# Patient Record
Sex: Male | Born: 1935
Health system: Southern US, Community
[De-identification: ages and names within clinical notes are randomized; demographics above are authoritative.]

## PROBLEM LIST (undated history)

## (undated) DIAGNOSIS — Z87898 Personal history of other specified conditions: Secondary | ICD-10-CM

## (undated) DIAGNOSIS — Z8781 Personal history of (healed) traumatic fracture: Secondary | ICD-10-CM

## (undated) DIAGNOSIS — I872 Venous insufficiency (chronic) (peripheral): Secondary | ICD-10-CM

## (undated) DIAGNOSIS — E538 Deficiency of other specified B group vitamins: Secondary | ICD-10-CM

## (undated) DIAGNOSIS — M1990 Unspecified osteoarthritis, unspecified site: Secondary | ICD-10-CM

## (undated) DIAGNOSIS — C61 Malignant neoplasm of prostate: Secondary | ICD-10-CM

## (undated) DIAGNOSIS — C4491 Basal cell carcinoma of skin, unspecified: Secondary | ICD-10-CM

## (undated) DIAGNOSIS — I700 Atherosclerosis of aorta: Secondary | ICD-10-CM

## (undated) DIAGNOSIS — M40203 Unspecified kyphosis, cervicothoracic region: Secondary | ICD-10-CM

## (undated) DIAGNOSIS — K635 Polyp of colon: Secondary | ICD-10-CM

## (undated) DIAGNOSIS — K5909 Other constipation: Secondary | ICD-10-CM

## (undated) HISTORY — DX: Polyp of colon: K63.5

## (undated) HISTORY — PX: FRACTURE SURGERY: SHX138

## (undated) HISTORY — DX: Malignant neoplasm of prostate: C61

## (undated) HISTORY — PX: CATARACT EXTRACTION: SUR2

## (undated) HISTORY — DX: Unspecified osteoarthritis, unspecified site: M19.90

## (undated) HISTORY — PX: COLONOSCOPY: SHX174

## (undated) HISTORY — DX: Atherosclerosis of aorta: I70.0

## (undated) HISTORY — DX: Venous insufficiency (chronic) (peripheral): I87.2

## (undated) HISTORY — DX: Personal history of other specified conditions: Z87.898

## (undated) HISTORY — PX: OTHER SURGICAL HISTORY: SHX169

## (undated) HISTORY — DX: Personal history of (healed) traumatic fracture: Z87.81

## (undated) HISTORY — DX: Deficiency of other specified B group vitamins: E53.8

## (undated) HISTORY — DX: Other constipation: K59.09

---

## 1994-02-24 DIAGNOSIS — C61 Malignant neoplasm of prostate: Secondary | ICD-10-CM

## 1994-02-24 HISTORY — DX: Malignant neoplasm of prostate: C61

## 1994-02-24 HISTORY — PX: PROSTATE SURGERY: SHX751

## 1997-02-24 HISTORY — PX: TIBIA FRACTURE SURGERY: SHX806

## 2002-02-24 DIAGNOSIS — C4491 Basal cell carcinoma of skin, unspecified: Secondary | ICD-10-CM

## 2002-02-24 HISTORY — DX: Basal cell carcinoma of skin, unspecified: C44.91

## 2002-02-24 HISTORY — PX: BASAL CELL CARCINOMA EXCISION: SHX1214

## 2003-02-25 HISTORY — PX: JOINT REPLACEMENT: SHX530

## 2008-01-04 ENCOUNTER — Encounter: Payer: Self-pay | Admitting: Internal Medicine

## 2008-08-24 ENCOUNTER — Encounter: Payer: Self-pay | Admitting: Internal Medicine

## 2008-08-24 ENCOUNTER — Ambulatory Visit: Payer: Self-pay | Admitting: Internal Medicine

## 2008-09-05 DIAGNOSIS — Z860100 Personal history of colon polyps, unspecified: Secondary | ICD-10-CM | POA: Insufficient documentation

## 2008-09-05 DIAGNOSIS — M810 Age-related osteoporosis without current pathological fracture: Secondary | ICD-10-CM

## 2008-09-05 DIAGNOSIS — M159 Polyosteoarthritis, unspecified: Secondary | ICD-10-CM | POA: Insufficient documentation

## 2008-09-07 ENCOUNTER — Encounter: Payer: Self-pay | Admitting: Internal Medicine

## 2008-09-07 ENCOUNTER — Ambulatory Visit: Payer: Self-pay | Admitting: Internal Medicine

## 2008-09-07 DIAGNOSIS — K5909 Other constipation: Secondary | ICD-10-CM

## 2008-09-07 DIAGNOSIS — I872 Venous insufficiency (chronic) (peripheral): Secondary | ICD-10-CM

## 2008-09-12 ENCOUNTER — Encounter: Payer: Self-pay | Admitting: Internal Medicine

## 2008-09-12 ENCOUNTER — Ambulatory Visit: Payer: Self-pay | Admitting: Internal Medicine

## 2008-09-24 ENCOUNTER — Ambulatory Visit: Payer: Self-pay | Admitting: Internal Medicine

## 2008-11-30 ENCOUNTER — Ambulatory Visit: Payer: Self-pay | Admitting: Internal Medicine

## 2008-11-30 ENCOUNTER — Encounter: Payer: Self-pay | Admitting: Internal Medicine

## 2008-12-11 ENCOUNTER — Ambulatory Visit: Payer: Self-pay | Admitting: Internal Medicine

## 2008-12-13 ENCOUNTER — Encounter: Payer: Self-pay | Admitting: Internal Medicine

## 2008-12-13 ENCOUNTER — Ambulatory Visit: Payer: Self-pay | Admitting: Internal Medicine

## 2008-12-13 DIAGNOSIS — M1710 Unilateral primary osteoarthritis, unspecified knee: Secondary | ICD-10-CM

## 2008-12-25 ENCOUNTER — Ambulatory Visit: Payer: Self-pay | Admitting: Internal Medicine

## 2009-01-24 HISTORY — PX: VOLVULUS REDUCTION: SHX425

## 2009-02-04 ENCOUNTER — Inpatient Hospital Stay: Payer: Self-pay | Admitting: General Surgery

## 2009-02-04 ENCOUNTER — Ambulatory Visit: Payer: Self-pay | Admitting: Internal Medicine

## 2009-02-05 ENCOUNTER — Encounter: Payer: Self-pay | Admitting: Internal Medicine

## 2009-02-08 ENCOUNTER — Telehealth: Payer: Self-pay | Admitting: Internal Medicine

## 2009-02-15 ENCOUNTER — Ambulatory Visit: Payer: Self-pay | Admitting: Internal Medicine

## 2009-02-19 ENCOUNTER — Ambulatory Visit: Payer: Self-pay | Admitting: Internal Medicine

## 2009-02-24 ENCOUNTER — Ambulatory Visit: Payer: Self-pay | Admitting: Internal Medicine

## 2009-02-26 ENCOUNTER — Encounter: Payer: Self-pay | Admitting: Internal Medicine

## 2009-03-13 ENCOUNTER — Ambulatory Visit: Payer: Self-pay | Admitting: Internal Medicine

## 2009-03-21 ENCOUNTER — Telehealth: Payer: Self-pay | Admitting: Internal Medicine

## 2009-03-22 ENCOUNTER — Encounter: Payer: Self-pay | Admitting: Internal Medicine

## 2009-03-22 ENCOUNTER — Ambulatory Visit: Payer: Self-pay | Admitting: Internal Medicine

## 2009-03-22 DIAGNOSIS — C61 Malignant neoplasm of prostate: Secondary | ICD-10-CM

## 2009-03-27 ENCOUNTER — Ambulatory Visit: Payer: Self-pay | Admitting: Internal Medicine

## 2009-03-29 ENCOUNTER — Encounter: Payer: Self-pay | Admitting: Internal Medicine

## 2009-03-29 ENCOUNTER — Ambulatory Visit: Payer: Self-pay | Admitting: Internal Medicine

## 2009-03-30 ENCOUNTER — Ambulatory Visit: Payer: Self-pay | Admitting: Internal Medicine

## 2009-04-05 ENCOUNTER — Encounter: Payer: Self-pay | Admitting: Internal Medicine

## 2009-06-25 ENCOUNTER — Telehealth: Payer: Self-pay | Admitting: Internal Medicine

## 2009-07-12 ENCOUNTER — Ambulatory Visit: Payer: Self-pay | Admitting: Internal Medicine

## 2009-07-12 ENCOUNTER — Encounter: Payer: Self-pay | Admitting: Internal Medicine

## 2009-08-24 ENCOUNTER — Ambulatory Visit: Payer: Self-pay | Admitting: Internal Medicine

## 2009-09-10 ENCOUNTER — Ambulatory Visit: Payer: Self-pay | Admitting: Internal Medicine

## 2009-09-10 ENCOUNTER — Encounter: Payer: Self-pay | Admitting: Internal Medicine

## 2009-09-11 LAB — PSA

## 2009-09-24 ENCOUNTER — Ambulatory Visit: Payer: Self-pay | Admitting: Internal Medicine

## 2009-10-11 ENCOUNTER — Ambulatory Visit: Payer: Self-pay | Admitting: Internal Medicine

## 2009-10-11 ENCOUNTER — Encounter: Payer: Self-pay | Admitting: Internal Medicine

## 2009-12-10 ENCOUNTER — Encounter: Payer: Self-pay | Admitting: Internal Medicine

## 2009-12-11 ENCOUNTER — Ambulatory Visit: Payer: Self-pay | Admitting: Internal Medicine

## 2009-12-11 ENCOUNTER — Telehealth: Payer: Self-pay | Admitting: Internal Medicine

## 2009-12-12 LAB — PSA

## 2009-12-13 ENCOUNTER — Ambulatory Visit: Payer: Self-pay | Admitting: Internal Medicine

## 2009-12-13 ENCOUNTER — Encounter: Payer: Self-pay | Admitting: Internal Medicine

## 2009-12-13 DIAGNOSIS — E538 Deficiency of other specified B group vitamins: Secondary | ICD-10-CM

## 2009-12-25 ENCOUNTER — Ambulatory Visit: Payer: Self-pay | Admitting: Internal Medicine

## 2010-02-11 ENCOUNTER — Encounter: Payer: Self-pay | Admitting: Internal Medicine

## 2010-02-14 ENCOUNTER — Encounter: Payer: Self-pay | Admitting: Internal Medicine

## 2010-02-14 DIAGNOSIS — M549 Dorsalgia, unspecified: Secondary | ICD-10-CM | POA: Insufficient documentation

## 2010-02-20 ENCOUNTER — Encounter: Payer: Self-pay | Admitting: Family Medicine

## 2010-02-22 ENCOUNTER — Telehealth: Payer: Self-pay | Admitting: Family Medicine

## 2010-02-23 ENCOUNTER — Ambulatory Visit: Payer: Self-pay | Admitting: Family Medicine

## 2010-02-23 ENCOUNTER — Encounter: Payer: Self-pay | Admitting: Family Medicine

## 2010-02-23 ENCOUNTER — Telehealth: Payer: Self-pay | Admitting: Family Medicine

## 2010-03-05 ENCOUNTER — Encounter: Payer: Self-pay | Admitting: Family Medicine

## 2010-03-05 ENCOUNTER — Ambulatory Visit: Payer: Self-pay | Admitting: Family Medicine

## 2010-03-07 ENCOUNTER — Encounter: Payer: Self-pay | Admitting: Internal Medicine

## 2010-03-07 DIAGNOSIS — I8310 Varicose veins of unspecified lower extremity with inflammation: Secondary | ICD-10-CM | POA: Insufficient documentation

## 2010-03-13 ENCOUNTER — Ambulatory Visit: Payer: Self-pay | Admitting: Internal Medicine

## 2010-03-14 LAB — PSA

## 2010-03-26 NOTE — Assessment & Plan Note (Signed)
Summary: [facility]   Vital Signs:  Patient profile:   77 year old male Weight:      159 pounds Temp:     98.1 degrees F Pulse rate:   72 / minute Resp:     18 per minute BP sitting:   118 / 60 CC: Assisted living follow up   History of Present Illness: Had volvulus repair last month Several weeks in rehab and now back in AL apt No abdominal pain Appetite is good 1-2 stools per day No blood in stool No nausea or vomiting Not needing the percocet  Occ arthritis pain in hands and right knee ?steroid injection helpful at all He can feel things hitting in there  Still gets lupron for prostate cancer Treated by Dr Sherrlyn Hock on osteoporosis regimen as well  Allergies: No Known Drug Allergies  Past History:  Past medical, surgical, family and social histories (including risk factors) reviewed, and no changes noted (except as noted below).  Past Medical History: Reviewed history from 09/07/2008 and no changes required. Osteoporosis Prostate cancer, hx of-------------surgery then lupron shots Colonic polyps, hx of-----------------adenomatous Osteoarthritis Constipation--chronic Chronic venous insufficiency  Past Surgical History: Cataract surgery, both eyes- 2000 & 2003 Left knee replacement-2005 Colonoscopy-2003 & 2006 Carcinoma removed from left side of face- (BCC)--2004 Surgery on left leg, broken-1999 Prostate cancer surgery--1996 Volvulus repair--12/10  Family History: Reviewed history from 09/07/2008 and no changes required. Dad died of MI in 52's Mom died of leukemia  ~78 3 sisters---2 were half sisters No CAD, HTN DM strong in family (including son) No colon or prostate cancer  Social History: Reviewed history from 09/07/2008 and no changes required. 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 Has DNR and wishes to continue. Would  accept hospitalization.  Would not want tube feeding (09/07/2008)  Review of Systems  The patient denies chest pain, syncope, dyspnea on exertion, and abdominal pain.         sleeps okay--occ up helping wife  weight down from last AL visit but stable since transfer back here  Physical Exam  General:  alert and normal appearance.   Neck:  supple, no masses, no thyromegaly, no carotid bruits, and no cervical lymphadenopathy.   Lungs:  normal respiratory effort and normal breath sounds.   Heart:  normal rate, regular rhythm, no murmur, and no gallop.   Abdomen:  soft, non-tender, and normal bowel sounds.   Well healed midline lower abd incision Msk:  no joint tenderness and no joint swelling.   Extremities:  no edema Psych:  normally interactive, good eye contact, not anxious appearing, and not depressed appearing.     Impression & Recommendations:  Problem # 1:  INTESTINAL VOLVULUS (ICD-560.2) Assessment Improved has recovered well from the surgery eating fine--will watch weight needs to stay on miralax and probably metamucil also to decrease strain on bowels  Problem # 2:  OSTEOARTHRITIS (ICD-715.90) Assessment: Unchanged mild symptoms no meds in general   Problem # 3:  ADENOCARCINOMA, PROSTATE (ICD-185) Assessment: Comment Only still gets lupron shots as needed PSA staying down so hasn't needed lately  Problem # 4:  OSTEOPOROSIS (ICD-733.00) Assessment: Unchanged on appropriate regimen with high risk due to leuprolide Rx due for DEXA  His updated medication list for this problem includes:    Actonel 35 Mg Tabs (Risedronate sodium) .Marland Kitchen... Take 1 tablet by mouth each week    Calcium 600-d 600-400 Mg-unit Tabs (  Calcium carbonate-vitamin d) .Marland Kitchen... Take 2 by mouth once daily    Vitamin D 1000 Unit Tabs (Cholecalciferol) .Marland Kitchen... Take 1 by mouth once daily  Complete Medication List: 1)  Metamucil 30.9 % Powd (Psyllium) .... Take with full glass of water two times a day 2)   Miralax Powd (Polyethylene glycol 3350) .... Take with full glass of water two times a day 3)  Glucosamine-chondroitin 1500-1200 Mg/76ml Liqd (Glucosamine-chondroitin) .... Take 1 by mouth once daily 4)  Actonel 35 Mg Tabs (Risedronate sodium) .... Take 1 tablet by mouth each week 5)  Calcium 600-d 600-400 Mg-unit Tabs (Calcium carbonate-vitamin d) .... Take 2 by mouth once daily 6)  Vitamin D 1000 Unit Tabs (Cholecalciferol) .... Take 1 by mouth once daily 7)  Ciclopirox Olamine 0.77 % Crea (Ciclopirox olamine) .... Use 1 application daily between toes 8)  Hydrocortisone 2.5 % Oint (Hydrocortisone) .... Use 1 application daily on left ankle 9)  Px Enteric Aspirin 81 Mg Tbec (Aspirin) .... Take 1 by mouth once daily 10)  Eligard 22.5 Mg Kit (Leuprolide acetate (3 month)) .Marland Kitchen.. 1 injection every 6 months as needed  Patient Instructions: 1)  Will plan follow up here in 2-3 months Prescriptions: ACTONEL 35 MG TABS (RISEDRONATE SODIUM) take 1 tablet by mouth each week  #12 x 3   Entered and Authorized by:   Cindee Salt MD   Signed by:   Cindee Salt MD on 03/22/2009   Method used:   Handwritten   RxID:   [id number]

## 2010-03-26 NOTE — Assessment & Plan Note (Signed)
Summary: [facility]   Vital Signs:  Patient profile:   77 year old male Height:      73 inches Weight:      168 pounds BMI:     22.25 Temp:     97.9 degrees F Pulse rate:   68 / minute Resp:     18 per minute BP sitting:   98 / 58  History of Present Illness: Reviewed status with Carla Generally doing okay Busy with responsiblity of caring for ailing wife---she needs his help to stay here at assisted living BP running on low side He reports no dizziness or syncope  Had recent oncology eval no lupron this time No trouble voiding ----remains slow Nocturia x 2 in general  Some pain in fingers Limited grip -- esp on right Wants to try the piano again some--helps his dexterity RIght knee pain--not enough to need another cortisone injection Generally doesn't need meds  remains on the actonel and vitamin D Oncologist considering zometa     Allergies: No Known Drug Allergies  Past History:  Past medical, surgical, family and social histories (including risk factors) reviewed for relevance to current acute and chronic problems.  Past Medical History: Reviewed history from 09/07/2008 and no changes required. Osteoporosis Prostate cancer, hx of-------------surgery then lupron shots Colonic polyps, hx of-----------------adenomatous Osteoarthritis Constipation--chronic Chronic venous insufficiency  Past Surgical History: Reviewed history from 03/22/2009 and no changes required. Cataract surgery, both eyes- 2000 & 2003 Left knee replacement-2005 Colonoscopy-2003 & 2006 Carcinoma removed from left side of face- (BCC)--2004 Surgery on left leg, broken-1999 Prostate cancer surgery--1996 Volvulus repair--12/10  Family History: Reviewed history from 09/07/2008 and no changes required. Dad died of MI in 62's Mom died of leukemia  ~78 3 sisters---2 were half sisters No CAD, HTN DM strong in family (including son) No colon or prostate cancer  Social  History: Reviewed history from 09/07/2008 and no changes required. 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 Has DNR and wishes to continue. Would accept hospitalization.  Would not want tube feeding (09/07/2008)  Review of Systems       Appetite is fine weight is stable sleeps okay---feels he is sleeping lighter so he can get up to help wife as needed  If he has night problems, a little snack sometimes helps  Physical Exam  General:  alert and normal appearance.   Neck:  supple, no masses, no thyromegaly, no carotid bruits, and no cervical lymphadenopathy.   Lungs:  normal respiratory effort, no intercostal retractions, no accessory muscle use, and normal breath sounds.   Heart:  normal rate, regular rhythm, no murmur, and no gallop.   Abdomen:  soft and non-tender.   Msk:  no joint tenderness.  Moderate thckening of hand PIPs but not actively inflamedno edema Extremities:  No edema Psych:  normally interactive, good eye contact, not anxious appearing, and not depressed appearing.     Impression & Recommendations:  Problem # 1:  DEGENERATIVE JOINT DISEASE, BOTH KNEES, SEVERE (ICD-715.96) Assessment Improved helped by last cortisone shot hands are okay also  His updated medication list for this problem includes:    Px Enteric Aspirin 81 Mg Tbec (Aspirin) .Marland Kitchen... Take 1 by mouth once daily    Cyclobenzaprine Hcl 5 Mg Tabs (Cyclobenzaprine hcl) .Marland Kitchen... 1 tab at bedtime as needed for pain or spasm  Problem # 2:  ADENOCARCINOMA, PROSTATE (ICD-185) Assessment: Unchanged Dr Sherrlyn Hock managing no  recent lupron voids okay  Problem # 3:  OSTEOPOROSIS (ICD-733.00) Assessment: Unchanged stable on Rx  His updated medication list for this problem includes:    Actonel 35 Mg Tabs (Risedronate sodium) .Marland Kitchen... Take 1 tablet by mouth each week    Calcium 600-d 600-400 Mg-unit Tabs (Calcium  carbonate-vitamin d) .Marland Kitchen... Take 2 by mouth once daily    Vitamin D 1000 Unit Tabs (Cholecalciferol) .Marland Kitchen... Take 1 by mouth once daily  Problem # 4:  CONSTIPATION, CHRONIC (ICD-564.09) Assessment: Unchanged satisfied with regimen No changes needed  His updated medication list for this problem includes:    Metamucil 30.9 % Powd (Psyllium) .Marland Kitchen... Take with full glass of water two times a day    Miralax Powd (Polyethylene glycol 3350) .Marland Kitchen... Take with full glass of water two times a day  Complete Medication List: 1)  Metamucil 30.9 % Powd (Psyllium) .... Take with full glass of water two times a day 2)  Miralax Powd (Polyethylene glycol 3350) .... Take with full glass of water two times a day 3)  Glucosamine-chondroitin 1500-1200 Mg/20ml Liqd (Glucosamine-chondroitin) .... Take 1 by mouth once daily 4)  Actonel 35 Mg Tabs (Risedronate sodium) .... Take 1 tablet by mouth each week 5)  Calcium 600-d 600-400 Mg-unit Tabs (Calcium carbonate-vitamin d) .... Take 2 by mouth once daily 6)  Vitamin D 1000 Unit Tabs (Cholecalciferol) .... Take 1 by mouth once daily 7)  Ciclopirox Olamine 0.77 % Crea (Ciclopirox olamine) .... Use 1 application daily between toes 8)  Hydrocortisone 2.5 % Oint (Hydrocortisone) .... Use 1 application daily on left ankle 9)  Px Enteric Aspirin 81 Mg Tbec (Aspirin) .... Take 1 by mouth once daily 10)  Eligard 22.5 Mg Kit (Leuprolide acetate (3 month)) .Marland Kitchen.. 1 injection every 6 months as needed 11)  Cyclobenzaprine Hcl 5 Mg Tabs (Cyclobenzaprine hcl) .Marland Kitchen.. 1 tab at bedtime as needed for pain or spasm  Patient Instructions: 1)  Will plan follow up in 2-3 months

## 2010-03-26 NOTE — Assessment & Plan Note (Signed)
Summary: [facility]   Vital Signs:  Patient profile:   77 year old male Weight:      164 pounds Temp:     97.6 degrees F Pulse rate:   68 / minute Resp:     16 per minute BP sitting:   104 / 60 CC: [facility] follow up   History of Present Illness: Reviewed status with RN and resident assistenat  No new concerns  Having more trouble with right knee did have some relief from cortisone shot and requests another one today No other major worrisome joints Does have stiffness in hands and trouble making a fist--doesn't limit him functionally but only occ drops something. Hopes to get back to working on the piano  wife back in health care She has adjusted but hopes to get back here soon He has done okay with this also He brings her over for lunch and supper and that helps to normalize things for them somewhat  PSA has been staying done hasn't needed lupron in some time  Thinks he has been on actonel well more than 5 years continues on calcium and vitamin D still walks fairly well tries to keep up with exercise as well---stretching, bicycle (though that didn't feel great on knee)  Bowels have been "best they ever had" of late  No problems with edema no calf pain  Allergies: No Known Drug Allergies  Past History:  Past medical, surgical, family and social histories (including risk factors) reviewed for relevance to current acute and chronic problems.  Past Medical History: Reviewed history from 09/07/2008 and no changes required. Osteoporosis Prostate cancer, hx of-------------surgery then lupron shots Colonic polyps, hx of-----------------adenomatous Osteoarthritis Constipation--chronic Chronic venous insufficiency  Past Surgical History: Reviewed history from 03/22/2009 and no changes required. Cataract surgery, both eyes- 2000 & 2003 Left knee replacement-2005 Colonoscopy-2003 & 2006 Carcinoma removed from left side of face-  (BCC)--2004 Surgery on left leg, broken-1999 Prostate cancer surgery--1996 Volvulus repair--12/10  Family History: Reviewed history from 09/07/2008 and no changes required. Dad died of MI in 18's Mom died of leukemia  ~78 3 sisters---2 were half sisters No CAD, HTN DM strong in family (including son) No colon or prostate cancer  Social History: Reviewed history from 09/07/2008 and no changes required. 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 Has DNR and wishes to continue. Would accept hospitalization.  Would not want tube feeding (09/07/2008)  Review of Systems       appetite is "voracious" weight up 5# since last visit sleeps fairly well---occ has hot flashes Given Rx for venlafaxine for night for these but he had "strange night of sleep" so he never continued. They don't seem to be bad enough to continue Rx Increased nocturia lately but able to get back to sleep  Physical Exam  General:  alert and normal appearance.   Neck:  supple, no masses, no thyromegaly, no carotid bruits, and no cervical lymphadenopathy.   Lungs:  normal respiratory effort and normal breath sounds.   Heart:  normal rate, regular rhythm, no murmur, and no gallop.   Abdomen:  soft and non-tender.   Msk:  no joint swelling.   Right knee is heavily thickened  Fair ROM No apparent effusion Pulses:  faint in feet Extremities:  no sig edema Skin:  no rashes and no suspicious lesions.   Psych:  normally interactive, good eye contact, not anxious appearing,  and not depressed appearing.     Impression & Recommendations:  Problem # 1:  ADENOCARCINOMA, PROSTATE (ICD-185) Assessment Unchanged doing well hasn't needed lupron in some time  Problem # 2:  OSTEOPOROSIS (ICD-733.00) Assessment: Unchanged discussed concerns about prolonged bisphosphonate use he believes he has been on it much longer than  5 years will stop the actonel  His updated medication list for this problem includes:    Actonel 35 Mg Tabs (Risedronate sodium) .Marland Kitchen... Take 1 tablet by mouth each week    Calcium 600-d 600-400 Mg-unit Tabs (Calcium carbonate-vitamin d) .Marland Kitchen... Take 2 by mouth once daily    Vitamin D 1000 Unit Tabs (Cholecalciferol) .Marland Kitchen... Take 1 by mouth once daily  Problem # 3:  CONSTIPATION, CHRONIC (ICD-564.09) Assessment: Improved very satisfied with current regimen  His updated medication list for this problem includes:    Metamucil 30.9 % Powd (Psyllium) .Marland Kitchen... Take with full glass of water two times a day    Miralax Powd (Polyethylene glycol 3350) .Marland Kitchen... Take with full glass of water two times a day  Problem # 4:  VENOUS INSUFFICIENCY, CHRONIC (ICD-459.81) Assessment: Improved swelling better of late continues to wear support socks and tries to keep legs elevated when he can  Problem # 5:  DEGENERATIVE JOINT DISEASE, BOTH KNEES, SEVERE (ICD-715.96) Assessment: Comment Only Procedure sterile prep superomedial approach used 2cc 1% plain lido used for local moderate arthritc spurring encountered but not as bad as lateral approach 40mg  kenalog with 5 cc 1% lidocaine injected without difficulty he tolerated this well  Complete Medication List: 1)  Metamucil 30.9 % Powd (Psyllium) .... Take with full glass of water two times a day 2)  Miralax Powd (Polyethylene glycol 3350) .... Take with full glass of water two times a day 3)  Glucosamine-chondroitin 1500-1200 Mg/21ml Liqd (Glucosamine-chondroitin) .... Take 1 by mouth once daily 4)  Actonel 35 Mg Tabs (Risedronate sodium) .... Take 1 tablet by mouth each week 5)  Calcium 600-d 600-400 Mg-unit Tabs (Calcium carbonate-vitamin d) .... Take 2 by mouth once daily 6)  Vitamin D 1000 Unit Tabs (Cholecalciferol) .... Take 1 by mouth once daily 7)  Ciclopirox Olamine 0.77 % Crea (Ciclopirox olamine) .... Use 1 application daily between toes 8)   Hydrocortisone 2.5 % Oint (Hydrocortisone) .... Use 1 application daily on left ankle 9)  Px Enteric Aspirin 81 Mg Tbec (Aspirin) .... Take 1 by mouth once daily 10)  Eligard 22.5 Mg Kit (Leuprolide acetate (3 month)) .Marland Kitchen.. 1 injection every 6 months as needed 11)  Cyclobenzaprine Hcl 5 Mg Tabs (Cyclobenzaprine hcl) .Marland Kitchen.. 1 tab at bedtime as needed for pain or spasm  Patient Instructions: 1)  Will Plan follow up visit in 2-3 months

## 2010-03-26 NOTE — Letter (Signed)
Summary: [facility]  [facility]   Imported By: Lanelle Bal 04/18/2009 13:04:27  _____________________________________________________________________  External Attachment:    Type:   Image     Comment:   External Document  Appended Document: [facility] doing fine 2 months after volvulus repair follow up as needed

## 2010-03-26 NOTE — Letter (Signed)
Summary: [facility]  [facility]   Imported By: Maryln Gottron 10/05/2009 13:32:11  _____________________________________________________________________  External Attachment:    Type:   Image     Comment:   External Document  Appended Document: [facility] will repeat PSA and give more lupron if going up May need zometa if needs more androgen blockade

## 2010-03-26 NOTE — Progress Notes (Signed)
Summary: Bone density and f/u appt  Phone Note From Other Clinic Call back at [phone]   Caller: [facility] Call For: Dr. Alphonsus Sias Summary of Call: Revonda Standard called on behalf of Dr. Sherrlyn Hock.  Dr. Sherrlyn Hock would like Dr. Alphonsus Sias to refer patient to have a bone density to evaluate patient's osteoporosis and schedule a f/u appt with Dr. Alphonsus Sias.  Please advise. Initial call taken by: Linde Gillis CMA [facility]),  March 21, 2009 4:58 PM  Follow-up for Phone Call        I am seeing him tomorrow at [facility]. I will talk to him about the bone density Follow-up by: Cindee Salt MD,  March 21, 2009 7:03 PM  Additional Follow-up for Phone Call Additional follow up Details #1::        left message on voicemail for [facility] with results Additional Follow-up by: Mervin Hack CMA [facility]),  March 22, 2009 8:36 AM

## 2010-03-26 NOTE — Progress Notes (Signed)
Summary: b12 defficiency  Phone Note Other Incoming Call back at [phone]   Caller: Fulton Mole with [phone] Summary of Call: Patient was at the cancer center today and they checked his b12 level and it camed back at 177. They are asking if you would be willing to treat him for his b12 defficiency. Please advise.  Fulton Mole is aware that you are out of office for the afternoon.  Initial call taken by: Melody Comas,  December 11, 2009 3:36 PM  Follow-up for Phone Call        I will add this to his treatment at [facility]  Please call them to let them know I will handle this Follow-up by: Cindee Salt MD,  December 11, 2009 7:44 PM  Additional Follow-up for Phone Call Additional follow up Details #1::        left message on Alice machine that we will treat for the b-12 Additional Follow-up by: DeShannon Katrinka Blazing CMA [facility]),  December 12, 2009 9:36 AM

## 2010-03-26 NOTE — Miscellaneous (Signed)
Summary: Orders Update   Clinical Lists Changes  Orders: Added new Referral order of Radiology Referral (Radiology) - Signed 

## 2010-03-26 NOTE — Assessment & Plan Note (Signed)
Summary: [facility]   Vital Signs:  Patient profile:   77 year old male Weight:      168 pounds Temp:     98.4 degrees F Pulse rate:   84 / minute Resp:     20 per minute BP sitting:   106 / 60  History of Present Illness: Having ongoing knee pain requests cortisone shot again hard to walk due to pain also with considerable caregiving needs for his wife  Phone call from cancer center had low vitamin B12 levels they request we start Rx for this here  parenteral rx is needed given probably achlorhydria  Allergies: No Known Drug Allergies  Past History:  Past medical, surgical, family and social histories (including risk factors) reviewed for relevance to current acute and chronic problems.  Past Medical History: Osteoporosis Prostate cancer, hx of-------------surgery then lupron shots Colonic polyps, hx of-----------------adenomatous Osteoarthritis Constipation--chronic Chronic venous insufficiency Vitamin  B12 deficiency  Past Surgical History: Reviewed history from 03/22/2009 and no changes required. Cataract surgery, both eyes- 2000 & 2003 Left knee replacement-2005 Colonoscopy-2003 & 2006 Carcinoma removed from left side of face- (BCC)--2004 Surgery on left leg, broken-1999 Prostate cancer surgery--1996 Volvulus repair--12/10  Family History: Reviewed history from 09/07/2008 and no changes required. Dad died of MI in 73's Mom died of leukemia  ~78 3 sisters---2 were half sisters No CAD, HTN DM strong in family (including son) No colon or prostate cancer  Social History: Reviewed history from 09/07/2008 and no changes required. 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 Has DNR and wishes to continue. Would accept hospitalization.  Would not want tube feeding (09/07/2008)  Review of Systems       appetite  okay weight  stable  Physical Exam  General:  alert and normal appearance.   Msk:  sig hypertrophic changes of both knees No sig effusions   Impression & Recommendations:  Problem # 1:  VITAMIN B12 DEFICIENCY (ICD-266.2) Assessment New will start B12 shots per recommendation of hematologist  Problem # 2:  DEGENERATIVE JOINT DISEASE, BOTH KNEES, SEVERE (ICD-715.96) Assessment: Comment Only Procedure  sterile prep to superomedial aspect of right knee 2cc 2% plain lido local 7cc 2% lido and 40mg  kenalog injected without incident marked hypertrophic spurring limited access again but did seem to get into the joint space  His updated medication list for this problem includes:    Px Enteric Aspirin 81 Mg Tbec (Aspirin) .Marland Kitchen... Take 1 by mouth once daily    Cyclobenzaprine Hcl 5 Mg Tabs (Cyclobenzaprine hcl) .Marland Kitchen... 1 tab at bedtime as needed for pain or spasm  Complete Medication List: 1)  Metamucil 30.9 % Powd (Psyllium) .... Take with full glass of water two times a day 2)  Miralax Powd (Polyethylene glycol 3350) .... Take with full glass of water two times a day 3)  Glucosamine-chondroitin 1500-1200 Mg/41ml Liqd (Glucosamine-chondroitin) .... Take 1 by mouth once daily 4)  Actonel 35 Mg Tabs (Risedronate sodium) .... Take 1 tablet by mouth each week 5)  Calcium 600-d 600-400 Mg-unit Tabs (Calcium carbonate-vitamin d) .... Take 2 by mouth once daily 6)  Vitamin D 1000 Unit Tabs (Cholecalciferol) .... Take 1 by mouth once daily 7)  Ciclopirox Olamine 0.77 % Crea (Ciclopirox olamine) .... Use 1 application daily between toes 8)  Hydrocortisone 2.5 % Oint (Hydrocortisone) .... Use 1 application daily on left ankle 9)  Px Enteric Aspirin 81 Mg Tbec (  Aspirin) .... Take 1 by mouth once daily 10)  Eligard 22.5 Mg Kit (Leuprolide acetate (3 month)) .Marland Kitchen.. 1 injection every 6 months as needed 11)  Cyclobenzaprine Hcl 5 Mg Tabs (Cyclobenzaprine hcl) .Marland Kitchen.. 1 tab at bedtime as needed for pain or spasm  Patient  Instructions: 1)  will keep regular follow up

## 2010-03-26 NOTE — Letter (Signed)
Summary: [facility]  [facility]   Imported By: Lanelle Bal 03/02/2009 12:25:47  _____________________________________________________________________  External Attachment:    Type:   Image     Comment:   External Document  Appended Document: [facility] doing well post op volvulus repair

## 2010-03-26 NOTE — Progress Notes (Signed)
  Phone Note Other Incoming   Caller: Teacher, English as a foreign language of Call: having some sleep problems with pain cyclobenzaprine really helped in past gave order to use 5mg  at bedtime as needed and monitor for any side effects Initial call taken by: Cindee Salt MD,  Jun 25, 2009 1:28 PM    New/Updated Medications: CYCLOBENZAPRINE HCL 5 MG TABS (CYCLOBENZAPRINE HCL) 1 tab at bedtime as needed for pain or spasm Prescriptions: CYCLOBENZAPRINE HCL 5 MG TABS (CYCLOBENZAPRINE HCL) 1 tab at bedtime as needed for pain or spasm  #30 x 1   Entered and Authorized by:   Cindee Salt MD   Signed by:   Cindee Salt MD on 06/25/2009   Method used:   Faxed to ...       [facility] [employer] (mail-order)       [address]       [place], [place]  [address]       Ph: [phone]       Fax: [phone]   RxID:   [id number]

## 2010-03-27 ENCOUNTER — Ambulatory Visit: Payer: Self-pay | Admitting: Internal Medicine

## 2010-03-28 ENCOUNTER — Encounter: Payer: Self-pay | Admitting: Internal Medicine

## 2010-03-28 NOTE — Assessment & Plan Note (Signed)
Summary: [facility] visit   Vital Signs:  Patient profile:   77 year old male Weight:      168 pounds Pulse rate:   63 / minute Resp:     18 per minute BP sitting:   112 / 58  History of Present Illness: reviewed status with Chiropractor and pt's daughter. has continued with wife's care still hasn't hired anyone to help-though staff help her with a shower twice a week  Examined by Dr. Alphonsus Sias last week forincreased back pain for about 2 weeks.   No known injury--but may have twisted it helping wife with transferring  No fall Has tried heating pad which has helped Using tylenol and tramadol does have some worsening leg weakness. Notes more pain when voiding and feels he cannot have a BM withot an enema.    Prostate cancer hasn't needed rx of late still gets hot flashes Last PSA 0.7   Current Medications (verified): 1)  Metamucil 30.9 % Powd (Psyllium) .... Take With Full Glass of Water Two Times A Day 2)  Miralax  Powd (Polyethylene Glycol 3350) .... Take With Full Glass of Water Two Times A Day 3)  Glucosamine-Chondroitin 1500-1200 Mg/30ml Liqd (Glucosamine-Chondroitin) .... Take 1 By Mouth Once Daily 4)  Actonel 35 Mg Tabs (Risedronate Sodium) .... Take 1 Tablet By Mouth Each Week 5)  Calcium 600-D 600-400 Mg-Unit Tabs (Calcium Carbonate-Vitamin D) .... Take 2 By Mouth Once Daily 6)  Vitamin D 1000 Unit Tabs (Cholecalciferol) .... Take 1 By Mouth Once Daily 7)  Px Enteric Aspirin 81 Mg Tbec (Aspirin) .... Take 1 By Mouth Once Daily 8)  Eligard 22.5 Mg Kit (Leuprolide Acetate (3 Month)) .Marland Kitchen.. 1 Injection Every 6 Months As Needed 9)  Cyclobenzaprine Hcl 5 Mg Tabs (Cyclobenzaprine Hcl) .Marland Kitchen.. 1 Tab At Bedtime As Needed For Pain or Spasm 10)  Tramadol Hcl 50 Mg Tabs (Tramadol Hcl) .... Take 1 By Mouth Three Times A Day As Needed Severe Pain 11)  Cyanocobalamin 1000 Mcg/ml Soln (Cyanocobalamin) .Marland Kitchen.. 1 Ml Intramuscular Monthly  Allergies (verified): No Known Drug  Allergies  Past History:  Past Medical History: Last updated: 12/13/2009 Osteoporosis Prostate cancer, hx of-------------surgery then lupron shots Colonic polyps, hx of-----------------adenomatous Osteoarthritis Constipation--chronic Chronic venous insufficiency Vitamin  B12 deficiency  Past Surgical History: Last updated: 03/22/2009 Cataract surgery, both eyes- 2000 & 2003 Left knee replacement-2005 Colonoscopy-2003 & 2006 Carcinoma removed from left side of face- (BCC)--2004 Surgery on left leg, broken-1999 Prostate cancer surgery--1996 Volvulus repair--12/10  Family History: Last updated: 05-Oct-2008 Dad died of MI in 61's Mom died of leukemia  ~78 3 sisters---2 were half sisters No CAD, HTN DM strong in family (including son) No colon or prostate cancer  Social History: Last updated: 10-05-08 Married--3 children 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 Has DNR and wishes to continue. Would accept hospitalization.  Would not want tube feeding (10/05/2008)  Risk Factors: Smoking Status: never (2008-10-05)  Review of Systems      See HPI General:  Denies fever and loss of appetite. GI:  Denies abdominal pain, bloody stools, dark tarry stools, gas, indigestion, loss of appetite, nausea, and vomiting.  Physical Exam  General:  alert and normal appearance.   Eyes:  pupils equal and pupils round.   Mouth:  no erythema and no lesions.   Abdomen:  soft and non-tender.   normal bowel sounds.   Msk:  no back tenderness or  spine tenderness. Pain is lateral in right lumbar area SLR is negative bilaterally, neg fabers bilaterally Neurologic:  mild general leg weakness Able to ambulate just with cane but uses rollator also Mild decreased strength focally Psych:  normally interactive, good eye contact, not anxious appearing, and not depressed appearing.     Impression &  Recommendations:  Problem # 1:  BACK PAIN (ICD-724.5) Assessment Deteriorated Now with indications of obstibation and urinary retention. Will get MRI of lumbar spine and pelvis- history complicated by prior abdominal colon surgery and prostate CA. His updated medication list for this problem includes:    Px Enteric Aspirin 81 Mg Tbec (Aspirin) .Marland Kitchen... Take 1 by mouth once daily    Cyclobenzaprine Hcl 5 Mg Tabs (Cyclobenzaprine hcl) .Marland Kitchen... 1 tab at bedtime as needed for pain or spasm    Tramadol Hcl 50 Mg Tabs (Tramadol hcl) .Marland Kitchen... Take 1 by mouth three times a day as needed severe pain  Complete Medication List: 1)  Metamucil 30.9 % Powd (Psyllium) .... Take with full glass of water two times a day 2)  Miralax Powd (Polyethylene glycol 3350) .... Take with full glass of water two times a day 3)  Glucosamine-chondroitin 1500-1200 Mg/71ml Liqd (Glucosamine-chondroitin) .... Take 1 by mouth once daily 4)  Actonel 35 Mg Tabs (Risedronate sodium) .... Take 1 tablet by mouth each week 5)  Calcium 600-d 600-400 Mg-unit Tabs (Calcium carbonate-vitamin d) .... Take 2 by mouth once daily 6)  Vitamin D 1000 Unit Tabs (Cholecalciferol) .... Take 1 by mouth once daily 7)  Px Enteric Aspirin 81 Mg Tbec (Aspirin) .... Take 1 by mouth once daily 8)  Eligard 22.5 Mg Kit (Leuprolide acetate (3 month)) .Marland Kitchen.. 1 injection every 6 months as needed 9)  Cyclobenzaprine Hcl 5 Mg Tabs (Cyclobenzaprine hcl) .Marland Kitchen.. 1 tab at bedtime as needed for pain or spasm 10)  Tramadol Hcl 50 Mg Tabs (Tramadol hcl) .... Take 1 by mouth three times a day as needed severe pain 11)  Cyanocobalamin 1000 Mcg/ml Soln (Cyanocobalamin) .Marland Kitchen.. 1 ml intramuscular monthly

## 2010-03-28 NOTE — Progress Notes (Signed)
Summary: order for mri   Phone Note Other Incoming Call back at [phone]   Caller: Terri w/ [facility]  Summary of Call: Patient is going tmorrow for MRI. They need an order faxed to [phone].  Initial call taken by: Melody Comas,  February 22, 2010 11:12 AM  Follow-up for Phone Call        we faxed this yesterday but we can refax again. Ruthe Mannan MD  February 22, 2010 11:31 AM  Order faxed to Terri at [phone].  Follow-up by: Linde Gillis CMA Duncan Dull),  February 22, 2010 11:44 AM

## 2010-03-28 NOTE — Letter (Signed)
Summary: Fax  Regarding MRI/[facility]  Fax  Regarding MRI/[facility]   Imported By: Lanelle Bal 03/06/2010 12:27:38  _____________________________________________________________________  External Attachment:    Type:   Image     Comment:   External Document

## 2010-03-28 NOTE — Miscellaneous (Signed)
Summary: TRAMADOL   Clinical Lists Changes  Medications: Added new medication of TRAMADOL HCL 50 MG TABS (TRAMADOL HCL) Take 1 by mouth three times a day as needed severe pain

## 2010-03-28 NOTE — Progress Notes (Signed)
Summary: Care Update/[facility]   Imported By: Lanelle Bal 02/19/2010 15:41:03  _____________________________________________________________________  External Attachment:    Type:   Image     Comment:   External Document

## 2010-03-28 NOTE — Assessment & Plan Note (Signed)
Summary: [facility]   Vital Signs:  Patient profile:   77 year old male Weight:      165 pounds Temp:     97.3 degrees F Pulse rate:   66 / minute Resp:     18 per minute BP sitting:   102 / 58  History of Present Illness: Visit set up for right knee injection  Now he and staff are also concerned about increased redness in lower left calf No skin breaks No fever He is esp concerned about darker area lateral to malleolus He uses aquaphor lotion and occ hydrocortisone cream No calf pain  No CP No SOB  wears support socks daily Not great about keeping his legs up when sitting though  Allergies: No Known Drug Allergies  Past History:  Past medical, surgical, family and social histories (including risk factors) reviewed for relevance to current acute and chronic problems.  Past Medical History: Reviewed history from 12/13/2009 and no changes required. Osteoporosis Prostate cancer, hx of-------------surgery then lupron shots Colonic polyps, hx of-----------------adenomatous Osteoarthritis Constipation--chronic Chronic venous insufficiency Vitamin  B12 deficiency  Past Surgical History: Reviewed history from 03/22/2009 and no changes required. Cataract surgery, both eyes- 2000 & 2003 Left knee replacement-2005 Colonoscopy-2003 & 2006 Carcinoma removed from left side of face- (BCC)--2004 Surgery on left leg, broken-1999 Prostate cancer surgery--1996 Volvulus repair--12/10  Family History: Reviewed history from 09/07/2008 and no changes required. Dad died of MI in 30's Mom died of leukemia  ~78 3 sisters---2 were half sisters No CAD, HTN DM strong in family (including son) No colon or prostate cancer  Social History: Reviewed history from 09/07/2008 and no changes required. 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 Has DNR and wishes to  continue. Would accept hospitalization.  Would not want tube feeding (09/07/2008)  Review of Systems       ongoing right knee pain asks for early injection due to wearing off sooner  Physical Exam  General:  alert and normal appearance.   Msk:  Usuall thickening of right knee No apparent effusion Extremities:  No sig edema No calf tenderness Homan's sign absent Skin:  diffuse but mild redness in left distal calf some more brawny changes along lateral malleolus No skin breaks No inflammation   Impression & Recommendations:  Problem # 1:  VARICOSE VEINS LOWER EXTREMITIES W/INFLAMMATION (ICD-454.1) Assessment Deteriorated mild irritation but no infection advised him to continue aquaphor but cut back on steroid cream (that can cause changes) keep feet up continue support socks  Problem # 2:  DEGENERATIVE JOINT DISEASE, BOTH KNEES, SEVERE (ICD-715.96) Assessment: Deteriorated Procedure Sterile prep medial right knee 2cc 2% plain lido local Easy entry into joint 40mg  kenalog/8cc 2% lidocaine instilled with instant improvement in pain  Discussed ongoing care  Complete Medication List: 1)  Metamucil 30.9 % Powd (Psyllium) .... Take with full glass of water two times a day 2)  Miralax Powd (Polyethylene glycol 3350) .... Take with full glass of water two times a day 3)  Glucosamine-chondroitin 1500-1200 Mg/15ml Liqd (Glucosamine-chondroitin) .... Take 1 by mouth once daily 4)  Actonel 35 Mg Tabs (Risedronate sodium) .... Take 1 tablet by mouth each week 5)  Calcium 600-d 600-400 Mg-unit Tabs (Calcium carbonate-vitamin d) .... Take 2 by mouth once daily 6)  Vitamin D 1000 Unit Tabs (Cholecalciferol) .... Take 1 by mouth once daily 7)  Px Enteric Aspirin 81 Mg Tbec (Aspirin) .Marland KitchenMarland KitchenMarland Kitchen  Take 1 by mouth once daily 8)  Eligard 22.5 Mg Kit (Leuprolide acetate (3 month)) .Marland Kitchen.. 1 injection every 6 months as needed 9)  Cyclobenzaprine Hcl 5 Mg Tabs (Cyclobenzaprine hcl) .Marland Kitchen.. 1 tab at bedtime  as needed for pain or spasm 10)  Tramadol Hcl 50 Mg Tabs (Tramadol hcl) .... Take 1 by mouth three times a day as needed severe pain 11)  Cyanocobalamin 1000 Mcg/ml Soln (Cyanocobalamin) .Marland Kitchen.. 1 ml intramuscular monthly  Patient Instructions: 1)  wil keep regular follow up

## 2010-03-28 NOTE — Assessment & Plan Note (Signed)
Summary: [facility]   Vital Signs:  Patient profile:   77 year old male Weight:      168 pounds Temp:     96.6 degrees F Pulse rate:   58 / minute Resp:     24 per minute BP sitting:   116 / 50  History of Present Illness: reviewed status with Albin Felling RN has continued with wife's care still hasn't hired anyone to help---though staff help her with a shower twice a week  He has been having increased back pain for about 10 days No known injury--but may have twisted it helping wife with transferring  No fall Has tried heating pad which has helped Using tylenol and tramadol some leg wekanesss--nothing focal Notes more pain when voiding or moving bowels some constipation--has increased mirialax  Prostate cancer hasn't needed rx of late still gets hot flashes Last PSA 0.7  Has not been able to exercise with back walks a little--mostly to dining room hard to stand up straight  hasn't been able to put on support socks edema hasn't been a problem though  Allergies: No Known Drug Allergies  Past History:  Past medical, surgical, family and social histories (including risk factors) reviewed for relevance to current acute and chronic problems.  Past Medical History: Reviewed history from 12/13/2009 and no changes required. Osteoporosis Prostate cancer, hx of-------------surgery then lupron shots Colonic polyps, hx of-----------------adenomatous Osteoarthritis Constipation--chronic Chronic venous insufficiency Vitamin  B12 deficiency  Past Surgical History: Reviewed history from 03/22/2009 and no changes required. Cataract surgery, both eyes- 2000 & 2003 Left knee replacement-2005 Colonoscopy-2003 & 2006 Carcinoma removed from left side of face- (BCC)--2004 Surgery on left leg, broken-1999 Prostate cancer surgery--1996 Volvulus repair--12/10  Family History: Reviewed history from 09/07/2008 and no changes required. Dad died of MI in 52's Mom died of  leukemia  ~78 3 sisters---2 were half sisters No CAD, HTN DM strong in family (including son) No colon or prostate cancer  Social History: Reviewed history from 09/07/2008 and no changes required. 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 Has DNR and wishes to continue. Would accept hospitalization.  Would not want tube feeding (09/07/2008)  Review of Systems  The patient denies chest pain, syncope, and dyspnea on exertion.         Appetite is fine weight stable  sleeps okay   Physical Exam  General:  alert and normal appearance.   Neck:  supple, no masses, no thyromegaly, and no cervical lymphadenopathy.   Lungs:  normal respiratory effort, no intercostal retractions, no accessory muscle use, and normal breath sounds.   Heart:  normal rate, regular rhythm, no murmur, and no gallop.   Abdomen:  soft and non-tender.   Msk:  no back tenderness or spine tenderness. Pain is lateral in right lumbar area SLR is negative bilaterally Neurologic:  mild general leg weakness Able to ambulate just with cane but uses rollator also Mild decreased strength focally--dorsiflexion at ankles L>R Psych:  normally interactive, good eye contact, not anxious appearing, and not depressed appearing.     Impression & Recommendations:  Problem # 1:  BACK PAIN (ICD-724.5) Assessment New seems to be mostly muscular will continue meds discussed absolute need for daily aide assist in wife's care PT evaluation  His updated medication list for this problem includes:    Px Enteric Aspirin 81 Mg Tbec (Aspirin) .Marland Kitchen... Take 1 by mouth once daily  Cyclobenzaprine Hcl 5 Mg Tabs (Cyclobenzaprine hcl) .Marland Kitchen... 1 tab at bedtime as needed for pain or spasm    Tramadol Hcl 50 Mg Tabs (Tramadol hcl) .Marland Kitchen... Take 1 by mouth three times a day as needed severe pain  Problem # 2:  ADENOCARCINOMA, PROSTATE  (ICD-185) Assessment: Improved PSA down low no leupron lately  Problem # 3:  DEGENERATIVE JOINT DISEASE, BOTH KNEES, SEVERE (ICD-715.96) Assessment: Unchanged continues on tylenol and tramadol back has limited mobility so PT may help restore this  His updated medication list for this problem includes:    Px Enteric Aspirin 81 Mg Tbec (Aspirin) .Marland Kitchen... Take 1 by mouth once daily    Cyclobenzaprine Hcl 5 Mg Tabs (Cyclobenzaprine hcl) .Marland Kitchen... 1 tab at bedtime as needed for pain or spasm    Tramadol Hcl 50 Mg Tabs (Tramadol hcl) .Marland Kitchen... Take 1 by mouth three times a day as needed severe pain  Problem # 4:  OSTEOPOROSIS (ICD-733.00) Assessment: Unchanged continues on Rx  His updated medication list for this problem includes:    Actonel 35 Mg Tabs (Risedronate sodium) .Marland Kitchen... Take 1 tablet by mouth each week    Calcium 600-d 600-400 Mg-unit Tabs (Calcium carbonate-vitamin d) .Marland Kitchen... Take 2 by mouth once daily    Vitamin D 1000 Unit Tabs (Cholecalciferol) .Marland Kitchen... Take 1 by mouth once daily  Problem # 5:  CONSTIPATION, CHRONIC (ICD-564.09) Assessment: Deteriorated trying a second dose of miralax in evening  His updated medication list for this problem includes:    Metamucil 30.9 % Powd (Psyllium) .Marland Kitchen... Take with full glass of water two times a day    Miralax Powd (Polyethylene glycol 3350) .Marland Kitchen... Take with full glass of water two times a day  Complete Medication List: 1)  Metamucil 30.9 % Powd (Psyllium) .... Take with full glass of water two times a day 2)  Miralax Powd (Polyethylene glycol 3350) .... Take with full glass of water two times a day 3)  Glucosamine-chondroitin 1500-1200 Mg/36ml Liqd (Glucosamine-chondroitin) .... Take 1 by mouth once daily 4)  Actonel 35 Mg Tabs (Risedronate sodium) .... Take 1 tablet by mouth each week 5)  Calcium 600-d 600-400 Mg-unit Tabs (Calcium carbonate-vitamin d) .... Take 2 by mouth once daily 6)  Vitamin D 1000 Unit Tabs (Cholecalciferol) .... Take 1 by  mouth once daily 7)  Px Enteric Aspirin 81 Mg Tbec (Aspirin) .... Take 1 by mouth once daily 8)  Eligard 22.5 Mg Kit (Leuprolide acetate (3 month)) .Marland Kitchen.. 1 injection every 6 months as needed 9)  Cyclobenzaprine Hcl 5 Mg Tabs (Cyclobenzaprine hcl) .Marland Kitchen.. 1 tab at bedtime as needed for pain or spasm 10)  Tramadol Hcl 50 Mg Tabs (Tramadol hcl) .... Take 1 by mouth three times a day as needed severe pain 11)  Cyanocobalamin 1000 Mcg/ml Soln (Cyanocobalamin) .Marland Kitchen.. 1 ml intramuscular monthly  Patient Instructions: 1)  Will plan follow up in 2-3 months

## 2010-03-28 NOTE — Progress Notes (Signed)
  Phone Note From Other Clinic   Caller: radiologist--[facility] Call For: ARon Summary of Call: MRI for adhesions is not the appropriate test--- MRI cancelled--pt will call office Tuesday to have CT scheduled. Radiologist from [facility] called --pt has acute compression fx L3--25% loss height--benign Initial call taken by: Loreen Freud DO,  February 23, 2010 5:20 PM  Follow-up for Phone Call        [facility] order placed for CT.

## 2010-04-03 ENCOUNTER — Encounter: Payer: Self-pay | Admitting: Internal Medicine

## 2010-04-17 NOTE — Medication Information (Signed)
Summary: [employer] [facility]   Imported By: Kassie Mends 04/09/2010 09:24:21  _____________________________________________________________________  External Attachment:    Type:   Image     Comment:   External Document

## 2010-04-23 NOTE — Letter (Signed)
Summary: [facility]  [facility]   Imported By: Maryln Gottron 04/19/2010 15:54:41  _____________________________________________________________________  External Attachment:    Type:   Image     Comment:   External Document  Appended Document: [facility] continued observation for prostate cancer with stable PSA now

## 2010-04-29 ENCOUNTER — Encounter: Payer: Self-pay | Admitting: Internal Medicine

## 2010-06-12 ENCOUNTER — Ambulatory Visit: Payer: Self-pay | Admitting: Internal Medicine

## 2010-06-13 LAB — PSA

## 2010-06-25 ENCOUNTER — Ambulatory Visit: Payer: Self-pay | Admitting: Internal Medicine

## 2010-07-11 ENCOUNTER — Non-Acute Institutional Stay: Payer: Self-pay | Admitting: Internal Medicine

## 2010-07-11 ENCOUNTER — Encounter: Payer: Self-pay | Admitting: Internal Medicine

## 2010-07-11 VITALS — BP 102/60 | HR 55 | Temp 97.6°F | Resp 16 | Wt 161.0 lb

## 2010-07-11 DIAGNOSIS — C61 Malignant neoplasm of prostate: Secondary | ICD-10-CM

## 2010-07-11 DIAGNOSIS — M810 Age-related osteoporosis without current pathological fracture: Secondary | ICD-10-CM

## 2010-07-11 DIAGNOSIS — K5909 Other constipation: Secondary | ICD-10-CM

## 2010-07-11 DIAGNOSIS — I872 Venous insufficiency (chronic) (peripheral): Secondary | ICD-10-CM

## 2010-07-11 DIAGNOSIS — M549 Dorsalgia, unspecified: Secondary | ICD-10-CM

## 2010-07-11 DIAGNOSIS — M1990 Unspecified osteoarthritis, unspecified site: Secondary | ICD-10-CM

## 2010-07-11 NOTE — Patient Instructions (Signed)
Will plan follow up in about 3 months 

## 2010-07-11 NOTE — Progress Notes (Signed)
Subjective:    Patient ID: Benjamin Villarreal, male    DOB: March 20, 1920, 77 y.o.   MRN: [id number]  HPI Moved to new single room Very happy with it---very bright Still over with wife every day----she comes over for lunch daily also  He had fall in [place] face and has bruise around eye No LOC Very little pain. Some soreness in left shoulder that is mostly better  Back pain is doing okay Takes tramadol in the morning and often doesn't need anything else Knee is doing okay currently No major functional problems---able to walk all the way over to health care Still sees Dr Gavin Potters --ortho Offerred supartz but doesn't sound like he needs it  Since fall, has agreed to use rollator Clearly safer with this and helps with occ balance problems Needs light weight rollator to allow transport in car  Wears support socks regularly  This controls edema well  Sees Dr Sherrlyn Hock Hasn't needed lupron lately due to PSA control  Current outpatient prescriptions:aspirin 81 MG EC tablet, Take 81 mg by mouth daily.  , Disp: , Rfl: ;  Calcium-Vitamin D 600-200 MG-UNIT per tablet, Take 2 tablets by mouth daily.  , Disp: , Rfl: ;  cholecalciferol (VITAMIN D) 1000 UNITS tablet, Take 1,000 Units by mouth daily.  , Disp: , Rfl: ;  ciclopirox (LOPROX) 0.77 % cream, Apply topically 2 (two) times daily.  , Disp: , Rfl:  cyanocobalamin 1000 MCG tablet, Inject 1,000 mcg into the muscle every 30 (thirty) days.  , Disp: , Rfl: ;  cyclobenzaprine (FLEXERIL) 5 MG tablet, Take 5 mg by mouth at bedtime as needed.  , Disp: , Rfl: ;  Glucosamine-Chondroit-Vit C-Mn (GLUCOSAMINE CHONDR 1500 COMPLX) CAPS, Take 1 capsule by mouth daily.  , Disp: , Rfl: ;  hydrocortisone 2.5 % cream, Apply topically 2 (two) times daily.  , Disp: , Rfl:  leuprolide (LUPRON) 45 MG injection, Inject 45 mg into the skin every 6 (six) months.  , Disp: , Rfl: ;  polyethylene glycol (MIRALAX / GLYCOLAX) packet, Take 17 g by mouth 2 (two) times daily.  ,  Disp: , Rfl: ;  psyllium (METAMUCIL) 58.6 % powder, Take 1 packet by mouth 2 (two) times daily.  , Disp: , Rfl: ;  traMADol (ULTRAM) 50 MG tablet, Take 50 mg by mouth 3 (three) times daily as needed.  , Disp: , Rfl:  DISCONTD: risedronate (ACTONEL) 35 MG tablet, Take 35 mg by mouth every 7 (seven) days. with water on empty stomach, nothing by mouth or lie down for next 30 minutes. , Disp: , Rfl:   Past Medical History  Diagnosis Date  . Osteoporosis   . Arthritis   . Prostate cancer     surgery then lupron  . Colon polyps     adenomatous  . Other constipation     chronic  . Unspecified venous (peripheral) insufficiency   . Vitamin B12 deficiency     Past Surgical History  Procedure Date  . Cataract extraction 2000, 2003    both eyes  . Joint replacement 2005    Left knee  . Basal cell carcinoma excision 2004    Left side of face  . Tibia fracture surgery 1999  . Prostate surgery 1996    cancer  . Volvulus reduction 12/10    Family History  Problem Relation Age of Onset  . Diabetes Son   . Hypertension Paternal Grandfather     History   Social History  .  Marital Status: Married    Spouse Name: Johnny Bridge    Number of Children: 3  . Years of Education: N/A   Occupational History  . [employer]     retired   Social History Main Topics  . Smoking status: Never Smoker   . Smokeless tobacco: Never Used  . Alcohol Use: 0.0 - 0.5 oz/week    0-1 drink(s) per week     in past  . Drug Use: Not on file  . Sexually Active: Not on file   Other Topics Concern  . Not on file   Social History Narrative   Has living willDaughter Cammy Brochure holds health care POA.Has DNR and we have reviewed this.Would not want tube feedings if cognitively unaware   Review of Systems Ongoing constipation---controlled with miralax and senna S bid. Occ strains Voids fine Sleeping well Appetite is good Weight is stable Some pressure in ear---better if he clears them (hold nose and  pops ears)     Objective:   Physical Exam  Constitutional: He appears well-developed and well-nourished. No distress.  HENT:       Bruising around left eye with no major orbital tenderness EOM full  Neck: Normal range of motion. Neck supple. No thyromegaly present.  Cardiovascular: Normal rate, regular rhythm and normal heart sounds.  Exam reveals no gallop.   No murmur heard. Pulmonary/Chest: Effort normal and breath sounds normal. No respiratory distress. He has no wheezes. He has no rales.  Abdominal: Soft. There is no tenderness.  Musculoskeletal: Normal range of motion. He exhibits no edema and no tenderness.       Back is somewhat stiff  Lymphadenopathy:    He has no cervical adenopathy.  Psychiatric: He has a normal mood and affect. His behavior is normal. Judgment and thought content normal.          Assessment & Plan:

## 2010-07-17 NOTE — Progress Notes (Signed)
  Subjective:    Patient ID: Benjamin Villarreal, male    DOB: [birth year], 78 y.o.   MRN: [id number]  HPI He also needs the rollator walker to maintain balance while in apartment. For prolonged standing--like when shaving, moving about from bathroom to kitchen to living area, etc Since fall, this helps his confidence and allows for continued independence in ADLs   Review of Systems     Objective:   Physical Exam        Assessment & Plan:

## 2010-09-11 ENCOUNTER — Ambulatory Visit: Payer: Self-pay | Admitting: Internal Medicine

## 2010-09-25 ENCOUNTER — Ambulatory Visit: Payer: Self-pay | Admitting: Internal Medicine

## 2010-10-10 ENCOUNTER — Encounter: Payer: Medicare Other | Admitting: Internal Medicine

## 2010-10-17 ENCOUNTER — Ambulatory Visit: Payer: Medicare Other | Admitting: Internal Medicine

## 2010-10-17 ENCOUNTER — Encounter: Payer: Self-pay | Admitting: Internal Medicine

## 2010-10-17 VITALS — BP 102/54 | HR 56 | Temp 97.5°F | Resp 16 | Wt 164.0 lb

## 2010-10-17 DIAGNOSIS — M1710 Unilateral primary osteoarthritis, unspecified knee: Secondary | ICD-10-CM

## 2010-10-17 DIAGNOSIS — L570 Actinic keratosis: Secondary | ICD-10-CM

## 2010-10-17 DIAGNOSIS — M810 Age-related osteoporosis without current pathological fracture: Secondary | ICD-10-CM

## 2010-10-17 DIAGNOSIS — K5909 Other constipation: Secondary | ICD-10-CM

## 2010-10-17 DIAGNOSIS — C61 Malignant neoplasm of prostate: Secondary | ICD-10-CM

## 2010-10-17 NOTE — Assessment & Plan Note (Signed)
Continues on vitamin D and calcium

## 2010-10-17 NOTE — Progress Notes (Signed)
Subjective:    Patient ID: Benjamin Villarreal, male    DOB: [birth year], 77 y.o.   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 No confusion or sedation with this  Hasn't needed a lupron in some time Last PSA 0.7 No trouble voiding--does have to push to empty bladder Nocturia x 1-2--doesn't disturb him  Bowels are okay but still slow Some straining at times Takes metamucil bid, senna, miralax  Current Outpatient Prescriptions on File Prior to Visit  Medication Sig Dispense Refill  . aspirin 81 MG EC tablet Take 81 mg by mouth daily.        . Calcium-Vitamin D 600-200 MG-UNIT per tablet Take 2 tablets by mouth daily.        . cholecalciferol (VITAMIN D) 1000 UNITS tablet Take 1,000 Units by mouth daily.        . ciclopirox (LOPROX) 0.77 % cream Apply topically 2 (two) times daily.        . cyanocobalamin 1000 MCG tablet Inject 1,000 mcg into the muscle every 30 (thirty) days.        . cyclobenzaprine (FLEXERIL) 5 MG tablet Take 5 mg by mouth at bedtime as needed.        . Glucosamine-Chondroit-Vit C-Mn (GLUCOSAMINE CHONDR 1500 COMPLX) CAPS Take 1 capsule by mouth daily.        . hydrocortisone 2.5 % cream Apply topically 2 (two) times daily.        Marland Kitchen leuprolide (LUPRON) 45 MG injection Inject 22.5 mg into the skin every 6 (six) months.       . polyethylene glycol (MIRALAX / GLYCOLAX) packet Take 17 g by mouth 2 (two) times daily.        . psyllium (METAMUCIL) 58.6 % powder Take 1 packet by mouth 2 (two) times daily.        . traMADol (ULTRAM) 50 MG tablet Take 50 mg by mouth 3 (three) times daily as needed.          Not on File  Past Medical History  Diagnosis Date  . Osteoporosis   . Arthritis   . Prostate cancer     surgery then lupron  . Colon polyps     adenomatous    . Other constipation     chronic  . Unspecified venous (peripheral) insufficiency   . Vitamin B12 deficiency     Past Surgical History  Procedure Date  . Cataract extraction 2000, 2003    both eyes  . Joint replacement 2005    Left knee  . Basal cell carcinoma excision 2004    Left side of face  . Tibia fracture surgery 1999  . Prostate surgery 1996    cancer  . Volvulus reduction 12/10    Family History  Problem Relation Age of Onset  . Diabetes Son   . Hypertension Paternal Grandfather     History   Social History  . Marital Status: Married    Spouse Name: Johnny Bridge    Number of Children: 3  . Years of Education: N/A   Occupational History  . [employer]     retired   Social History Main Topics  . Smoking status: Never Smoker   . Smokeless tobacco: Never Used  . Alcohol Use: 0.0 - 0.5 oz/week  0-1 drink(s) per week     in past  . Drug Use: Not on file  . Sexually Active: Not on file   Other Topics Concern  . Not on file   Social History Narrative   Has living willDaughter Cammy Brochure holds health care POA.Has DNR and we have reviewed this.Would not want tube feedings if cognitively unaware   Review of Systems Appetite is good Weight is stable occ exercises in [facility] Sleeps well in general Has small area on scalp he wants checked     Objective:   Physical Exam  Constitutional: He appears well-developed and well-nourished. No distress.  Neck: Normal range of motion. Neck supple.  Cardiovascular: Normal rate, regular rhythm and normal heart sounds.  Exam reveals no gallop.   No murmur heard. Pulmonary/Chest: Effort normal and breath sounds normal. No respiratory distress. He has no wheezes. He has no rales.  Abdominal: Soft. There is no tenderness.  Musculoskeletal: He exhibits no edema.       No effusion in knees  Lymphadenopathy:    He has no cervical adenopathy.  Skin:       Actinic in left parietal scalp  Psychiatric: He  has a normal mood and affect. His behavior is normal. Judgment and thought content normal.          Assessment & Plan:

## 2010-10-17 NOTE — Patient Instructions (Signed)
Will plan follow up in about 3 months 

## 2010-10-17 NOTE — Assessment & Plan Note (Signed)
Doing fairly well Tries to exercise Reasonable relief with tylenol and tramadol

## 2010-10-17 NOTE — Assessment & Plan Note (Signed)
Still fairly quiet Hasn't needed Rx in some time Dr Sherrlyn Hock follows

## 2010-10-17 NOTE — Assessment & Plan Note (Signed)
Does okay with his current regimen

## 2010-10-17 NOTE — Assessment & Plan Note (Signed)
Cryotherapy to scalp lesion 60 seconds x 2 Discussed care---consider office visit for more aggressive Rx if recurs

## 2010-11-18 ENCOUNTER — Other Ambulatory Visit: Payer: Self-pay

## 2010-11-18 MED ORDER — CYCLOBENZAPRINE HCL 5 MG PO TABS: 5.0000 mg | ORAL_TABLET | Freq: Every evening | ORAL | Status: DC | PRN

## 2010-11-18 NOTE — Telephone Encounter (Signed)
Carla from [facility] faxed form for refill of Flexeril 5 mg. Form is on your desk in the in box.

## 2010-11-18 NOTE — Telephone Encounter (Signed)
Completed form faxed to [phone] and Albin Felling at [facility] [phone] as instructed.

## 2010-11-18 NOTE — Telephone Encounter (Signed)
Okay #90 x 3 

## 2010-12-11 ENCOUNTER — Ambulatory Visit: Payer: Self-pay | Admitting: Internal Medicine

## 2010-12-26 ENCOUNTER — Ambulatory Visit: Payer: Self-pay | Admitting: Internal Medicine

## 2011-01-23 ENCOUNTER — Encounter: Payer: Medicare Other | Admitting: Internal Medicine

## 2011-01-27 ENCOUNTER — Other Ambulatory Visit: Payer: Self-pay | Admitting: *Deleted

## 2011-01-27 MED ORDER — CYANOCOBALAMIN 1000 MCG/ML IJ SOLN: 1000.0000 ug | INTRAMUSCULAR | Status: DC

## 2011-01-27 NOTE — Telephone Encounter (Signed)
rx faxed to pharmacy manually  

## 2011-02-20 ENCOUNTER — Non-Acute Institutional Stay: Payer: Medicare Other | Admitting: Internal Medicine

## 2011-02-20 ENCOUNTER — Encounter: Payer: Self-pay | Admitting: Internal Medicine

## 2011-02-20 VITALS — BP 110/60 | HR 60 | Temp 97.9°F | Resp 16 | Wt 166.0 lb

## 2011-02-20 DIAGNOSIS — M1710 Unilateral primary osteoarthritis, unspecified knee: Secondary | ICD-10-CM

## 2011-02-20 DIAGNOSIS — I872 Venous insufficiency (chronic) (peripheral): Secondary | ICD-10-CM

## 2011-02-20 DIAGNOSIS — K5909 Other constipation: Secondary | ICD-10-CM

## 2011-02-20 DIAGNOSIS — C61 Malignant neoplasm of prostate: Secondary | ICD-10-CM

## 2011-02-20 NOTE — Assessment & Plan Note (Signed)
Hasn't been troubled with edema of late Uses support socks No ulcers or sig pain

## 2011-02-20 NOTE — Assessment & Plan Note (Signed)
Doing okay with the pain meds Doesn't think synvisc did much good Will consider another cortisone injection if worsens

## 2011-02-20 NOTE — Progress Notes (Signed)
Subjective:    Patient ID: Benjamin Villarreal, male    DOB: Apr 29, 1920, 77 y.o.   MRN: [id number]  HPI No new concerns per Benjamin Felling RN  Has been having ongoing right knee pain Dr Gavin Potters injected synvisc or similar product He didn't notice as much relief as a cortisone shot Thinks it may have aggravated "circulation problem" in leg He notes "lack of sensitivity" --explained that this is neurologic  Still has regular follow up with Dr Sherrlyn Hock Has not needed lupron for some time Not sure of exact PSA but it has been okay (?0.6 was last he knows of)  Bowels have been okay Using senna daily--- 7 a day Still on miralax but not metamucil  No sig leg swelling No chest pain No SOB  Current Outpatient Prescriptions on File Prior to Visit  Medication Sig Dispense Refill  . aspirin 81 MG EC tablet Take 81 mg by mouth daily.        . Calcium-Vitamin D 600-200 MG-UNIT per tablet Take 2 tablets by mouth daily.        . cholecalciferol (VITAMIN D) 1000 UNITS tablet Take 1,000 Units by mouth daily.        . ciclopirox (LOPROX) 0.77 % cream Apply topically 2 (two) times daily.        . cyanocobalamin (,VITAMIN B-12,) 1000 MCG/ML injection Inject 1 mL (1,000 mcg total) into the muscle every 30 (thirty) days.  1 mL  12  . cyclobenzaprine (FLEXERIL) 5 MG tablet Take 1 tablet (5 mg total) by mouth at bedtime as needed.  90 tablet  3  . Glucosamine-Chondroit-Vit C-Mn (GLUCOSAMINE CHONDR 1500 COMPLX) CAPS Take 1 capsule by mouth daily.        . hydrocortisone 2.5 % cream Apply topically 2 (two) times daily.        Marland Kitchen leuprolide (LUPRON) 45 MG injection Inject 22.5 mg into the skin every 6 (six) months.       . polyethylene glycol (MIRALAX / GLYCOLAX) packet Take 17 g by mouth 2 (two) times daily.        . psyllium (METAMUCIL) 58.6 % powder Take 1 packet by mouth 2 (two) times daily.        . traMADol (ULTRAM) 50 MG tablet Take 50 mg by mouth 3 (three) times daily as needed.          Not on File  Past  Medical History  Diagnosis Date  . Osteoporosis   . Arthritis   . Prostate cancer     surgery then lupron  . Colon polyps     adenomatous  . Other constipation     chronic  . Unspecified venous (peripheral) insufficiency   . Vitamin B12 deficiency     Past Surgical History  Procedure Date  . Cataract extraction 2000, 2003    both eyes  . Joint replacement 2005    Left knee  . Basal cell carcinoma excision 2004    Left side of face  . Tibia fracture surgery 1999  . Prostate surgery 1996    cancer  . Volvulus reduction 12/10    Family History  Problem Relation Age of Onset  . Diabetes Son   . Hypertension Paternal Grandfather     History   Social History  . Marital Status: Married    Spouse Name: Johnny Bridge    Number of Children: 3  . Years of Education: N/A   Occupational History  . [employer]     retired  Social History Main Topics  . Smoking status: Never Smoker   . Smokeless tobacco: Never Used  . Alcohol Use: 0.0 - 0.5 oz/week    0-1 drink(s) per week     in past  . Drug Use: Not on file  . Sexually Active: Not on file   Other Topics Concern  . Not on file   Social History Narrative   Has living willDaughter Cammy Brochure holds health care POA.Has DNR and we have reviewed this.Would not want tube feedings if cognitively unaware   Review of Systems Appetite is fine Weight is stable Sleeps okay occ upper abdominal discomfort when he moves a certain way---never persists Gets around with rollator---no falls Uses cane also---in apt    Objective:   Physical Exam  Constitutional: He appears well-developed and well-nourished. No distress.  Neck: Normal range of motion. Neck supple. No thyromegaly present.  Cardiovascular: Normal rate, regular rhythm, normal heart sounds and intact distal pulses.  Exam reveals no gallop.   No murmur heard. Pulmonary/Chest: Effort normal and breath sounds normal. No respiratory distress. He has no wheezes. He  has no rales.  Abdominal: Soft. There is no tenderness.  Musculoskeletal: He exhibits no edema and no tenderness.  Lymphadenopathy:    He has no cervical adenopathy.  Psychiatric: He has a normal mood and affect. His behavior is normal. Judgment and thought content normal.          Assessment & Plan:

## 2011-02-20 NOTE — Assessment & Plan Note (Signed)
stiil seems to be quiet Gets regular follow up but PSA consistently under 1 apparently

## 2011-02-20 NOTE — Assessment & Plan Note (Signed)
Does okay with his aggressive regimen

## 2011-03-04 DIAGNOSIS — L608 Other nail disorders: Secondary | ICD-10-CM | POA: Diagnosis not present

## 2011-03-04 DIAGNOSIS — D1800 Hemangioma unspecified site: Secondary | ICD-10-CM | POA: Diagnosis not present

## 2011-03-04 DIAGNOSIS — B359 Dermatophytosis, unspecified: Secondary | ICD-10-CM | POA: Diagnosis not present

## 2011-03-04 DIAGNOSIS — L821 Other seborrheic keratosis: Secondary | ICD-10-CM | POA: Diagnosis not present

## 2011-03-12 ENCOUNTER — Ambulatory Visit: Payer: Self-pay | Admitting: Internal Medicine

## 2011-03-12 DIAGNOSIS — R972 Elevated prostate specific antigen [PSA]: Secondary | ICD-10-CM | POA: Diagnosis not present

## 2011-03-12 DIAGNOSIS — D649 Anemia, unspecified: Secondary | ICD-10-CM | POA: Diagnosis not present

## 2011-03-12 DIAGNOSIS — M810 Age-related osteoporosis without current pathological fracture: Secondary | ICD-10-CM | POA: Diagnosis not present

## 2011-03-12 DIAGNOSIS — Z7982 Long term (current) use of aspirin: Secondary | ICD-10-CM | POA: Diagnosis not present

## 2011-03-12 DIAGNOSIS — C61 Malignant neoplasm of prostate: Secondary | ICD-10-CM | POA: Diagnosis not present

## 2011-03-12 DIAGNOSIS — M1990 Unspecified osteoarthritis, unspecified site: Secondary | ICD-10-CM | POA: Diagnosis not present

## 2011-03-12 DIAGNOSIS — D696 Thrombocytopenia, unspecified: Secondary | ICD-10-CM | POA: Diagnosis not present

## 2011-03-12 DIAGNOSIS — Z79899 Other long term (current) drug therapy: Secondary | ICD-10-CM | POA: Diagnosis not present

## 2011-03-12 DIAGNOSIS — K5900 Constipation, unspecified: Secondary | ICD-10-CM | POA: Diagnosis not present

## 2011-03-12 DIAGNOSIS — Z85828 Personal history of other malignant neoplasm of skin: Secondary | ICD-10-CM | POA: Diagnosis not present

## 2011-03-12 LAB — CBC CANCER CENTER
Eosinophil #: 0.3 x10 3/mm (ref 0.0–0.7)
Eosinophil %: 5.3 %
Lymphocyte #: 1.7 x10 3/mm (ref 1.0–3.6)
MCH: 29.5 pg (ref 26.0–34.0)
MCHC: 33.9 g/dL (ref 32.0–36.0)
MCV: 87 fL (ref 80–100)
Monocyte #: 0.5 x10 3/mm (ref 0.0–0.7)
Neutrophil %: 57.9 %
Platelet: 164 x10 3/mm (ref 150–440)
RBC: 4.08 10*6/uL — ABNORMAL LOW (ref 4.40–5.90)
RDW: 15.4 % — ABNORMAL HIGH (ref 11.5–14.5)

## 2011-03-12 LAB — CREATININE, SERUM
Creatinine: 1 mg/dL
EGFR (African American): >60
EGFR (Non-African Amer.): >60

## 2011-03-12 LAB — HEPATIC FUNCTION PANEL A (ARMC)
Albumin: 3.7 g/dL (ref 3.4–5.0)
Bilirubin, Direct: 0.1 mg/dL (ref 0.00–0.20)
SGOT(AST): 19 U/L (ref 15–37)

## 2011-03-13 LAB — PSA: PSA: 1.1 ng/mL (ref 0.0–4.0)

## 2011-03-28 ENCOUNTER — Ambulatory Visit: Payer: Self-pay | Admitting: Internal Medicine

## 2011-04-15 DIAGNOSIS — M1710 Unilateral primary osteoarthritis, unspecified knee: Secondary | ICD-10-CM | POA: Diagnosis not present

## 2011-04-23 DIAGNOSIS — M79609 Pain in unspecified limb: Secondary | ICD-10-CM | POA: Diagnosis not present

## 2011-04-23 DIAGNOSIS — B351 Tinea unguium: Secondary | ICD-10-CM | POA: Diagnosis not present

## 2011-04-23 DIAGNOSIS — M2010 Hallux valgus (acquired), unspecified foot: Secondary | ICD-10-CM | POA: Diagnosis not present

## 2011-05-05 ENCOUNTER — Other Ambulatory Visit: Payer: Self-pay | Admitting: *Deleted

## 2011-05-05 MED ORDER — POLYETHYLENE GLYCOL 3350 17 G PO PACK: 17.0000 g | PACK | Freq: Two times a day (BID) | ORAL | Status: DC

## 2011-05-05 NOTE — Telephone Encounter (Signed)
[facility]ed [facility] from [facility], last filled May 09, 2022.

## 2011-05-07 ENCOUNTER — Telehealth: Payer: Self-pay | Admitting: Family Medicine

## 2011-05-07 MED ORDER — POLYETHYLENE GLYCOL 3350 17 G PO PACK: 17.0000 g | PACK | Freq: Two times a day (BID) | ORAL | Status: DC

## 2011-05-07 NOTE — Telephone Encounter (Signed)
Received email from [name], RN stating that pt needs refill of mirlax sent to [facility].  Please print out and fax rx as entered.

## 2011-05-08 MED ORDER — POLYETHYLENE GLYCOL 3350 17 G PO PACK: 17.0000 g | PACK | Freq: Two times a day (BID) | ORAL | Status: DC

## 2011-05-08 NOTE — Telephone Encounter (Signed)
Addended by: Eliezer Bottom on: 05/08/2011 09:28 AM   Modules accepted: Orders

## 2011-05-08 NOTE — Telephone Encounter (Signed)
Script faxed to [facility]. 

## 2011-05-21 DIAGNOSIS — H40009 Preglaucoma, unspecified, unspecified eye: Secondary | ICD-10-CM | POA: Diagnosis not present

## 2011-05-22 ENCOUNTER — Non-Acute Institutional Stay: Payer: Medicare Other | Admitting: Internal Medicine

## 2011-05-22 ENCOUNTER — Encounter: Payer: Self-pay | Admitting: Internal Medicine

## 2011-05-22 VITALS — BP 100/60 | HR 70 | Temp 98.0°F | Resp 22 | Wt 165.0 lb

## 2011-05-22 DIAGNOSIS — I872 Venous insufficiency (chronic) (peripheral): Secondary | ICD-10-CM

## 2011-05-22 DIAGNOSIS — C61 Malignant neoplasm of prostate: Secondary | ICD-10-CM

## 2011-05-22 DIAGNOSIS — M1710 Unilateral primary osteoarthritis, unspecified knee: Secondary | ICD-10-CM

## 2011-05-22 DIAGNOSIS — K5909 Other constipation: Secondary | ICD-10-CM | POA: Diagnosis not present

## 2011-05-22 NOTE — Assessment & Plan Note (Signed)
Doing okay with current aggressive

## 2011-05-22 NOTE — Assessment & Plan Note (Signed)
Seems to have been quiet Hasn't needed Rx in some time

## 2011-05-22 NOTE — Assessment & Plan Note (Signed)
Doing well on current regimen No sig functional impairment

## 2011-05-22 NOTE — Progress Notes (Signed)
Subjective:    Patient ID: Benjamin Villarreal, male    DOB: [birth year], 78 y.o.   MRN: [id number]  HPI Reviewed care with [facility] RN--doing well  He has had some stress with taxes--having to redeem some investments for the first time  Feels good Right knee still giving him problems Pain in bed and first getting up Able to walk without a problem Notes a lot of noise at times (crepitus) Has never used a brace Uses walker if going sig distance--cane otherwise  Sees Dr Sherrlyn Hock regularly for prostate cancer Still hasn't needed leupron for a while PSA have remained low  Bowels have been okay Has to stay on aggressive regimen Did strain some yesterday---had slight red blood on toilet paper  Current Outpatient Prescriptions on File Prior to Visit  Medication Sig Dispense Refill  . aspirin 81 MG EC tablet Take 81 mg by mouth daily.        . Calcium-Vitamin D 600-200 MG-UNIT per tablet Take 2 tablets by mouth daily.        . cholecalciferol (VITAMIN D) 1000 UNITS tablet Take 1,000 Units by mouth daily.        . ciclopirox (LOPROX) 0.77 % cream Apply topically 2 (two) times daily.        . cyanocobalamin (,VITAMIN B-12,) 1000 MCG/ML injection Inject 1 mL (1,000 mcg total) into the muscle every 30 (thirty) days.  1 mL  12  . cyclobenzaprine (FLEXERIL) 5 MG tablet Take 1 tablet (5 mg total) by mouth at bedtime as needed.  90 tablet  3  . Glucosamine-Chondroit-Vit C-Mn (GLUCOSAMINE CHONDR 1500 COMPLX) CAPS Take 1 capsule by mouth daily.        . hydrocortisone 2.5 % cream Apply topically daily.       Marland Kitchen leuprolide (LUPRON) 45 MG injection Inject 22.5 mg into the skin every 6 (six) months.       . polyethylene glycol (MIRALAX / GLYCOLAX) packet Take 17 g by mouth 2 (two) times daily.  100 each  6  . senna (SENOKOT) 8.6 MG TABS Take 4 tablets by mouth every morning. And 3 in afternoon       . traMADol (ULTRAM) 50 MG tablet Take 50 mg by mouth 3 (three) times daily as needed.          Not on  File  Past Medical History  Diagnosis Date  . Osteoporosis   . Arthritis   . Prostate cancer     surgery then lupron  . Colon polyps     adenomatous  . Other constipation     chronic  . Unspecified venous (peripheral) insufficiency   . Vitamin B12 deficiency     Past Surgical History  Procedure Date  . Cataract extraction 2000, 2003    both eyes  . Joint replacement 2005    Left knee  . Basal cell carcinoma excision 2004    Left side of face  . Tibia fracture surgery 1999  . Prostate surgery 1996    cancer  . Volvulus reduction 12/10    Family History  Problem Relation Age of Onset  . Diabetes Son   . Hypertension Paternal Grandfather     History   Social History  . Marital Status: Married    Spouse Name: Benjamin Villarreal    Number of Children: 3  . Years of Education: N/A   Occupational History  . [employer]     retired   Social History Main Topics  . Smoking status: Never  Smoker   . Smokeless tobacco: Never Used  . Alcohol Use: 0.0 - 0.5 oz/week    0-1 drink(s) per week     in past  . Drug Use: Not on file  . Sexually Active: Not on file   Other Topics Concern  . Not on file   Social History Narrative   Has living willDaughter Benjamin Villarreal holds health care POA.Has DNR and we have reviewed this.Would not want tube feedings if cognitively unaware   Review of Systems Appetite is good Weight is stable Sleeps well     Objective:   Physical Exam  Constitutional: He is oriented to person, place, and time. He appears well-developed and well-nourished. No distress.  Neck: Normal range of motion. Neck supple.  Cardiovascular: Normal rate, regular rhythm, normal heart sounds and intact distal pulses.  Exam reveals no gallop.   No murmur heard. Pulmonary/Chest: Effort normal and breath sounds normal. No respiratory distress. He has no wheezes. He has no rales.  Abdominal: Soft. There is no tenderness.  Musculoskeletal: He exhibits no edema.        Right knee is thickened with crepitus on ROM but without active inflammation  Lymphadenopathy:    He has no cervical adenopathy.  Neurological: He is alert and oriented to person, place, and time.  Psychiatric: He has a normal mood and affect. His behavior is normal. Thought content normal.          Assessment & Plan:

## 2011-05-22 NOTE — Assessment & Plan Note (Signed)
No edema with support socks

## 2011-06-11 ENCOUNTER — Ambulatory Visit: Payer: Self-pay | Admitting: Internal Medicine

## 2011-06-11 DIAGNOSIS — C61 Malignant neoplasm of prostate: Secondary | ICD-10-CM | POA: Diagnosis not present

## 2011-06-11 LAB — CREATININE, SERUM
Creatinine: 0.93 mg/dL (ref 0.60–1.30)
EGFR (African American): >60

## 2011-06-12 LAB — PSA: PSA: 1.2 ng/mL (ref 0.0–4.0)

## 2011-06-25 ENCOUNTER — Ambulatory Visit: Payer: Self-pay | Admitting: Internal Medicine

## 2011-07-03 DIAGNOSIS — Z96659 Presence of unspecified artificial knee joint: Secondary | ICD-10-CM | POA: Diagnosis not present

## 2011-07-03 DIAGNOSIS — M1710 Unilateral primary osteoarthritis, unspecified knee: Secondary | ICD-10-CM | POA: Diagnosis not present

## 2011-07-28 DIAGNOSIS — M79609 Pain in unspecified limb: Secondary | ICD-10-CM | POA: Diagnosis not present

## 2011-07-28 DIAGNOSIS — B351 Tinea unguium: Secondary | ICD-10-CM | POA: Diagnosis not present

## 2011-08-05 ENCOUNTER — Ambulatory Visit (INDEPENDENT_AMBULATORY_CARE_PROVIDER_SITE_OTHER): Payer: Medicare Other | Admitting: Internal Medicine

## 2011-08-05 ENCOUNTER — Encounter: Payer: Self-pay | Admitting: Internal Medicine

## 2011-08-05 VITALS — BP 120/60 | HR 88 | Temp 98.5°F | Wt 167.0 lb

## 2011-08-05 DIAGNOSIS — J019 Acute sinusitis, unspecified: Secondary | ICD-10-CM

## 2011-08-05 NOTE — Assessment & Plan Note (Signed)
May be viral but with the systemic symptoms of fever and sweats---as well as worsening in the past and higher risk---will treat with amoxil 1000mg  bid for 10 days Written for [facility] for facility Supportive care

## 2011-08-05 NOTE — Progress Notes (Signed)
Subjective:    Patient ID: Benjamin Villarreal, male    DOB: [birth year], 78 y.o.   MRN: [id number]  HPI Has had some cough for 2 days or so Got bad yesterday Some fever since 2 nights ago---as high as almost 101 Low grade yesterday  Mild frontal headache--feels sinus Has had problems with this in past  Slept poorly last night Did better sitting up in chair  No SOB No chills but did have sweat last night No sore throat or "just a tinge". Gargled with salt water Does some congestion and rhinorrhea Seems to have some PND---seems to cause the cough No ear pain  Current Outpatient Prescriptions on File Prior to Visit  Medication Sig Dispense Refill  . aspirin 81 MG EC tablet Take 81 mg by mouth daily.        . Calcium-Vitamin D 600-200 MG-UNIT per tablet Take 2 tablets by mouth daily.        . cholecalciferol (VITAMIN D) 1000 UNITS tablet Take 1,000 Units by mouth daily.        . ciclopirox (LOPROX) 0.77 % cream Apply topically 2 (two) times daily.        . cyanocobalamin (,VITAMIN B-12,) 1000 MCG/ML injection Inject 1 mL (1,000 mcg total) into the muscle every 30 (thirty) days.  1 mL  12  . cyclobenzaprine (FLEXERIL) 5 MG tablet Take 1 tablet (5 mg total) by mouth at bedtime as needed.  90 tablet  3  . hydrocortisone 2.5 % cream Apply topically daily.       Marland Kitchen leuprolide (LUPRON) 45 MG injection Inject 22.5 mg into the skin every 6 (six) months.       . polyethylene glycol (MIRALAX / GLYCOLAX) packet Take 17 g by mouth 2 (two) times daily.  100 each  6  . traMADol (ULTRAM) 50 MG tablet Take 50 mg by mouth 3 (three) times daily as needed.          No Known Allergies  Past Medical History  Diagnosis Date  . Osteoporosis   . Arthritis   . Prostate cancer     surgery then lupron  . Colon polyps     adenomatous  . Other constipation     chronic  . Unspecified venous (peripheral) insufficiency   . Vitamin B12 deficiency     Past Surgical History  Procedure Date  . Cataract  extraction 2000, 2003    both eyes  . Joint replacement 2005    Left knee  . Basal cell carcinoma excision 2004    Left side of face  . Tibia fracture surgery 1999  . Prostate surgery 1996    cancer  . Volvulus reduction 12/10    Family History  Problem Relation Age of Onset  . Diabetes Son   . Hypertension Paternal Grandfather     History   Social History  . Marital Status: Married    Spouse Name: Johnny Bridge    Number of Children: 3  . Years of Education: N/A   Occupational History  . [employer]     retired   Social History Main Topics  . Smoking status: Never Smoker   . Smokeless tobacco: Never Used  . Alcohol Use: 0.0 - 0.5 oz/week    0-1 drink(s) per week     in past  . Drug Use: Not on file  . Sexually Active: Not on file   Other Topics Concern  . Not on file   Social History Narrative   Has  living willDaughter Cammy Brochure holds health care POA.Has DNR and we have reviewed this.Would not want tube feedings if cognitively unaware   Review of Systems No vomiting or diarrhea Appetite is okay Chronic constipation but does okay with miralax and senna-s Satisfied with pain regimen for now    Objective:   Physical Exam  Constitutional: He appears well-developed and well-nourished. No distress.  HENT:  Mouth/Throat: Oropharynx is clear and moist. No oropharyngeal exudate.       Mild maxillary tenderness Moderate nasal inflammation    Neck: Normal range of motion. Neck supple. No thyromegaly present.  Pulmonary/Chest: Effort normal and breath sounds normal. No respiratory distress. He has no wheezes. He has no rales.  Lymphadenopathy:    He has no cervical adenopathy.          Assessment & Plan:

## 2011-09-01 DIAGNOSIS — D179 Benign lipomatous neoplasm, unspecified: Secondary | ICD-10-CM | POA: Diagnosis not present

## 2011-09-01 DIAGNOSIS — D1801 Hemangioma of skin and subcutaneous tissue: Secondary | ICD-10-CM | POA: Diagnosis not present

## 2011-09-01 DIAGNOSIS — L909 Atrophic disorder of skin, unspecified: Secondary | ICD-10-CM | POA: Diagnosis not present

## 2011-09-01 DIAGNOSIS — L821 Other seborrheic keratosis: Secondary | ICD-10-CM | POA: Diagnosis not present

## 2011-09-05 DIAGNOSIS — N390 Urinary tract infection, site not specified: Secondary | ICD-10-CM | POA: Diagnosis not present

## 2011-09-08 DIAGNOSIS — R5381 Other malaise: Secondary | ICD-10-CM | POA: Diagnosis not present

## 2011-09-10 ENCOUNTER — Ambulatory Visit: Payer: Self-pay | Admitting: Internal Medicine

## 2011-09-10 DIAGNOSIS — M810 Age-related osteoporosis without current pathological fracture: Secondary | ICD-10-CM | POA: Diagnosis not present

## 2011-09-10 DIAGNOSIS — D649 Anemia, unspecified: Secondary | ICD-10-CM | POA: Diagnosis not present

## 2011-09-10 DIAGNOSIS — S32009A Unspecified fracture of unspecified lumbar vertebra, initial encounter for closed fracture: Secondary | ICD-10-CM | POA: Diagnosis not present

## 2011-09-10 DIAGNOSIS — D696 Thrombocytopenia, unspecified: Secondary | ICD-10-CM | POA: Diagnosis not present

## 2011-09-10 DIAGNOSIS — M1990 Unspecified osteoarthritis, unspecified site: Secondary | ICD-10-CM | POA: Diagnosis not present

## 2011-09-10 DIAGNOSIS — Z85828 Personal history of other malignant neoplasm of skin: Secondary | ICD-10-CM | POA: Diagnosis not present

## 2011-09-10 DIAGNOSIS — C61 Malignant neoplasm of prostate: Secondary | ICD-10-CM | POA: Diagnosis not present

## 2011-09-10 DIAGNOSIS — Z79899 Other long term (current) drug therapy: Secondary | ICD-10-CM | POA: Diagnosis not present

## 2011-09-10 DIAGNOSIS — R5381 Other malaise: Secondary | ICD-10-CM | POA: Diagnosis not present

## 2011-09-10 DIAGNOSIS — Z7982 Long term (current) use of aspirin: Secondary | ICD-10-CM | POA: Diagnosis not present

## 2011-09-10 LAB — HEPATIC FUNCTION PANEL A (ARMC)
Albumin: 3.5 g/dL (ref 3.4–5.0)
Bilirubin, Direct: 0 mg/dL (ref 0.00–0.20)
Bilirubin,Total: 0.2 mg/dL (ref 0.2–1.0)
SGOT(AST): 18 U/L (ref 15–37)
Total Protein: 7 g/dL (ref 6.4–8.2)

## 2011-09-10 LAB — CBC CANCER CENTER
Basophil #: 0 x10 3/mm (ref 0.0–0.1)
Basophil %: 0.3 %
Eosinophil #: 0.5 x10 3/mm (ref 0.0–0.7)
HGB: 11.6 g/dL — ABNORMAL LOW (ref 13.0–18.0)
Lymphocyte #: 1.7 x10 3/mm (ref 1.0–3.6)
Lymphocyte %: 30.9 %
MCH: 27.9 pg (ref 26.0–34.0)
MCHC: 32.5 g/dL (ref 32.0–36.0)
Monocyte %: 9.2 %
Neutrophil #: 2.9 x10 3/mm (ref 1.4–6.5)
Neutrophil %: 51.4 %
Platelet: 143 x10 3/mm — ABNORMAL LOW (ref 150–440)
RDW: 14.4 % (ref 11.5–14.5)

## 2011-09-10 LAB — CREATININE, SERUM
Creatinine: 1.05 mg/dL (ref 0.60–1.30)
EGFR (African American): >60
EGFR (Non-African Amer.): >60

## 2011-09-11 LAB — PSA: PSA: 1.4 ng/mL (ref 0.0–4.0)

## 2011-09-15 DIAGNOSIS — M1710 Unilateral primary osteoarthritis, unspecified knee: Secondary | ICD-10-CM | POA: Diagnosis not present

## 2011-09-23 DIAGNOSIS — M6281 Muscle weakness (generalized): Secondary | ICD-10-CM | POA: Diagnosis not present

## 2011-09-23 DIAGNOSIS — M25569 Pain in unspecified knee: Secondary | ICD-10-CM | POA: Diagnosis not present

## 2011-09-25 ENCOUNTER — Ambulatory Visit: Payer: Self-pay | Admitting: Internal Medicine

## 2011-09-25 DIAGNOSIS — M25569 Pain in unspecified knee: Secondary | ICD-10-CM | POA: Diagnosis not present

## 2011-09-25 DIAGNOSIS — M6281 Muscle weakness (generalized): Secondary | ICD-10-CM | POA: Diagnosis not present

## 2011-09-26 DIAGNOSIS — M25569 Pain in unspecified knee: Secondary | ICD-10-CM | POA: Diagnosis not present

## 2011-09-26 DIAGNOSIS — M6281 Muscle weakness (generalized): Secondary | ICD-10-CM | POA: Diagnosis not present

## 2011-09-29 DIAGNOSIS — M25569 Pain in unspecified knee: Secondary | ICD-10-CM | POA: Diagnosis not present

## 2011-09-29 DIAGNOSIS — M6281 Muscle weakness (generalized): Secondary | ICD-10-CM | POA: Diagnosis not present

## 2011-10-01 DIAGNOSIS — M25569 Pain in unspecified knee: Secondary | ICD-10-CM | POA: Diagnosis not present

## 2011-10-01 DIAGNOSIS — M6281 Muscle weakness (generalized): Secondary | ICD-10-CM | POA: Diagnosis not present

## 2011-10-02 DIAGNOSIS — M6281 Muscle weakness (generalized): Secondary | ICD-10-CM | POA: Diagnosis not present

## 2011-10-02 DIAGNOSIS — M25569 Pain in unspecified knee: Secondary | ICD-10-CM | POA: Diagnosis not present

## 2011-10-06 DIAGNOSIS — M25569 Pain in unspecified knee: Secondary | ICD-10-CM | POA: Diagnosis not present

## 2011-10-06 DIAGNOSIS — M6281 Muscle weakness (generalized): Secondary | ICD-10-CM | POA: Diagnosis not present

## 2011-10-08 DIAGNOSIS — M6281 Muscle weakness (generalized): Secondary | ICD-10-CM | POA: Diagnosis not present

## 2011-10-08 DIAGNOSIS — M25569 Pain in unspecified knee: Secondary | ICD-10-CM | POA: Diagnosis not present

## 2011-10-10 DIAGNOSIS — M6281 Muscle weakness (generalized): Secondary | ICD-10-CM | POA: Diagnosis not present

## 2011-10-10 DIAGNOSIS — M25569 Pain in unspecified knee: Secondary | ICD-10-CM | POA: Diagnosis not present

## 2011-10-13 DIAGNOSIS — M6281 Muscle weakness (generalized): Secondary | ICD-10-CM | POA: Diagnosis not present

## 2011-10-13 DIAGNOSIS — M25569 Pain in unspecified knee: Secondary | ICD-10-CM | POA: Diagnosis not present

## 2011-10-15 DIAGNOSIS — M25569 Pain in unspecified knee: Secondary | ICD-10-CM | POA: Diagnosis not present

## 2011-10-15 DIAGNOSIS — M6281 Muscle weakness (generalized): Secondary | ICD-10-CM | POA: Diagnosis not present

## 2011-10-22 DIAGNOSIS — M1710 Unilateral primary osteoarthritis, unspecified knee: Secondary | ICD-10-CM | POA: Diagnosis not present

## 2011-10-30 ENCOUNTER — Non-Acute Institutional Stay: Payer: Medicare Other | Admitting: Internal Medicine

## 2011-10-30 ENCOUNTER — Encounter: Payer: Self-pay | Admitting: Internal Medicine

## 2011-10-30 VITALS — BP 102/60 | HR 80 | Temp 97.4°F | Resp 24 | Wt 163.0 lb

## 2011-10-30 DIAGNOSIS — B351 Tinea unguium: Secondary | ICD-10-CM | POA: Diagnosis not present

## 2011-10-30 DIAGNOSIS — Z8546 Personal history of malignant neoplasm of prostate: Secondary | ICD-10-CM

## 2011-10-30 DIAGNOSIS — M1710 Unilateral primary osteoarthritis, unspecified knee: Secondary | ICD-10-CM | POA: Diagnosis not present

## 2011-10-30 DIAGNOSIS — M810 Age-related osteoporosis without current pathological fracture: Secondary | ICD-10-CM

## 2011-10-30 DIAGNOSIS — IMO0002 Reserved for concepts with insufficient information to code with codable children: Secondary | ICD-10-CM

## 2011-10-30 DIAGNOSIS — I872 Venous insufficiency (chronic) (peripheral): Secondary | ICD-10-CM

## 2011-10-30 DIAGNOSIS — M79609 Pain in unspecified limb: Secondary | ICD-10-CM | POA: Diagnosis not present

## 2011-10-30 MED ORDER — TRAMADOL HCL 50 MG PO TABS: 50.0000 mg | ORAL_TABLET | Freq: Three times a day (TID) | ORAL | Status: DC | PRN

## 2011-10-30 MED ORDER — HYDROCORTISONE 2.5 % EX CREA: TOPICAL_CREAM | Freq: Every day | CUTANEOUS | Status: DC

## 2011-10-30 NOTE — Progress Notes (Signed)
Subjective:    Patient ID: Benjamin Villarreal, male    DOB: 1920-06-03, 78 y.o.   MRN: [id number]  HPI Reviewed with Albin Felling RN No new concerns about his health   Wife just back from another hospitalization She is fading and we briefly discussed considering a more palliative approach  Now sees Dr Rosita Kea for orthopedist---Dr Gavin Potters has switched to [facility] Concerned about right knee---might need TKR He notes no major pain problems Able to get around with rollator Referred for PT----trying to reduce the patella back to neutral position (it is laterally displaced). This caused more pain Still able to drive, etc Only occasionally uses the tramadol  Does get leg cramps in the morning Trying to eat bananas--hard to tell but may have helped Has OTC potassium supplement to try also Discussed loosening up in evening with exercises or walk before bedtime  Hasn't needed lupron shots lately Follows with Dr Sherrlyn Hock  No sig swelling lately Varicose veins have been quiet Does get sensation of "having socks on when I don't" --no  Pain  Continues on calcium and vitamin D Walks regularly  Current Outpatient Prescriptions on File Prior to Visit  Medication Sig Dispense Refill  . aspirin 81 MG EC tablet Take 81 mg by mouth daily.        . Calcium-Vitamin D 600-200 MG-UNIT per tablet Take 2 tablets by mouth daily.        . cholecalciferol (VITAMIN D) 1000 UNITS tablet Take 1,000 Units by mouth daily.        . ciclopirox (LOPROX) 0.77 % cream Apply topically 2 (two) times daily.        . cyanocobalamin (,VITAMIN B-12,) 1000 MCG/ML injection Inject 1 mL (1,000 mcg total) into the muscle every 30 (thirty) days.  1 mL  12  . cyclobenzaprine (FLEXERIL) 5 MG tablet Take 1 tablet (5 mg total) by mouth at bedtime as needed.  90 tablet  3  . Glucosamine-Chondroit-Vit C-Mn (GLUCOSAMINE CHONDR 1500 COMPLX) CAPS Take 1 capsule by mouth daily.      . hydrocortisone 2.5 % cream Apply topically daily.       Marland Kitchen leuprolide  (LUPRON) 45 MG injection Inject 22.5 mg into the skin every 6 (six) months.       . mineral oil-hydrophilic petrolatum (AQUAPHOR) ointment Apply 1 application topically as needed.      . polyethylene glycol (MIRALAX / GLYCOLAX) packet Take 17 g by mouth 2 (two) times daily.  100 each  6  . traMADol (ULTRAM) 50 MG tablet Take 50 mg by mouth 3 (three) times daily as needed.          No Known Allergies  Past Medical History  Diagnosis Date  . Osteoporosis   . Arthritis   . Prostate cancer     surgery then lupron  . Colon polyps     adenomatous  . Other constipation     chronic  . Unspecified venous (peripheral) insufficiency   . Vitamin B12 deficiency     Past Surgical History  Procedure Date  . Cataract extraction 2000, 2003    both eyes  . Joint replacement 2005    Left knee  . Basal cell carcinoma excision 2004    Left side of face  . Tibia fracture surgery 1999  . Prostate surgery 1996    cancer  . Volvulus reduction 12/10    Family History  Problem Relation Age of Onset  . Diabetes Son   . Hypertension Paternal Grandfather  History   Social History  . Marital Status: Married    Spouse Name: Johnny Bridge    Number of Children: 3  . Years of Education: N/A   Occupational History  . [employer]     retired   Social History Main Topics  . Smoking status: Never Smoker   . Smokeless tobacco: Never Used  . Alcohol Use: 0.0 - 0.5 oz/week    0-1 drink(s) per week     in past  . Drug Use: Not on file  . Sexually Active: Not on file   Other Topics Concern  . Not on file   Social History Narrative   Has living willDaughter Cammy Brochure holds health care POA.Has DNR and we have reviewed this.Would not want tube feedings if cognitively unaware   Review of Systems Appetite is okay Weight is down a few pounds Sleeps well     Objective:   Physical Exam  Constitutional: He appears well-developed and well-nourished. No distress.  Neck: Normal range of  motion. Neck supple. No thyromegaly present.  Cardiovascular: Normal rate, regular rhythm and normal heart sounds.  Exam reveals no gallop.   No murmur heard. Pulmonary/Chest: Effort normal and breath sounds normal. No respiratory distress. He has no wheezes. He has no rales.  Abdominal: Soft. There is no tenderness.  Musculoskeletal: He exhibits no edema and no tenderness.       Fairly good ROM in knees Mild deformity without sig effusion  Lymphadenopathy:    He has no cervical adenopathy.  Psychiatric: He has a normal mood and affect. His behavior is normal. Thought content normal.          Assessment & Plan:

## 2011-10-30 NOTE — Assessment & Plan Note (Signed)
Sees Dr Sherrlyn Hock  No recent Rx needed

## 2011-10-30 NOTE — Assessment & Plan Note (Signed)
Continues on calcium and vitamin D Walks regularly

## 2011-10-30 NOTE — Assessment & Plan Note (Signed)
Doing well recently He uses hydrocortisone for skin changes on legs as needed

## 2011-10-30 NOTE — Assessment & Plan Note (Addendum)
Still doing okay functionally I don't recommended TKR Can give cortisone shots intermittently as needed  Uses the tramadol prn

## 2011-11-19 DIAGNOSIS — H40009 Preglaucoma, unspecified, unspecified eye: Secondary | ICD-10-CM | POA: Diagnosis not present

## 2011-12-10 ENCOUNTER — Ambulatory Visit: Payer: Self-pay | Admitting: Internal Medicine

## 2011-12-10 DIAGNOSIS — D649 Anemia, unspecified: Secondary | ICD-10-CM | POA: Diagnosis not present

## 2011-12-10 DIAGNOSIS — C61 Malignant neoplasm of prostate: Secondary | ICD-10-CM | POA: Diagnosis not present

## 2011-12-10 DIAGNOSIS — D696 Thrombocytopenia, unspecified: Secondary | ICD-10-CM | POA: Diagnosis not present

## 2011-12-10 LAB — CBC CANCER CENTER
Basophil #: 0.2 x10 3/mm — ABNORMAL HIGH (ref 0.0–0.1)
Basophil %: 3.1 %
Eosinophil %: 6.8 %
HCT: 37.9 % — ABNORMAL LOW (ref 40.0–52.0)
HGB: 12.1 g/dL — ABNORMAL LOW (ref 13.0–18.0)
Lymphocyte #: 1.8 x10 3/mm (ref 1.0–3.6)
MCH: 27.8 pg (ref 26.0–34.0)
MCHC: 32 g/dL (ref 32.0–36.0)
MCV: 87 fL (ref 80–100)
Monocyte #: 0.6 x10 3/mm (ref 0.2–1.0)
Neutrophil #: 2.7 x10 3/mm (ref 1.4–6.5)
Platelet: 144 x10 3/mm — ABNORMAL LOW (ref 150–440)
RDW: 14.7 % — ABNORMAL HIGH (ref 11.5–14.5)

## 2011-12-10 LAB — CREATININE, SERUM
Creatinine: 1.02 mg/dL (ref 0.60–1.30)
EGFR (African American): >60

## 2011-12-11 LAB — PSA: PSA: 1.2 ng/mL (ref 0.0–4.0)

## 2011-12-16 DIAGNOSIS — Z23 Encounter for immunization: Secondary | ICD-10-CM | POA: Diagnosis not present

## 2011-12-24 DIAGNOSIS — M1710 Unilateral primary osteoarthritis, unspecified knee: Secondary | ICD-10-CM | POA: Diagnosis not present

## 2011-12-26 ENCOUNTER — Ambulatory Visit: Payer: Self-pay | Admitting: Internal Medicine

## 2012-01-15 ENCOUNTER — Ambulatory Visit: Payer: Self-pay | Admitting: Orthopedic Surgery

## 2012-01-15 DIAGNOSIS — M25569 Pain in unspecified knee: Secondary | ICD-10-CM | POA: Diagnosis not present

## 2012-01-15 DIAGNOSIS — M1710 Unilateral primary osteoarthritis, unspecified knee: Secondary | ICD-10-CM | POA: Diagnosis not present

## 2012-01-15 DIAGNOSIS — IMO0002 Reserved for concepts with insufficient information to code with codable children: Secondary | ICD-10-CM | POA: Diagnosis not present

## 2012-01-20 ENCOUNTER — Other Ambulatory Visit: Payer: Self-pay | Admitting: Family Medicine

## 2012-01-25 HISTORY — PX: JOINT REPLACEMENT: SHX530

## 2012-02-02 ENCOUNTER — Ambulatory Visit: Payer: Self-pay | Admitting: Orthopedic Surgery

## 2012-02-02 DIAGNOSIS — IMO0002 Reserved for concepts with insufficient information to code with codable children: Secondary | ICD-10-CM | POA: Diagnosis not present

## 2012-02-02 DIAGNOSIS — Z01810 Encounter for preprocedural cardiovascular examination: Secondary | ICD-10-CM | POA: Diagnosis not present

## 2012-02-02 DIAGNOSIS — Z01812 Encounter for preprocedural laboratory examination: Secondary | ICD-10-CM | POA: Diagnosis not present

## 2012-02-02 DIAGNOSIS — M79609 Pain in unspecified limb: Secondary | ICD-10-CM | POA: Diagnosis not present

## 2012-02-02 LAB — PROTIME-INR
INR: 0.9
Prothrombin Time: 12.8 secs (ref 11.5–14.7)

## 2012-02-02 LAB — CBC
HCT: 35.9 % — ABNORMAL LOW (ref 40.0–52.0)
HGB: 12.1 g/dL — ABNORMAL LOW (ref 13.0–18.0)
MCHC: 33.6 g/dL (ref 32.0–36.0)
RDW: 14.2 % (ref 11.5–14.5)
WBC: 6 10*3/uL (ref 3.8–10.6)

## 2012-02-02 LAB — BASIC METABOLIC PANEL
Anion Gap: 6 — ABNORMAL LOW (ref 7–16)
BUN: 19 mg/dL — ABNORMAL HIGH (ref 7–18)
Calcium, Total: 8.9 mg/dL (ref 8.5–10.1)
Chloride: 104 mmol/L (ref 98–107)
Co2: 28 mmol/L (ref 21–32)
Creatinine: 0.85 mg/dL (ref 0.60–1.30)
EGFR (African American): >60

## 2012-02-02 LAB — APTT: Activated PTT: 31.4 secs (ref 23.6–35.9)

## 2012-02-03 DIAGNOSIS — B351 Tinea unguium: Secondary | ICD-10-CM | POA: Diagnosis not present

## 2012-02-03 DIAGNOSIS — M79609 Pain in unspecified limb: Secondary | ICD-10-CM | POA: Diagnosis not present

## 2012-02-09 DIAGNOSIS — M1710 Unilateral primary osteoarthritis, unspecified knee: Secondary | ICD-10-CM | POA: Diagnosis not present

## 2012-02-10 ENCOUNTER — Inpatient Hospital Stay: Payer: Self-pay | Admitting: Orthopedic Surgery

## 2012-02-10 DIAGNOSIS — Z9049 Acquired absence of other specified parts of digestive tract: Secondary | ICD-10-CM | POA: Diagnosis not present

## 2012-02-10 DIAGNOSIS — Z8546 Personal history of malignant neoplasm of prostate: Secondary | ICD-10-CM | POA: Diagnosis not present

## 2012-02-10 DIAGNOSIS — M6281 Muscle weakness (generalized): Secondary | ICD-10-CM | POA: Diagnosis not present

## 2012-02-10 DIAGNOSIS — Z9079 Acquired absence of other genital organ(s): Secondary | ICD-10-CM | POA: Diagnosis not present

## 2012-02-10 DIAGNOSIS — M810 Age-related osteoporosis without current pathological fracture: Secondary | ICD-10-CM | POA: Diagnosis present

## 2012-02-10 DIAGNOSIS — I70209 Unspecified atherosclerosis of native arteries of extremities, unspecified extremity: Secondary | ICD-10-CM | POA: Diagnosis present

## 2012-02-10 DIAGNOSIS — Z96659 Presence of unspecified artificial knee joint: Secondary | ICD-10-CM | POA: Diagnosis not present

## 2012-02-10 DIAGNOSIS — Z91018 Allergy to other foods: Secondary | ICD-10-CM | POA: Diagnosis not present

## 2012-02-10 DIAGNOSIS — Z7982 Long term (current) use of aspirin: Secondary | ICD-10-CM | POA: Diagnosis not present

## 2012-02-10 DIAGNOSIS — M1710 Unilateral primary osteoarthritis, unspecified knee: Secondary | ICD-10-CM | POA: Diagnosis present

## 2012-02-10 DIAGNOSIS — D62 Acute posthemorrhagic anemia: Secondary | ICD-10-CM | POA: Diagnosis not present

## 2012-02-10 DIAGNOSIS — Z471 Aftercare following joint replacement surgery: Secondary | ICD-10-CM | POA: Diagnosis not present

## 2012-02-10 DIAGNOSIS — D126 Benign neoplasm of colon, unspecified: Secondary | ICD-10-CM | POA: Diagnosis not present

## 2012-02-10 DIAGNOSIS — J310 Chronic rhinitis: Secondary | ICD-10-CM | POA: Diagnosis not present

## 2012-02-10 DIAGNOSIS — Z8249 Family history of ischemic heart disease and other diseases of the circulatory system: Secondary | ICD-10-CM | POA: Diagnosis not present

## 2012-02-10 DIAGNOSIS — Z833 Family history of diabetes mellitus: Secondary | ICD-10-CM | POA: Diagnosis not present

## 2012-02-10 DIAGNOSIS — Z806 Family history of leukemia: Secondary | ICD-10-CM | POA: Diagnosis not present

## 2012-02-10 DIAGNOSIS — Z85828 Personal history of other malignant neoplasm of skin: Secondary | ICD-10-CM | POA: Diagnosis not present

## 2012-02-10 DIAGNOSIS — C4490 Unspecified malignant neoplasm of skin, unspecified: Secondary | ICD-10-CM | POA: Diagnosis not present

## 2012-02-10 DIAGNOSIS — R262 Difficulty in walking, not elsewhere classified: Secondary | ICD-10-CM | POA: Diagnosis not present

## 2012-02-10 DIAGNOSIS — Z9849 Cataract extraction status, unspecified eye: Secondary | ICD-10-CM | POA: Diagnosis not present

## 2012-02-11 LAB — BASIC METABOLIC PANEL
BUN: 12 mg/dL (ref 7–18)
Chloride: 108 mmol/L — ABNORMAL HIGH (ref 98–107)
Co2: 25 mmol/L (ref 21–32)
Creatinine: 0.81 mg/dL (ref 0.60–1.30)
Potassium: 3.9 mmol/L (ref 3.5–5.1)
Sodium: 140 mmol/L (ref 136–145)

## 2012-02-11 LAB — HEMOGLOBIN: HGB: 11.4 g/dL — ABNORMAL LOW (ref 13.0–18.0)

## 2012-02-11 LAB — PLATELET COUNT: Platelet: 142 10*3/uL — ABNORMAL LOW (ref 150–440)

## 2012-02-12 LAB — PATHOLOGY REPORT

## 2012-02-13 DIAGNOSIS — D126 Benign neoplasm of colon, unspecified: Secondary | ICD-10-CM | POA: Diagnosis not present

## 2012-02-13 DIAGNOSIS — C4490 Unspecified malignant neoplasm of skin, unspecified: Secondary | ICD-10-CM | POA: Diagnosis not present

## 2012-02-13 DIAGNOSIS — R262 Difficulty in walking, not elsewhere classified: Secondary | ICD-10-CM | POA: Diagnosis not present

## 2012-02-13 DIAGNOSIS — C61 Malignant neoplasm of prostate: Secondary | ICD-10-CM | POA: Diagnosis not present

## 2012-02-13 DIAGNOSIS — Z85828 Personal history of other malignant neoplasm of skin: Secondary | ICD-10-CM | POA: Diagnosis not present

## 2012-02-13 DIAGNOSIS — M6281 Muscle weakness (generalized): Secondary | ICD-10-CM | POA: Diagnosis not present

## 2012-02-13 DIAGNOSIS — Z96659 Presence of unspecified artificial knee joint: Secondary | ICD-10-CM | POA: Diagnosis not present

## 2012-02-13 DIAGNOSIS — M1710 Unilateral primary osteoarthritis, unspecified knee: Secondary | ICD-10-CM | POA: Diagnosis not present

## 2012-02-13 DIAGNOSIS — M810 Age-related osteoporosis without current pathological fracture: Secondary | ICD-10-CM | POA: Diagnosis not present

## 2012-02-13 DIAGNOSIS — Z9849 Cataract extraction status, unspecified eye: Secondary | ICD-10-CM | POA: Diagnosis not present

## 2012-02-13 DIAGNOSIS — R21 Rash and other nonspecific skin eruption: Secondary | ICD-10-CM | POA: Diagnosis not present

## 2012-02-13 DIAGNOSIS — Z8546 Personal history of malignant neoplasm of prostate: Secondary | ICD-10-CM | POA: Diagnosis not present

## 2012-02-13 DIAGNOSIS — J310 Chronic rhinitis: Secondary | ICD-10-CM | POA: Diagnosis not present

## 2012-02-17 DIAGNOSIS — M1710 Unilateral primary osteoarthritis, unspecified knee: Secondary | ICD-10-CM

## 2012-02-17 DIAGNOSIS — C61 Malignant neoplasm of prostate: Secondary | ICD-10-CM | POA: Diagnosis not present

## 2012-02-17 DIAGNOSIS — M810 Age-related osteoporosis without current pathological fracture: Secondary | ICD-10-CM

## 2012-02-23 ENCOUNTER — Other Ambulatory Visit: Payer: Self-pay | Admitting: Internal Medicine

## 2012-02-25 DIAGNOSIS — Z96659 Presence of unspecified artificial knee joint: Secondary | ICD-10-CM | POA: Diagnosis not present

## 2012-02-25 DIAGNOSIS — R21 Rash and other nonspecific skin eruption: Secondary | ICD-10-CM | POA: Diagnosis not present

## 2012-02-25 DIAGNOSIS — M810 Age-related osteoporosis without current pathological fracture: Secondary | ICD-10-CM | POA: Diagnosis not present

## 2012-02-25 DIAGNOSIS — M6281 Muscle weakness (generalized): Secondary | ICD-10-CM | POA: Diagnosis not present

## 2012-02-25 DIAGNOSIS — R262 Difficulty in walking, not elsewhere classified: Secondary | ICD-10-CM | POA: Diagnosis not present

## 2012-03-05 DIAGNOSIS — R21 Rash and other nonspecific skin eruption: Secondary | ICD-10-CM | POA: Diagnosis not present

## 2012-03-15 DIAGNOSIS — M6281 Muscle weakness (generalized): Secondary | ICD-10-CM | POA: Diagnosis not present

## 2012-03-15 DIAGNOSIS — R262 Difficulty in walking, not elsewhere classified: Secondary | ICD-10-CM | POA: Diagnosis not present

## 2012-03-17 DIAGNOSIS — M6281 Muscle weakness (generalized): Secondary | ICD-10-CM | POA: Diagnosis not present

## 2012-03-17 DIAGNOSIS — R262 Difficulty in walking, not elsewhere classified: Secondary | ICD-10-CM | POA: Diagnosis not present

## 2012-03-19 DIAGNOSIS — M6281 Muscle weakness (generalized): Secondary | ICD-10-CM | POA: Diagnosis not present

## 2012-03-19 DIAGNOSIS — R262 Difficulty in walking, not elsewhere classified: Secondary | ICD-10-CM | POA: Diagnosis not present

## 2012-03-22 DIAGNOSIS — Z96659 Presence of unspecified artificial knee joint: Secondary | ICD-10-CM | POA: Diagnosis not present

## 2012-03-22 DIAGNOSIS — R262 Difficulty in walking, not elsewhere classified: Secondary | ICD-10-CM | POA: Diagnosis not present

## 2012-03-22 DIAGNOSIS — M6281 Muscle weakness (generalized): Secondary | ICD-10-CM | POA: Diagnosis not present

## 2012-03-23 DIAGNOSIS — L84 Corns and callosities: Secondary | ICD-10-CM | POA: Diagnosis not present

## 2012-03-24 DIAGNOSIS — R262 Difficulty in walking, not elsewhere classified: Secondary | ICD-10-CM | POA: Diagnosis not present

## 2012-03-24 DIAGNOSIS — M6281 Muscle weakness (generalized): Secondary | ICD-10-CM | POA: Diagnosis not present

## 2012-03-26 DIAGNOSIS — M6281 Muscle weakness (generalized): Secondary | ICD-10-CM | POA: Diagnosis not present

## 2012-03-26 DIAGNOSIS — R262 Difficulty in walking, not elsewhere classified: Secondary | ICD-10-CM | POA: Diagnosis not present

## 2012-03-27 ENCOUNTER — Ambulatory Visit: Payer: Self-pay | Admitting: Internal Medicine

## 2012-03-27 DIAGNOSIS — M1990 Unspecified osteoarthritis, unspecified site: Secondary | ICD-10-CM | POA: Diagnosis not present

## 2012-03-27 DIAGNOSIS — Z7982 Long term (current) use of aspirin: Secondary | ICD-10-CM | POA: Diagnosis not present

## 2012-03-27 DIAGNOSIS — M810 Age-related osteoporosis without current pathological fracture: Secondary | ICD-10-CM | POA: Diagnosis not present

## 2012-03-27 DIAGNOSIS — Z85828 Personal history of other malignant neoplasm of skin: Secondary | ICD-10-CM | POA: Diagnosis not present

## 2012-03-27 DIAGNOSIS — M8448XA Pathological fracture, other site, initial encounter for fracture: Secondary | ICD-10-CM | POA: Diagnosis not present

## 2012-03-27 DIAGNOSIS — G8929 Other chronic pain: Secondary | ICD-10-CM | POA: Diagnosis not present

## 2012-03-27 DIAGNOSIS — D696 Thrombocytopenia, unspecified: Secondary | ICD-10-CM | POA: Diagnosis not present

## 2012-03-27 DIAGNOSIS — C61 Malignant neoplasm of prostate: Secondary | ICD-10-CM | POA: Diagnosis not present

## 2012-03-27 DIAGNOSIS — Z79899 Other long term (current) drug therapy: Secondary | ICD-10-CM | POA: Diagnosis not present

## 2012-03-27 DIAGNOSIS — D649 Anemia, unspecified: Secondary | ICD-10-CM | POA: Diagnosis not present

## 2012-03-29 DIAGNOSIS — M6281 Muscle weakness (generalized): Secondary | ICD-10-CM | POA: Diagnosis not present

## 2012-03-29 DIAGNOSIS — R262 Difficulty in walking, not elsewhere classified: Secondary | ICD-10-CM | POA: Diagnosis not present

## 2012-03-31 DIAGNOSIS — C61 Malignant neoplasm of prostate: Secondary | ICD-10-CM | POA: Diagnosis not present

## 2012-03-31 DIAGNOSIS — M810 Age-related osteoporosis without current pathological fracture: Secondary | ICD-10-CM | POA: Diagnosis not present

## 2012-03-31 DIAGNOSIS — D649 Anemia, unspecified: Secondary | ICD-10-CM | POA: Diagnosis not present

## 2012-03-31 DIAGNOSIS — D696 Thrombocytopenia, unspecified: Secondary | ICD-10-CM | POA: Diagnosis not present

## 2012-03-31 LAB — HEPATIC FUNCTION PANEL A (ARMC)
Albumin: 3.3 g/dL — ABNORMAL LOW (ref 3.4–5.0)
Alkaline Phosphatase: 82 U/L (ref 50–136)
Bilirubin, Direct: 0.1 mg/dL (ref 0.00–0.20)
Bilirubin,Total: 0.3 mg/dL (ref 0.2–1.0)
SGPT (ALT): 15 U/L (ref 12–78)
Total Protein: 7.2 g/dL (ref 6.4–8.2)

## 2012-03-31 LAB — CBC CANCER CENTER
Basophil #: 0.2 x10 3/mm — ABNORMAL HIGH (ref 0.0–0.1)
Basophil %: 2.2 %
Lymphocyte #: 1.9 x10 3/mm (ref 1.0–3.6)
Lymphocyte %: 24.1 %
MCH: 28.1 pg (ref 26.0–34.0)
Neutrophil #: 4.4 x10 3/mm (ref 1.4–6.5)
Platelet: 193 x10 3/mm (ref 150–440)
RBC: 3.9 10*6/uL — ABNORMAL LOW (ref 4.40–5.90)
WBC: 8.1 x10 3/mm (ref 3.8–10.6)

## 2012-03-31 LAB — CREATININE, SERUM
Creatinine: 0.98 mg/dL (ref 0.60–1.30)
EGFR (African American): >60
EGFR (Non-African Amer.): >60

## 2012-04-15 ENCOUNTER — Non-Acute Institutional Stay: Payer: Medicare Other | Admitting: Internal Medicine

## 2012-04-15 ENCOUNTER — Encounter: Payer: Self-pay | Admitting: Internal Medicine

## 2012-04-15 VITALS — BP 110/58 | HR 80 | Temp 97.2°F | Resp 22 | Wt 156.0 lb

## 2012-04-15 DIAGNOSIS — I872 Venous insufficiency (chronic) (peripheral): Secondary | ICD-10-CM

## 2012-04-15 DIAGNOSIS — C61 Malignant neoplasm of prostate: Secondary | ICD-10-CM

## 2012-04-15 DIAGNOSIS — L84 Corns and callosities: Secondary | ICD-10-CM

## 2012-04-15 DIAGNOSIS — M1710 Unilateral primary osteoarthritis, unspecified knee: Secondary | ICD-10-CM

## 2012-04-15 NOTE — Assessment & Plan Note (Deleted)
Has to push to void but still able to empty bladder as far as we can tell No meds for this

## 2012-04-15 NOTE — Assessment & Plan Note (Signed)
Right great toe by nail No ulcer Not really painful Would just watch---I would not recommend debridement but he should have nail but back more than it is now

## 2012-04-15 NOTE — Assessment & Plan Note (Signed)
Has to push to void but still able to empty bladder as far as we can tell No meds for this Had total prostatectomy but may have some other obstructive component

## 2012-04-15 NOTE — Progress Notes (Signed)
Subjective:    Patient ID: Benjamin Villarreal, male    DOB: 18-Jan-1921, 79 y.o.   MRN: [id number]  HPI Reviewed status with Albin Felling RN  Has had some discoloration in calves--right more than left Using eucerin cream but then worsened Seen in urgent care 2 weeks ago and treated with augmentin for infection Seems to be better  Now getting around easier since right TKR in December Uses the rollator still Pain is better---very little pain  Rarely uses the tramadol  Ongoing bowel problems Some constipation and has to put pressure to "force movement" Thinks he has to push to void also miralax bid and senekot  Does feel he finally empties bladder but has to push over bladder Nocturia x 1-2 No sig daytime urgency or incontinence  Concerned about left great toe Got it caught on covers at health care He pushed it away Occasional pain if pressure on it Showed podiatrist---recommended soaking with epsom salts  Current Outpatient Prescriptions on File Prior to Visit  Medication Sig Dispense Refill  . aspirin 81 MG EC tablet Take 81 mg by mouth daily.        . cholecalciferol (VITAMIN D) 1000 UNITS tablet Take 1,000 Units by mouth daily.        . ciclopirox (LOPROX) 0.77 % cream Apply topically 2 (two) times daily.        . cyanocobalamin (,VITAMIN B-12,) 1000 MCG/ML injection 1 ML "IM" EVERY MONTH  (VIT B-12 SUPPLEMENT)  30 mL  11  . cyclobenzaprine (FLEXERIL) 5 MG tablet Take 1 tablet (5 mg total) by mouth at bedtime as needed.  90 tablet  3  . hydrocortisone 2.5 % cream Apply topically daily.  454 g  1  . mineral oil-hydrophilic petrolatum (AQUAPHOR) ointment Apply 1 application topically as needed.      Marland Kitchen MIRALAX powder 1 CAPFUL(TO MARK IN CAP) OR 1 HEAPING TABLESPOON IN 8 OZ WATER TWICE A DAY  1 Bottle  6  . traMADol (ULTRAM) 50 MG tablet Take 1 tablet (50 mg total) by mouth 3 (three) times daily as needed.  270 tablet  0   No current facility-administered medications on file prior to visit.     No Known Allergies  Past Medical History  Diagnosis Date  . Osteoporosis   . Arthritis   . Prostate cancer     surgery then lupron  . Colon polyps     adenomatous  . Other constipation     chronic  . Unspecified venous (peripheral) insufficiency   . Vitamin B12 deficiency     Past Surgical History  Procedure Laterality Date  . Cataract extraction  2000, 2003    both eyes  . Basal cell carcinoma excision  2004    Left side of face  . Tibia fracture surgery  1999  . Prostate surgery  1996    cancer  . Volvulus reduction  12/10  . Joint replacement  2005    Left knee  . Joint replacement  12/13    Right knee    Family History  Problem Relation Age of Onset  . Diabetes Son   . Hypertension Paternal Grandfather     History   Social History  . Marital Status: Widowed    Spouse Name: Johnny Bridge    Number of Children: 3  . Years of Education: N/A   Occupational History  . [employer]     retired   Social History Main Topics  . Smoking status: Never Smoker   .  Smokeless tobacco: Never Used  . Alcohol Use: 0 - .5 oz/week    0-1 drink(s) per week     Comment: in past  . Drug Use: Not on file  . Sexually Active: Not on file   Other Topics Concern  . Not on file   Social History Narrative   Has living will   Daughter Cammy Brochure holds health care POA.   Has DNR and we have reviewed this.   Would not want tube feedings if cognitively unaware   Review of Systems Appetite is good Weight is stable Tries to walk some but not too much exercise    Objective:   Physical Exam  Constitutional: He appears well-developed and well-nourished. No distress.  Neck: Normal range of motion. Neck supple. No thyromegaly present.  Cardiovascular: Normal rate, regular rhythm, normal heart sounds and intact distal pulses.  Exam reveals no gallop.   No murmur heard. Pulmonary/Chest: Effort normal and breath sounds normal. No respiratory distress. He has no wheezes.  He has no rales.  Abdominal: Soft. There is no tenderness.  Musculoskeletal: He exhibits no edema and no tenderness.  Lymphadenopathy:    He has no cervical adenopathy.  Skin:  Reddish to brawny changes on calves--more on right than left. No tenderness. No open areas  Callous along lateral right great toenail No clear ingrowing but slight redness along nail bed on side  Psychiatric: He has a normal mood and affect. His behavior is normal.          Assessment & Plan:

## 2012-04-15 NOTE — Assessment & Plan Note (Signed)
Better now since right TKR Able to get around better and pain is better

## 2012-04-15 NOTE — Assessment & Plan Note (Signed)
Has chronic venous stasis changes No evidence of secondary infection now Finishing the augmentin

## 2012-04-24 ENCOUNTER — Ambulatory Visit: Payer: Self-pay | Admitting: Internal Medicine

## 2012-04-29 ENCOUNTER — Encounter: Payer: Self-pay | Admitting: Internal Medicine

## 2012-04-29 DIAGNOSIS — D518 Other vitamin B12 deficiency anemias: Secondary | ICD-10-CM | POA: Diagnosis not present

## 2012-05-05 DIAGNOSIS — M6281 Muscle weakness (generalized): Secondary | ICD-10-CM | POA: Diagnosis not present

## 2012-05-07 DIAGNOSIS — M6281 Muscle weakness (generalized): Secondary | ICD-10-CM | POA: Diagnosis not present

## 2012-05-13 DIAGNOSIS — I8310 Varicose veins of unspecified lower extremity with inflammation: Secondary | ICD-10-CM | POA: Diagnosis not present

## 2012-05-13 DIAGNOSIS — D1800 Hemangioma unspecified site: Secondary | ICD-10-CM | POA: Diagnosis not present

## 2012-05-18 DIAGNOSIS — H40009 Preglaucoma, unspecified, unspecified eye: Secondary | ICD-10-CM | POA: Diagnosis not present

## 2012-05-21 DIAGNOSIS — R262 Difficulty in walking, not elsewhere classified: Secondary | ICD-10-CM | POA: Diagnosis not present

## 2012-05-21 DIAGNOSIS — M6281 Muscle weakness (generalized): Secondary | ICD-10-CM | POA: Diagnosis not present

## 2012-06-02 DIAGNOSIS — L97509 Non-pressure chronic ulcer of other part of unspecified foot with unspecified severity: Secondary | ICD-10-CM | POA: Diagnosis not present

## 2012-06-07 ENCOUNTER — Other Ambulatory Visit: Payer: Self-pay | Admitting: *Deleted

## 2012-06-07 MED ORDER — LORATADINE 10 MG PO TABS: 10.0000 mg | ORAL_TABLET | Freq: Every day | ORAL | Status: DC

## 2012-06-14 DIAGNOSIS — Z96659 Presence of unspecified artificial knee joint: Secondary | ICD-10-CM | POA: Diagnosis not present

## 2012-06-15 DIAGNOSIS — I8310 Varicose veins of unspecified lower extremity with inflammation: Secondary | ICD-10-CM | POA: Diagnosis not present

## 2012-06-15 DIAGNOSIS — L259 Unspecified contact dermatitis, unspecified cause: Secondary | ICD-10-CM | POA: Diagnosis not present

## 2012-06-15 DIAGNOSIS — L0390 Cellulitis, unspecified: Secondary | ICD-10-CM | POA: Diagnosis not present

## 2012-06-15 DIAGNOSIS — D485 Neoplasm of uncertain behavior of skin: Secondary | ICD-10-CM | POA: Diagnosis not present

## 2012-06-15 DIAGNOSIS — D692 Other nonthrombocytopenic purpura: Secondary | ICD-10-CM | POA: Diagnosis not present

## 2012-06-29 ENCOUNTER — Ambulatory Visit: Payer: Self-pay | Admitting: Internal Medicine

## 2012-06-29 DIAGNOSIS — D649 Anemia, unspecified: Secondary | ICD-10-CM | POA: Diagnosis not present

## 2012-06-29 DIAGNOSIS — C61 Malignant neoplasm of prostate: Secondary | ICD-10-CM | POA: Diagnosis not present

## 2012-06-30 DIAGNOSIS — C61 Malignant neoplasm of prostate: Secondary | ICD-10-CM | POA: Diagnosis not present

## 2012-06-30 DIAGNOSIS — D649 Anemia, unspecified: Secondary | ICD-10-CM | POA: Diagnosis not present

## 2012-06-30 LAB — CBC CANCER CENTER
Basophil #: 0.2 x10 3/mm — ABNORMAL HIGH (ref 0.0–0.1)
Basophil %: 2.7 %
Eosinophil #: 0.6 x10 3/mm (ref 0.0–0.7)
Lymphocyte %: 28.4 %
MCV: 84 fL (ref 80–100)
Monocyte #: 0.6 x10 3/mm (ref 0.2–1.0)
Monocyte %: 8.4 %
Neutrophil #: 3.6 x10 3/mm (ref 1.4–6.5)
Neutrophil %: 52.1 %
Platelet: 152 x10 3/mm (ref 150–440)
RBC: 4.12 10*6/uL — ABNORMAL LOW (ref 4.40–5.90)
WBC: 7 x10 3/mm (ref 3.8–10.6)

## 2012-06-30 LAB — CREATININE, SERUM
Creatinine: 1 mg/dL (ref 0.60–1.30)
EGFR (African American): >60
EGFR (Non-African Amer.): >60

## 2012-07-01 LAB — PSA: PSA: 1.4 ng/mL (ref 0.0–4.0)

## 2012-07-14 DIAGNOSIS — B351 Tinea unguium: Secondary | ICD-10-CM | POA: Diagnosis not present

## 2012-07-14 DIAGNOSIS — M79609 Pain in unspecified limb: Secondary | ICD-10-CM | POA: Diagnosis not present

## 2012-07-15 ENCOUNTER — Non-Acute Institutional Stay: Payer: Medicare Other | Admitting: Internal Medicine

## 2012-07-15 ENCOUNTER — Encounter: Payer: Self-pay | Admitting: Internal Medicine

## 2012-07-15 VITALS — BP 110/52 | HR 62 | Temp 97.6°F | Resp 22 | Wt 160.0 lb

## 2012-07-15 DIAGNOSIS — C61 Malignant neoplasm of prostate: Secondary | ICD-10-CM

## 2012-07-15 DIAGNOSIS — M1710 Unilateral primary osteoarthritis, unspecified knee: Secondary | ICD-10-CM | POA: Diagnosis not present

## 2012-07-15 DIAGNOSIS — I872 Venous insufficiency (chronic) (peripheral): Secondary | ICD-10-CM | POA: Diagnosis not present

## 2012-07-15 DIAGNOSIS — K5909 Other constipation: Secondary | ICD-10-CM | POA: Diagnosis not present

## 2012-07-15 NOTE — Progress Notes (Signed)
Subjective:    Patient ID: Benjamin Villarreal, male    DOB: [birth year], 79 y.o.   MRN: [id number]  HPI Doing well No new concerns from [place] RN  Expecting his first great grandchild In [place] Will baptize when they come east to visit  Satisfied with TKR Has exercise program to strengthen legs still and working on achieving full extension of right leg now Pain is not an Education officer, environmental well with the rolator  Still problems with bowels Has to strain despite miralax bid and 4 senna per day. Eats fiber also Does go once a day No blood  Nocturia every 2 hours--stable May have to push to empty completely  Still grieves wife Some help with a support group  Current Outpatient Prescriptions on File Prior to Visit  Medication Sig Dispense Refill  . aspirin 81 MG EC tablet Take 81 mg by mouth daily.        . cholecalciferol (VITAMIN D) 1000 UNITS tablet Take 1,000 Units by mouth daily.        . ciclopirox (LOPROX) 0.77 % cream Apply topically 2 (two) times daily.        . cyanocobalamin (,VITAMIN B-12,) 1000 MCG/ML injection 1 ML "IM" EVERY MONTH  (VIT B-12 SUPPLEMENT)  30 mL  11  . cyclobenzaprine (FLEXERIL) 5 MG tablet Take 1 tablet (5 mg total) by mouth at bedtime as needed.  90 tablet  3  . hydrocortisone 2.5 % cream Apply topically daily.  454 g  1  . loratadine (CLARITIN) 10 MG tablet Take 1 tablet (10 mg total) by mouth daily.  30 tablet  11  . mineral oil-hydrophilic petrolatum (AQUAPHOR) ointment Apply 1 application topically as needed.      Marland Kitchen MIRALAX powder 1 CAPFUL(TO MARK IN CAP) OR 1 HEAPING TABLESPOON IN 8 OZ WATER TWICE A DAY  1 Bottle  6  . Potassium 99 MG TABS Take 1 tablet by mouth at bedtime. For cramps      . senna-docusate (SENNA S) 8.6-50 MG per tablet Take 4 tablets by mouth daily.       . traMADol (ULTRAM) 50 MG tablet Take 1 tablet (50 mg total) by mouth 3 (three) times daily as needed.  270 tablet  0  . vitamin C (ASCORBIC ACID) 500 MG tablet Take 500 mg by mouth daily.        No current facility-administered medications on file prior to visit.    No Known Allergies  Past Medical History  Diagnosis Date  . Osteoporosis   . Arthritis   . Prostate cancer     surgery then lupron  . Colon polyps     adenomatous  . Other constipation     chronic  . Unspecified venous (peripheral) insufficiency   . Vitamin B12 deficiency     Past Surgical History  Procedure Laterality Date  . Cataract extraction  2000, 2003    both eyes  . Basal cell carcinoma excision  2004    Left side of face  . Tibia fracture surgery  1999  . Prostate surgery  1996    cancer  . Volvulus reduction  12/10  . Joint replacement  2005    Left knee  . Joint replacement  12/13    Right knee    Family History  Problem Relation Age of Onset  . Diabetes Son   . Hypertension Paternal Grandfather     History   Social History  . Marital Status: Widowed    Spouse  Name: Johnny Bridge    Number of Children: 3  . Years of Education: N/A   Occupational History  . [employer]     retired   Social History Main Topics  . Smoking status: Never Smoker   . Smokeless tobacco: Never Used  . Alcohol Use: 0 - .5 oz/week    0-1 drink(s) per week     Comment: in past  . Drug Use: Not on file  . Sexually Active: Not on file   Other Topics Concern  . Not on file   Social History Narrative   Has living will   Daughter Cammy Brochure holds health care POA.   Has DNR and we have reviewed this.   Would not want tube feedings if cognitively unaware   Review of Systems Appetite is good Weight is up a few pounds Sleeps well No depression or anhedonia Still wears his compression hose    Objective:   Physical Exam  Constitutional: He appears well-developed and well-nourished. No distress.  Neck: Normal range of motion. Neck supple. No thyromegaly present.  Cardiovascular: Normal rate, regular rhythm, normal heart sounds and intact distal pulses.  Exam reveals no gallop.   No  murmur heard. Pulmonary/Chest: Effort normal and breath sounds normal. No respiratory distress. He has no wheezes. He has no rales.  Abdominal: Soft. There is no tenderness.  Musculoskeletal: He exhibits no edema and no tenderness.  Lymphadenopathy:    He has no cervical adenopathy.  Psychiatric: He has a normal mood and affect. His behavior is normal.          Assessment & Plan:

## 2012-07-15 NOTE — Assessment & Plan Note (Signed)
Pain has resolved Still working on optimal strength and ROM

## 2012-07-15 NOTE — Assessment & Plan Note (Signed)
Continues to have problems despite aggressive regimen Discussed considering increasing one of his meds still Doesn't seem to need cathartic like MOM

## 2012-07-15 NOTE — Assessment & Plan Note (Signed)
No recurrence but bladder emptying troubles He does okay with this

## 2012-07-15 NOTE — Assessment & Plan Note (Signed)
Does fine with the support hose

## 2012-07-25 ENCOUNTER — Ambulatory Visit: Payer: Self-pay | Admitting: Internal Medicine

## 2012-08-02 ENCOUNTER — Other Ambulatory Visit: Payer: Self-pay | Admitting: *Deleted

## 2012-08-02 NOTE — Telephone Encounter (Signed)
Ok to fill? [facility]

## 2012-08-03 MED ORDER — POLYETHYLENE GLYCOL 3350 17 GM/SCOOP PO POWD: 17.0000 g | Freq: Two times a day (BID) | ORAL | Status: DC

## 2012-08-03 MED ORDER — TRAMADOL HCL 50 MG PO TABS: 50.0000 mg | ORAL_TABLET | Freq: Three times a day (TID) | ORAL | Status: DC | PRN

## 2012-08-03 NOTE — Telephone Encounter (Signed)
rx sent to pharmacy by [facility]  

## 2012-08-03 NOTE — Telephone Encounter (Signed)
Okay tramadol #270 x 0 Polyethylene glycol for a year

## 2012-08-12 ENCOUNTER — Telehealth: Payer: Self-pay

## 2012-08-12 NOTE — Telephone Encounter (Signed)
Please call [name] to find out why he is requesting this refill.

## 2012-08-12 NOTE — Telephone Encounter (Signed)
Sent email to [place] on pt, waiting on her reply.  Benjamin Villarreal, we received a call this morning from [facility] on pt Benjamin Villarreal, trying to fill CLINDAMYCIN 150 MG, this is not on his med list, do you know why he's requesting this?

## 2012-08-12 NOTE — Telephone Encounter (Signed)
[facility] request refill on clindamycin 150 mg per pt request; Not on pt's current or historical med list; this [facility] has no hx. Ref # [id number].Please advise.

## 2012-08-16 NOTE — Telephone Encounter (Signed)
Per Dr. Alphonsus Sias pt doesn't need this

## 2012-08-17 DIAGNOSIS — B079 Viral wart, unspecified: Secondary | ICD-10-CM | POA: Diagnosis not present

## 2012-08-17 DIAGNOSIS — L259 Unspecified contact dermatitis, unspecified cause: Secondary | ICD-10-CM | POA: Diagnosis not present

## 2012-08-17 DIAGNOSIS — D1800 Hemangioma unspecified site: Secondary | ICD-10-CM | POA: Diagnosis not present

## 2012-08-17 DIAGNOSIS — L821 Other seborrheic keratosis: Secondary | ICD-10-CM | POA: Diagnosis not present

## 2012-08-17 DIAGNOSIS — I8310 Varicose veins of unspecified lower extremity with inflammation: Secondary | ICD-10-CM | POA: Diagnosis not present

## 2012-08-17 DIAGNOSIS — D692 Other nonthrombocytopenic purpura: Secondary | ICD-10-CM | POA: Diagnosis not present

## 2012-08-19 DIAGNOSIS — D518 Other vitamin B12 deficiency anemias: Secondary | ICD-10-CM | POA: Diagnosis not present

## 2012-08-20 ENCOUNTER — Encounter: Payer: Self-pay | Admitting: Internal Medicine

## 2012-08-31 ENCOUNTER — Ambulatory Visit: Payer: Self-pay | Admitting: Internal Medicine

## 2012-08-31 DIAGNOSIS — M1990 Unspecified osteoarthritis, unspecified site: Secondary | ICD-10-CM | POA: Diagnosis not present

## 2012-08-31 DIAGNOSIS — C61 Malignant neoplasm of prostate: Secondary | ICD-10-CM | POA: Diagnosis not present

## 2012-08-31 DIAGNOSIS — Z8546 Personal history of malignant neoplasm of prostate: Secondary | ICD-10-CM | POA: Diagnosis not present

## 2012-08-31 DIAGNOSIS — Z79899 Other long term (current) drug therapy: Secondary | ICD-10-CM | POA: Diagnosis not present

## 2012-08-31 DIAGNOSIS — D649 Anemia, unspecified: Secondary | ICD-10-CM | POA: Diagnosis not present

## 2012-08-31 DIAGNOSIS — D696 Thrombocytopenia, unspecified: Secondary | ICD-10-CM | POA: Diagnosis not present

## 2012-08-31 DIAGNOSIS — M129 Arthropathy, unspecified: Secondary | ICD-10-CM | POA: Diagnosis not present

## 2012-08-31 DIAGNOSIS — M810 Age-related osteoporosis without current pathological fracture: Secondary | ICD-10-CM | POA: Diagnosis not present

## 2012-08-31 DIAGNOSIS — Z79818 Long term (current) use of other agents affecting estrogen receptors and estrogen levels: Secondary | ICD-10-CM | POA: Diagnosis not present

## 2012-08-31 DIAGNOSIS — Z7982 Long term (current) use of aspirin: Secondary | ICD-10-CM | POA: Diagnosis not present

## 2012-09-01 DIAGNOSIS — C61 Malignant neoplasm of prostate: Secondary | ICD-10-CM | POA: Diagnosis not present

## 2012-09-01 DIAGNOSIS — Z79818 Long term (current) use of other agents affecting estrogen receptors and estrogen levels: Secondary | ICD-10-CM | POA: Diagnosis not present

## 2012-09-01 DIAGNOSIS — D696 Thrombocytopenia, unspecified: Secondary | ICD-10-CM | POA: Diagnosis not present

## 2012-09-01 DIAGNOSIS — D649 Anemia, unspecified: Secondary | ICD-10-CM | POA: Diagnosis not present

## 2012-09-01 LAB — HEPATIC FUNCTION PANEL A (ARMC)
Alkaline Phosphatase: 70 U/L (ref 50–136)
Bilirubin, Direct: <0.1 mg/dL (ref 0.00–0.20)
Bilirubin,Total: 0.2 mg/dL (ref 0.2–1.0)
SGPT (ALT): 14 U/L (ref 12–78)
Total Protein: 7.2 g/dL (ref 6.4–8.2)

## 2012-09-01 LAB — CBC CANCER CENTER
Basophil #: 0.1 x10 3/mm (ref 0.0–0.1)
Basophil %: 2.1 %
Eosinophil %: 5.7 %
HCT: 33.9 % — ABNORMAL LOW (ref 40.0–52.0)
HGB: 11.3 g/dL — ABNORMAL LOW (ref 13.0–18.0)
MCV: 84 fL (ref 80–100)
Monocyte #: 0.5 x10 3/mm (ref 0.2–1.0)
Neutrophil %: 57.9 %
RBC: 4.05 10*6/uL — ABNORMAL LOW (ref 4.40–5.90)
WBC: 6.6 x10 3/mm (ref 3.8–10.6)

## 2012-09-01 LAB — CREATININE, SERUM
Creatinine: 1.23 mg/dL (ref 0.60–1.30)
EGFR (African American): 60 — ABNORMAL LOW

## 2012-09-02 LAB — PSA: PSA: 1.7 ng/mL (ref 0.0–4.0)

## 2012-09-07 DIAGNOSIS — L821 Other seborrheic keratosis: Secondary | ICD-10-CM | POA: Diagnosis not present

## 2012-09-07 DIAGNOSIS — L578 Other skin changes due to chronic exposure to nonionizing radiation: Secondary | ICD-10-CM | POA: Diagnosis not present

## 2012-09-07 DIAGNOSIS — L259 Unspecified contact dermatitis, unspecified cause: Secondary | ICD-10-CM | POA: Diagnosis not present

## 2012-09-07 DIAGNOSIS — Z85828 Personal history of other malignant neoplasm of skin: Secondary | ICD-10-CM | POA: Diagnosis not present

## 2012-09-07 DIAGNOSIS — I8310 Varicose veins of unspecified lower extremity with inflammation: Secondary | ICD-10-CM | POA: Diagnosis not present

## 2012-09-24 ENCOUNTER — Ambulatory Visit: Payer: Self-pay | Admitting: Internal Medicine

## 2012-09-29 ENCOUNTER — Ambulatory Visit: Payer: Medicare Other | Admitting: Family Medicine

## 2012-09-29 ENCOUNTER — Encounter: Payer: Self-pay | Admitting: Family Medicine

## 2012-09-29 ENCOUNTER — Ambulatory Visit (INDEPENDENT_AMBULATORY_CARE_PROVIDER_SITE_OTHER): Payer: Medicare Other | Admitting: Family Medicine

## 2012-09-29 ENCOUNTER — Telehealth: Payer: Self-pay

## 2012-09-29 VITALS — BP 108/56 | HR 77 | Temp 98.6°F | Wt 161.5 lb

## 2012-09-29 DIAGNOSIS — IMO0002 Reserved for concepts with insufficient information to code with codable children: Secondary | ICD-10-CM

## 2012-09-29 MED ORDER — SULFAMETHOXAZOLE-TMP DS 800-160 MG PO TABS: 2.0000 | ORAL_TABLET | Freq: Two times a day (BID) | ORAL | Status: DC

## 2012-09-29 NOTE — Telephone Encounter (Signed)
Since I was covering for potential MRSA- I did order it for 2 tabs bid- however if that is contraindicated for his age- we can decrease to 1 pill bid

## 2012-09-29 NOTE — Progress Notes (Signed)
Subjective:    Patient ID: Benjamin Villarreal, male    DOB: 1920-03-30, 79 y.o.   MRN: [id number]  HPI Here for a lump on his forearm  Is red and a bit tender Wonders if it could be an infection  Not aware of an injury or an insect bite  - noticed it about a week ago-  ? Getting worse   No fever   Patient Active Problem List   Diagnosis Date Noted  . Actinic keratosis 10/17/2010  . BACK PAIN 02/14/2010  . VITAMIN B12 DEFICIENCY 12/13/2009  . ADENOCARCINOMA, PROSTATE 03/22/2009  . DEGENERATIVE JOINT DISEASE, BOTH KNEES, SEVERE 12/13/2008  . VENOUS INSUFFICIENCY, CHRONIC 09/07/2008  . CONSTIPATION, CHRONIC 09/07/2008  . OSTEOARTHRITIS 09/05/2008  . OSTEOPOROSIS 09/05/2008  . PROSTATE CANCER, HX OF 09/05/2008  . COLONIC POLYPS, HX OF 09/05/2008   Past Medical History  Diagnosis Date  . Osteoporosis   . Arthritis   . Prostate cancer     surgery then lupron  . Colon polyps     adenomatous  . Other constipation     chronic  . Unspecified venous (peripheral) insufficiency   . Vitamin B12 deficiency    Past Surgical History  Procedure Laterality Date  . Cataract extraction  2000, 2003    both eyes  . Basal cell carcinoma excision  2004    Left side of face  . Tibia fracture surgery  1999  . Prostate surgery  1996    cancer  . Volvulus reduction  12/10  . Joint replacement  2005    Left knee  . Joint replacement  12/13    Right knee   History  Substance Use Topics  . Smoking status: Never Smoker   . Smokeless tobacco: Never Used  . Alcohol Use: 0 - .5 oz/week    0-1 drink(s) per week     Comment: in past   Family History  Problem Relation Age of Onset  . Diabetes Son   . Hypertension Paternal Grandfather    No Known Allergies Current Outpatient Prescriptions on File Prior to Visit  Medication Sig Dispense Refill  . aspirin 81 MG EC tablet Take 81 mg by mouth daily.        . cholecalciferol (VITAMIN D) 1000 UNITS tablet Take 1,000 Units by mouth daily.        .  ciclopirox (LOPROX) 0.77 % cream Apply topically 2 (two) times daily.        . cyanocobalamin (,VITAMIN B-12,) 1000 MCG/ML injection 1 ML "IM" EVERY MONTH  (VIT B-12 SUPPLEMENT)  30 mL  11  . cyclobenzaprine (FLEXERIL) 5 MG tablet Take 1 tablet (5 mg total) by mouth at bedtime as needed.  90 tablet  3  . hydrocortisone 2.5 % cream Apply topically daily.  454 g  1  . mineral oil-hydrophilic petrolatum (AQUAPHOR) ointment Apply 1 application topically. APPLY 1 APPLICATION ON LEFT BIG TOE DAILY      . polyethylene glycol powder (MIRALAX) powder Take 17 g by mouth 2 (two) times daily.  527 g  3  . senna-docusate (SENNA S) 8.6-50 MG per tablet Take 4 tablets by mouth daily.       . traMADol (ULTRAM) 50 MG tablet Take 1 tablet (50 mg total) by mouth 3 (three) times daily as needed.  270 tablet  0  . vitamin C (ASCORBIC ACID) 500 MG tablet Take 500 mg by mouth daily.      Marland Kitchen loratadine (CLARITIN) 10 MG tablet Take 1  tablet (10 mg total) by mouth daily.  30 tablet  11   No current facility-administered medications on file prior to visit.      Review of Systems Review of Systems  Constitutional: Negative for fever, appetite change, fatigue and unexpected weight change.  Eyes: Negative for pain and visual disturbance.  Respiratory: Negative for cough and shortness of breath.   Cardiovascular: Negative for cp or palpitations    Gastrointestinal: Negative for nausea, diarrhea and constipation.  Genitourinary: Negative for urgency and frequency.  Skin: Negative for pallor or rash  pos for itchy skin spots chronically (dermatitis), pos for a red painful area today Neurological: Negative for weakness, light-headedness, numbness and headaches.  Hematological: Negative for adenopathy. Does not bruise/bleed easily.  Psychiatric/Behavioral: Negative for dysphoric mood. The patient is not nervous/anxious.         Objective:   Physical Exam  Constitutional: He appears well-developed and well-nourished. No  distress.  Well appearing elderly male  Eyes: Conjunctivae and EOM are normal. Pupils are equal, round, and reactive to light.  Neck: Normal range of motion. Neck supple.  Cardiovascular: Normal rate and regular rhythm.   Pulmonary/Chest: Effort normal and breath sounds normal.  Musculoskeletal: He exhibits tenderness.  Lymphadenopathy:    He has no cervical adenopathy.  Neurological: He is alert. He has normal reflexes. No cranial nerve deficit. He exhibits normal muscle tone. Coordination normal.  Skin: Skin is warm and dry. There is erythema.  3 by 3 cm oval area of erythema and induration with small scab on R medial forearm Not fluctuant and no drainage Mildly tender   Psychiatric: He has a normal mood and affect.          Assessment & Plan:

## 2012-09-29 NOTE — Patient Instructions (Addendum)
I think you have a skin infection Keep the area clean with antibacterial soap and water- and if it drains- put a loose dressing over it  Take the antibiotic as directed and finish it all  Update me if this gets worse or not improving in several days (or if you feel ill or get a fever)

## 2012-09-29 NOTE — Telephone Encounter (Signed)
Rick with [employer] left v/m requesting cb to clarify instructions for Bactrim DS taking 2 tabs twice a day.

## 2012-09-29 NOTE — Telephone Encounter (Signed)
Pharmacist advise that if we are treating for MRSA then 2 tab BID is correct dose they just wanted to double check

## 2012-09-30 NOTE — Assessment & Plan Note (Signed)
Firm area of erythema and induration of R forearm with scab Will cover for MRSA with bactrim DS inst to use warm compress/ cover loosely and f/u if it begins to look like an abscess or it pain/ swelling worsen

## 2012-10-01 ENCOUNTER — Encounter: Payer: Self-pay | Admitting: Family Medicine

## 2012-10-01 ENCOUNTER — Ambulatory Visit (INDEPENDENT_AMBULATORY_CARE_PROVIDER_SITE_OTHER): Payer: Medicare Other | Admitting: Family Medicine

## 2012-10-01 VITALS — BP 102/50 | HR 56 | Temp 97.4°F | Wt 159.2 lb

## 2012-10-01 DIAGNOSIS — IMO0002 Reserved for concepts with insufficient information to code with codable children: Secondary | ICD-10-CM

## 2012-10-01 NOTE — Patient Instructions (Addendum)
Keep taking the antibiotics and using warm compresses and this should gradually drain.  Take care.  Keep it clean and covered.  Glad to see you.

## 2012-10-03 NOTE — Progress Notes (Signed)
F/u for boil. Recent note reviewed.  Has drained some.  No fevers.  Slightly tender, but not more than prev.  Feels well o/w.   Meds, vitals, and allergies reviewed.   ROS: See HPI.  Otherwise, noncontributory.  Pt examined with Dr. Milinda Antis nad Compared to prev, R arm with softer boil and it is starting to drain spontaneously.  No spreading erythema.

## 2012-10-03 NOTE — Assessment & Plan Note (Signed)
Softer, draining, improved.  No fevers.  Would continue warm compresses and abx, keep covered.  Fu prn.  He agrees.

## 2012-10-13 DIAGNOSIS — M79609 Pain in unspecified limb: Secondary | ICD-10-CM | POA: Diagnosis not present

## 2012-10-13 DIAGNOSIS — B351 Tinea unguium: Secondary | ICD-10-CM | POA: Diagnosis not present

## 2012-10-21 ENCOUNTER — Non-Acute Institutional Stay: Payer: Medicare Other | Admitting: Internal Medicine

## 2012-10-21 ENCOUNTER — Encounter: Payer: Self-pay | Admitting: Internal Medicine

## 2012-10-21 VITALS — BP 118/70 | HR 70 | Temp 97.7°F | Resp 18 | Wt 161.0 lb

## 2012-10-21 DIAGNOSIS — K5909 Other constipation: Secondary | ICD-10-CM | POA: Diagnosis not present

## 2012-10-21 DIAGNOSIS — M1710 Unilateral primary osteoarthritis, unspecified knee: Secondary | ICD-10-CM | POA: Diagnosis not present

## 2012-10-21 DIAGNOSIS — IMO0002 Reserved for concepts with insufficient information to code with codable children: Secondary | ICD-10-CM

## 2012-10-21 DIAGNOSIS — I872 Venous insufficiency (chronic) (peripheral): Secondary | ICD-10-CM | POA: Diagnosis not present

## 2012-10-21 DIAGNOSIS — J301 Allergic rhinitis due to pollen: Secondary | ICD-10-CM

## 2012-10-21 NOTE — Assessment & Plan Note (Signed)
Better since TKR Hasn't needed analgesics Able to walk and go to gym regularly now

## 2012-10-21 NOTE — Assessment & Plan Note (Signed)
Satisfied with current regimen

## 2012-10-21 NOTE — Assessment & Plan Note (Signed)
Increasing symptoms Will have him use the loratadine

## 2012-10-21 NOTE — Assessment & Plan Note (Signed)
Has not been swelling lately

## 2012-10-21 NOTE — Progress Notes (Signed)
Subjective:    Patient ID: Benjamin Villarreal, male    DOB: [birth year], 79 y.o.   MRN: [id number]  HPI Reviewed status with [facility] RN  Doing well He feels his arm infection is healed up--but wants it checked No fever No discharge  Arthritis is okay Now able to walk more outside and works on machines in gym Hasn't needed the pain meds  Bowels are okay Needs frequent miralax and senekot--but this is effective Able to go after breakfast daily  Voids okay Some nocturia--sits on the commode  Has noted some increased nasal congestion and drainage Hadn't been using the med  Current Outpatient Prescriptions on File Prior to Visit  Medication Sig Dispense Refill  . aspirin 81 MG EC tablet Take 81 mg by mouth daily.        . cholecalciferol (VITAMIN D) 1000 UNITS tablet Take 1,000 Units by mouth daily.        . ciclopirox (LOPROX) 0.77 % cream Apply topically 2 (two) times daily.        . clindamycin (CLEOCIN) 150 MG capsule Take 150 mg by mouth. TAKES 1 HOUR PRIOR TO DENTAL WORK      . cyanocobalamin (,VITAMIN B-12,) 1000 MCG/ML injection 1 ML "IM" EVERY MONTH  (VIT B-12 SUPPLEMENT)  30 mL  11  . cyclobenzaprine (FLEXERIL) 5 MG tablet Take 1 tablet (5 mg total) by mouth at bedtime as needed.  90 tablet  3  . hydrocortisone 2.5 % cream Apply topically daily.  454 g  1  . loratadine (CLARITIN) 10 MG tablet Take 1 tablet (10 mg total) by mouth daily.  30 tablet  11  . mineral oil-hydrophilic petrolatum (AQUAPHOR) ointment Apply 1 application topically. APPLY 1 APPLICATION ON LEFT BIG TOE DAILY      . polyethylene glycol powder (MIRALAX) powder Take 17 g by mouth 2 (two) times daily.  527 g  3  . senna-docusate (SENNA S) 8.6-50 MG per tablet Take 4 tablets by mouth daily.       . traMADol (ULTRAM) 50 MG tablet Take 1 tablet (50 mg total) by mouth 3 (three) times daily as needed.  270 tablet  0  . triamcinolone cream (KENALOG) 0.1 % Apply topically 2 (two) times daily.      . vitamin C (ASCORBIC  ACID) 500 MG tablet Take 500 mg by mouth daily.       No current facility-administered medications on file prior to visit.    No Known Allergies  Past Medical History  Diagnosis Date  . Osteoporosis   . Arthritis   . Prostate cancer     surgery then lupron  . Colon polyps     adenomatous  . Other constipation     chronic  . Unspecified venous (peripheral) insufficiency   . Vitamin B12 deficiency     Past Surgical History  Procedure Laterality Date  . Cataract extraction  2000, 2003    both eyes  . Basal cell carcinoma excision  2004    Left side of face  . Tibia fracture surgery  1999  . Prostate surgery  1996    cancer  . Volvulus reduction  12/10  . Joint replacement  2005    Left knee  . Joint replacement  12/13    Right knee    Family History  Problem Relation Age of Onset  . Diabetes Son   . Hypertension Paternal Grandfather     History   Social History  . Marital Status: Widowed  Spouse Name: Johnny Bridge    Number of Children: 3  . Years of Education: N/A   Occupational History  . [employer]     retired   Social History Main Topics  . Smoking status: Never Smoker   . Smokeless tobacco: Never Used  . Alcohol Use: 0 - .5 oz/week    0-1 drink(s) per week     Comment: in past  . Drug Use: Not on file  . Sexual Activity: Not on file   Other Topics Concern  . Not on file   Social History Narrative   Has living will   Daughter Cammy Brochure holds health care POA.   Has DNR and we have reviewed this.   Would not want tube feedings if cognitively unaware   Review of Systems Sleeps well Appetite is fine Weight is stable    Objective:   Physical Exam  Constitutional: He appears well-developed and well-nourished. No distress.  Neck: Normal range of motion. Neck supple. No thyromegaly present.  Cardiovascular: Normal rate, regular rhythm and normal heart sounds.  Exam reveals no gallop.   No murmur heard. Pulmonary/Chest: Effort normal  and breath sounds normal. No respiratory distress. He has no wheezes. He has no rales.  Abdominal: Soft. There is no tenderness.  Musculoskeletal: He exhibits no edema and no tenderness.  Lymphadenopathy:    He has no cervical adenopathy.  Skin:  Right arm lesion is healed (just some hyperpigmentation)  Psychiatric: He has a normal mood and affect. His behavior is normal.          Assessment & Plan:

## 2012-11-04 ENCOUNTER — Other Ambulatory Visit: Payer: Self-pay | Admitting: *Deleted

## 2012-11-04 MED ORDER — VITAMIN B-12 1000 MCG PO TABS: 1000.0000 ug | ORAL_TABLET | Freq: Every day | ORAL | Status: DC

## 2012-11-04 MED ORDER — LORATADINE 10 MG PO TABS: 10.0000 mg | ORAL_TABLET | Freq: Every day | ORAL | Status: DC

## 2012-11-16 DIAGNOSIS — H40009 Preglaucoma, unspecified, unspecified eye: Secondary | ICD-10-CM | POA: Diagnosis not present

## 2012-11-16 DIAGNOSIS — H532 Diplopia: Secondary | ICD-10-CM | POA: Diagnosis not present

## 2012-12-02 ENCOUNTER — Ambulatory Visit: Payer: Self-pay | Admitting: Internal Medicine

## 2012-12-02 DIAGNOSIS — C61 Malignant neoplasm of prostate: Secondary | ICD-10-CM | POA: Diagnosis not present

## 2012-12-02 LAB — CBC CANCER CENTER
Basophil %: 2.4 %
Eosinophil %: 4.8 %
Lymphocyte %: 33.3 %
MCH: 28.4 pg (ref 26.0–34.0)
MCHC: 33.7 g/dL (ref 32.0–36.0)
Monocyte %: 10.3 %
Platelet: 161 x10 3/mm (ref 150–440)
RBC: 4.1 10*6/uL — ABNORMAL LOW (ref 4.40–5.90)
RDW: 15.2 % — ABNORMAL HIGH (ref 11.5–14.5)

## 2012-12-02 LAB — CREATININE, SERUM
Creatinine: 0.99 mg/dL (ref 0.60–1.30)
EGFR (Non-African Amer.): >60

## 2012-12-03 LAB — PSA: PSA: 1.9 ng/mL (ref 0.0–4.0)

## 2012-12-15 DIAGNOSIS — H532 Diplopia: Secondary | ICD-10-CM | POA: Diagnosis not present

## 2012-12-16 ENCOUNTER — Encounter: Payer: Self-pay | Admitting: Internal Medicine

## 2012-12-16 ENCOUNTER — Non-Acute Institutional Stay: Payer: Medicare Other | Admitting: Internal Medicine

## 2012-12-16 VITALS — BP 110/50 | HR 56 | Resp 24 | Wt 164.0 lb

## 2012-12-16 DIAGNOSIS — M1710 Unilateral primary osteoarthritis, unspecified knee: Secondary | ICD-10-CM

## 2012-12-16 DIAGNOSIS — K5909 Other constipation: Secondary | ICD-10-CM | POA: Diagnosis not present

## 2012-12-16 DIAGNOSIS — IMO0002 Reserved for concepts with insufficient information to code with codable children: Secondary | ICD-10-CM

## 2012-12-16 DIAGNOSIS — R42 Dizziness and giddiness: Secondary | ICD-10-CM

## 2012-12-16 DIAGNOSIS — I872 Venous insufficiency (chronic) (peripheral): Secondary | ICD-10-CM

## 2012-12-16 NOTE — Progress Notes (Signed)
Subjective:    Patient ID: Benjamin Villarreal, male    DOB: [birth year], 79 y.o.   MRN: [id number]  HPI Asked to see semi emergently but resident and Benjamin Chessman RN  He went to bathroom 3 nights ago Sat down but then had almost "black out" when walking back to bed Able to lean on sink and pulled cord---got help almost immediately Helped back to bed  Doing some better then Did have transportation for eye appt yesterday --didn't feel that he wanted to drive  This early am--got up for bathroom again and felt some spinning sensation and was unstable on his feet This morning felt unstable and asked for wheelchair No nausea No chest pain-- or just a twinge in left chest when leaning over to pull over his compression sock  Knee pain almost gone No regular meds  Bowels are okay Using all his usual meds Appetite is fine  No problems with edema Wears compression socks (usually just on right side to protect left great toe)  He stopped ASA about a month ago due to concerns with bleeding  Current Outpatient Prescriptions on File Prior to Visit  Medication Sig Dispense Refill  . aspirin 81 MG EC tablet Take 81 mg by mouth daily.        . cholecalciferol (VITAMIN D) 1000 UNITS tablet Take 1,000 Units by mouth daily.        . ciclopirox (LOPROX) 0.77 % cream Apply topically 2 (two) times daily.        . clindamycin (CLEOCIN) 150 MG capsule Take 150 mg by mouth. TAKES 1 HOUR PRIOR TO DENTAL WORK      . cyclobenzaprine (FLEXERIL) 5 MG tablet Take 1 tablet (5 mg total) by mouth at bedtime as needed.  90 tablet  3  . hydrocortisone 2.5 % cream Apply topically daily.  454 g  1  . loratadine (CLARITIN) 10 MG tablet Take 1 tablet (10 mg total) by mouth daily.  90 tablet  3  . mineral oil-hydrophilic petrolatum (AQUAPHOR) ointment Apply 1 application topically. APPLY 1 APPLICATION ON LEFT BIG TOE DAILY      . polyethylene glycol powder (MIRALAX) powder Take 17 g by mouth 2 (two) times daily.  527 g  3  .  senna-docusate (SENNA S) 8.6-50 MG per tablet Take 4 tablets by mouth daily.       . traMADol (ULTRAM) 50 MG tablet Take 1 tablet (50 mg total) by mouth 3 (three) times daily as needed.  270 tablet  0  . triamcinolone cream (KENALOG) 0.1 % Apply topically 2 (two) times daily.      . vitamin B-12 (CYANOCOBALAMIN) 1000 MCG tablet Take 1 tablet (1,000 mcg total) by mouth daily.  90 tablet  3  . vitamin C (ASCORBIC ACID) 500 MG tablet Take 500 mg by mouth daily.       No current facility-administered medications on file prior to visit.    No Known Allergies  Past Medical History  Diagnosis Date  . Osteoporosis   . Arthritis   . Prostate cancer     surgery then lupron  . Colon polyps     adenomatous  . Other constipation     chronic  . Unspecified venous (peripheral) insufficiency   . Vitamin B12 deficiency     Past Surgical History  Procedure Laterality Date  . Cataract extraction  2000, 2003    both eyes  . Basal cell carcinoma excision  2004    Left side of face  .  Tibia fracture surgery  1999  . Prostate surgery  1996    cancer  . Volvulus reduction  12/10  . Joint replacement  2005    Left knee  . Joint replacement  12/13    Right knee    Family History  Problem Relation Age of Onset  . Diabetes Son   . Hypertension Paternal Grandfather     History   Social History  . Marital Status: Widowed    Spouse Name: Johnny Bridge    Number of Children: 3  . Years of Education: N/A   Occupational History  . [employer]     retired   Social History Main Topics  . Smoking status: Never Smoker   . Smokeless tobacco: Never Used  . Alcohol Use: 0 - .5 oz/week    0-1 drink(s) per week     Comment: in past  . Drug Use: Not on file  . Sexual Activity: Not on file   Other Topics Concern  . Not on file   Social History Narrative   Has living will   Daughter Cammy Brochure holds health care POA.   Has DNR and we have reviewed this.   Would not want tube feedings if  cognitively unaware   Review of Systems New hearing aides Stopped irrigating canals though Hearing seemed off in the past couple of days--better now No tinnitus Had diplopia after cataract surgery---now has prism on glasses    Objective:   Physical Exam  Constitutional: He appears well-developed and well-nourished. No distress.  Neck: Normal range of motion. Neck supple. No thyromegaly present.  Cardiovascular: Normal rate, regular rhythm and normal heart sounds.  Exam reveals no gallop.   No murmur heard. Pulmonary/Chest: Effort normal and breath sounds normal. No respiratory distress. He has no wheezes. He has no rales.  Musculoskeletal: He exhibits no edema and no tenderness.  Lymphadenopathy:    He has no cervical adenopathy.  Neurological: No cranial nerve deficit. He exhibits normal muscle tone. Coordination normal.  No focal weakness Able to walk with rollator but not with confidence  Psychiatric: He has a normal mood and affect. His behavior is normal.          Assessment & Plan:

## 2012-12-16 NOTE — Assessment & Plan Note (Signed)
Better since surgery Not regularly needing meds

## 2012-12-16 NOTE — Assessment & Plan Note (Signed)
Okay on current regimen 

## 2012-12-16 NOTE — Assessment & Plan Note (Signed)
Had what seemed to be vagal spell 3 days ago---but had actual vertigo today No nystagmus  No true ataxia but unstable walking. No other focal deficits May be vestibular but I am concerned about brain stem ischemia  Will try meclizine prn Urged him to restart the aspirin at 81mg  every other day

## 2012-12-16 NOTE — Assessment & Plan Note (Signed)
Controlled with support socks 

## 2012-12-21 DIAGNOSIS — H6120 Impacted cerumen, unspecified ear: Secondary | ICD-10-CM | POA: Diagnosis not present

## 2012-12-21 DIAGNOSIS — H905 Unspecified sensorineural hearing loss: Secondary | ICD-10-CM | POA: Diagnosis not present

## 2012-12-21 DIAGNOSIS — R42 Dizziness and giddiness: Secondary | ICD-10-CM | POA: Diagnosis not present

## 2012-12-21 DIAGNOSIS — H903 Sensorineural hearing loss, bilateral: Secondary | ICD-10-CM | POA: Diagnosis not present

## 2012-12-22 ENCOUNTER — Encounter: Payer: Self-pay | Admitting: Internal Medicine

## 2012-12-22 ENCOUNTER — Ambulatory Visit (INDEPENDENT_AMBULATORY_CARE_PROVIDER_SITE_OTHER): Payer: Medicare Other | Admitting: Internal Medicine

## 2012-12-22 VITALS — BP 120/70 | HR 72 | Temp 98.0°F | Wt 163.0 lb

## 2012-12-22 DIAGNOSIS — I6789 Other cerebrovascular disease: Secondary | ICD-10-CM | POA: Diagnosis not present

## 2012-12-22 DIAGNOSIS — G463 Brain stem stroke syndrome: Secondary | ICD-10-CM

## 2012-12-22 DIAGNOSIS — I498 Other specified cardiac arrhythmias: Secondary | ICD-10-CM | POA: Diagnosis not present

## 2012-12-22 DIAGNOSIS — R001 Bradycardia, unspecified: Secondary | ICD-10-CM

## 2012-12-22 NOTE — Progress Notes (Signed)
Subjective:    Patient ID: Benjamin Villarreal, male    DOB: 05-Apr-1920, 79 y.o.   MRN: [id number]  HPI Still having dizziness  Went to ENT after RN saw something on his ear drum Had testing for BPPV and this provocative testing didn't cause the dizziness HR was in the 40's while there  His dizziness is less noticeable now Has sense of right side weakness Has had sense "that I would pitch to the right"---- this was before my initial visit for this  Still has the weakness ---using wheelchair to get around still Can walk in apartment briefly No sense of vertigo or movement now Denies major orthostatic dizziness--taking it very slow  Current Outpatient Prescriptions on File Prior to Visit  Medication Sig Dispense Refill  . aspirin 81 MG EC tablet Take 81 mg by mouth every other day.       . cholecalciferol (VITAMIN D) 1000 UNITS tablet Take 1,000 Units by mouth daily.        . ciclopirox (LOPROX) 0.77 % cream Apply topically 2 (two) times daily.        . clindamycin (CLEOCIN) 150 MG capsule Take 150 mg by mouth. TAKES 1 HOUR PRIOR TO DENTAL WORK      . cyclobenzaprine (FLEXERIL) 5 MG tablet Take 1 tablet (5 mg total) by mouth at bedtime as needed.  90 tablet  3  . hydrocortisone 2.5 % cream Apply topically daily.  454 g  1  . loratadine (CLARITIN) 10 MG tablet Take 1 tablet (10 mg total) by mouth daily.  90 tablet  3  . mineral oil-hydrophilic petrolatum (AQUAPHOR) ointment Apply 1 application topically. APPLY 1 APPLICATION ON LEFT BIG TOE DAILY      . polyethylene glycol powder (MIRALAX) powder Take 17 g by mouth 2 (two) times daily.  527 g  3  . senna-docusate (SENNA S) 8.6-50 MG per tablet Take 4 tablets by mouth daily.       . traMADol (ULTRAM) 50 MG tablet Take 1 tablet (50 mg total) by mouth 3 (three) times daily as needed.  270 tablet  0  . triamcinolone cream (KENALOG) 0.1 % Apply topically 2 (two) times daily.      . vitamin B-12 (CYANOCOBALAMIN) 1000 MCG tablet Take 1 tablet (1,000  mcg total) by mouth daily.  90 tablet  3  . vitamin C (ASCORBIC ACID) 500 MG tablet Take 500 mg by mouth daily.       No current facility-administered medications on file prior to visit.    No Known Allergies  Past Medical History  Diagnosis Date  . Osteoporosis   . Arthritis   . Prostate cancer     surgery then lupron  . Colon polyps     adenomatous  . Other constipation     chronic  . Unspecified venous (peripheral) insufficiency   . Vitamin B12 deficiency     Past Surgical History  Procedure Laterality Date  . Cataract extraction  2000, 2003    both eyes  . Basal cell carcinoma excision  2004    Left side of face  . Tibia fracture surgery  1999  . Prostate surgery  1996    cancer  . Volvulus reduction  12/10  . Joint replacement  2005    Left knee  . Joint replacement  12/13    Right knee    Family History  Problem Relation Age of Onset  . Diabetes Son   . Hypertension Paternal Grandfather  History   Social History  . Marital Status: Widowed    Spouse Name: Johnny Bridge    Number of Children: 3  . Years of Education: N/A   Occupational History  . [employer]     retired   Social History Main Topics  . Smoking status: Never Smoker   . Smokeless tobacco: Never Used  . Alcohol Use: 0 - .5 oz/week    0-1 drink(s) per week     Comment: in past  . Drug Use: Not on file  . Sexual Activity: Not on file   Other Topics Concern  . Not on file   Social History Narrative   Has living will   Daughter Cammy Brochure holds health care POA.   Has DNR and we have reviewed this.   Would not want tube feedings if cognitively unaware   Review of Systems Doesn't feel sleepy---meclizine did make him sleepy Hasn't used the cyclobenzaprine or tramadol in quite some time    Objective:   Physical Exam  Constitutional: He appears well-developed and well-nourished. No distress.  Eyes:  No nystagmus  Neck: Normal range of motion. Neck supple.   Cardiovascular: Normal rate, regular rhythm and normal heart sounds.  Exam reveals no gallop.   No murmur heard. Rate 68  Pulmonary/Chest: Effort normal and breath sounds normal. No respiratory distress. He has no wheezes. He has no rales.  Lymphadenopathy:    He has no cervical adenopathy.  Neurological:  No focal weakness-- ankle dorsiflexion and plantarflexion are somewhat weak bilaterally  Psychiatric: He has a normal mood and affect. His behavior is normal.          Assessment & Plan:

## 2012-12-22 NOTE — Assessment & Plan Note (Addendum)
This seems to be what he has had Had true vertigo but ENT confirms not vestibular Some weakness on right side---though exam shows at most subtle changes  Is back on his aspirin No other therapies would change (I don't think I would put him on a statin) and BP is fine Discussed getting MRI to confirm--we decided to defer this  Will get PT eval

## 2012-12-22 NOTE — Assessment & Plan Note (Addendum)
Heart rate down to 46 in ENT office yesterday--but this was measured by oximeter. This is very unreliable No cardiac symptoms and rate is normal now Will just observe  EKG benign---rate 59

## 2012-12-23 DIAGNOSIS — Z23 Encounter for immunization: Secondary | ICD-10-CM | POA: Diagnosis not present

## 2012-12-25 ENCOUNTER — Ambulatory Visit: Payer: Self-pay | Admitting: Internal Medicine

## 2012-12-29 DIAGNOSIS — M6281 Muscle weakness (generalized): Secondary | ICD-10-CM | POA: Diagnosis not present

## 2012-12-30 DIAGNOSIS — M6281 Muscle weakness (generalized): Secondary | ICD-10-CM | POA: Diagnosis not present

## 2012-12-31 DIAGNOSIS — M6281 Muscle weakness (generalized): Secondary | ICD-10-CM | POA: Diagnosis not present

## 2013-01-03 DIAGNOSIS — M6281 Muscle weakness (generalized): Secondary | ICD-10-CM | POA: Diagnosis not present

## 2013-01-04 DIAGNOSIS — I6529 Occlusion and stenosis of unspecified carotid artery: Secondary | ICD-10-CM | POA: Diagnosis not present

## 2013-01-05 DIAGNOSIS — M6281 Muscle weakness (generalized): Secondary | ICD-10-CM | POA: Diagnosis not present

## 2013-01-07 DIAGNOSIS — M6281 Muscle weakness (generalized): Secondary | ICD-10-CM | POA: Diagnosis not present

## 2013-01-09 DIAGNOSIS — M6281 Muscle weakness (generalized): Secondary | ICD-10-CM | POA: Diagnosis not present

## 2013-01-11 DIAGNOSIS — M6281 Muscle weakness (generalized): Secondary | ICD-10-CM | POA: Diagnosis not present

## 2013-01-12 ENCOUNTER — Encounter: Payer: Self-pay | Admitting: Podiatry

## 2013-01-12 ENCOUNTER — Ambulatory Visit (INDEPENDENT_AMBULATORY_CARE_PROVIDER_SITE_OTHER): Payer: Medicare Other | Admitting: Podiatry

## 2013-01-12 VITALS — BP 108/58 | HR 56 | Resp 16 | Ht 74.0 in | Wt 161.0 lb

## 2013-01-12 DIAGNOSIS — M79609 Pain in unspecified limb: Secondary | ICD-10-CM

## 2013-01-12 DIAGNOSIS — B351 Tinea unguium: Secondary | ICD-10-CM

## 2013-01-12 NOTE — Progress Notes (Signed)
Benjamin Villarreal presents today with a chief complaint of painful toenails one through 5 bilateral foot.  Objective: Pulses remain palpable bilateral. Nails are thick yellow dystrophic clinically mycotic and painful on palpation.  Assessment: Pain in limb secondary to onychomycosis 1 through 5 bilateral.  Plan: Debridement of nails in thickness and length as a covered service secondary to pain followup with him in 3 months.

## 2013-01-13 DIAGNOSIS — M6281 Muscle weakness (generalized): Secondary | ICD-10-CM | POA: Diagnosis not present

## 2013-02-15 DIAGNOSIS — H532 Diplopia: Secondary | ICD-10-CM | POA: Diagnosis not present

## 2013-03-03 DIAGNOSIS — D1800 Hemangioma unspecified site: Secondary | ICD-10-CM | POA: Diagnosis not present

## 2013-03-03 DIAGNOSIS — D485 Neoplasm of uncertain behavior of skin: Secondary | ICD-10-CM | POA: Diagnosis not present

## 2013-03-03 DIAGNOSIS — I8310 Varicose veins of unspecified lower extremity with inflammation: Secondary | ICD-10-CM | POA: Diagnosis not present

## 2013-03-03 DIAGNOSIS — D239 Other benign neoplasm of skin, unspecified: Secondary | ICD-10-CM | POA: Diagnosis not present

## 2013-03-03 DIAGNOSIS — C4441 Basal cell carcinoma of skin of scalp and neck: Secondary | ICD-10-CM | POA: Diagnosis not present

## 2013-03-03 DIAGNOSIS — L820 Inflamed seborrheic keratosis: Secondary | ICD-10-CM | POA: Diagnosis not present

## 2013-03-03 DIAGNOSIS — L259 Unspecified contact dermatitis, unspecified cause: Secondary | ICD-10-CM | POA: Diagnosis not present

## 2013-03-03 DIAGNOSIS — L821 Other seborrheic keratosis: Secondary | ICD-10-CM | POA: Diagnosis not present

## 2013-03-03 DIAGNOSIS — L578 Other skin changes due to chronic exposure to nonionizing radiation: Secondary | ICD-10-CM | POA: Diagnosis not present

## 2013-03-04 ENCOUNTER — Ambulatory Visit: Payer: Self-pay | Admitting: Internal Medicine

## 2013-03-04 DIAGNOSIS — Z7982 Long term (current) use of aspirin: Secondary | ICD-10-CM | POA: Diagnosis not present

## 2013-03-04 DIAGNOSIS — R972 Elevated prostate specific antigen [PSA]: Secondary | ICD-10-CM | POA: Diagnosis not present

## 2013-03-04 DIAGNOSIS — D649 Anemia, unspecified: Secondary | ICD-10-CM | POA: Diagnosis not present

## 2013-03-04 DIAGNOSIS — C61 Malignant neoplasm of prostate: Secondary | ICD-10-CM | POA: Diagnosis not present

## 2013-03-04 DIAGNOSIS — Z79899 Other long term (current) drug therapy: Secondary | ICD-10-CM | POA: Diagnosis not present

## 2013-03-04 DIAGNOSIS — D696 Thrombocytopenia, unspecified: Secondary | ICD-10-CM | POA: Diagnosis not present

## 2013-03-04 DIAGNOSIS — M810 Age-related osteoporosis without current pathological fracture: Secondary | ICD-10-CM | POA: Diagnosis not present

## 2013-03-04 LAB — HEPATIC FUNCTION PANEL A (ARMC)
ALBUMIN: 3.8 g/dL (ref 3.4–5.0)
ALK PHOS: 58 U/L
BILIRUBIN TOTAL: 0.2 mg/dL (ref 0.2–1.0)
Bilirubin, Direct: <0.05 mg/dL (ref 0.00–0.20)
SGOT(AST): 17 U/L (ref 15–37)
SGPT (ALT): 16 U/L (ref 12–78)
TOTAL PROTEIN: 7 g/dL (ref 6.4–8.2)

## 2013-03-04 LAB — CBC CANCER CENTER
Basophil #: 0 x10 3/mm (ref 0.0–0.1)
Basophil %: 0.6 %
EOS ABS: 0.4 x10 3/mm (ref 0.0–0.7)
Eosinophil %: 5.8 %
HCT: 35.8 % — AB (ref 40.0–52.0)
HGB: 11.5 g/dL — ABNORMAL LOW (ref 13.0–18.0)
LYMPHS ABS: 2 x10 3/mm (ref 1.0–3.6)
Lymphocyte %: 33.4 %
MCH: 27.3 pg (ref 26.0–34.0)
MCHC: 32.2 g/dL (ref 32.0–36.0)
MCV: 85 fL (ref 80–100)
MONO ABS: 0.6 x10 3/mm (ref 0.2–1.0)
Monocyte %: 10.1 %
Neutrophil #: 3.1 x10 3/mm (ref 1.4–6.5)
Neutrophil %: 50.1 %
PLATELETS: 155 x10 3/mm (ref 150–440)
RBC: 4.21 10*6/uL — ABNORMAL LOW (ref 4.40–5.90)
RDW: 14.8 % — ABNORMAL HIGH (ref 11.5–14.5)
WBC: 6.1 x10 3/mm (ref 3.8–10.6)

## 2013-03-04 LAB — CREATININE, SERUM
CREATININE: 1.22 mg/dL (ref 0.60–1.30)
EGFR (African American): >60
EGFR (Non-African Amer.): 52 — ABNORMAL LOW

## 2013-03-05 LAB — PSA: PSA: 2.2 ng/mL (ref 0.0–4.0)

## 2013-03-21 DIAGNOSIS — S8290XD Unspecified fracture of unspecified lower leg, subsequent encounter for closed fracture with routine healing: Secondary | ICD-10-CM | POA: Diagnosis not present

## 2013-03-27 ENCOUNTER — Ambulatory Visit: Payer: Self-pay | Admitting: Internal Medicine

## 2013-04-13 ENCOUNTER — Ambulatory Visit: Payer: Medicare Other | Admitting: Podiatry

## 2013-04-21 ENCOUNTER — Encounter: Payer: Self-pay | Admitting: Internal Medicine

## 2013-04-21 ENCOUNTER — Non-Acute Institutional Stay: Payer: Medicare Other | Admitting: Internal Medicine

## 2013-04-21 VITALS — BP 100/52 | HR 56 | Temp 98.1°F | Resp 18 | Wt 170.0 lb

## 2013-04-21 DIAGNOSIS — M1710 Unilateral primary osteoarthritis, unspecified knee: Secondary | ICD-10-CM | POA: Diagnosis not present

## 2013-04-21 DIAGNOSIS — J301 Allergic rhinitis due to pollen: Secondary | ICD-10-CM | POA: Diagnosis not present

## 2013-04-21 DIAGNOSIS — I872 Venous insufficiency (chronic) (peripheral): Secondary | ICD-10-CM

## 2013-04-21 DIAGNOSIS — K5909 Other constipation: Secondary | ICD-10-CM | POA: Diagnosis not present

## 2013-04-21 DIAGNOSIS — I6789 Other cerebrovascular disease: Secondary | ICD-10-CM | POA: Diagnosis not present

## 2013-04-21 DIAGNOSIS — IMO0002 Reserved for concepts with insufficient information to code with codable children: Secondary | ICD-10-CM

## 2013-04-21 DIAGNOSIS — G463 Brain stem stroke syndrome: Secondary | ICD-10-CM

## 2013-04-21 NOTE — Assessment & Plan Note (Signed)
Still with mild symptoms Some dry mouth--discussed strategies for this

## 2013-04-21 NOTE — Assessment & Plan Note (Signed)
Mild Support socks control

## 2013-04-21 NOTE — Progress Notes (Signed)
Subjective:    Patient ID: Benjamin Villarreal, male    DOB: 1920/07/21, 80 y.o.   MRN: [id number]  HPI Reviewed status with Leafy Ro RN No new concerns  Has some dry mouth and dry skin Discussed moisturizers and liquids Antihistamines control allergies but may be adding to the dryness Does get drainage at night that is in the back of his nose and throat  Vertigo has resolved Still has mild balance problems but no falls Works on balance at the exercise class Mostly has trouble losing balance forwards and backwards Uses rollator walker pretty much all the time  Bowels have been okay miralax 2.5 capfuls daily and 4 senna Usually goes once and occ twice a day on this regimen  Arthritis is mostly mild Doing well since the TKR Hasn't needed the tramadol  Still tends to get some leg swelling  Controlled with support socks  Current Outpatient Prescriptions on File Prior to Visit  Medication Sig Dispense Refill  . aspirin 81 MG EC tablet Take 81 mg by mouth every other day.       . cholecalciferol (VITAMIN D) 1000 UNITS tablet Take 1,000 Units by mouth daily.        . ciclopirox (LOPROX) 0.77 % cream Apply topically 2 (two) times daily.        . clindamycin (CLEOCIN) 150 MG capsule Take 150 mg by mouth. TAKES 1 HOUR PRIOR TO DENTAL WORK      . cyclobenzaprine (FLEXERIL) 5 MG tablet Take 1 tablet (5 mg total) by mouth at bedtime as needed.  90 tablet  3  . hydrocortisone 2.5 % cream Apply topically daily.  454 g  1  . loratadine (CLARITIN) 10 MG tablet Take 1 tablet (10 mg total) by mouth daily.  90 tablet  3  . mineral oil-hydrophilic petrolatum (AQUAPHOR) ointment Apply 1 application topically. APPLY 1 APPLICATION ON LEFT BIG TOE DAILY      . polyethylene glycol powder (MIRALAX) powder Take 17 g by mouth 2 (two) times daily.  527 g  3  . senna-docusate (SENNA S) 8.6-50 MG per tablet Take 4 tablets by mouth daily.       . traMADol (ULTRAM) 50 MG tablet Take 1 tablet (50 mg total) by mouth 3  (three) times daily as needed.  270 tablet  0  . triamcinolone cream (KENALOG) 0.1 % Apply topically 2 (two) times daily.      . vitamin B-12 (CYANOCOBALAMIN) 1000 MCG tablet Take 1 tablet (1,000 mcg total) by mouth daily.  90 tablet  3  . vitamin C (ASCORBIC ACID) 500 MG tablet Take 500 mg by mouth daily.       No current facility-administered medications on file prior to visit.    Allergies  Allergen Reactions  . Other     RAW ONIONS- SINUS REACTION    Past Medical History  Diagnosis Date  . Osteoporosis   . Arthritis   . Prostate cancer     surgery then lupron  . Colon polyps     adenomatous  . Other constipation     chronic  . Unspecified venous (peripheral) insufficiency   . Vitamin B12 deficiency     Past Surgical History  Procedure Laterality Date  . Cataract extraction  2000, 2003    both eyes  . Basal cell carcinoma excision  2004    Left side of face  . Tibia fracture surgery  1999  . Prostate surgery  1996    cancer  .  Volvulus reduction  12/10  . Joint replacement  2005    Left knee  . Joint replacement  12/13    Right knee    Family History  Problem Relation Age of Onset  . Diabetes Son   . Hypertension Paternal Grandfather     History   Social History  . Marital Status: Widowed    Spouse Name: [name]    Number of Children: 3  . Years of Education: N/A   Occupational History  . [employer]     retired   Social History Main Topics  . Smoking status: Never Smoker   . Smokeless tobacco: Never Used  . Alcohol Use: 0 - .5 oz/week    0-1 drink(s) per week     Comment: in past  . Drug Use: Not on file  . Sexual Activity: Not on file   Other Topics Concern  . Not on file   Social History Narrative   Has living will   Daughter [name] holds health care POA.   Has DNR and we have reviewed this.   Would not want tube feedings if cognitively unaware   Review of Systems Appetite is "too good" Has gained 5-7# Sleeps well  usually No mood issues No bladder problems---just notes some frequency     Objective:   Physical Exam  Constitutional: He is oriented to person, place, and time. He appears well-developed and well-nourished. No distress.  Neck: Normal range of motion. Neck supple. No thyromegaly present.  Cardiovascular: Normal rate, regular rhythm and normal heart sounds.  Exam reveals no gallop.   No murmur heard. Pulmonary/Chest: Effort normal and breath sounds normal. No respiratory distress. He has no wheezes. He has no rales.  Abdominal: Soft. There is no tenderness.  Musculoskeletal: He exhibits no edema and no tenderness.  Lymphadenopathy:    He has no cervical adenopathy.  Neurological: He is alert and oriented to person, place, and time.  Skin: No rash noted.  Psychiatric: He has a normal mood and affect. His behavior is normal.          Assessment & Plan:

## 2013-04-21 NOTE — Assessment & Plan Note (Signed)
Still some balance symptoms but mildly No falls No vertigo No action needed

## 2013-04-21 NOTE — Assessment & Plan Note (Signed)
Doing well on his regimen

## 2013-04-21 NOTE — Assessment & Plan Note (Signed)
Has done much better since the TKR Rarely needs pain meds now

## 2013-05-04 ENCOUNTER — Other Ambulatory Visit: Payer: Self-pay | Admitting: Internal Medicine

## 2013-05-09 DIAGNOSIS — Z471 Aftercare following joint replacement surgery: Secondary | ICD-10-CM | POA: Diagnosis not present

## 2013-05-18 DIAGNOSIS — C4442 Squamous cell carcinoma of skin of scalp and neck: Secondary | ICD-10-CM | POA: Diagnosis not present

## 2013-05-18 DIAGNOSIS — C4441 Basal cell carcinoma of skin of scalp and neck: Secondary | ICD-10-CM | POA: Diagnosis not present

## 2013-05-30 ENCOUNTER — Ambulatory Visit: Payer: Medicare Other | Admitting: Podiatry

## 2013-06-01 ENCOUNTER — Ambulatory Visit: Payer: Self-pay | Admitting: Internal Medicine

## 2013-06-01 DIAGNOSIS — C61 Malignant neoplasm of prostate: Secondary | ICD-10-CM | POA: Diagnosis not present

## 2013-06-02 DIAGNOSIS — C61 Malignant neoplasm of prostate: Secondary | ICD-10-CM | POA: Diagnosis not present

## 2013-06-02 LAB — CBC CANCER CENTER
BASOS PCT: 2.5 %
Basophil #: 0.1 x10 3/mm (ref 0.0–0.1)
EOS PCT: 4.7 %
Eosinophil #: 0.3 x10 3/mm (ref 0.0–0.7)
HCT: 35.7 % — ABNORMAL LOW (ref 40.0–52.0)
HGB: 11.6 g/dL — ABNORMAL LOW (ref 13.0–18.0)
LYMPHS PCT: 33.5 %
Lymphocyte #: 1.9 x10 3/mm (ref 1.0–3.6)
MCH: 27.7 pg (ref 26.0–34.0)
MCHC: 32.4 g/dL (ref 32.0–36.0)
MCV: 85 fL (ref 80–100)
MONOS PCT: 8.6 %
Monocyte #: 0.5 x10 3/mm (ref 0.2–1.0)
NEUTROS PCT: 50.7 %
Neutrophil #: 2.8 x10 3/mm (ref 1.4–6.5)
Platelet: 139 x10 3/mm — ABNORMAL LOW (ref 150–440)
RBC: 4.19 10*6/uL — ABNORMAL LOW (ref 4.40–5.90)
RDW: 14.8 % — AB (ref 11.5–14.5)
WBC: 5.6 x10 3/mm (ref 3.8–10.6)

## 2013-06-02 LAB — CREATININE, SERUM
Creatinine: 1.17 mg/dL (ref 0.60–1.30)
EGFR (African American): >60
EGFR (Non-African Amer.): 55 — ABNORMAL LOW

## 2013-06-03 LAB — PSA: PSA: 2.6 ng/mL (ref 0.0–4.0)

## 2013-06-13 ENCOUNTER — Ambulatory Visit (INDEPENDENT_AMBULATORY_CARE_PROVIDER_SITE_OTHER): Payer: Medicare Other | Admitting: Podiatry

## 2013-06-13 VITALS — Resp 16 | Ht 74.0 in | Wt 168.0 lb

## 2013-06-13 DIAGNOSIS — M79609 Pain in unspecified limb: Secondary | ICD-10-CM

## 2013-06-13 DIAGNOSIS — B351 Tinea unguium: Secondary | ICD-10-CM | POA: Diagnosis not present

## 2013-06-13 NOTE — Progress Notes (Signed)
He presents today with a chief complaint of painful elongated toenails bilateral.  Objective: Vital signs are stable he is alert and oriented x3. Nails are thick yellow dystrophic and mycotic painful palpation as well as debridement.  Assessment: Pain in limb secondary to onychomycosis.  Plan: Debridement of nails 1 through 5 bilateral.

## 2013-06-24 ENCOUNTER — Ambulatory Visit: Payer: Self-pay | Admitting: Internal Medicine

## 2013-07-12 ENCOUNTER — Other Ambulatory Visit: Payer: Self-pay | Admitting: *Deleted

## 2013-07-12 DIAGNOSIS — M6281 Muscle weakness (generalized): Secondary | ICD-10-CM | POA: Diagnosis not present

## 2013-07-12 MED ORDER — VITAMIN B-12 1000 MCG PO TABS: 1000.0000 ug | ORAL_TABLET | Freq: Every day | ORAL | Status: DC

## 2013-07-15 DIAGNOSIS — M6281 Muscle weakness (generalized): Secondary | ICD-10-CM | POA: Diagnosis not present

## 2013-07-19 DIAGNOSIS — M6281 Muscle weakness (generalized): Secondary | ICD-10-CM | POA: Diagnosis not present

## 2013-08-02 DIAGNOSIS — M6281 Muscle weakness (generalized): Secondary | ICD-10-CM | POA: Diagnosis not present

## 2013-08-03 DIAGNOSIS — M6281 Muscle weakness (generalized): Secondary | ICD-10-CM | POA: Diagnosis not present

## 2013-08-05 DIAGNOSIS — M6281 Muscle weakness (generalized): Secondary | ICD-10-CM | POA: Diagnosis not present

## 2013-08-08 DIAGNOSIS — M6281 Muscle weakness (generalized): Secondary | ICD-10-CM | POA: Diagnosis not present

## 2013-08-10 DIAGNOSIS — M6281 Muscle weakness (generalized): Secondary | ICD-10-CM | POA: Diagnosis not present

## 2013-08-16 DIAGNOSIS — H532 Diplopia: Secondary | ICD-10-CM | POA: Diagnosis not present

## 2013-08-18 ENCOUNTER — Encounter: Payer: Medicare Other | Admitting: Internal Medicine

## 2013-08-25 ENCOUNTER — Encounter: Payer: Self-pay | Admitting: Internal Medicine

## 2013-08-25 ENCOUNTER — Non-Acute Institutional Stay: Payer: Medicare Other | Admitting: Internal Medicine

## 2013-08-25 VITALS — BP 120/58 | HR 62 | Temp 98.0°F | Resp 18 | Wt 172.0 lb

## 2013-08-25 DIAGNOSIS — I872 Venous insufficiency (chronic) (peripheral): Secondary | ICD-10-CM

## 2013-08-25 DIAGNOSIS — J301 Allergic rhinitis due to pollen: Secondary | ICD-10-CM

## 2013-08-25 DIAGNOSIS — I6789 Other cerebrovascular disease: Secondary | ICD-10-CM | POA: Diagnosis not present

## 2013-08-25 DIAGNOSIS — K5909 Other constipation: Secondary | ICD-10-CM

## 2013-08-25 DIAGNOSIS — M159 Polyosteoarthritis, unspecified: Secondary | ICD-10-CM | POA: Diagnosis not present

## 2013-08-25 DIAGNOSIS — M150 Primary generalized (osteo)arthritis: Secondary | ICD-10-CM

## 2013-08-25 DIAGNOSIS — G463 Brain stem stroke syndrome: Secondary | ICD-10-CM

## 2013-08-25 NOTE — Assessment & Plan Note (Signed)
Ongoing symptoms  Will increase the loratadine to 20

## 2013-08-25 NOTE — Assessment & Plan Note (Signed)
Usually mild symptoms

## 2013-08-25 NOTE — Assessment & Plan Note (Signed)
Symptoms resolved He had stopped the asa due to nosebleed---I asked him to restart at 81 every other day

## 2013-08-25 NOTE — Assessment & Plan Note (Signed)
Doing fine on his regimen

## 2013-08-25 NOTE — Progress Notes (Signed)
Subjective:    Patient ID: Benjamin Villarreal, male    DOB: [birth year], 80 y.o.   MRN: [id number]  HPI Reviewed status with [name]  Doing well Continues to run church service intermittently Does exercise group and daily AM back exercises Tries to walk regularly also  Having some sinus problems Lots of PND---yellow and green at times Does take the loratadine at bedtime Will increase the dose  Occasional pain in left chest Goes away with repositioning Not exertional No nausea, sweats or dyspnea  Wears support sock on right calf This controls swelling No recent ulcers on calves---still with discoloration  Has not had recurrence of vertigo Walking well with rollator No dizziness  Arthritis is okay May have finger pain after playing piano Some limitation in dorsiflexion of feet---trouble with piano pedals Left foot will drag a little at times  Bowels okay with miralax and senna S regimen  Current Outpatient Prescriptions on File Prior to Visit  Medication Sig Dispense Refill  . aspirin 81 MG EC tablet Take 81 mg by mouth every other day.       . cholecalciferol (VITAMIN D) 1000 UNITS tablet Take 1,000 Units by mouth daily.        . ciclopirox (LOPROX) 0.77 % cream Apply topically 2 (two) times daily.        . clindamycin (CLEOCIN) 150 MG capsule Take 150 mg by mouth. TAKES 1 HOUR PRIOR TO DENTAL WORK      . cyclobenzaprine (FLEXERIL) 5 MG tablet Take 1 tablet (5 mg total) by mouth at bedtime as needed.  90 tablet  3  . glucosamine-chondroitin 500-400 MG tablet Take 1 tablet by mouth daily.      . hydrocortisone 2.5 % cream Apply topically daily.  454 g  1  . mineral oil-hydrophilic petrolatum (AQUAPHOR) ointment Apply 1 application topically. APPLY 1 APPLICATION ON LEFT BIG TOE DAILY      . polyethylene glycol powder (MIRALAX) powder Take 17 g by mouth 2 (two) times daily.  527 g  3  . traMADol (ULTRAM) 50 MG tablet Take 1 tablet (50 mg total) by mouth 3 (three) times daily as  needed.  270 tablet  0  . triamcinolone cream (KENALOG) 0.1 % Apply topically 2 (two) times daily.      . vitamin B-12 (CYANOCOBALAMIN) 1000 MCG tablet Take 1 tablet (1,000 mcg total) by mouth daily.  90 tablet  3  . vitamin C (ASCORBIC ACID) 500 MG tablet Take 500 mg by mouth daily.       No current facility-administered medications on file prior to visit.    Allergies  Allergen Reactions  . Other     RAW ONIONS- SINUS REACTION    Past Medical History  Diagnosis Date  . Osteoporosis   . Arthritis   . Prostate cancer     surgery then lupron  . Colon polyps     adenomatous  . Other constipation     chronic  . Unspecified venous (peripheral) insufficiency   . Vitamin B12 deficiency     Past Surgical History  Procedure Laterality Date  . Cataract extraction  2000, 2003    both eyes  . Basal cell carcinoma excision  2004    Left side of face  . Tibia fracture surgery  1999  . Prostate surgery  1996    cancer  . Volvulus reduction  12/10  . Joint replacement  2005    Left knee  . Joint replacement  12/13  Right knee    Family History  Problem Relation Age of Onset  . Diabetes Son   . Hypertension Paternal Grandfather     History   Social History  . Marital Status: Widowed    Spouse Name: Benjamin Villarreal    Number of Children: 3  . Years of Education: N/A   Occupational History  . [employer]     retired   Social History Main Topics  . Smoking status: Never Smoker   . Smokeless tobacco: Never Used  . Alcohol Use: 0.0 - 0.5 oz/week    0-1 drink(s) per week     Comment: in past  . Drug Use: Not on file  . Sexual Activity: Not on file   Other Topics Concern  . Not on file   Social History Narrative   Has living will   Daughter Benjamin Villarreal holds health care POA.   Has DNR and we have reviewed this.   Would not want tube feedings if cognitively unaware   Review of Systems Sleeps well Appetite is fine Weight is stable     Objective:    Physical Exam  Constitutional: He appears well-developed and well-nourished. No distress.  Neck: Normal range of motion. Neck supple. No thyromegaly present.  Cardiovascular: Normal rate and regular rhythm.  Exam reveals no gallop.   Gr 2/6 systolic murmur loudest along left sternal border  Pulmonary/Chest: Effort normal and breath sounds normal. No respiratory distress. He has no wheezes. He has no rales.  Abdominal: Soft. There is no tenderness.  Musculoskeletal: He exhibits no edema and no tenderness.  Lymphadenopathy:    He has no cervical adenopathy.  Skin: No rash noted. No erythema.  Mild stasis changes L>R calf          Assessment & Plan:

## 2013-08-25 NOTE — Assessment & Plan Note (Signed)
Uses support hose and moisturizing cream No ulcers

## 2013-09-02 ENCOUNTER — Ambulatory Visit: Payer: Self-pay | Admitting: Internal Medicine

## 2013-09-02 DIAGNOSIS — M1990 Unspecified osteoarthritis, unspecified site: Secondary | ICD-10-CM | POA: Diagnosis not present

## 2013-09-02 DIAGNOSIS — Z79818 Long term (current) use of other agents affecting estrogen receptors and estrogen levels: Secondary | ICD-10-CM | POA: Diagnosis not present

## 2013-09-02 DIAGNOSIS — Z833 Family history of diabetes mellitus: Secondary | ICD-10-CM | POA: Diagnosis not present

## 2013-09-02 DIAGNOSIS — Z806 Family history of leukemia: Secondary | ICD-10-CM | POA: Diagnosis not present

## 2013-09-02 DIAGNOSIS — Z8781 Personal history of (healed) traumatic fracture: Secondary | ICD-10-CM | POA: Diagnosis not present

## 2013-09-02 DIAGNOSIS — D649 Anemia, unspecified: Secondary | ICD-10-CM | POA: Diagnosis not present

## 2013-09-02 DIAGNOSIS — M810 Age-related osteoporosis without current pathological fracture: Secondary | ICD-10-CM | POA: Diagnosis not present

## 2013-09-02 DIAGNOSIS — Z85820 Personal history of malignant melanoma of skin: Secondary | ICD-10-CM | POA: Diagnosis not present

## 2013-09-02 DIAGNOSIS — D696 Thrombocytopenia, unspecified: Secondary | ICD-10-CM | POA: Diagnosis not present

## 2013-09-02 DIAGNOSIS — Z79899 Other long term (current) drug therapy: Secondary | ICD-10-CM | POA: Diagnosis not present

## 2013-09-02 DIAGNOSIS — C61 Malignant neoplasm of prostate: Secondary | ICD-10-CM | POA: Diagnosis not present

## 2013-09-02 DIAGNOSIS — Z7982 Long term (current) use of aspirin: Secondary | ICD-10-CM | POA: Diagnosis not present

## 2013-09-02 DIAGNOSIS — Z96659 Presence of unspecified artificial knee joint: Secondary | ICD-10-CM | POA: Diagnosis not present

## 2013-09-02 DIAGNOSIS — M129 Arthropathy, unspecified: Secondary | ICD-10-CM | POA: Diagnosis not present

## 2013-09-02 DIAGNOSIS — Z9079 Acquired absence of other genital organ(s): Secondary | ICD-10-CM | POA: Diagnosis not present

## 2013-09-02 DIAGNOSIS — K5900 Constipation, unspecified: Secondary | ICD-10-CM | POA: Diagnosis not present

## 2013-09-02 LAB — HEPATIC FUNCTION PANEL A (ARMC)
ALT: 15 U/L (ref 12–78)
Albumin: 3.5 g/dL (ref 3.4–5.0)
Alkaline Phosphatase: 79 U/L
Bilirubin, Direct: 0.1 mg/dL (ref 0.00–0.20)
Bilirubin,Total: 0.3 mg/dL (ref 0.2–1.0)
SGOT(AST): 16 U/L (ref 15–37)
Total Protein: 7.3 g/dL (ref 6.4–8.2)

## 2013-09-02 LAB — CBC CANCER CENTER
Basophil #: 0.1 x10 3/mm (ref 0.0–0.1)
Basophil %: 2.1 %
EOS ABS: 0.7 x10 3/mm (ref 0.0–0.7)
EOS PCT: 10.7 %
HCT: 33.9 % — AB (ref 40.0–52.0)
HGB: 11.3 g/dL — ABNORMAL LOW (ref 13.0–18.0)
LYMPHS PCT: 27.3 %
Lymphocyte #: 1.7 x10 3/mm (ref 1.0–3.6)
MCH: 27.8 pg (ref 26.0–34.0)
MCHC: 33.2 g/dL (ref 32.0–36.0)
MCV: 84 fL (ref 80–100)
MONO ABS: 0.6 x10 3/mm (ref 0.2–1.0)
Monocyte %: 9.8 %
Neutrophil #: 3.1 x10 3/mm (ref 1.4–6.5)
Neutrophil %: 50.1 %
Platelet: 159 x10 3/mm (ref 150–440)
RBC: 4.04 10*6/uL — ABNORMAL LOW (ref 4.40–5.90)
RDW: 15 % — AB (ref 11.5–14.5)
WBC: 6.3 x10 3/mm (ref 3.8–10.6)

## 2013-09-02 LAB — CREATININE, SERUM
CREATININE: 1.27 mg/dL (ref 0.60–1.30)
EGFR (Non-African Amer.): 49 — ABNORMAL LOW
GFR CALC AF AMER: 57 — AB

## 2013-09-05 LAB — PSA: PSA: 3.4 ng/mL (ref 0.0–4.0)

## 2013-09-24 ENCOUNTER — Ambulatory Visit: Payer: Self-pay | Admitting: Internal Medicine

## 2013-10-03 ENCOUNTER — Ambulatory Visit (INDEPENDENT_AMBULATORY_CARE_PROVIDER_SITE_OTHER): Payer: Medicare Other | Admitting: Podiatry

## 2013-10-03 DIAGNOSIS — B351 Tinea unguium: Secondary | ICD-10-CM | POA: Diagnosis not present

## 2013-10-03 DIAGNOSIS — M79609 Pain in unspecified limb: Secondary | ICD-10-CM | POA: Diagnosis not present

## 2013-10-03 DIAGNOSIS — M79676 Pain in unspecified toe(s): Secondary | ICD-10-CM

## 2013-10-03 NOTE — Progress Notes (Signed)
He presents today with a chief complaint of painful elongated toenails.  Objective: Nails are thick yellow dystrophic onychomycotic and painful palpation.  Assessment: Pain in limb secondary to onychomycosis 1 through 5 bilateral.  Plan: Debridement of nails 1 through 5 bilateral. 

## 2013-10-10 DIAGNOSIS — R040 Epistaxis: Secondary | ICD-10-CM | POA: Diagnosis not present

## 2013-10-13 DIAGNOSIS — D1800 Hemangioma unspecified site: Secondary | ICD-10-CM | POA: Diagnosis not present

## 2013-10-13 DIAGNOSIS — L578 Other skin changes due to chronic exposure to nonionizing radiation: Secondary | ICD-10-CM | POA: Diagnosis not present

## 2013-10-13 DIAGNOSIS — L821 Other seborrheic keratosis: Secondary | ICD-10-CM | POA: Diagnosis not present

## 2013-10-13 DIAGNOSIS — Z85828 Personal history of other malignant neoplasm of skin: Secondary | ICD-10-CM | POA: Diagnosis not present

## 2013-10-13 DIAGNOSIS — I8390 Asymptomatic varicose veins of unspecified lower extremity: Secondary | ICD-10-CM | POA: Diagnosis not present

## 2013-10-13 DIAGNOSIS — D179 Benign lipomatous neoplasm, unspecified: Secondary | ICD-10-CM | POA: Diagnosis not present

## 2013-10-13 DIAGNOSIS — Z1283 Encounter for screening for malignant neoplasm of skin: Secondary | ICD-10-CM | POA: Diagnosis not present

## 2013-11-21 DIAGNOSIS — H532 Diplopia: Secondary | ICD-10-CM | POA: Diagnosis not present

## 2013-12-05 ENCOUNTER — Ambulatory Visit: Payer: Self-pay | Admitting: Internal Medicine

## 2013-12-05 DIAGNOSIS — K5900 Constipation, unspecified: Secondary | ICD-10-CM | POA: Diagnosis not present

## 2013-12-05 DIAGNOSIS — Z96652 Presence of left artificial knee joint: Secondary | ICD-10-CM | POA: Diagnosis not present

## 2013-12-05 DIAGNOSIS — Z85828 Personal history of other malignant neoplasm of skin: Secondary | ICD-10-CM | POA: Diagnosis not present

## 2013-12-05 DIAGNOSIS — D696 Thrombocytopenia, unspecified: Secondary | ICD-10-CM | POA: Diagnosis not present

## 2013-12-05 DIAGNOSIS — M1990 Unspecified osteoarthritis, unspecified site: Secondary | ICD-10-CM | POA: Diagnosis not present

## 2013-12-05 DIAGNOSIS — M818 Other osteoporosis without current pathological fracture: Secondary | ICD-10-CM | POA: Diagnosis not present

## 2013-12-05 DIAGNOSIS — C61 Malignant neoplasm of prostate: Secondary | ICD-10-CM | POA: Diagnosis not present

## 2013-12-05 DIAGNOSIS — R972 Elevated prostate specific antigen [PSA]: Secondary | ICD-10-CM | POA: Diagnosis not present

## 2013-12-05 DIAGNOSIS — D649 Anemia, unspecified: Secondary | ICD-10-CM | POA: Diagnosis not present

## 2013-12-05 DIAGNOSIS — Z79899 Other long term (current) drug therapy: Secondary | ICD-10-CM | POA: Diagnosis not present

## 2013-12-05 LAB — CBC CANCER CENTER
BASOS ABS: 0.1 x10 3/mm (ref 0.0–0.1)
Basophil %: 2.5 %
EOS ABS: 0.3 x10 3/mm (ref 0.0–0.7)
Eosinophil %: 4.8 %
HCT: 35 % — AB (ref 40.0–52.0)
HGB: 11.2 g/dL — ABNORMAL LOW (ref 13.0–18.0)
Lymphocyte #: 1.9 x10 3/mm (ref 1.0–3.6)
Lymphocyte %: 31.3 %
MCH: 27 pg (ref 26.0–34.0)
MCHC: 31.9 g/dL — ABNORMAL LOW (ref 32.0–36.0)
MCV: 85 fL (ref 80–100)
MONO ABS: 0.5 x10 3/mm (ref 0.2–1.0)
Monocyte %: 8.4 %
Neutrophil #: 3.2 x10 3/mm (ref 1.4–6.5)
Neutrophil %: 53 %
Platelet: 145 x10 3/mm — ABNORMAL LOW (ref 150–440)
RBC: 4.14 10*6/uL — AB (ref 4.40–5.90)
RDW: 15.2 % — ABNORMAL HIGH (ref 11.5–14.5)
WBC: 6 x10 3/mm (ref 3.8–10.6)

## 2013-12-05 LAB — CREATININE, SERUM
Creatinine: 1.14 mg/dL (ref 0.60–1.30)
EGFR (African American): >60

## 2013-12-06 LAB — PSA: PSA: 5.4 ng/mL — ABNORMAL HIGH (ref 0.0–4.0)

## 2013-12-07 DIAGNOSIS — H532 Diplopia: Secondary | ICD-10-CM | POA: Diagnosis not present

## 2013-12-13 DIAGNOSIS — M818 Other osteoporosis without current pathological fracture: Secondary | ICD-10-CM | POA: Diagnosis not present

## 2013-12-13 DIAGNOSIS — D696 Thrombocytopenia, unspecified: Secondary | ICD-10-CM | POA: Diagnosis not present

## 2013-12-13 DIAGNOSIS — C61 Malignant neoplasm of prostate: Secondary | ICD-10-CM | POA: Diagnosis not present

## 2013-12-13 DIAGNOSIS — D649 Anemia, unspecified: Secondary | ICD-10-CM | POA: Diagnosis not present

## 2013-12-15 ENCOUNTER — Non-Acute Institutional Stay: Payer: Medicare Other | Admitting: Internal Medicine

## 2013-12-15 ENCOUNTER — Encounter: Payer: Self-pay | Admitting: Internal Medicine

## 2013-12-15 VITALS — BP 120/60 | HR 80 | Temp 98.3°F | Resp 18 | Wt 169.0 lb

## 2013-12-15 DIAGNOSIS — M810 Age-related osteoporosis without current pathological fracture: Secondary | ICD-10-CM

## 2013-12-15 DIAGNOSIS — G463 Brain stem stroke syndrome: Secondary | ICD-10-CM

## 2013-12-15 DIAGNOSIS — C61 Malignant neoplasm of prostate: Secondary | ICD-10-CM

## 2013-12-15 DIAGNOSIS — M150 Primary generalized (osteo)arthritis: Secondary | ICD-10-CM | POA: Diagnosis not present

## 2013-12-15 DIAGNOSIS — Z23 Encounter for immunization: Secondary | ICD-10-CM | POA: Diagnosis not present

## 2013-12-15 DIAGNOSIS — K5909 Other constipation: Secondary | ICD-10-CM | POA: Diagnosis not present

## 2013-12-15 DIAGNOSIS — M159 Polyosteoarthritis, unspecified: Secondary | ICD-10-CM

## 2013-12-15 NOTE — Assessment & Plan Note (Signed)
No recent symptoms Urged him to restart the aspirin every other day

## 2013-12-15 NOTE — Assessment & Plan Note (Signed)
PSA rising Still uncertain risk/benefit ratio with restarting lupron Getting DEXA to assess bone health before further decisions

## 2013-12-15 NOTE — Assessment & Plan Note (Signed)
Doing well with current regimen. 

## 2013-12-15 NOTE — Progress Notes (Signed)
Subjective:    Patient ID: Benjamin Villarreal, male    DOB: December 18, 1920, 80 y.o.   MRN: [id number]  HPI Doing well No new concerns from [facility]  Recent visit with Dr Ma Hillock PSA has risen somewhat to 5.4 May be considering more treatment--though he has no symptoms Getting DEXA and then considering lupron again  Still does occasional church services Stays involved here--helping a resident here with dementia who is having problems  Arthritis is not bad Tries to walk daily Exercises for back in bed in AM Goes to the exercise class here M/W/F as well Hasn't needed the tramadol No flexeril recently either  Continues on his bowel regimen This keeps him regular No GI problems  No recent vertigo No balance problems Stopped the aspirin due to some nosebleeds Advised him to restart  Current Outpatient Prescriptions on File Prior to Visit  Medication Sig Dispense Refill  . aspirin 81 MG EC tablet Take 81 mg by mouth every other day.       . Calcium Carbonate-Vitamin D (CALCIUM-D) 600-400 MG-UNIT TABS Take 1 tablet by mouth daily.      . cholecalciferol (VITAMIN D) 1000 UNITS tablet Take 1,000 Units by mouth daily.        . ciclopirox (LOPROX) 0.77 % cream Apply topically 2 (two) times daily.        . clindamycin (CLEOCIN) 150 MG capsule Take 150 mg by mouth. TAKES 1 HOUR PRIOR TO DENTAL WORK      . cyclobenzaprine (FLEXERIL) 5 MG tablet Take 1 tablet (5 mg total) by mouth at bedtime as needed.  90 tablet  3  . glucosamine-chondroitin 500-400 MG tablet Take 1 tablet by mouth daily.      Marland Kitchen loratadine (CLARITIN) 10 MG tablet Take 20 mg by mouth at bedtime.      . mineral oil-hydrophilic petrolatum (AQUAPHOR) ointment Apply 1 application topically. APPLY 1 APPLICATION ON LEFT BIG TOE DAILY      . polyethylene glycol powder (MIRALAX) powder Take 17 g by mouth 2 (two) times daily.  527 g  3  . sennosides-docusate sodium (SENOKOT-S) 8.6-50 MG tablet Take 4 tablets by mouth daily.      . traMADol  (ULTRAM) 50 MG tablet Take 1 tablet (50 mg total) by mouth 3 (three) times daily as needed.  270 tablet  0  . triamcinolone cream (KENALOG) 0.1 % Apply topically 2 (two) times daily.      . vitamin B-12 (CYANOCOBALAMIN) 1000 MCG tablet Take 1 tablet (1,000 mcg total) by mouth daily.  90 tablet  3  . vitamin C (ASCORBIC ACID) 500 MG tablet Take 500 mg by mouth daily.       No current facility-administered medications on file prior to visit.    Allergies  Allergen Reactions  . Other     RAW ONIONS- SINUS REACTION    Past Medical History  Diagnosis Date  . Osteoporosis   . Arthritis   . Prostate cancer     surgery then lupron  . Colon polyps     adenomatous  . Other constipation     chronic  . Unspecified venous (peripheral) insufficiency   . Vitamin B12 deficiency     Past Surgical History  Procedure Laterality Date  . Cataract extraction  2000, 2003    both eyes  . Basal cell carcinoma excision  2004    Left side of face  . Tibia fracture surgery  1999  . Prostate surgery  1996  cancer  . Volvulus reduction  12/10  . Joint replacement  2005    Left knee  . Joint replacement  12/13    Right knee    Family History  Problem Relation Age of Onset  . Diabetes Son   . Hypertension Paternal Grandfather     History   Social History  . Marital Status: Married    Spouse Name: [name]    Number of Children: 3  . Years of Education: N/A   Occupational History  . [employer]     retired   Social History Main Topics  . Smoking status: Never Smoker   . Smokeless tobacco: Never Used  . Alcohol Use: 0.0 - 0.5 oz/week    0-1 drink(s) per week     Comment: in past  . Drug Use: Not on file  . Sexual Activity: Not on file   Other Topics Concern  . Not on file   Social History Narrative   Has living will   Daughter [name] holds health care POA.   Has DNR and we have reviewed this.   Would not want tube feedings if cognitively unaware   Review of  Systems Scheduled for root canal--has clindamycin for before the procedure Appetite is okay Weight is stable Generally sleeps okay Mood has been good. Lots of nasal drainage this allergy season--uses saline and still on the loratadine    Objective:   Physical Exam  Constitutional: He appears well-developed and well-nourished. No distress.  Neck: Normal range of motion. Neck supple. No thyromegaly present.  Cardiovascular: Normal rate, regular rhythm and normal heart sounds.  Exam reveals no gallop.   No murmur heard. Pulmonary/Chest: Effort normal and breath sounds normal. No respiratory distress. He has no wheezes. He has no rales.  Abdominal: Soft. There is no tenderness.  Musculoskeletal: He exhibits no edema and no tenderness.  Lymphadenopathy:    He has no cervical adenopathy.  Psychiatric: He has a normal mood and affect. His behavior is normal.          Assessment & Plan:

## 2013-12-15 NOTE — Assessment & Plan Note (Signed)
Getting DEXA next week On calcium and vitamin D

## 2013-12-15 NOTE — Assessment & Plan Note (Signed)
Doing better Hasn't needed the tramadol

## 2013-12-25 ENCOUNTER — Ambulatory Visit: Payer: Self-pay | Admitting: Internal Medicine

## 2014-01-04 ENCOUNTER — Ambulatory Visit (INDEPENDENT_AMBULATORY_CARE_PROVIDER_SITE_OTHER): Payer: Medicare Other | Admitting: Podiatry

## 2014-01-04 DIAGNOSIS — M79676 Pain in unspecified toe(s): Secondary | ICD-10-CM | POA: Diagnosis not present

## 2014-01-04 DIAGNOSIS — B351 Tinea unguium: Secondary | ICD-10-CM

## 2014-01-04 NOTE — Progress Notes (Signed)
He presents today with a chief complaint of painful elongated toenails.  Objective: Nails are thick yellow dystrophic onychomycotic and painful palpation.  Assessment: Pain in limb secondary to onychomycosis 1 through 5 bilateral.  Plan: Debridement of nails 1 through 5 bilateral. 

## 2014-02-02 ENCOUNTER — Encounter: Payer: Self-pay | Admitting: Internal Medicine

## 2014-02-02 ENCOUNTER — Ambulatory Visit: Payer: Medicare Other | Admitting: Internal Medicine

## 2014-02-02 ENCOUNTER — Ambulatory Visit (INDEPENDENT_AMBULATORY_CARE_PROVIDER_SITE_OTHER): Payer: Medicare Other | Admitting: Internal Medicine

## 2014-02-02 VITALS — BP 96/54 | HR 62 | Temp 97.8°F | Wt 173.0 lb

## 2014-02-02 DIAGNOSIS — R000 Tachycardia, unspecified: Secondary | ICD-10-CM

## 2014-02-02 NOTE — Assessment & Plan Note (Signed)
Associated with dyspnea Lasted a few hours Documented in 120's--so not fast enough for SVT Could be sinus tach---- or paroxysm of atrial fib Will try to get EKG as soon as possible there if this recurs Asked him to restart the aspirin  No pneumonia symptoms and lungs clear Only pulmonary process that comes to mind is PE. No recent travel, immobility, etc and leg exam is normal I will check labs---CBC to rule out anemia Will check d-dimer, then CT scan if positive (vs leg ultrasounds)  Looks fine now so no action otherwise

## 2014-02-02 NOTE — Progress Notes (Signed)
Subjective:    Patient ID: Alba Kriesel, male    DOB: 07-27-1920, 80 y.o.   MRN: [id number]  HPI Here for feeling SOB RN at [facility] noted BP about what it is here but HR 126 or so Had some labored breathing Rare chest pain---but not recently  No clear palpitations---just uncomfortable with breathing Had awoken somewhat chilly Had sense "of not getting enough oxygen" Mickel Baas CNA is here---went in 7:30AM to put his hose on and he was not normal Oxygen levels were okay  Breathing did feel better by about noon Has eaten well Has tried to drink more water today---thinks he drinks enough  Current Outpatient Prescriptions on File Prior to Visit  Medication Sig Dispense Refill  . aspirin 81 MG EC tablet Take 81 mg by mouth every other day.     . Calcium Carbonate-Vitamin D (CALCIUM-D) 600-400 MG-UNIT TABS Take 1 tablet by mouth daily.    . cholecalciferol (VITAMIN D) 1000 UNITS tablet Take 1,000 Units by mouth daily.      . ciclopirox (LOPROX) 0.77 % cream Apply topically 2 (two) times daily.      . clindamycin (CLEOCIN) 150 MG capsule Take 150 mg by mouth. TAKES 1 HOUR PRIOR TO DENTAL WORK    . cyclobenzaprine (FLEXERIL) 5 MG tablet Take 1 tablet (5 mg total) by mouth at bedtime as needed. 90 tablet 3  . glucosamine-chondroitin 500-400 MG tablet Take 3 tablets by mouth daily.     Marland Kitchen loratadine (CLARITIN) 10 MG tablet Take 20 mg by mouth at bedtime.    . mineral oil-hydrophilic petrolatum (AQUAPHOR) ointment Apply 1 application topically. APPLY 1 APPLICATION ON LEFT BIG TOE DAILY    . polyethylene glycol powder (MIRALAX) powder Take 17 g by mouth 2 (two) times daily. (Patient taking differently: Take 17 g by mouth 3 (three) times daily. ) 527 g 3  . sennosides-docusate sodium (SENOKOT-S) 8.6-50 MG tablet Take 4 tablets by mouth daily.    . traMADol (ULTRAM) 50 MG tablet Take 1 tablet (50 mg total) by mouth 3 (three) times daily as needed. 270 tablet 0  . triamcinolone cream (KENALOG)  0.1 % Apply topically 2 (two) times daily.    . vitamin B-12 (CYANOCOBALAMIN) 1000 MCG tablet Take 1 tablet (1,000 mcg total) by mouth daily. 90 tablet 3  . vitamin C (ASCORBIC ACID) 500 MG tablet Take 500 mg by mouth daily.     No current facility-administered medications on file prior to visit.    Allergies  Allergen Reactions  . Other     RAW ONIONS- SINUS REACTION    Past Medical History  Diagnosis Date  . Osteoporosis   . Arthritis   . Prostate cancer     surgery then lupron  . Colon polyps     adenomatous  . Other constipation     chronic  . Unspecified venous (peripheral) insufficiency   . Vitamin B12 deficiency     Past Surgical History  Procedure Laterality Date  . Cataract extraction  2000, 2003    both eyes  . Basal cell carcinoma excision  2004    Left side of face  . Tibia fracture surgery  1999  . Prostate surgery  1996    cancer  . Volvulus reduction  12/10  . Joint replacement  2005    Left knee  . Joint replacement  12/13    Right knee    Family History  Problem Relation Age of Onset  . Diabetes Son   .  Hypertension Paternal Grandfather     History   Social History  . Marital Status: Married    Spouse Name: [name]    Number of Children: 3  . Years of Education: N/A   Occupational History  . [employer]     retired   Social History Main Topics  . Smoking status: Never Smoker   . Smokeless tobacco: Never Used  . Alcohol Use: 0.0 - 0.6 oz/week    0-1 Not specified per week     Comment: in past  . Drug Use: Not on file  . Sexual Activity: Not on file   Other Topics Concern  . Not on file   Social History Narrative   Has living will   Daughter [name] holds health care POA.   Has DNR and we have reviewed this.   Would not want tube feedings if cognitively unaware   Review of Systems Had stopped the aspirin due to some nosebleeds Did take it again this AM No leg swelling but feel felt funny this AM (like they  already had socks on before they got put on) No leg pain Some ongoing cough--relates to sinuses. Doesn't feel sick    Objective:   Physical Exam  Constitutional: He appears well-developed and well-nourished. No distress.  Neck: Normal range of motion. Neck supple. No thyromegaly present.  Cardiovascular: Normal rate and regular rhythm.  Exam reveals no gallop.   Gr 2/6 systolic murmur at apex (MR?)  Pulmonary/Chest: Effort normal and breath sounds normal. No respiratory distress. He has no wheezes. He has no rales.  Musculoskeletal: He exhibits no edema or tenderness.  Lymphadenopathy:    He has no cervical adenopathy.  Psychiatric: He has a normal mood and affect. His behavior is normal.          Assessment & Plan:

## 2014-02-02 NOTE — Progress Notes (Signed)
Pre visit review using our clinic review tool, if applicable. No additional management support is needed unless otherwise documented below in the visit note. 

## 2014-02-03 ENCOUNTER — Encounter: Payer: Self-pay | Admitting: *Deleted

## 2014-02-03 LAB — CBC WITH DIFFERENTIAL/PLATELET
BASOS PCT: 2 % (ref 0.0–3.0)
Basophils Absolute: 0.1 10*3/uL (ref 0.0–0.1)
EOS PCT: 4.1 % (ref 0.0–5.0)
Eosinophils Absolute: 0.2 10*3/uL (ref 0.0–0.7)
HCT: 34.3 % — ABNORMAL LOW (ref 39.0–52.0)
HEMOGLOBIN: 11.2 g/dL — AB (ref 13.0–17.0)
LYMPHS PCT: 31.2 % (ref 12.0–46.0)
Lymphs Abs: 1.9 10*3/uL (ref 0.7–4.0)
MCHC: 32.6 g/dL (ref 30.0–36.0)
MCV: 85.1 fl (ref 78.0–100.0)
MONOS PCT: 7.8 % (ref 3.0–12.0)
Monocytes Absolute: 0.5 10*3/uL (ref 0.1–1.0)
NEUTROS ABS: 3.4 10*3/uL (ref 1.4–7.7)
Neutrophils Relative %: 54.9 % (ref 43.0–77.0)
Platelets: 157 10*3/uL (ref 150.0–400.0)
RBC: 4.03 Mil/uL — AB (ref 4.22–5.81)
RDW: 15.7 % — ABNORMAL HIGH (ref 11.5–15.5)
WBC: 6.1 10*3/uL (ref 4.0–10.5)

## 2014-02-03 LAB — COMPREHENSIVE METABOLIC PANEL
ALBUMIN: 4 g/dL (ref 3.5–5.2)
ALT: 11 U/L (ref 0–53)
AST: 23 U/L (ref 0–37)
Alkaline Phosphatase: 47 U/L (ref 39–117)
BUN: 17 mg/dL (ref 6–23)
CALCIUM: 9.1 mg/dL (ref 8.4–10.5)
CHLORIDE: 105 meq/L (ref 96–112)
CO2: 22 meq/L (ref 19–32)
CREATININE: 0.9 mg/dL (ref 0.4–1.5)
GFR: 80.88 mL/min (ref >60.00)
Glucose, Bld: 93 mg/dL (ref 70–99)
Potassium: 4.1 mEq/L (ref 3.5–5.1)
Sodium: 134 mEq/L — ABNORMAL LOW (ref 135–145)
Total Bilirubin: 0.4 mg/dL (ref 0.2–1.2)
Total Protein: 6.8 g/dL (ref 6.0–8.3)

## 2014-02-03 LAB — T4, FREE: Free T4: 0.91 ng/dL (ref 0.60–1.60)

## 2014-02-03 LAB — D-DIMER, QUANTITATIVE (NOT AT ARMC): D DIMER QUANT: 0.62 ug{FEU}/mL — AB (ref 0.00–0.48)

## 2014-02-06 ENCOUNTER — Ambulatory Visit: Payer: Self-pay | Admitting: Internal Medicine

## 2014-02-06 DIAGNOSIS — M810 Age-related osteoporosis without current pathological fracture: Secondary | ICD-10-CM | POA: Diagnosis not present

## 2014-02-07 DIAGNOSIS — H40003 Preglaucoma, unspecified, bilateral: Secondary | ICD-10-CM | POA: Diagnosis not present

## 2014-02-09 ENCOUNTER — Encounter: Payer: Self-pay | Admitting: Internal Medicine

## 2014-02-09 ENCOUNTER — Telehealth: Payer: Self-pay | Admitting: Internal Medicine

## 2014-02-09 NOTE — Telephone Encounter (Signed)
Note from [place] at [facility] was abnormal with T -3.8 Is on actonel but wondering if he needed something different like reclast.  I am most comfortable with continuing the actonel since he hasn't had a 5 year course. Will discuss with patient at my next visit

## 2014-02-13 ENCOUNTER — Encounter: Payer: Self-pay | Admitting: Internal Medicine

## 2014-03-03 ENCOUNTER — Telehealth: Payer: Self-pay | Admitting: *Deleted

## 2014-03-03 NOTE — Telephone Encounter (Signed)
Spoke to pt who lives at [facility]; must confirm whether or not he has already had it

## 2014-03-07 ENCOUNTER — Ambulatory Visit: Payer: Self-pay | Admitting: Internal Medicine

## 2014-03-07 DIAGNOSIS — Z8781 Personal history of (healed) traumatic fracture: Secondary | ICD-10-CM | POA: Diagnosis not present

## 2014-03-07 DIAGNOSIS — Z79899 Other long term (current) drug therapy: Secondary | ICD-10-CM | POA: Diagnosis not present

## 2014-03-07 DIAGNOSIS — M129 Arthropathy, unspecified: Secondary | ICD-10-CM | POA: Diagnosis not present

## 2014-03-07 DIAGNOSIS — Z85828 Personal history of other malignant neoplasm of skin: Secondary | ICD-10-CM | POA: Diagnosis not present

## 2014-03-07 DIAGNOSIS — R972 Elevated prostate specific antigen [PSA]: Secondary | ICD-10-CM | POA: Diagnosis not present

## 2014-03-07 DIAGNOSIS — D649 Anemia, unspecified: Secondary | ICD-10-CM | POA: Diagnosis not present

## 2014-03-07 DIAGNOSIS — D696 Thrombocytopenia, unspecified: Secondary | ICD-10-CM | POA: Diagnosis not present

## 2014-03-07 DIAGNOSIS — Z79818 Long term (current) use of other agents affecting estrogen receptors and estrogen levels: Secondary | ICD-10-CM | POA: Diagnosis not present

## 2014-03-07 DIAGNOSIS — Z96652 Presence of left artificial knee joint: Secondary | ICD-10-CM | POA: Diagnosis not present

## 2014-03-07 DIAGNOSIS — M818 Other osteoporosis without current pathological fracture: Secondary | ICD-10-CM | POA: Diagnosis not present

## 2014-03-07 DIAGNOSIS — C61 Malignant neoplasm of prostate: Secondary | ICD-10-CM | POA: Diagnosis not present

## 2014-03-07 DIAGNOSIS — K5900 Constipation, unspecified: Secondary | ICD-10-CM | POA: Diagnosis not present

## 2014-03-07 DIAGNOSIS — Z7982 Long term (current) use of aspirin: Secondary | ICD-10-CM | POA: Diagnosis not present

## 2014-03-07 DIAGNOSIS — M1990 Unspecified osteoarthritis, unspecified site: Secondary | ICD-10-CM | POA: Diagnosis not present

## 2014-03-07 LAB — CREATININE, SERUM
CREATININE: 1.04 mg/dL (ref 0.60–1.30)
EGFR (African American): >60
EGFR (Non-African Amer.): >60

## 2014-03-07 LAB — CBC CANCER CENTER
BASOS ABS: 0.2 x10 3/mm — AB (ref 0.0–0.1)
BASOS PCT: 3.3 %
EOS ABS: 0.3 x10 3/mm (ref 0.0–0.7)
Eosinophil %: 6.1 %
HCT: 34.2 % — AB (ref 40.0–52.0)
HGB: 11.3 g/dL — AB (ref 13.0–18.0)
LYMPHS PCT: 31.9 %
Lymphocyte #: 1.8 x10 3/mm (ref 1.0–3.6)
MCH: 27.8 pg (ref 26.0–34.0)
MCHC: 33 g/dL (ref 32.0–36.0)
MCV: 84 fL (ref 80–100)
Monocyte #: 0.5 x10 3/mm (ref 0.2–1.0)
Monocyte %: 9.3 %
NEUTROS PCT: 49.4 %
Neutrophil #: 2.8 x10 3/mm (ref 1.4–6.5)
Platelet: 143 x10 3/mm — ABNORMAL LOW (ref 150–440)
RBC: 4.05 10*6/uL — ABNORMAL LOW (ref 4.40–5.90)
RDW: 15.2 % — AB (ref 11.5–14.5)
WBC: 5.6 x10 3/mm (ref 3.8–10.6)

## 2014-03-07 LAB — HEPATIC FUNCTION PANEL A (ARMC)
ALK PHOS: 69 U/L
Albumin: 3.7 g/dL (ref 3.4–5.0)
BILIRUBIN TOTAL: 0.3 mg/dL (ref 0.2–1.0)
SGOT(AST): 16 U/L (ref 15–37)
SGPT (ALT): 19 U/L
TOTAL PROTEIN: 7 g/dL (ref 6.4–8.2)

## 2014-03-08 LAB — PSA: PSA: 7.9 ng/mL — ABNORMAL HIGH (ref 0.0–4.0)

## 2014-03-09 ENCOUNTER — Telehealth: Payer: Self-pay

## 2014-03-09 NOTE — Telephone Encounter (Signed)
Let them know I think reclast makes sense.  I see him in his apt at [facility] doesn't come here. They should make arrangements to give him the reclast there

## 2014-03-09 NOTE — Telephone Encounter (Signed)
Left message asking the cancer center to return my call

## 2014-03-09 NOTE — Telephone Encounter (Signed)
Sherry with [facility], Dr Candelaria Stagers office left v/m; pts PSA has increased and is going to restart Casidex po and in 2 weeks start on lupron injections.  Dr Ma Hillock noted pt has osteoporosis and pt needs stronger bifosfinate; Dr Ma Hillock was thinking reclast and wants Dr Alla German opinion. Judeen Hammans needs to know if [facility] has reclast and will give reclast or will the [facility] need to give that to the pt. Sherry request cb.

## 2014-03-10 NOTE — Telephone Encounter (Signed)
[facility] returned Dee's call.  I told him what Dr.Letvak said.  He said they would do everything at the [facility].

## 2014-03-23 ENCOUNTER — Non-Acute Institutional Stay: Payer: Medicare Other | Admitting: Internal Medicine

## 2014-03-23 ENCOUNTER — Encounter: Payer: Self-pay | Admitting: Internal Medicine

## 2014-03-23 VITALS — BP 96/54 | HR 70 | Temp 97.8°F | Resp 18 | Wt 169.0 lb

## 2014-03-23 DIAGNOSIS — M150 Primary generalized (osteo)arthritis: Secondary | ICD-10-CM

## 2014-03-23 DIAGNOSIS — C61 Malignant neoplasm of prostate: Secondary | ICD-10-CM | POA: Diagnosis not present

## 2014-03-23 DIAGNOSIS — M810 Age-related osteoporosis without current pathological fracture: Secondary | ICD-10-CM | POA: Diagnosis not present

## 2014-03-23 DIAGNOSIS — R000 Tachycardia, unspecified: Secondary | ICD-10-CM | POA: Diagnosis not present

## 2014-03-23 DIAGNOSIS — M159 Polyosteoarthritis, unspecified: Secondary | ICD-10-CM

## 2014-03-23 DIAGNOSIS — Z79818 Long term (current) use of other agents affecting estrogen receptors and estrogen levels: Secondary | ICD-10-CM | POA: Diagnosis not present

## 2014-03-23 DIAGNOSIS — G463 Brain stem stroke syndrome: Secondary | ICD-10-CM | POA: Diagnosis not present

## 2014-03-23 DIAGNOSIS — Z7982 Long term (current) use of aspirin: Secondary | ICD-10-CM | POA: Diagnosis not present

## 2014-03-23 DIAGNOSIS — K5909 Other constipation: Secondary | ICD-10-CM | POA: Diagnosis not present

## 2014-03-23 DIAGNOSIS — M818 Other osteoporosis without current pathological fracture: Secondary | ICD-10-CM | POA: Diagnosis not present

## 2014-03-23 DIAGNOSIS — D696 Thrombocytopenia, unspecified: Secondary | ICD-10-CM | POA: Diagnosis not present

## 2014-03-23 DIAGNOSIS — D649 Anemia, unspecified: Secondary | ICD-10-CM | POA: Diagnosis not present

## 2014-03-23 LAB — CALCIUM: Calcium, Total: 8.3 mg/dL — ABNORMAL LOW (ref 8.5–10.1)

## 2014-03-23 LAB — CREATININE, SERUM
CREATININE: 1 mg/dL (ref 0.60–1.30)
EGFR (African American): >60
EGFR (Non-African Amer.): >60

## 2014-03-23 NOTE — Assessment & Plan Note (Signed)
No recent symptoms On aspirin

## 2014-03-23 NOTE — Assessment & Plan Note (Signed)
Hasn't recurred that we know Has sense of his breathing at times but no clear dyspnea. Not clear what this is---observation only for now

## 2014-03-23 NOTE — Assessment & Plan Note (Signed)
High risk with T -3.8 in hip Dr Ma Hillock is planning reclast On calcium and vitamin D

## 2014-03-23 NOTE — Assessment & Plan Note (Signed)
This is better Hasn't needed the tramadol in some time

## 2014-03-23 NOTE — Assessment & Plan Note (Signed)
Doing fine on his current regimen

## 2014-03-23 NOTE — Progress Notes (Signed)
Subjective:    Patient ID: Benjamin Villarreal, male    DOB: 21-Apr-1920, 81 y.o.   MRN: [id number]  HPI Reviewed status with Leafy Ro RN Follow up today  PSA has been going up. Last one was 7.9 Now on casodex and will be getting his first lupron from Dr Ma Hillock today Also will get his first reclast Recent DEXA showed T score of -3.8   He notes his daughter was concerned about whether he should get any Rx at all Discussed this issue given his age Since he is good functionally -- it may be reasonable to have at least 1 lupron shot and see how he does  Some feet and lower leg sensory changes in AM Arthritis not too bad  Has had occasional sense of SOB Notes "awareness" of breathing No apparent recurrence of tachycardia No chest pain No leg swelling  Bowels seem fine on his regimen  Current Outpatient Prescriptions on File Prior to Visit  Medication Sig Dispense Refill  . aspirin 81 MG EC tablet Take 81 mg by mouth every other day.     . Calcium Carbonate-Vitamin D (CALCIUM-D) 600-400 MG-UNIT TABS Take 1 tablet by mouth daily.    . cholecalciferol (VITAMIN D) 1000 UNITS tablet Take 1,000 Units by mouth daily.      . ciclopirox (LOPROX) 0.77 % cream Apply topically 2 (two) times daily.      . clindamycin (CLEOCIN) 150 MG capsule Take 150 mg by mouth. TAKES 1 HOUR PRIOR TO DENTAL WORK    . cyclobenzaprine (FLEXERIL) 5 MG tablet Take 1 tablet (5 mg total) by mouth at bedtime as needed. 90 tablet 3  . glucosamine-chondroitin 500-400 MG tablet Take 3 tablets by mouth daily.     Marland Kitchen loratadine (CLARITIN) 10 MG tablet Take 20 mg by mouth at bedtime.    . mineral oil-hydrophilic petrolatum (AQUAPHOR) ointment Apply 1 application topically. APPLY 1 APPLICATION ON LEFT BIG TOE DAILY    . polyethylene glycol powder (MIRALAX) powder Take 17 g by mouth 2 (two) times daily. (Patient taking differently: Take 17 g by mouth 3 (three) times daily. ) 527 g 3  . sennosides-docusate sodium (SENOKOT-S) 8.6-50 MG  tablet Take 4 tablets by mouth daily.    . traMADol (ULTRAM) 50 MG tablet Take 1 tablet (50 mg total) by mouth 3 (three) times daily as needed. 270 tablet 0  . triamcinolone cream (KENALOG) 0.1 % Apply topically 2 (two) times daily.    . vitamin B-12 (CYANOCOBALAMIN) 1000 MCG tablet Take 1 tablet (1,000 mcg total) by mouth daily. 90 tablet 3  . vitamin C (ASCORBIC ACID) 500 MG tablet Take 500 mg by mouth daily.     No current facility-administered medications on file prior to visit.    Allergies  Allergen Reactions  . Other     RAW ONIONS- SINUS REACTION    Past Medical History  Diagnosis Date  . Osteoporosis     T -3.8 (01/2014)  . Arthritis   . Prostate cancer     surgery then lupron  . Colon polyps     adenomatous  . Other constipation     chronic  . Unspecified venous (peripheral) insufficiency   . Vitamin B12 deficiency     Past Surgical History  Procedure Laterality Date  . Cataract extraction  2000, 2003    both eyes  . Basal cell carcinoma excision  2004    Left side of face  . Tibia fracture surgery  1999  .  Prostate surgery  1996    cancer  . Volvulus reduction  12/10  . Joint replacement  2005    Left knee  . Joint replacement  12/13    Right knee    Family History  Problem Relation Age of Onset  . Diabetes Son   . Hypertension Paternal Grandfather     History   Social History  . Marital Status: Married    Spouse Name: [name]    Number of Children: 3  . Years of Education: N/A   Occupational History  . [employer]     retired   Social History Main Topics  . Smoking status: Never Smoker   . Smokeless tobacco: Never Used  . Alcohol Use: 0.0 - 0.6 oz/week    0-1 Not specified per week     Comment: in past  . Drug Use: Not on file  . Sexual Activity: Not on file   Other Topics Concern  . Not on file   Social History Narrative   Has living will   Daughter [name] holds health care POA.   Has DNR and we have reviewed  this.   Would not want tube feedings if cognitively unaware   Review of Systems Mild cough--seems to be from sinus drainage No fever Appetite is fine Weight is stable Sleeps okay---nocturia x 2-3 is stable (and he falls right back to sleep)    Objective:   Physical Exam  Constitutional: He appears well-developed and well-nourished. No distress.  Neck: Normal range of motion. Neck supple. No thyromegaly present.  Cardiovascular: Normal rate and regular rhythm.  Exam reveals no gallop.   Gr 2/6 systolic murmur loudest in tricuspid area  Pulmonary/Chest: Effort normal and breath sounds normal. No respiratory distress. He has no wheezes. He has no rales.  Abdominal: Soft. There is no tenderness.  Musculoskeletal: He exhibits no edema or tenderness.  Lymphadenopathy:    He has no cervical adenopathy.  Psychiatric: He has a normal mood and affect. His behavior is normal.          Assessment & Plan:

## 2014-03-23 NOTE — Assessment & Plan Note (Signed)
Rise of PSA from 2.6 (4/15) to 7.9 now It seems reasonable to try lupron and casodex as planned

## 2014-03-27 ENCOUNTER — Ambulatory Visit: Payer: Self-pay | Admitting: Internal Medicine

## 2014-04-05 ENCOUNTER — Ambulatory Visit (INDEPENDENT_AMBULATORY_CARE_PROVIDER_SITE_OTHER): Payer: Medicare Other | Admitting: Podiatry

## 2014-04-05 DIAGNOSIS — B351 Tinea unguium: Secondary | ICD-10-CM | POA: Diagnosis not present

## 2014-04-05 DIAGNOSIS — M79676 Pain in unspecified toe(s): Secondary | ICD-10-CM | POA: Diagnosis not present

## 2014-04-05 NOTE — Progress Notes (Signed)
He presents today with a chief complaint of painful elongated toenails 1 through 5 bilateral.  Objective: Pulses are palpable bilateral. Nails are thick yellow dystrophic with mycotic and painful palpation bilateral.  Assessment: Pain in limb secondary to onychomycosis.  Plan: Debridement of nails 1 through 5 bilateral cover service secondary to pain.

## 2014-04-13 DIAGNOSIS — L820 Inflamed seborrheic keratosis: Secondary | ICD-10-CM | POA: Diagnosis not present

## 2014-04-13 DIAGNOSIS — Z85828 Personal history of other malignant neoplasm of skin: Secondary | ICD-10-CM | POA: Diagnosis not present

## 2014-04-13 DIAGNOSIS — L578 Other skin changes due to chronic exposure to nonionizing radiation: Secondary | ICD-10-CM | POA: Diagnosis not present

## 2014-04-13 DIAGNOSIS — L853 Xerosis cutis: Secondary | ICD-10-CM | POA: Diagnosis not present

## 2014-04-14 ENCOUNTER — Other Ambulatory Visit: Payer: Self-pay | Admitting: Internal Medicine

## 2014-06-13 NOTE — Op Note (Signed)
PATIENT NAME:  Benjamin Villarreal, CUERVO MR#:  [id number] DATE OF BIRTH:  January 01, 1921  DATE OF PROCEDURE:  02/10/2012  PREOPERATIVE DIAGNOSIS: Severe right knee osteoarthritis.   POSTOPERATIVE DIAGNOSIS: Severe right knee osteoarthritis.   PROCEDURE: Right total knee replacement.   SURGEON: Laurene Footman, MD  ASSISTANT:  Rachelle Hora, PA-C  ANESTHESIA: Spinal.   DESCRIPTION OF PROCEDURE: The patient was brought to the operating room and, after adequate spinal anesthesia was obtained, the right leg was prepped and draped in the usual sterile fashion with a tourniquet applied to the upper thigh and the Alvarado legholder utilized. After appropriate patient identification and timeout procedures were completed, the leg was exsanguinated and tourniquet elevated. A midline skin incision followed by medial parapatellar arthrotomy were carried out. Inspection of the joint revealed eburnation of the patella with grooves worn into both the femoral trochlea and lateral condyle and lateral facet of the patella. The lateral compartment also had eburnated bone, medial compartment had complete loss of articular cartilage as well. The ACL and infrapatellar fat pad were excised and the proximal tibia was exposed to allow for application of the Medacta cutting block. The proximal tibia cut was carried out with the menisci removed. The femoral cutting block was then applied to the distal femur, in the appropriate location, and distal cut made with the appropriate rotation with distal drill holes. Next, the #5 femoral cutting block was applied, anterior and posterior chamfer cuts made, and trial components placed with a 10 mm implant. There was good stability and flexion and extension. Drill holes were made in the distal femur as well as the notch cut. On the tibial side, the trial was pinned in place and then the proximal tibial drill hole made followed by the V-notching cut to hold the implant in place. Next, the patella was cut  using the patellar cutting guide getting down flush with the eburnated lateral facet. Drill holes were made and the patella sized to a size 3. At this point, a minimal lateral release was carried out and the trial components were removed. The posterior capsule was infiltrated with Sensorcaine and morphine with saline. Next, the wound was irrigated with pulse lavage and dried. The tibial component was cemented into place first with excess cement removed. Following this the polyethylene component was inserted and snapped into place. The femoral component was then placed, again with excess cement removed and the knee held in extension. The patellar button was clamped into place as well with all components cemented. After the cement had set, excess cement again checked and removed, the knee again thoroughly irrigated and the tourniquet let down. Hemostasis was checked with electrocautery. The arthrotomy was repaired using a heavy quill suture, 2-0 quill subcutaneously and skin staples. Xeroform, 4 x 4's, ABD, Webril, Ace wrap and Polar Care were applied. The patient was sent to the recovery room in stable condition.   ESTIMATED BLOOD LOSS: 25 mL.   TOURNIQUET TIME: 59 minutes.  IMPLANTS: Medacta GMK Primary Total Knee System, size _____ right standard femur, size 4 right fixed tibia with a size 4 10-mm UC tibial insert and a size 3 patella.   SPECIMENS: Cut ends of bone.   CONDITION: To recovery room stable.  ____________________________ Laurene Footman, MD mjm:sb D: 02/10/2012 21:43:28 ET T: 02/11/2012 09:58:06 ET JOB#: [id number]  cc: Laurene Footman, MD, <Dictator> Laurene Footman MD ELECTRONICALLY SIGNED 02/11/2012 13:06

## 2014-06-13 NOTE — Discharge Summary (Signed)
PATIENT NAME:  Benjamin Villarreal, Benjamin Villarreal MR#:  [id number] DATE OF BIRTH:  1920/08/18  DATE OF ADMISSION:  02/10/2012 DATE OF DISCHARGE:  02/13/2012   ADMITTING DIAGNOSIS: Severe right knee osteoarthritis.   DISCHARGE DIAGNOSIS: Severe right knee osteoarthritis.   PROCEDURE: Right total knee replacement.   SURGEON: Laurene Footman, M.D.   ASSISTANT: Rachelle Hora, PA-C.   ANESTHESIA: Spinal.   ESTIMATED BLOOD LOSS: 25 mL   TOURNIQUET TIME: 59 minutes.   SPECIMEN: Cut ends of bone.   CONDITION: To recovery room stable.   HISTORY OF PRESENT ILLNESS: The patient is an 81 year old who had severe long-term right knee pain with extensive osteoarthritis. He had previous arthroscopy without relief. He has had marked degenerative changes to his knee. He has had repeated corticosteroid injection as well as Synvisc with very short term relief and very large effusions. He has pain with activity, pain at rest and difficulty with activities of daily living including taking care of his apartment and getting to and from meals.   PHYSICAL EXAMINATION:  LUNGS: Clear to auscultation.  HEART: Regular rate and rhythm.  HEENT: Remarkable for hearing aid and wearing glasses. He has limitation of neck range of motion is well.   PHYSICAL EXAMINATION: On examination he has range of motion of 12 to 120 degrees with varus deformity that is not passively correctable. There is marked crepitation to range of motion. He does have some diminished sensation to his foot. He has trace dorsalis pedis pulse without clubbing, cyanosis or edema.   HOSPITAL COURSE: The patient was admitted to the hospital on 02/10/2012. He had surgery that same day and was brought to the Orthopaedic floor from the PACU in stable condition. On postoperative day one, the patient was doing well. Vital signs as well as blood work showed that everything was within normal limits. The patient continued to progress with physical therapy. His pain remained under  control, and by 02/13/2012, the patient was stable and ready for discharge to [facility].   CONDITION AT DISCHARGE: Stable.   DISCHARGE INSTRUCTIONS: The patient's activity should consist of weight-bearing as tolerated. He is out of bed 2 times a day. He is to wear TED hose bilaterally. He can take off at night. He should use incentive spirometry every one hour. He should cough and deep breathe every two hours. He should wear the Polar Care Unit maintaining temperature between 40 and 50 degrees. He should be evaluated and treated by Physical Therapy for difficulty walking. He is weight-bearing as tolerated. OT should evaluate and treat the patient for muscle weakness and assistance with activities of daily living. He has a follow-up appointment in two weeks with [facility]. The patient should call for appointment. He should resume a regular diet.   DISCHARGE MEDICATIONS:  1. Aspirin 81 mg oral daily  2. Flexeril 5 mg oral 3 times daily p.r.n. muscle spasms  3. Polyethylene glycol 17 grams oral daily p.r.n. constipation.  4. Ascorbic acid tablet 500 mg oral b.i.d.  5. Cholecalciferol tablet 1000 units oral daily.  6. Docusate senna 50/8.6 mg tablet 1 tablet oral b.i.d.  7. Mag-al Plus XS 30 mL oral every 6 hours p.r.n. for indigestion or heartburn.  8. Enema soapsuds p.r.n. frequency daily. 9. Xarelto 10 mg oral daily in the morning for 9 days.  10. Milk of Magnesia 30 mL b.i.d. p.r.n. constipation.  11. Ducolax 10 mg rectal daily p.r.n. constipation.  12. Zolpidem 5 mg oral at bedtime p.r.n. insomnia.  13.  Oxycodone 5 to 10 mg oral q.4 hours p.r.n. pain. 14. Tylenol 500 to 1000 mg oral q.4 hours p.r.n. pain or temp greater than 100.4.    ____________________________ Duanne Guess, PA-C tcg:jm D: 02/13/2012 12:33:53 ET T: 02/14/2012 15:45:33 ET JOB#: [id number]  cc: Duanne Guess, PA-C, <Dictator> Duanne Guess [place] ELECTRONICALLY SIGNED 02/17/2012 11:59

## 2014-06-22 ENCOUNTER — Non-Acute Institutional Stay (INDEPENDENT_AMBULATORY_CARE_PROVIDER_SITE_OTHER): Payer: Medicare Other | Admitting: Internal Medicine

## 2014-06-22 ENCOUNTER — Encounter: Payer: Self-pay | Admitting: Internal Medicine

## 2014-06-22 VITALS — BP 118/58 | HR 56 | Temp 98.3°F | Resp 22 | Wt 173.0 lb

## 2014-06-22 DIAGNOSIS — C61 Malignant neoplasm of prostate: Secondary | ICD-10-CM

## 2014-06-22 NOTE — Progress Notes (Signed)
   Subjective:    Patient ID: Benjamin Villarreal, male    DOB: [birth year], 81 y.o.   MRN: [id number]  HPI Resident unaware of visit appt Was out of apartment  Will reschedule   Review of Systems     Objective:   Physical Exam        Assessment & Plan:

## 2014-06-22 NOTE — Assessment & Plan Note (Signed)
Will re-schedule visit.

## 2014-07-10 ENCOUNTER — Ambulatory Visit (INDEPENDENT_AMBULATORY_CARE_PROVIDER_SITE_OTHER): Payer: Medicare Other | Admitting: Podiatry

## 2014-07-10 DIAGNOSIS — M79676 Pain in unspecified toe(s): Secondary | ICD-10-CM | POA: Diagnosis not present

## 2014-07-10 DIAGNOSIS — B351 Tinea unguium: Secondary | ICD-10-CM | POA: Diagnosis not present

## 2014-07-10 NOTE — Progress Notes (Signed)
He presents today with a chief complaint of painful elongated toenails.  Objective: Nails are thick yellow dystrophic onychomycotic and painful palpation.  Assessment: Pain in limb secondary to onychomycosis 1 through 5 bilateral.  Plan: Debridement of nails 1 through 5 bilateral.

## 2014-07-13 ENCOUNTER — Encounter: Payer: Self-pay | Admitting: Internal Medicine

## 2014-07-13 ENCOUNTER — Non-Acute Institutional Stay: Payer: Medicare Other | Admitting: Internal Medicine

## 2014-07-13 VITALS — BP 118/58 | HR 62 | Temp 98.0°F | Resp 22 | Wt 170.0 lb

## 2014-07-13 DIAGNOSIS — C61 Malignant neoplasm of prostate: Secondary | ICD-10-CM | POA: Diagnosis not present

## 2014-07-13 DIAGNOSIS — D696 Thrombocytopenia, unspecified: Secondary | ICD-10-CM | POA: Insufficient documentation

## 2014-07-13 DIAGNOSIS — G463 Brain stem stroke syndrome: Secondary | ICD-10-CM

## 2014-07-13 DIAGNOSIS — K5909 Other constipation: Secondary | ICD-10-CM | POA: Diagnosis not present

## 2014-07-13 DIAGNOSIS — M810 Age-related osteoporosis without current pathological fracture: Secondary | ICD-10-CM | POA: Diagnosis not present

## 2014-07-13 DIAGNOSIS — I872 Venous insufficiency (chronic) (peripheral): Secondary | ICD-10-CM

## 2014-07-13 NOTE — Progress Notes (Signed)
Subjective:    Patient ID: Benjamin Villarreal, male    DOB: 11-10-1920, 81 y.o.   MRN: [id number]  HPI Here for follow up of multiple medical conditions Reviewed status with Leafy Ro RN here  Continues with the oncologist Dr Ma Hillock On lupron every few months Recent reclast as well No recent PSA  Bowels have been okay Continues on the same regimen  No recent vertigo Balance has been okay  Walks well with rollator--uses in apartment also Remains independent except for getting compression socks on  Edema has been controlled Uses the compression socks  Recent podiatry visit was uneventful  Current Outpatient Prescriptions on File Prior to Visit  Medication Sig Dispense Refill  . aspirin 81 MG EC tablet Take 81 mg by mouth every other day.     . Calcium Carbonate-Vitamin D (CALCIUM-D) 600-400 MG-UNIT TABS Take 1 tablet by mouth daily.    . cholecalciferol (VITAMIN D) 1000 UNITS tablet Take 1,000 Units by mouth daily.      . ciclopirox (LOPROX) 0.77 % cream Apply topically 2 (two) times daily.      . clindamycin (CLEOCIN) 150 MG capsule Take 150 mg by mouth. TAKES 1 HOUR PRIOR TO DENTAL WORK    . cyclobenzaprine (FLEXERIL) 5 MG tablet Take 1 tablet (5 mg total) by mouth at bedtime as needed. 90 tablet 3  . glucosamine-chondroitin 500-400 MG tablet Take 3 tablets by mouth daily.     Marland Kitchen leuprolide (LUPRON) 30 MG injection Inject 30 mg into the muscle every 4 (four) months. Dose and interval not clear yet    . loratadine (CLARITIN) 10 MG tablet TAKE 1 TABLET DAILY (Patient taking differently: TAKE 2 TABLETS DAILY) 90 tablet 2  . mineral oil-hydrophilic petrolatum (AQUAPHOR) ointment Apply 1 application topically. APPLY 1 APPLICATION ON LEFT BIG TOE DAILY    . polyethylene glycol powder (MIRALAX) powder Take 17 g by mouth 2 (two) times daily. (Patient taking differently: Take 17 g by mouth 3 (three) times daily. ) 527 g 3  . sennosides-docusate sodium (SENOKOT-S) 8.6-50 MG tablet Take 4 tablets by  mouth daily.    . traMADol (ULTRAM) 50 MG tablet Take 1 tablet (50 mg total) by mouth 3 (three) times daily as needed. 270 tablet 0  . triamcinolone cream (KENALOG) 0.1 % Apply topically 2 (two) times daily.    . vitamin B-12 (CYANOCOBALAMIN) 1000 MCG tablet Take 1 tablet (1,000 mcg total) by mouth daily. 90 tablet 3  . vitamin C (ASCORBIC ACID) 500 MG tablet Take 500 mg by mouth daily.    . zoledronic acid (RECLAST) 5 MG/100ML SOLN injection Inject 5 mg into the vein once. ?yearly     No current facility-administered medications on file prior to visit.    Allergies  Allergen Reactions  . Other     RAW ONIONS- SINUS REACTION    Past Medical History  Diagnosis Date  . Osteoporosis     T -3.8 (01/2014)  . Arthritis   . Prostate cancer     surgery then lupron  . Colon polyps     adenomatous  . Other constipation     chronic  . Unspecified venous (peripheral) insufficiency   . Vitamin B12 deficiency     Past Surgical History  Procedure Laterality Date  . Cataract extraction  2000, 2003    both eyes  . Basal cell carcinoma excision  2004    Left side of face  . Tibia fracture surgery  1999  . Prostate surgery  1996    cancer  . Volvulus reduction  12/10  . Joint replacement  2005    Left knee  . Joint replacement  12/13    Right knee    Family History  Problem Relation Age of Onset  . Diabetes Son   . Hypertension Paternal Grandfather     History   Social History  . Marital Status: Married    Spouse Name: [name]  . Number of Children: 3  . Years of Education: N/A   Occupational History  . [employer]     retired   Social History Main Topics  . Smoking status: Never Smoker   . Smokeless tobacco: Never Used  . Alcohol Use: 0.0 - 0.6 oz/week    0-1 Standard drinks or equivalent per week     Comment: in past  . Drug Use: Not on file  . Sexual Activity: Not on file   Other Topics Concern  . Not on file   Social History Narrative   Has living  will   Daughter [name] holds health care POA.   Has DNR and we have reviewed this.   Would not want tube feedings if cognitively unaware   Review of Systems Sleeps well Appetite is good Weight stable     Objective:   Physical Exam  Constitutional: He appears well-developed and well-nourished. No distress.  Neck: Normal range of motion. Neck supple. No thyromegaly present.  Cardiovascular: Normal rate and regular rhythm.  Exam reveals no gallop.   Gr 3/6 systolic murmur in apex  Pulmonary/Chest: Effort normal and breath sounds normal. No respiratory distress. He has no wheezes. He has no rales.  Abdominal: Soft. There is no tenderness.  Musculoskeletal: He exhibits no edema or tenderness.  Psychiatric: He has a normal mood and affect. His behavior is normal.          Assessment & Plan:

## 2014-07-13 NOTE — Assessment & Plan Note (Signed)
Mostly risk due to lupron On calcium and D Walks regularly Recent reclast infusion

## 2014-07-13 NOTE — Assessment & Plan Note (Signed)
Satisfied with current regimen Will continue

## 2014-07-13 NOTE — Assessment & Plan Note (Signed)
Controlled with compression socks

## 2014-07-13 NOTE — Assessment & Plan Note (Signed)
No recent vertigo Still on aspirin daily

## 2014-07-13 NOTE — Assessment & Plan Note (Signed)
And mild anemia Dr Ma Hillock follows him now

## 2014-07-13 NOTE — Assessment & Plan Note (Signed)
No clear mets Ongoing lupron Rx which he seems to be tolerating well

## 2014-07-18 ENCOUNTER — Other Ambulatory Visit: Payer: Medicare Other

## 2014-07-18 ENCOUNTER — Ambulatory Visit: Payer: Medicare Other

## 2014-07-18 ENCOUNTER — Ambulatory Visit: Payer: Medicare Other | Admitting: Family Medicine

## 2014-07-20 ENCOUNTER — Encounter: Payer: Medicare Other | Admitting: Internal Medicine

## 2014-07-24 ENCOUNTER — Other Ambulatory Visit: Payer: Self-pay | Admitting: *Deleted

## 2014-07-24 DIAGNOSIS — C61 Malignant neoplasm of prostate: Secondary | ICD-10-CM

## 2014-07-24 MED ORDER — LEUPROLIDE ACETATE (4 MONTH) 30 MG IM KIT: 30.0000 mg | PACK | Freq: Once | INTRAMUSCULAR | Status: AC

## 2014-07-25 ENCOUNTER — Inpatient Hospital Stay: Payer: Medicare Other

## 2014-07-25 ENCOUNTER — Inpatient Hospital Stay: Payer: Medicare Other | Attending: Internal Medicine | Admitting: Internal Medicine

## 2014-07-25 VITALS — BP 137/74 | HR 64 | Ht 74.0 in | Wt 171.1 lb

## 2014-07-25 DIAGNOSIS — Z7982 Long term (current) use of aspirin: Secondary | ICD-10-CM | POA: Diagnosis not present

## 2014-07-25 DIAGNOSIS — D696 Thrombocytopenia, unspecified: Secondary | ICD-10-CM

## 2014-07-25 DIAGNOSIS — E538 Deficiency of other specified B group vitamins: Secondary | ICD-10-CM | POA: Diagnosis not present

## 2014-07-25 DIAGNOSIS — M818 Other osteoporosis without current pathological fracture: Secondary | ICD-10-CM

## 2014-07-25 DIAGNOSIS — Z85828 Personal history of other malignant neoplasm of skin: Secondary | ICD-10-CM | POA: Diagnosis not present

## 2014-07-25 DIAGNOSIS — C61 Malignant neoplasm of prostate: Secondary | ICD-10-CM

## 2014-07-25 DIAGNOSIS — K5900 Constipation, unspecified: Secondary | ICD-10-CM | POA: Diagnosis not present

## 2014-07-25 DIAGNOSIS — K635 Polyp of colon: Secondary | ICD-10-CM

## 2014-07-25 DIAGNOSIS — Z79899 Other long term (current) drug therapy: Secondary | ICD-10-CM | POA: Diagnosis not present

## 2014-07-25 DIAGNOSIS — Z79818 Long term (current) use of other agents affecting estrogen receptors and estrogen levels: Secondary | ICD-10-CM | POA: Diagnosis not present

## 2014-07-25 DIAGNOSIS — M129 Arthropathy, unspecified: Secondary | ICD-10-CM | POA: Diagnosis not present

## 2014-07-25 DIAGNOSIS — D649 Anemia, unspecified: Secondary | ICD-10-CM | POA: Diagnosis not present

## 2014-07-25 DIAGNOSIS — M4856XS Collapsed vertebra, not elsewhere classified, lumbar region, sequela of fracture: Secondary | ICD-10-CM | POA: Diagnosis not present

## 2014-07-25 LAB — HEPATIC FUNCTION PANEL
ALT: 12 U/L — ABNORMAL LOW (ref 17–63)
AST: 25 U/L (ref 15–41)
Albumin: 3.9 g/dL (ref 3.5–5.0)
Alkaline Phosphatase: 47 U/L (ref 38–126)
Bilirubin, Direct: <0.1 mg/dL — ABNORMAL LOW (ref 0.1–0.5)
TOTAL PROTEIN: 7 g/dL (ref 6.5–8.1)
Total Bilirubin: 0.4 mg/dL (ref 0.3–1.2)

## 2014-07-25 LAB — CBC WITH DIFFERENTIAL/PLATELET
Basophils Absolute: 0.1 10*3/uL (ref 0–0.1)
Basophils Relative: 3 %
EOS ABS: 0.3 10*3/uL (ref 0–0.7)
Eosinophils Relative: 5 %
HEMATOCRIT: 34.5 % — AB (ref 40.0–52.0)
Hemoglobin: 11.3 g/dL — ABNORMAL LOW (ref 13.0–18.0)
LYMPHS ABS: 1.4 10*3/uL (ref 1.0–3.6)
LYMPHS PCT: 27 %
MCH: 28.2 pg (ref 26.0–34.0)
MCHC: 32.6 g/dL (ref 32.0–36.0)
MCV: 86.3 fL (ref 80.0–100.0)
Monocytes Absolute: 0.5 10*3/uL (ref 0.2–1.0)
Monocytes Relative: 11 %
NEUTROS PCT: 54 %
Neutro Abs: 2.8 10*3/uL (ref 1.4–6.5)
PLATELETS: 141 10*3/uL — AB (ref 150–440)
RBC: 4 MIL/uL — ABNORMAL LOW (ref 4.40–5.90)
RDW: 15.3 % — ABNORMAL HIGH (ref 11.5–14.5)
WBC: 5 10*3/uL (ref 3.8–10.6)

## 2014-07-25 LAB — CALCIUM: Calcium: 8.3 mg/dL — ABNORMAL LOW (ref 8.9–10.3)

## 2014-07-25 LAB — CREATININE, SERUM
Creatinine, Ser: 0.76 mg/dL (ref 0.61–1.24)
GFR calc Af Amer: >60 mL/min (ref >60)

## 2014-07-25 LAB — PSA: PSA: 3.24 ng/mL (ref 0.00–4.00)

## 2014-07-25 MED ORDER — LEUPROLIDE ACETATE (4 MONTH) 30 MG IM KIT
30.0000 mg | PACK | Freq: Once | INTRAMUSCULAR | Status: AC
  Administered 2014-07-25: 30 mg via INTRAMUSCULAR

## 2014-07-26 NOTE — Progress Notes (Signed)
[place]  [phone] [phone]     ID: Benjamin Villarreal OB: 1920-12-20  MR#: [id number]  WER#:[id number]  Patient Care Team: Venia Carbon, MD as PCP - General  CHIEF COMPLAINT/DIAGNOSIS:  Recurrent Prostate cancer, on intermittent androgen blockade with leuprolide injection (TxNxMx, Gleason score 2+3 = 5, s/p RRP in 1996 at [facility], subsequently had rising PSA and has been on leuprolide injections intermittently). Had been on Lupron break at least 4-5 years. January 2016 - serum PSA risen up to 7.9 and restarted on Lupron. Also started on IV Reclast January 2016 for significant osteoporosis.    HISTORY OF PRESENT ILLNESS:  Patient returns for continued oncology evaluation for management of prostate cancer as described above, he was restarted on Lupron injection in January 2016 due to rising PSA. Denies any new side effects from Lupron. Continues to eat well, denies unintentional weight loss. He has chronic arthritic pain and osteoporosis, no new bone pains. Denies any new compression fracture. Eating steady, ambulates slowly given advanced age and medical issues and uses walker but states that he tries to remain physically active. Denies any new abdominal or pelvic pain. No dysuria or hematuria. No other bleeding issues. No new mood disturbances. No new pain issues, 0/10.   REVIEW OF SYSTEMS:   ROS As in HPI above. In addition, no fever, chills. No new headaches or focal weakness.  No new sore throat, cough, shortness of breath, sputum, hemoptysis or chest pain. No abdominal pain, constipation, diarrhea, dysuria or hematuria. No new skin rash or bleeding symptoms. No new paresthesias in extremities. No polyuria or polydipsia. PS ECOG 1.    PAST MEDICAL HISTORY: Past Medical History  Diagnosis Date  . Osteoporosis     T -3.8 (01/2014)  . Arthritis   . Prostate cancer     surgery then lupron  . Colon polyps     adenomatous  . Other constipation    chronic  . Unspecified venous (peripheral) insufficiency   . Vitamin B12 deficiency           Recurrent Prostate cancer, on intermittent androgen blockade with leuprolide injections (TxNxMx, Gleason score    2+3 = 5, s/p RRP in 1996 at [facility], subsequently had rising PSA and has been on leuprolide inj intermittently)   Osteoporosis  Constipation  Osteoarthritis  Left knee replacement  Cataract surgery  Colonoscopy in August 2006, unremarkable per patient report  Left tibia fracture, status post surgery June 1999  Squamous cell skin cancer of left face status post Mohs  PAST SURGICAL HISTORY: Past Surgical History  Procedure Laterality Date  . Cataract extraction  2000, 2003    both eyes  . Basal cell carcinoma excision  2004    Left side of face  . Tibia fracture surgery  1999  . Prostate surgery  1996    cancer  . Volvulus reduction  12/10  . Joint replacement  2005    Left knee  . Joint replacement  12/13    Right knee    FAMILY HISTORY Family History  Problem Relation Age of Onset  . Diabetes Son   . Hypertension Paternal Grandfather   Negative for prostate cancer.  Remarkable for diabetes and leukemia.  ADVANCED DIRECTIVES:   SOCIAL HISTORY: History  Substance Use Topics  . Smoking status: Never Smoker   . Smokeless tobacco: Never Used  . Alcohol Use: 0.0 - 0.6 oz/week    0-1 Standard drinks or equivalent per week  Comment: in past  Denies smoking or alcohol use.  Lives with his wife at [facility] facility.  Allergies  Allergen Reactions  . Other     RAW ONIONS- SINUS REACTION    Current Outpatient Prescriptions  Medication Sig Dispense Refill  . aspirin 81 MG EC tablet Take 81 mg by mouth every other day.     . Calcium Carbonate-Vitamin D (CALCIUM-D) 600-400 MG-UNIT TABS Take 1 tablet by mouth daily.    . cholecalciferol (VITAMIN D) 1000 UNITS tablet Take 1,000 Units by mouth daily.      . ciclopirox (LOPROX) 0.77 % cream Apply topically 2 (two) times  daily.      . clindamycin (CLEOCIN) 150 MG capsule Take 150 mg by mouth. TAKES 1 HOUR PRIOR TO DENTAL WORK    . cyclobenzaprine (FLEXERIL) 5 MG tablet Take 1 tablet (5 mg total) by mouth at bedtime as needed. 90 tablet 3  . glucosamine-chondroitin 500-400 MG tablet Take 3 tablets by mouth daily.     Marland Kitchen leuprolide (LUPRON) 30 MG injection Inject 30 mg into the muscle every 4 (four) months. Dose and interval not clear yet    . loratadine (CLARITIN) 10 MG tablet TAKE 1 TABLET DAILY (Patient taking differently: TAKE 2 TABLETS DAILY) 90 tablet 2  . mineral oil-hydrophilic petrolatum (AQUAPHOR) ointment Apply 1 application topically. APPLY 1 APPLICATION ON LEFT BIG TOE DAILY    . polyethylene glycol powder (MIRALAX) powder Take 17 g by mouth 2 (two) times daily. (Patient taking differently: Take 17 g by mouth 3 (three) times daily. ) 527 g 3  . sennosides-docusate sodium (SENOKOT-S) 8.6-50 MG tablet Take 4 tablets by mouth daily.    . traMADol (ULTRAM) 50 MG tablet Take 1 tablet (50 mg total) by mouth 3 (three) times daily as needed. 270 tablet 0  . triamcinolone cream (KENALOG) 0.1 % Apply topically 2 (two) times daily.    . vitamin B-12 (CYANOCOBALAMIN) 1000 MCG tablet Take 1 tablet (1,000 mcg total) by mouth daily. 90 tablet 3  . vitamin C (ASCORBIC ACID) 500 MG tablet Take 500 mg by mouth daily.    . zoledronic acid (RECLAST) 5 MG/100ML SOLN injection Inject 5 mg into the vein once. ?yearly     No current facility-administered medications for this visit.   Facility-Administered Medications Ordered in Other Visits  Medication Dose Route Frequency Provider Last Rate Last Dose  . leuprolide (LUPRON) injection 30 mg  30 mg Intramuscular Once Leia Alf, MD        OBJECTIVE: Filed Vitals:   07/25/14 1029  BP: 137/74  Pulse: 64     Body mass index is 21.96 kg/(m^2).    ECOG FS:1 - Symptomatic but completely ambulatory  GENERAL: Patient is alert and oriented and in no acute distress. There is  no icterus. HEENT: EOMs intact. Oral exam negative for thrush or lesions. No cervical lymphadenopathy. CVS: S1S2, regular LUNGS: Bilaterally clear to auscultation, no rhonchi. ABDOMEN: Soft, nontender. No hepatomegaly clinically.  EXTREMITIES: No pedal edema.   LAB RESULTS:    Component Value Date/Time   NA 134* 02/02/2014 1707   NA 140 02/11/2012 0415   K 4.1 02/02/2014 1707   K 3.9 02/11/2012 0415   CL 105 02/02/2014 1707   CL 108* 02/11/2012 0415   CO2 22 02/02/2014 1707   CO2 25 02/11/2012 0415   GLUCOSE 93 02/02/2014 1707   GLUCOSE 125* 02/11/2012 0415   BUN 17 02/02/2014 1707   BUN 12 02/11/2012 0415  CREATININE 0.76 07/25/2014 0956   CREATININE 1.00 03/23/2014 1400   CALCIUM 8.3* 07/25/2014 0956   CALCIUM 8.3* 03/23/2014 1400   PROT 7.0 07/25/2014 0956   PROT 7.0 03/07/2014 1414   ALBUMIN 3.9 07/25/2014 0956   ALBUMIN 3.7 03/07/2014 1414   AST 25 07/25/2014 0956   AST 16 03/07/2014 1414   ALT 12* 07/25/2014 0956   ALT 19 03/07/2014 1414   ALKPHOS 47 07/25/2014 0956   ALKPHOS 69 03/07/2014 1414   BILITOT 0.4 07/25/2014 0956   GFRNONAA >60 07/25/2014 0956   GFRNONAA 49* 09/02/2013 1428   GFRAA >60 07/25/2014 0956   GFRAA 57* 09/02/2013 1428   Lab Results  Component Value Date   WBC 5.0 07/25/2014   NEUTROABS 2.8 07/25/2014   HGB 11.3* 07/25/2014   HCT 34.5* 07/25/2014   MCV 86.3 07/25/2014   PLT 141* 07/25/2014   07/25/14 - serum PSA is 3.24 (was 7.9 on 03/07/14, 5.4 on 12/05/13, 3.4 on 09/02/13, 2.2 in Jan 2015, was 1.9 in Oct 2014, 1.4 in July 2013, was 1.2 in april 2013,  was 0.7 in July 2012, 0.6 in Jan 2012, 0.7 in July 2011, 0.4 in July 2011, was 0.3 in July 2010,  0.2 in Jan 2010).  September 2009 - Hb 13, WBC 5700, platelets 152K, PSA 0.2, serum total testosterone 116.   STUDIES: 02/06/14 - DEXA scan. ASSESSMENT:   The BMD measured at Femur Neck Right is 0.578 g/cm2 with a T-score of -3.8 is markedly low. Fracture risk is high. Treatment, if not  already being done, should be started. A follow up DXA test is recommended in one year to monitor response to therapy. L- 3 and L-4 were excluded due to degenerative changes. Site Region Measured Measured WHO Adult BMD Date       Age      Classification T-score AP Spine L1-L2 4/06/2014 91.4 Osteoporosis -2.7 0.873 g/cm2 AP Spine L1-L2 03/29/2009 86.5 Osteoporosis -8.0 0.243 g/cm2 DualFemur Neck Right 02/06/2014 91.4 Osteoporosis -3.8 0.578 g/cm2 DualFemur Neck Right 03/29/2009 86.5 Osteoporosis -3.3 0.643 g/cm2.   ASSESSMENT / PLAN:   1.  Recurrent Prostate cancer, on intermittent androgen blockade with leuprolide injections (TxNxMx, Gleason score 2+3 = 5, s/p RRP in 1996 at [facility], subsequently had rising PSA and has been on leuprolide injections intermittently). Last MRI lumbar spine showing partial compression L3 fracture but no bone metastases reported. Patient is on intermittent androgen therapy given history of osteoporosis, has been off of Lupron for a few years now -  Labs from today reviewed and discussed with patient in detail. Serum PSA has dropped to 3.24 (was up to 7.9 in January 2016) after starting back on Lupron therapy in January. Given this, plan is to continue current treatment and he will receive next dose of Lupron 30 mg IM injection today. Will followup at 16 weeks with labs including PSA level and plan continued treatment.  2.  Osteoporosis -  got IV Reclast 5 mg (first dose) on 03/23/14, continue annually. He also is on calcium plus vitamin D. 3.  Anemia - Mild, no progressive anemia symptoms. Continue to monitor.  4.  Thrombocytopenia - Mild, no bleeding issues, continue to monitor. Low blood counts could be secondary to prostate cancer involving bones versus other etiology and will need further workup if it worsens in the future. 5. In between visits, patient advised to call or come to ER in case of any new symptoms or acute sickness. Patient is agreeable to this  plan.   Leia Alf, MD   07/26/2014 9:03 AM

## 2014-07-27 NOTE — Progress Notes (Signed)
Called patient and informed him that his PSA has dropped to 3.24 and that he should keep his previously scheduled appointments the same. I advised patient to call us back in between visits if he as any problems.

## 2014-08-07 DIAGNOSIS — H532 Diplopia: Secondary | ICD-10-CM | POA: Diagnosis not present

## 2014-10-11 ENCOUNTER — Ambulatory Visit: Payer: Medicare Other | Admitting: Podiatry

## 2014-10-12 DIAGNOSIS — L578 Other skin changes due to chronic exposure to nonionizing radiation: Secondary | ICD-10-CM | POA: Diagnosis not present

## 2014-10-12 DIAGNOSIS — B353 Tinea pedis: Secondary | ICD-10-CM | POA: Diagnosis not present

## 2014-10-12 DIAGNOSIS — I8310 Varicose veins of unspecified lower extremity with inflammation: Secondary | ICD-10-CM | POA: Diagnosis not present

## 2014-10-12 DIAGNOSIS — L309 Dermatitis, unspecified: Secondary | ICD-10-CM | POA: Diagnosis not present

## 2014-10-12 DIAGNOSIS — L853 Xerosis cutis: Secondary | ICD-10-CM | POA: Diagnosis not present

## 2014-10-12 DIAGNOSIS — L821 Other seborrheic keratosis: Secondary | ICD-10-CM | POA: Diagnosis not present

## 2014-10-12 DIAGNOSIS — Z85828 Personal history of other malignant neoplasm of skin: Secondary | ICD-10-CM | POA: Diagnosis not present

## 2014-10-17 ENCOUNTER — Ambulatory Visit (INDEPENDENT_AMBULATORY_CARE_PROVIDER_SITE_OTHER): Payer: Medicare Other | Admitting: Podiatry

## 2014-10-17 DIAGNOSIS — B353 Tinea pedis: Secondary | ICD-10-CM

## 2014-10-17 DIAGNOSIS — B351 Tinea unguium: Secondary | ICD-10-CM | POA: Diagnosis not present

## 2014-10-17 DIAGNOSIS — M79676 Pain in unspecified toe(s): Secondary | ICD-10-CM | POA: Diagnosis not present

## 2014-10-17 NOTE — Progress Notes (Signed)
Subjective: 81 y.o. returns the office today for painful, elongated, thickened toenails. Denies any redness or drainage around the nails. He continued to dermatologist are states they prescribed him some medicine for possible athlete's foot between his toes. He has not yet received the medication however. Denies any acute changes since last appointment and no new complaints today. Denies any systemic complaints such as fevers, chills, nausea, vomiting.   Objective: AAO 3, NAD DP/PT pulses palpable, CRT less than 3 seconds  Nails hypertrophic, dystrophic, elongated, brittle, discolored 10. There is tenderness overlying the nails 1-5 bilaterally. No open lesions or pre-ulcerative lesions are identified. There is a small amount of,dry, peeling skin interdigital a slight erythematous base consistent with tinea pedis.  No other areas of tenderness bilateral lower extremities. No overlying edema, erythema, increased warmth. No pain with calf compression, swelling, warmth, erythema.  Assessment: Patient presents with symptomatic onychomycosis; mild tinea pedis   Plan: -Treatment options including alternatives, risks, complications were discussed -Nails sharply debrided 10 without complication/bleeding. -Continue antifungal cream prescribed by dermatologist. -Discussed daily foot inspection. If there are any changes, to call the office immediately.  -Follow-up in 3 months or sooner if any problems are to arise. In the meantime, encouraged to call the office with any questions, concerns, changes symptoms.  Celesta Gentile, DPM

## 2014-10-19 ENCOUNTER — Encounter: Payer: Self-pay | Admitting: Internal Medicine

## 2014-10-19 ENCOUNTER — Non-Acute Institutional Stay: Payer: Medicare Other | Admitting: Internal Medicine

## 2014-10-19 VITALS — BP 118/66 | HR 62 | Temp 98.9°F | Resp 18 | Wt 171.0 lb

## 2014-10-19 DIAGNOSIS — K5909 Other constipation: Secondary | ICD-10-CM

## 2014-10-19 DIAGNOSIS — D696 Thrombocytopenia, unspecified: Secondary | ICD-10-CM

## 2014-10-19 DIAGNOSIS — I872 Venous insufficiency (chronic) (peripheral): Secondary | ICD-10-CM | POA: Diagnosis not present

## 2014-10-19 DIAGNOSIS — M150 Primary generalized (osteo)arthritis: Secondary | ICD-10-CM

## 2014-10-19 DIAGNOSIS — C61 Malignant neoplasm of prostate: Secondary | ICD-10-CM

## 2014-10-19 DIAGNOSIS — M159 Polyosteoarthritis, unspecified: Secondary | ICD-10-CM

## 2014-10-19 DIAGNOSIS — D638 Anemia in other chronic diseases classified elsewhere: Secondary | ICD-10-CM

## 2014-10-19 DIAGNOSIS — G463 Brain stem stroke syndrome: Secondary | ICD-10-CM | POA: Diagnosis not present

## 2014-10-19 NOTE — Assessment & Plan Note (Signed)
No bleeding issues.

## 2014-10-19 NOTE — Assessment & Plan Note (Signed)
He has responded with lower PSA with the lupron Continues with Dr Ma Hillock

## 2014-10-19 NOTE — Assessment & Plan Note (Signed)
Better now with just the compression socks

## 2014-10-19 NOTE — Assessment & Plan Note (Signed)
Hemoglobin 11.3 No symptoms Dr Ma Hillock following labs

## 2014-10-19 NOTE — Assessment & Plan Note (Signed)
Fine with current regimen

## 2014-10-19 NOTE — Assessment & Plan Note (Signed)
Vertigo and balance are closed to back to normal

## 2014-10-19 NOTE — Progress Notes (Signed)
Subjective:    Patient ID: Benjamin Villarreal, male    DOB: [birth year], 81 y.o.   MRN: [id number]  HPI Visit for follow up of chonic medical problems Reviewed status with [facility] RN  Had brief spell of chest pain about a week ago Lasted less than 30 minutes Seemed to improve when he moved--and worse with sitting No dyspnea with this He felt it muscular (had occurred after getting up from sleep)  Ongoing post nasal drip Gets congested in throat at times No heartburn  Did get lupron shot in May Has responded with decrease in PSA Tolerating shots Bones are an issue--but doesn't seem to have mets Goes back next month  Bowels are fine miralax daily --- 2.5 capfuls daily and sennakot Usually has a good BM after breakfast Occasionally a second one  Energy levels are okay No abnormal bleeding  No recent vertigo Balance has been better  Current Outpatient Prescriptions on File Prior to Visit  Medication Sig Dispense Refill  . aspirin 81 MG EC tablet Take 81 mg by mouth every other day.     . Calcium Carbonate-Vitamin D (CALCIUM-D) 600-400 MG-UNIT TABS Take 1 tablet by mouth daily.    . cholecalciferol (VITAMIN D) 1000 UNITS tablet Take 1,000 Units by mouth daily.      . ciclopirox (LOPROX) 0.77 % cream Apply topically 2 (two) times daily.      . clindamycin (CLEOCIN) 150 MG capsule Take 150 mg by mouth. TAKES 1 HOUR PRIOR TO DENTAL WORK    . cyclobenzaprine (FLEXERIL) 5 MG tablet Take 1 tablet (5 mg total) by mouth at bedtime as needed. 90 tablet 3  . glucosamine-chondroitin 500-400 MG tablet Take 3 tablets by mouth daily.     Marland Kitchen leuprolide (LUPRON) 30 MG injection Inject 30 mg into the muscle every 4 (four) months. Dose and interval not clear yet    . loratadine (CLARITIN) 10 MG tablet TAKE 1 TABLET DAILY (Patient taking differently: TAKE 2 TABLETS DAILY) 90 tablet 2  . mineral oil-hydrophilic petrolatum (AQUAPHOR) ointment Apply 1 application topically. APPLY 1 APPLICATION ON LEFT BIG  TOE DAILY    . polyethylene glycol powder (MIRALAX) powder Take 17 g by mouth 2 (two) times daily. (Patient taking differently: Take 17 g by mouth 2 (two) times daily. And 8.5gm at noon) 527 g 3  . sennosides-docusate sodium (SENOKOT-S) 8.6-50 MG tablet Take 4 tablets by mouth daily.    . traMADol (ULTRAM) 50 MG tablet Take 1 tablet (50 mg total) by mouth 3 (three) times daily as needed. 270 tablet 0  . triamcinolone cream (KENALOG) 0.1 % Apply topically 2 (two) times daily.    . vitamin B-12 (CYANOCOBALAMIN) 1000 MCG tablet Take 1 tablet (1,000 mcg total) by mouth daily. 90 tablet 3  . vitamin C (ASCORBIC ACID) 500 MG tablet Take 500 mg by mouth daily.    . zoledronic acid (RECLAST) 5 MG/100ML SOLN injection Inject 5 mg into the vein once. ?yearly     Current Facility-Administered Medications on File Prior to Visit  Medication Dose Route Frequency Provider Last Rate Last Dose  . leuprolide (LUPRON) injection 30 mg  30 mg Intramuscular Once Leia Alf, MD        Allergies  Allergen Reactions  . Other     RAW ONIONS- SINUS REACTION    Past Medical History  Diagnosis Date  . Osteoporosis     T -3.8 (01/2014)  . Arthritis   . Prostate cancer  surgery then lupron  . Colon polyps     adenomatous  . Other constipation     chronic  . Unspecified venous (peripheral) insufficiency   . Vitamin B12 deficiency     Past Surgical History  Procedure Laterality Date  . Cataract extraction  2000, 2003    both eyes  . Basal cell carcinoma excision  2004    Left side of face  . Tibia fracture surgery  1999  . Prostate surgery  1996    cancer  . Volvulus reduction  12/10  . Joint replacement  2005    Left knee  . Joint replacement  12/13    Right knee    Family History  Problem Relation Age of Onset  . Diabetes Son   . Hypertension Paternal Grandfather     Social History   Social History  . Marital Status: Married    Spouse Name: [name]  . Number of Children: 3  .  Years of Education: N/A   Occupational History  . [employer]     retired   Social History Main Topics  . Smoking status: Never Smoker   . Smokeless tobacco: Never Used  . Alcohol Use: 0.0 - 0.6 oz/week    0-1 Standard drinks or equivalent per week     Comment: in past  . Drug Use: Not on file  . Sexual Activity: Not on file   Other Topics Concern  . Not on file   Social History Narrative   Has living will   Daughter [name] holds health care POA.   Has DNR and we have reviewed this.   Would not want tube feedings if cognitively unaware   Review of Systems  Appetite is good Weight stable Sleeps well Voids fine Has seen a dermatologist--dry skin and itching in arms.  Still some stasis changes but edema has been controlled with hose     Objective:   Physical Exam  Constitutional: He appears well-developed and well-nourished. No distress.  Neck: Normal range of motion. Neck supple. No thyromegaly present.  Cardiovascular: Normal rate and regular rhythm.  Exam reveals no gallop.   Very faint systolic murmur in mitral area  Pulmonary/Chest: Effort normal and breath sounds normal. No respiratory distress. He has no wheezes. He has no rales.  Abdominal: Soft. There is no tenderness.  Musculoskeletal: He exhibits no edema.  Lymphadenopathy:    He has no cervical adenopathy.  Psychiatric: He has a normal mood and affect. His behavior is normal.          Assessment & Plan:

## 2014-10-19 NOTE — Assessment & Plan Note (Signed)
This is better since the surgery Hasn't needed the tramadol Tries to walk daily and uses the machines as well

## 2014-11-14 ENCOUNTER — Other Ambulatory Visit: Payer: Medicare Other

## 2014-11-14 ENCOUNTER — Ambulatory Visit: Payer: Medicare Other | Admitting: Internal Medicine

## 2014-11-14 ENCOUNTER — Ambulatory Visit: Payer: Medicare Other

## 2014-11-20 ENCOUNTER — Inpatient Hospital Stay: Payer: Medicare Other

## 2014-11-20 ENCOUNTER — Inpatient Hospital Stay: Payer: Medicare Other | Attending: Internal Medicine

## 2014-11-20 ENCOUNTER — Inpatient Hospital Stay (HOSPITAL_BASED_OUTPATIENT_CLINIC_OR_DEPARTMENT_OTHER): Payer: Medicare Other | Admitting: Internal Medicine

## 2014-11-20 VITALS — BP 130/73 | HR 64 | Temp 97.1°F | Resp 18 | Ht 74.0 in | Wt 170.0 lb

## 2014-11-20 DIAGNOSIS — Z79899 Other long term (current) drug therapy: Secondary | ICD-10-CM | POA: Insufficient documentation

## 2014-11-20 DIAGNOSIS — Z85828 Personal history of other malignant neoplasm of skin: Secondary | ICD-10-CM | POA: Diagnosis not present

## 2014-11-20 DIAGNOSIS — D649 Anemia, unspecified: Secondary | ICD-10-CM | POA: Diagnosis not present

## 2014-11-20 DIAGNOSIS — M818 Other osteoporosis without current pathological fracture: Secondary | ICD-10-CM

## 2014-11-20 DIAGNOSIS — D696 Thrombocytopenia, unspecified: Secondary | ICD-10-CM

## 2014-11-20 DIAGNOSIS — Z5111 Encounter for antineoplastic chemotherapy: Secondary | ICD-10-CM | POA: Insufficient documentation

## 2014-11-20 DIAGNOSIS — M129 Arthropathy, unspecified: Secondary | ICD-10-CM

## 2014-11-20 DIAGNOSIS — Z7982 Long term (current) use of aspirin: Secondary | ICD-10-CM | POA: Insufficient documentation

## 2014-11-20 DIAGNOSIS — Z8781 Personal history of (healed) traumatic fracture: Secondary | ICD-10-CM | POA: Insufficient documentation

## 2014-11-20 DIAGNOSIS — E58 Dietary calcium deficiency: Secondary | ICD-10-CM

## 2014-11-20 DIAGNOSIS — Z86010 Personal history of colonic polyps: Secondary | ICD-10-CM | POA: Diagnosis not present

## 2014-11-20 DIAGNOSIS — K5900 Constipation, unspecified: Secondary | ICD-10-CM | POA: Diagnosis not present

## 2014-11-20 DIAGNOSIS — C61 Malignant neoplasm of prostate: Secondary | ICD-10-CM | POA: Diagnosis not present

## 2014-11-20 DIAGNOSIS — I872 Venous insufficiency (chronic) (peripheral): Secondary | ICD-10-CM

## 2014-11-20 DIAGNOSIS — E538 Deficiency of other specified B group vitamins: Secondary | ICD-10-CM | POA: Diagnosis not present

## 2014-11-20 LAB — CBC WITH DIFFERENTIAL/PLATELET
Basophils Absolute: 0.1 10*3/uL (ref 0–0.1)
Basophils Relative: 2 %
EOS ABS: 0.2 10*3/uL (ref 0–0.7)
EOS PCT: 6 %
HCT: 35.4 % — ABNORMAL LOW (ref 40.0–52.0)
Hemoglobin: 11.6 g/dL — ABNORMAL LOW (ref 13.0–18.0)
LYMPHS PCT: 35 %
Lymphs Abs: 1.5 10*3/uL (ref 1.0–3.6)
MCH: 27.6 pg (ref 26.0–34.0)
MCHC: 32.9 g/dL (ref 32.0–36.0)
MCV: 84.1 fL (ref 80.0–100.0)
MONO ABS: 0.5 10*3/uL (ref 0.2–1.0)
MONOS PCT: 11 %
Neutro Abs: 1.9 10*3/uL (ref 1.4–6.5)
Neutrophils Relative %: 46 %
Platelets: 139 10*3/uL — ABNORMAL LOW (ref 150–440)
RBC: 4.21 MIL/uL — ABNORMAL LOW (ref 4.40–5.90)
RDW: 14.9 % — ABNORMAL HIGH (ref 11.5–14.5)
WBC: 4.2 10*3/uL (ref 3.8–10.6)

## 2014-11-20 LAB — CREATININE, SERUM
CREATININE: 1.02 mg/dL (ref 0.61–1.24)
GFR calc non Af Amer: >60 mL/min (ref >60)

## 2014-11-20 LAB — HEPATIC FUNCTION PANEL
ALT: 14 U/L — ABNORMAL LOW (ref 17–63)
AST: 26 U/L (ref 15–41)
Albumin: 3.9 g/dL (ref 3.5–5.0)
Alkaline Phosphatase: 52 U/L (ref 38–126)
Bilirubin, Direct: <0.1 mg/dL — ABNORMAL LOW (ref 0.1–0.5)
Total Bilirubin: 0.3 mg/dL (ref 0.3–1.2)
Total Protein: 7.3 g/dL (ref 6.5–8.1)

## 2014-11-20 LAB — PSA: PSA: 3.33 ng/mL (ref 0.00–4.00)

## 2014-11-20 MED ORDER — LEUPROLIDE ACETATE (4 MONTH) 30 MG IM KIT
30.0000 mg | PACK | Freq: Once | INTRAMUSCULAR | Status: AC
Start: 2014-11-20 — End: 2014-11-20
  Administered 2014-11-20: 30 mg via INTRAMUSCULAR
  Filled 2014-11-20: qty 30

## 2014-11-20 NOTE — Progress Notes (Signed)
Patient is here for follow-up of prostate cancer and lupron injection. Patient states that he has had a runny nose and sore throat for about a day. He states that his sore throat has pretty much resolved. He denies any pain today.

## 2014-12-02 NOTE — Progress Notes (Signed)
Benjamin Villarreal  [phone] [phone]     ID: Olin Pia OB: Dec 17, 1920  MR#: [id number]  LXB#:[id number]  Patient Care Team: Venia Carbon, MD as PCP - General  CHIEF COMPLAINT/DIAGNOSIS:  Recurrent Prostate cancer, on intermittent androgen blockade with leuprolide injection (TxNxMx, Gleason score 2+3 = 5, s/p RRP in 1996 at [facility], subsequently had rising PSA and has been on leuprolide injections intermittently). Had been on Lupron break at least 4-5 years. January 2016 - serum PSA risen up to 7.9 and restarted on Lupron. Also started on IV Reclast January 2016 for significant osteoporosis.    HISTORY OF PRESENT ILLNESS:  Patient returns for continued oncology follow-up for prostate cancer as described above, he was seen in end of May. He was restarted on Lupron injection in January 2016 due to rising PSA. Denies any new side effects from Lupron. States that appetite and weight is steady. He has chronic arthritic pain and osteoporosis, no new bone pains. Denies any new compression fracture. Ambulates slowly given advanced age and medical issues and uses walker but states that he tries to remain physically active. Denies any new abdominal or pelvic pain. No dysuria or hematuria. No other bleeding issues. No new mood disturbances.     REVIEW OF SYSTEMS:   ROS As in HPI above. In addition, no fevers or chills. No new headaches or focal weakness.  No new sore throat, cough, shortness of breath, sputum, hemoptysis or chest pain. No abdominal pain, constipation, diarrhea, dysuria or hematuria. No new skin rash or bleeding symptoms. No new paresthesias in extremities. No polyuria or polydipsia. PS ECOG 1.   PAST MEDICAL HISTORY: Past Medical History  Diagnosis Date  . Osteoporosis     T -3.8 (01/2014)  . Arthritis   . Prostate cancer     surgery then lupron  . Colon polyps     adenomatous  . Other constipation     chronic  . Unspecified venous  (peripheral) insufficiency   . Vitamin B12 deficiency           Recurrent Prostate cancer, on intermittent androgen blockade with leuprolide injections (TxNxMx, Gleason score    2+3 = 5, s/p RRP in 1996 at [facility], subsequently had rising PSA and has been on leuprolide inj intermittently)   Osteoporosis  Constipation  Osteoarthritis  Left knee replacement  Cataract surgery  Colonoscopy in August 2006, unremarkable per patient report  Left tibia fracture, status post surgery June 1999  Squamous cell skin cancer of left face status post Mohs  PAST SURGICAL HISTORY: Past Surgical History  Procedure Laterality Date  . Cataract extraction  2000, 2003    both eyes  . Basal cell carcinoma excision  2004    Left side of face  . Tibia fracture surgery  1999  . Prostate surgery  1996    cancer  . Volvulus reduction  12/10  . Joint replacement  2005    Left knee  . Joint replacement  12/13    Right knee    FAMILY HISTORY Family History  Problem Relation Age of Onset  . Diabetes Son   . Hypertension Paternal Grandfather   Negative for prostate cancer.  Remarkable for diabetes and leukemia.  SOCIAL HISTORY: Social History  Substance Use Topics  . Smoking status: Never Smoker   . Smokeless tobacco: Never Used  . Alcohol Use: 0.0 - 0.6 oz/week    0-1 Standard drinks or equivalent per week     Comment: in  past  Denies smoking or alcohol use.  Lives with his wife at [facility] facility.  Allergies  Allergen Reactions  . Other     RAW ONIONS- SINUS REACTION    Current Outpatient Prescriptions  Medication Sig Dispense Refill  . aspirin 81 MG EC tablet Take 81 mg by mouth every other day.     . Calcium Carbonate-Vitamin D (CALCIUM-D) 600-400 MG-UNIT TABS Take 1 tablet by mouth daily.    . cholecalciferol (VITAMIN D) 1000 UNITS tablet Take 1,000 Units by mouth daily.      . ciclopirox (LOPROX) 0.77 % cream Apply topically 2 (two) times daily.      . clindamycin (CLEOCIN) 150 MG  capsule Take 150 mg by mouth. TAKES 1 HOUR PRIOR TO DENTAL WORK    . cyclobenzaprine (FLEXERIL) 5 MG tablet Take 1 tablet (5 mg total) by mouth at bedtime as needed. 90 tablet 3  . glucosamine-chondroitin 500-400 MG tablet Take 3 tablets by mouth daily.     Marland Kitchen leuprolide (LUPRON) 30 MG injection Inject 30 mg into the muscle every 4 (four) months. Dose and interval not clear yet    . loratadine (CLARITIN) 10 MG tablet TAKE 1 TABLET DAILY (Patient taking differently: TAKE 2 TABLETS DAILY) 90 tablet 2  . mineral oil-hydrophilic petrolatum (AQUAPHOR) ointment Apply 1 application topically. APPLY 1 APPLICATION ON LEFT BIG TOE DAILY    . polyethylene glycol powder (MIRALAX) powder Take 17 g by mouth 2 (two) times daily. (Patient taking differently: Take 17 g by mouth 2 (two) times daily. And 8.5gm at noon) 527 g 3  . sennosides-docusate sodium (SENOKOT-S) 8.6-50 MG tablet Take 4 tablets by mouth daily.    . traMADol (ULTRAM) 50 MG tablet Take 1 tablet (50 mg total) by mouth 3 (three) times daily as needed. 270 tablet 0  . triamcinolone cream (KENALOG) 0.1 % Apply topically 2 (two) times daily.    . vitamin B-12 (CYANOCOBALAMIN) 1000 MCG tablet Take 1 tablet (1,000 mcg total) by mouth daily. 90 tablet 3  . vitamin C (ASCORBIC ACID) 500 MG tablet Take 500 mg by mouth daily.    . zoledronic acid (RECLAST) 5 MG/100ML SOLN injection Inject 5 mg into the vein once. ?yearly     No current facility-administered medications for this visit.   Facility-Administered Medications Ordered in Other Visits  Medication Dose Route Frequency Provider Last Rate Last Dose  . leuprolide (LUPRON) injection 30 mg  30 mg Intramuscular Once Leia Alf, MD        OBJECTIVE: Filed Vitals:   11/20/14 1120  BP: 130/73  Pulse: 64  Temp: 97.1 F (36.2 C)  Resp: 18     Body mass index is 21.81 kg/(m^2).    ECOG FS:1 - Symptomatic but completely ambulatory  GENERAL: Alert and oriented and in no acute distress. No  icterus. HEENT: EOMs intact. No cervical lymphadenopathy. CVS: S1S2, regular LUNGS: Bilaterally clear to auscultation, no rhonchi. ABDOMEN: Soft, nontender. No hepatomegaly clinically.  EXTREMITIES: No pedal edema.   LAB RESULTS:    Component Value Date/Time   NA 134* 02/02/2014 1707   NA 140 02/11/2012 0415   K 4.1 02/02/2014 1707   K 3.9 02/11/2012 0415   CL 105 02/02/2014 1707   CL 108* 02/11/2012 0415   CO2 22 02/02/2014 1707   CO2 25 02/11/2012 0415   GLUCOSE 93 02/02/2014 1707   GLUCOSE 125* 02/11/2012 0415   BUN 17 02/02/2014 1707   BUN 12 02/11/2012 0415  CREATININE 1.02 11/20/2014 1035   CREATININE 1.00 03/23/2014 1400   CALCIUM 8.3* 07/25/2014 0956   CALCIUM 8.3* 03/23/2014 1400   PROT 7.3 11/20/2014 1035   PROT 7.0 03/07/2014 1414   ALBUMIN 3.9 11/20/2014 1035   ALBUMIN 3.7 03/07/2014 1414   AST 26 11/20/2014 1035   AST 16 03/07/2014 1414   ALT 14* 11/20/2014 1035   ALT 19 03/07/2014 1414   ALKPHOS 52 11/20/2014 1035   ALKPHOS 69 03/07/2014 1414   BILITOT 0.3 11/20/2014 1035   BILITOT 0.3 03/07/2014 1414   GFRNONAA >60 11/20/2014 1035   GFRNONAA >60 03/23/2014 1400   GFRNONAA 49* 09/02/2013 1428   GFRAA >60 11/20/2014 1035   GFRAA >60 03/23/2014 1400   GFRAA 57* 09/02/2013 1428   Lab Results  Component Value Date   WBC 4.2 11/20/2014   NEUTROABS 1.9 11/20/2014   HGB 11.6* 11/20/2014   HCT 35.4* 11/20/2014   MCV 84.1 11/20/2014   PLT 139* 11/20/2014   11/20/14 - serum PSA is 3.33  (was 3.24 on 07/25/14, 7.9 on 03/07/14, 5.4 on 12/05/13, 3.4 on 09/02/13, 2.2 in Jan 2015, was 1.9 in Oct 2014, 1.4 in July 2013, was 1.2 in april 2013,  was 0.7 in July 2012, 0.6 in Jan 2012, 0.7 in July 2011, 0.4 in July 2011, was 0.3 in July 2010,  0.2 in Jan 2010).  September 2009 - Hb 13, WBC 5700, platelets 152K, PSA 0.2, serum total testosterone 116.  STUDIES: 02/06/14 - DEXA scan. ASSESSMENT:   The BMD measured at Femur Neck Right is 0.578 g/cm2 with a T-score of  -3.8 is markedly low. Fracture risk is high. Treatment, if not already being done, should be started. A follow up DXA test is recommended in one year to monitor response to therapy. L- 3 and L-4 were excluded due to degenerative changes. Site Region Measured Measured WHO Adult BMD Date       Age      Classification T-score AP Spine L1-L2 4/06/2014 91.4 Osteoporosis -2.7 0.873 g/cm2 AP Spine L1-L2 03/29/2009 86.5 Osteoporosis -8.0 0.243 g/cm2 DualFemur Neck Right 02/06/2014 91.4 Osteoporosis -3.8 0.578 g/cm2 DualFemur Neck Right 5/29/2009 86.5 Osteoporosis -3.3 0.643 g/cm2.   ASSESSMENT / PLAN:   1.  Recurrent Prostate cancer, on intermittent androgen blockade with leuprolide injections (TxNxMx, Gleason score 2+3 = 5, s/p RRP in 1996 at [facility], subsequently had rising PSA and has been on leuprolide injections intermittently). Last MRI lumbar spine showing partial compression L3 fracture but no bone metastases reported. Patient is on intermittent androgen therapy given history of osteoporosis, has been off of Lupron for a few years now -  reviewed labs from today are discussed with patient. Clinically doing steady, no new or progressive bone pains. Serum PSA remains steady at 3.33  (was up to 7.9 in January 2016) after starting back on Lupron therapy in January. Given this, plan is to continue current treatment and he will receive next dose of Lupron 30 mg IM injection today. Will followup at 16 weeks with labs including PSA level and plan continued treatment.  2.  Osteoporosis -  got IV Reclast 5 mg (first dose) on 03/23/14, continue annually. He also is on calcium plus vitamin D. Next dose planned for 03/26/15. 3.  Anemia - Mild, no progressive anemia symptoms. Continue to monitor.  4.  Thrombocytopenia - Mild, no bleeding issues, continue to monitor. Low blood counts could be secondary to prostate cancer involving bones versus other etiology and will need further  workup if it worsens in the future. 5.  In between visits, patient advised to call or come to ER in case of any new symptoms or acute sickness. Patient is agreeable to this plan.   Leia Alf, MD   12/02/2014 9:48 PM

## 2014-12-19 ENCOUNTER — Other Ambulatory Visit: Payer: Self-pay | Admitting: *Deleted

## 2014-12-19 MED ORDER — POLYETHYLENE GLYCOL 3350 17 GM/SCOOP PO POWD: 17.0000 g | Freq: Two times a day (BID) | ORAL | Status: DC

## 2014-12-25 DIAGNOSIS — Z23 Encounter for immunization: Secondary | ICD-10-CM | POA: Diagnosis not present

## 2015-01-16 ENCOUNTER — Ambulatory Visit (INDEPENDENT_AMBULATORY_CARE_PROVIDER_SITE_OTHER): Payer: Medicare Other | Admitting: Sports Medicine

## 2015-01-16 ENCOUNTER — Encounter: Payer: Self-pay | Admitting: Sports Medicine

## 2015-01-16 DIAGNOSIS — M79676 Pain in unspecified toe(s): Secondary | ICD-10-CM

## 2015-01-16 DIAGNOSIS — B351 Tinea unguium: Secondary | ICD-10-CM | POA: Diagnosis not present

## 2015-01-16 DIAGNOSIS — I739 Peripheral vascular disease, unspecified: Secondary | ICD-10-CM

## 2015-01-16 DIAGNOSIS — B353 Tinea pedis: Secondary | ICD-10-CM

## 2015-01-16 NOTE — Progress Notes (Signed)
Patient ID: Benjamin Villarreal, male   DOB: 03-09-1920, 81 y.o.   MRN: [id number] Subjective: Benjamin Villarreal is a 81 y.o. male patient seen today in office with complaint of painful thickened and elongated toenails; unable to trim. Has been using triamcinolone cream and Eucerin with improvement in tinea especially at ankles. Patient denies history of Diabetes or Neuropathy. Patient has no other pedal complaints at this time.   Patient Active Problem List   Diagnosis Date Noted  . Anemia of chronic disease 10/19/2014  . Thrombocytopenia ([place]) 07/13/2014  . Tachycardia 02/02/2014  . 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 Allergic rhinitis due to pollen 10/21/2012  . VITAMIN B12 DEFICIENCY 12/13/2009  . ADENOCARCINOMA, PROSTATE 03/22/2009  . Chronic venous insufficiency 09/07/2008  . CONSTIPATION, CHRONIC 09/07/2008  . Osteoarthritis, multiple sites 09/05/2008  . Osteoporosis 09/05/2008   Current Outpatient Prescriptions on File Prior to Visit  Medication Sig Dispense Refill  . aspirin 81 MG EC tablet Take 81 mg by mouth every other day.     . Calcium Carbonate-Vitamin D (CALCIUM-D) 600-400 MG-UNIT TABS Take 1 tablet by mouth daily.    . cholecalciferol (VITAMIN D) 1000 UNITS tablet Take 1,000 Units by mouth daily.      . ciclopirox (LOPROX) 0.77 % cream Apply topically 2 (two) times daily.      . clindamycin (CLEOCIN) 150 MG capsule Take 150 mg by mouth. TAKES 1 HOUR PRIOR TO DENTAL WORK    . cyclobenzaprine (FLEXERIL) 5 MG tablet Take 1 tablet (5 mg total) by mouth at bedtime as needed. 90 tablet 3  . glucosamine-chondroitin 500-400 MG tablet Take 3 tablets by mouth daily.     Marland Kitchen leuprolide (LUPRON) 30 MG injection Inject 30 mg into the muscle every 4 (four) months. Dose and interval not clear yet    . loratadine (CLARITIN) 10 MG tablet TAKE 1 TABLET DAILY (Patient taking differently: TAKE 2 TABLETS DAILY) 90 tablet 2  . mineral oil-hydrophilic petrolatum (AQUAPHOR) ointment Apply 1 application  topically. APPLY 1 APPLICATION ON LEFT BIG TOE DAILY    . polyethylene glycol powder (MIRALAX) powder Take 17 g by mouth 2 (two) times daily. And 8.5gm at noon 527 g 3  . sennosides-docusate sodium (SENOKOT-S) 8.6-50 MG tablet Take 4 tablets by mouth daily.    . traMADol (ULTRAM) 50 MG tablet Take 1 tablet (50 mg total) by mouth 3 (three) times daily as needed. 270 tablet 0  . triamcinolone cream (KENALOG) 0.1 % Apply topically 2 (two) times daily.    . vitamin B-12 (CYANOCOBALAMIN) 1000 MCG tablet Take 1 tablet (1,000 mcg total) by mouth daily. 90 tablet 3  . vitamin C (ASCORBIC ACID) 500 MG tablet Take 500 mg by mouth daily.    . zoledronic acid (RECLAST) 5 MG/100ML SOLN injection Inject 5 mg into the vein once. ?yearly     Current Facility-Administered Medications on File Prior to Visit  Medication Dose Route Frequency Provider Last Rate Last Dose  . leuprolide (LUPRON) injection 30 mg  30 mg Intramuscular Once Leia Alf, MD       Allergies  Allergen Reactions  . Other     RAW ONIONS- SINUS REACTION       Objective: Physical Exam  General: Well developed, nourished, no acute distress, awake, alert and oriented x 3. Rolling walker assisted gait.   Vascular: Dorsalis pedis artery 1/4 bilateral, Posterior tibial artery 1/4 bilateral, skin temperature warm to warm proximal to distal bilateral lower extremities, + varicosities and hemosiderin hyperpigmentation,  no edema, no pedal hair present bilateral.  Neurological: Gross sensation present via light touch bilateral.   Dermatological: Skin is warm, dry, and supple bilateral, Nails 1-10 are tender, long, thick, and discolored with mild subungal debris, no webspace macerations present bilateral, no open lesions present bilateral, no signs of tinea, no callus/corns/hyperkeratotic tissue present bilateral. No signs of infection bilateral.  Musculoskeletal: No gross boney deformities noted bilateral. Muscular strength within normal  limits without pain or limitation on range of motion. No pain with calf compression bilateral.  Assessment and Plan:  Problem List Items Addressed This Visit    None    Visit Diagnoses    Dermatophytosis of nail    -  Primary    Tinea pedis of both feet        Improving    PVD (peripheral vascular disease) ([place])        Pain of toe, unspecified laterality          -Examined patient.  -Discussed treatment options for painful mycotic nails. -Mechanically debrided and reduced mycotic nails with sterile nail nipper and dremel nail file without incident. -Cont with Triamcinolone and Eucerin as Rx -Recommend good supportive shoes daily and ambulation with assistance of rolling walker for stability.  -Patient to return in 3 months for follow up evaluation or sooner if symptoms worsen.  Landis Martins, DPM

## 2015-02-07 DIAGNOSIS — H532 Diplopia: Secondary | ICD-10-CM | POA: Diagnosis not present

## 2015-02-08 ENCOUNTER — Non-Acute Institutional Stay: Payer: Medicare Other | Admitting: Internal Medicine

## 2015-02-08 ENCOUNTER — Encounter: Payer: Self-pay | Admitting: Internal Medicine

## 2015-02-08 VITALS — BP 124/76 | HR 72 | Temp 98.5°F | Resp 16 | Wt 176.0 lb

## 2015-02-08 DIAGNOSIS — K5909 Other constipation: Secondary | ICD-10-CM | POA: Diagnosis not present

## 2015-02-08 DIAGNOSIS — S46919A Strain of unspecified muscle, fascia and tendon at shoulder and upper arm level, unspecified arm, initial encounter: Secondary | ICD-10-CM | POA: Diagnosis not present

## 2015-02-08 DIAGNOSIS — C61 Malignant neoplasm of prostate: Secondary | ICD-10-CM

## 2015-02-08 DIAGNOSIS — J301 Allergic rhinitis due to pollen: Secondary | ICD-10-CM | POA: Diagnosis not present

## 2015-02-08 DIAGNOSIS — I872 Venous insufficiency (chronic) (peripheral): Secondary | ICD-10-CM | POA: Diagnosis not present

## 2015-02-08 DIAGNOSIS — D696 Thrombocytopenia, unspecified: Secondary | ICD-10-CM

## 2015-02-08 NOTE — Progress Notes (Signed)
Subjective:    Patient ID: Benjamin Villarreal, male    DOB: 1920/06/10, 81 y.o.   MRN: [id number]  HPI AL visit for follow up of chronic medical conditions Reviewed status with Leafy Ro RN  He feels well today Continues with oncologist and lupron injections Will get reclast again next month No apparent bony pain Voiding okay  Went to [employer] last weekend --son's home Able to go up and down stairs holding on--has felt pain in shoulders and upper arms since then Usually tries to do upper and lower body strength work in gym here Notices discomfort more when using arms-- like if dressing Hasn't tried anything--has some tramadol but hasn't used it (has Rx from 2014)  Leg swelling controlled with support hose No SOB Has occasional chest pain in various spots---usually in bed and improves with change in position  Bowels are regular after breakfast daily miralax 2.5 doses daily and 2 senna regularly are working  Current Outpatient Prescriptions on File Prior to Visit  Medication Sig Dispense Refill  . aspirin 81 MG EC tablet Take 81 mg by mouth every other day.     . Calcium Carbonate-Vitamin D (CALCIUM-D) 600-400 MG-UNIT TABS Take 1 tablet by mouth daily.    . cholecalciferol (VITAMIN D) 1000 UNITS tablet Take 1,000 Units by mouth daily.      . ciclopirox (LOPROX) 0.77 % cream Apply topically 2 (two) times daily.      . clindamycin (CLEOCIN) 150 MG capsule Take 150 mg by mouth. TAKES 1 HOUR PRIOR TO DENTAL WORK    . cyclobenzaprine (FLEXERIL) 5 MG tablet Take 1 tablet (5 mg total) by mouth at bedtime as needed. 90 tablet 3  . glucosamine-chondroitin 500-400 MG tablet Take 3 tablets by mouth daily.     Marland Kitchen leuprolide (LUPRON) 30 MG injection Inject 30 mg into the muscle every 4 (four) months. Dose and interval not clear yet    . loratadine (CLARITIN) 10 MG tablet TAKE 1 TABLET DAILY (Patient taking differently: TAKE 2 TABLETS DAILY) 90 tablet 2  . mineral oil-hydrophilic petrolatum (AQUAPHOR)  ointment Apply 1 application topically. APPLY 1 APPLICATION ON LEFT BIG TOE DAILY    . polyethylene glycol powder (MIRALAX) powder Take 17 g by mouth 2 (two) times daily. And 8.5gm at noon 527 g 3  . sennosides-docusate sodium (SENOKOT-S) 8.6-50 MG tablet Take 4 tablets by mouth daily.    . traMADol (ULTRAM) 50 MG tablet Take 1 tablet (50 mg total) by mouth 3 (three) times daily as needed. 270 tablet 0  . triamcinolone cream (KENALOG) 0.1 % Apply topically 2 (two) times daily.    . vitamin B-12 (CYANOCOBALAMIN) 1000 MCG tablet Take 1 tablet (1,000 mcg total) by mouth daily. 90 tablet 3  . vitamin C (ASCORBIC ACID) 500 MG tablet Take 500 mg by mouth daily.    . zoledronic acid (RECLAST) 5 MG/100ML SOLN injection Inject 5 mg into the vein once. ?yearly     Current Facility-Administered Medications on File Prior to Visit  Medication Dose Route Frequency Provider Last Rate Last Dose  . leuprolide (LUPRON) injection 30 mg  30 mg Intramuscular Once Leia Alf, MD        Allergies  Allergen Reactions  . Other     RAW ONIONS- SINUS REACTION    Past Medical History  Diagnosis Date  . Osteoporosis     T -3.8 (01/2014)  . Arthritis   . Prostate cancer ([place])     surgery then lupron  .  Colon polyps     adenomatous  . Other constipation     chronic  . Unspecified venous (peripheral) insufficiency   . Vitamin B12 deficiency     Past Surgical History  Procedure Laterality Date  . Cataract extraction  2000, 2003    both eyes  . Basal cell carcinoma excision  2004    Left side of face  . Tibia fracture surgery  1999  . Prostate surgery  1996    cancer  . Volvulus reduction  12/10  . Joint replacement  2005    Left knee  . Joint replacement  12/13    Right knee    Family History  Problem Relation Age of Onset  . Diabetes Son   . Hypertension Paternal Grandfather     Social History   Social History  . Marital Status: Married    Spouse Name: [name]  . Number of Children:  3  . Years of Education: N/A   Occupational History  . [employer]     retired   Social History Main Topics  . Smoking status: Never Smoker   . Smokeless tobacco: Never Used  . Alcohol Use: 0.0 - 0.6 oz/week    0-1 Standard drinks or equivalent per week     Comment: in past  . Drug Use: Not on file  . Sexual Activity: Not on file   Other Topics Concern  . Not on file   Social History Narrative   Has living will   Daughter [name] holds health care POA.   Has DNR and we have reviewed this.   Would not want tube feedings if cognitively unaware    Review of Systems No recurrence of vertigo Appetite is very good Weight is stable though Sleeping fairly well    Objective:   Physical Exam  Constitutional: He is oriented to person, place, and time. He appears well-developed and well-nourished. No distress.  Neck: Normal range of motion. Neck supple. No thyromegaly present.  Cardiovascular: Normal rate and regular rhythm.  Exam reveals no gallop.   Soft aortic systolic murmur  Pulmonary/Chest: Effort normal and breath sounds normal. No respiratory distress. He has no wheezes. He has no rales.  Abdominal: Soft. There is no tenderness.  Musculoskeletal:  No crepitus in shoulders Slight restriction in abduction but is over 90 degrees. Fair rotation both ways  Lymphadenopathy:    He has no cervical adenopathy.  Neurological: He is alert and oriented to person, place, and time.  Skin: No rash noted. No erythema.  Psychiatric: He has a normal mood and affect. His behavior is normal.          Assessment & Plan:

## 2015-02-08 NOTE — Assessment & Plan Note (Signed)
Mild symptoms Probably from puling on bannisters at son's house Suggested trying some tylenol

## 2015-02-08 NOTE — Assessment & Plan Note (Signed)
Continues on lupron Follows at [facility]

## 2015-02-08 NOTE — Assessment & Plan Note (Signed)
Doing well with current regimen. 

## 2015-02-08 NOTE — Assessment & Plan Note (Signed)
Controlled with support socks

## 2015-02-08 NOTE — Assessment & Plan Note (Signed)
More AM congestion Suggested he go back to 2 of the loratadine daily Consider the flonase again if symptoms persist

## 2015-02-08 NOTE — Assessment & Plan Note (Signed)
No abnormal bleeding Only very mildly low

## 2015-03-08 ENCOUNTER — Other Ambulatory Visit: Payer: Self-pay | Admitting: Internal Medicine

## 2015-03-08 MED ORDER — FLUTICASONE PROPIONATE 50 MCG/ACT NA SUSP: 2.0000 | Freq: Every day | NASAL | Status: DC

## 2015-03-11 ENCOUNTER — Encounter: Payer: Self-pay | Admitting: Emergency Medicine

## 2015-03-11 ENCOUNTER — Emergency Department: Payer: Medicare Other

## 2015-03-11 ENCOUNTER — Emergency Department
Admission: EM | Admit: 2015-03-11 | Discharge: 2015-03-11 | Disposition: A | Discharge status: Home / Self Care | Payer: Medicare Other | Attending: Emergency Medicine | Admitting: Emergency Medicine

## 2015-03-11 DIAGNOSIS — Z792 Long term (current) use of antibiotics: Secondary | ICD-10-CM | POA: Insufficient documentation

## 2015-03-11 DIAGNOSIS — M79604 Pain in right leg: Secondary | ICD-10-CM | POA: Diagnosis not present

## 2015-03-11 DIAGNOSIS — M25512 Pain in left shoulder: Secondary | ICD-10-CM | POA: Insufficient documentation

## 2015-03-11 DIAGNOSIS — M25571 Pain in right ankle and joints of right foot: Secondary | ICD-10-CM | POA: Diagnosis not present

## 2015-03-11 DIAGNOSIS — M546 Pain in thoracic spine: Secondary | ICD-10-CM | POA: Diagnosis not present

## 2015-03-11 DIAGNOSIS — M79601 Pain in right arm: Secondary | ICD-10-CM

## 2015-03-11 DIAGNOSIS — Z79899 Other long term (current) drug therapy: Secondary | ICD-10-CM | POA: Diagnosis not present

## 2015-03-11 DIAGNOSIS — M25511 Pain in right shoulder: Secondary | ICD-10-CM | POA: Diagnosis not present

## 2015-03-11 DIAGNOSIS — Z743 Need for continuous supervision: Secondary | ICD-10-CM | POA: Diagnosis not present

## 2015-03-11 DIAGNOSIS — M79602 Pain in left arm: Secondary | ICD-10-CM | POA: Diagnosis not present

## 2015-03-11 DIAGNOSIS — R079 Chest pain, unspecified: Secondary | ICD-10-CM | POA: Diagnosis not present

## 2015-03-11 DIAGNOSIS — Z7982 Long term (current) use of aspirin: Secondary | ICD-10-CM | POA: Diagnosis not present

## 2015-03-11 DIAGNOSIS — M62838 Other muscle spasm: Secondary | ICD-10-CM | POA: Insufficient documentation

## 2015-03-11 DIAGNOSIS — M79622 Pain in left upper arm: Secondary | ICD-10-CM | POA: Diagnosis not present

## 2015-03-11 DIAGNOSIS — Z7951 Long term (current) use of inhaled steroids: Secondary | ICD-10-CM | POA: Diagnosis not present

## 2015-03-11 DIAGNOSIS — M79621 Pain in right upper arm: Secondary | ICD-10-CM | POA: Insufficient documentation

## 2015-03-11 LAB — CBC WITH DIFFERENTIAL/PLATELET
BASOS PCT: 4 %
Basophils Absolute: 0.2 10*3/uL — ABNORMAL HIGH (ref 0–0.1)
Eosinophils Absolute: 0.3 10*3/uL (ref 0–0.7)
Eosinophils Relative: 5 %
HEMATOCRIT: 34.9 % — AB (ref 40.0–52.0)
HEMOGLOBIN: 11.6 g/dL — AB (ref 13.0–18.0)
LYMPHS ABS: 1.5 10*3/uL (ref 1.0–3.6)
LYMPHS PCT: 24 %
MCH: 27.7 pg (ref 26.0–34.0)
MCHC: 33.3 g/dL (ref 32.0–36.0)
MCV: 83.2 fL (ref 80.0–100.0)
MONO ABS: 0.6 10*3/uL (ref 0.2–1.0)
MONOS PCT: 10 %
NEUTROS ABS: 3.5 10*3/uL (ref 1.4–6.5)
NEUTROS PCT: 57 %
Platelets: 153 10*3/uL (ref 150–440)
RBC: 4.2 MIL/uL — ABNORMAL LOW (ref 4.40–5.90)
RDW: 15.3 % — ABNORMAL HIGH (ref 11.5–14.5)
WBC: 6.1 10*3/uL (ref 3.8–10.6)

## 2015-03-11 LAB — BASIC METABOLIC PANEL
ANION GAP: 7 (ref 5–15)
BUN: 16 mg/dL (ref 6–20)
CHLORIDE: 107 mmol/L (ref 101–111)
CO2: 23 mmol/L (ref 22–32)
Calcium: 8.9 mg/dL (ref 8.9–10.3)
Creatinine, Ser: 0.86 mg/dL (ref 0.61–1.24)
GFR calc non Af Amer: >60 mL/min (ref >60)
GLUCOSE: 112 mg/dL — AB (ref 65–99)
Potassium: 4.5 mmol/L (ref 3.5–5.1)
Sodium: 137 mmol/L (ref 135–145)

## 2015-03-11 LAB — TROPONIN I: Troponin I: <0.03 ng/mL (ref <0.031)

## 2015-03-11 NOTE — Discharge Instructions (Signed)
You were evaluated for upper back and arm pain, and although no certain cause was found, I am most suspicious your symptoms are from musculoskeletal pain. Your exam and evaluation are reassuring in the emergency department.  Return to the emergency department for any worsening condition including any worsening pain, weakness or numbness, chest pain or trouble breathing, abdominal pain, nausea or vomiting, dizziness or passing out, or any other symptoms concerning to you.  Continue to treat sore muscles at home with over-the-counter Tylenol and/or ibuprofen as needed. Take a break for about one week from piano, computer, and any other repetitive or exertional activity with your arms and shoulders to allow time to heal.   Musculoskeletal Pain Musculoskeletal pain is muscle and boney aches and pains. These pains can occur in any part of the body. Your caregiver may treat you without knowing the cause of the pain. They may treat you if blood or urine tests, X-rays, and other tests were normal.  CAUSES There is often not a definite cause or reason for these pains. These pains may be caused by a type of germ (virus). The discomfort may also come from overuse. Overuse includes working out too hard when your body is not fit. Boney aches also come from weather changes. Bone is sensitive to atmospheric pressure changes. HOME CARE INSTRUCTIONS   Ask when your test results will be ready. Make sure you get your test results.  Only take over-the-counter or prescription medicines for pain, discomfort, or fever as directed by your caregiver. If you were given medications for your condition, do not drive, operate machinery or power tools, or sign legal documents for 24 hours. Do not drink alcohol. Do not take sleeping pills or other medications that may interfere with treatment.  Continue all activities unless the activities cause more pain. When the pain lessens, slowly resume normal activities. Gradually increase  the intensity and duration of the activities or exercise.  During periods of severe pain, bed rest may be helpful. Lay or sit in any position that is comfortable.  Putting ice on the injured area.  Put ice in a bag.  Place a towel between your skin and the bag.  Leave the ice on for 15 to 20 minutes, 3 to 4 times a day.  Follow up with your caregiver for continued problems and no reason can be found for the pain. If the pain becomes worse or does not go away, it may be necessary to repeat tests or do additional testing. Your caregiver may need to look further for a possible cause. SEEK IMMEDIATE MEDICAL CARE IF:  You have pain that is getting worse and is not relieved by medications.  You develop chest pain that is associated with shortness or breath, sweating, feeling sick to your stomach (nauseous), or throw up (vomit).  Your pain becomes localized to the abdomen.  You develop any new symptoms that seem different or that concern you. MAKE SURE YOU:   Understand these instructions.  Will watch your condition.  Will get help right away if you are not doing well or get worse.   This information is not intended to replace advice given to you by your health care provider. Make sure you discuss any questions you have with your health care provider.   Document Released: 02/10/2005 Document Revised: 05/05/2011 Document Reviewed: 10/15/2012 Elsevier Interactive Patient Education [employer].

## 2015-03-11 NOTE — ED Provider Notes (Signed)
[facility] Emergency Department Provider Note   ____________________________________________  Time seen: Approximately 7:45 AM I have reviewed the triage vital signs and the triage nursing note.  HISTORY  Chief Complaint Back Pain   Historian Patient  HPI Benjamin Villarreal is a 82 y.o. male who lives at [facility], is here for evaluation of shoulder blade pain and upper arm pain bilaterally. He states that for the past couple days he has noticed some soreness especially when he wakes up in his upper back across his shoulders as well as in the upper arms.Pain is mild and goes away with ibuprofen or Tylenol, however given that its ongoing, he wanted to be evaluated. No chest pain. This morning he had an episode where he had some pain or "sensation" that extended down the right arm right abdomen down to his right foot. He states it wasn't numbness or weakness, but he is not sure was pain. It only lasted a few seconds and is better now. The pain in his arms and shoulders is reproducible when he lifts his arms especially above the level of the shoulders.  He denies any trauma. In terms of overuse, he states he typically doesn't exercise program Monday Wednesday and Friday, although he has missed a couple of these due to other engagements. He also reports that he has recently been playing a lot of [employer], and he does also use a computer.  No headaches or confusion. No nausea or chest pain or trouble breathing. No abdominal pain. No vomiting or diarrhea.    Past Medical History  Diagnosis Date  . Osteoporosis     T -3.8 (01/2014)  . Arthritis   . Prostate cancer ([place])     surgery then lupron  . Colon polyps     adenomatous  . Other constipation     chronic  . Unspecified venous (peripheral) insufficiency   . Vitamin B12 deficiency     Patient Active Problem List   Diagnosis Date Noted  . Strain of shoulder 02/08/2015  . Anemia of chronic disease  10/19/2014  . Thrombocytopenia ([place]) 07/13/2014  . Tachycardia 02/02/2014  . 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 Allergic rhinitis due to pollen 10/21/2012  . VITAMIN B12 DEFICIENCY 12/13/2009  . Prostate cancer ([place]) 03/22/2009  . Chronic venous insufficiency 09/07/2008  . CONSTIPATION, CHRONIC 09/07/2008  . Osteoarthritis, multiple sites 09/05/2008  . Osteoporosis 09/05/2008    Past Surgical History  Procedure Laterality Date  . Cataract extraction  2000, 2003    both eyes  . Basal cell carcinoma excision  2004    Left side of face  . Tibia fracture surgery  1999  . Prostate surgery  1996    cancer  . Volvulus reduction  12/10  . Joint replacement  2005    Left knee  . Joint replacement  12/13    Right knee    Current Outpatient Rx  Name  Route  Sig  Dispense  Refill  . aspirin 81 MG EC tablet   Oral   Take 81 mg by mouth every other day.          . Calcium Carbonate-Vitamin D (CALCIUM-D) 600-400 MG-UNIT TABS   Oral   Take 1 tablet by mouth daily.         . cholecalciferol (VITAMIN D) 1000 UNITS tablet   Oral   Take 1,000 Units by mouth daily.           . ciclopirox (LOPROX)  0.77 % cream   Topical   Apply topically 2 (two) times daily.           . clindamycin (CLEOCIN) 150 MG capsule   Oral   Take 150 mg by mouth. TAKES 1 HOUR PRIOR TO DENTAL WORK         . cyclobenzaprine (FLEXERIL) 5 MG tablet   Oral   Take 1 tablet (5 mg total) by mouth at bedtime as needed.   90 tablet   3   . fluticasone (FLONASE) 50 MCG/ACT nasal spray   Each Nare   Place 2 sprays into both nostrils daily. In each nostril   48 g   3   . glucosamine-chondroitin 500-400 MG tablet   Oral   Take 3 tablets by mouth daily.          Marland Kitchen leuprolide (LUPRON) 30 MG injection   Intramuscular   Inject 30 mg into the muscle every 4 (four) months. Dose and interval not clear yet         . loratadine (CLARITIN) 10 MG tablet      TAKE 1 TABLET DAILY Patient taking  differently: TAKE 2 TABLETS DAILY   90 tablet   2   . mineral oil-hydrophilic petrolatum (AQUAPHOR) ointment   Topical   Apply 1 application topically. APPLY 1 APPLICATION ON LEFT BIG TOE DAILY         . polyethylene glycol powder (MIRALAX) powder   Oral   Take 17 g by mouth 2 (two) times daily. And 8.5gm at noon   527 g   3   . sennosides-docusate sodium (SENOKOT-S) 8.6-50 MG tablet   Oral   Take 4 tablets by mouth daily.         . traMADol (ULTRAM) 50 MG tablet   Oral   Take 1 tablet (50 mg total) by mouth 3 (three) times daily as needed.   270 tablet   0   . triamcinolone cream (KENALOG) 0.1 %   Topical   Apply topically 2 (two) times daily.         . vitamin B-12 (CYANOCOBALAMIN) 1000 MCG tablet   Oral   Take 1 tablet (1,000 mcg total) by mouth daily.   90 tablet   3   . vitamin C (ASCORBIC ACID) 500 MG tablet   Oral   Take 500 mg by mouth daily.         . zoledronic acid (RECLAST) 5 MG/100ML SOLN injection   Intravenous   Inject 5 mg into the vein once. ?yearly           Allergies Other  Family History  Problem Relation Age of Onset  . Diabetes Son   . Hypertension Paternal Grandfather     Social History Social History  Substance Use Topics  . Smoking status: Never Smoker   . Smokeless tobacco: Never Used  . Alcohol Use: 0.0 - 0.6 oz/week    0-1 Standard drinks or equivalent per week     Comment: in past     Review of Systems  Constitutional: Negative for fever. Eyes: Negative for visual changes. ENT: Negative for sore throat. Cardiovascular: Negative for chest pain. Respiratory: Negative for shortness of breath. Gastrointestinal: Negative for abdominal pain, vomiting and diarrhea. Genitourinary: Negative for dysuria. Musculoskeletal: Negative for neck pain. Negative for low back pain. Moderate shoulder blade/upper thoracic pain.. Skin: Negative for rash. Neurological: Negative for headache. 10 point Review of Systems otherwise  negative ____________________________________________   PHYSICAL EXAM:  VITAL SIGNS: ED Triage Vitals  Enc Vitals Group     BP 03/11/15 0723 136/71 mmHg     Pulse Rate 03/11/15 0723 66     Resp 03/11/15 0800 19     Temp 03/11/15 0723 98.7 F (37.1 C)     Temp Source 03/11/15 0723 Oral     SpO2 03/11/15 0723 98 %     Weight 03/11/15 0723 171 lb (77.565 kg)     Height 03/11/15 0723 6' 2.5" (1.892 m)     Head Cir --      Peak Flow --      Pain Score 03/11/15 0731 0     Pain Loc --      Pain Edu? --      Excl. in [place]? --      Constitutional: Alert and oriented. Well appearing and in no distress. Eyes: Conjunctivae are normal. PERRL. Normal extraocular movements. ENT   Head: Normocephalic and atraumatic.   Nose: No congestion/rhinnorhea.   Mouth/Throat: Mucous membranes are moist.   Neck: No stridor. Cardiovascular/Chest: Normal rate, regular rhythm.  No murmurs, rubs, or gallops. Respiratory: Normal respiratory effort without tachypnea nor retractions. Breath sounds are clear and equal bilaterally. No wheezes/rales/rhonchi. Gastrointestinal: Soft. No distention, no guarding, no rebound. Nontender.    Genitourinary/rectal:Deferred Musculoskeletal: Firm muscle spasm in the trapezius especially left but also right, which is not particularly tender to palpation. Mild tenderness without real focal point tenderness in the upper thoracic spine to the middle of the shoulder blade area. No skin changes there. Some muscle wasting in the shoulder girdle and also the deltoid bilaterally. Muscles of the deltoid tender to palpation. Tenderness of the upper arm/shoulder when the patient raises his arms to the lateral shoulders. Neurologic:  Normal speech and language. No gross or focal neurologic deficits are appreciated. Skin:  Skin is warm, dry and intact. No rash noted. Psychiatric: Mood and affect are normal. Speech and behavior are normal. Patient exhibits appropriate insight  and judgment.  ____________________________________________   EKG I, Lisa Roca, MD, the attending physician have personally viewed and interpreted all ECGs.  66 bpm. Narrow QRS. Normal axis. Normal ST and T-wave ____________________________________________  LABS (pertinent positives/negatives)  Basic metabolic panel within normal limits White blood cell count 6.1, hemoglobin 11.6 and platelet count 153 Troponin less than 0.03  ____________________________________________  RADIOLOGY All Xrays were viewed by me. Imaging interpreted by Radiologist.  Chest x-ray two-view: No active cardiopulmonary disease  Thoracic spine 2 view: No acute findings. Mild degenerative spondylosis and dextroscoliosis. Osteopenia. __________________________________________  PROCEDURES  Procedure(s) performed: None  Critical Care performed: None  ____________________________________________   ED COURSE / ASSESSMENT AND PLAN  CONSULTATIONS: None  Pertinent labs & imaging results that were available during my care of the patient were reviewed by me and considered in my medical decision making (see chart for details).  I am most suspicious of musculoskeletal pain at the trapezius and deltoids bilaterally which is reproducible when he uses those motor functions. He does report increased practicing in the piano which may be responsible for overuse soreness.  I have extremely low suspicion for bony abnormalities, but will check chest and thoracic x-ray to evaluate for spontaneous compression fracture given history of osteoporosis. Also since he had some discomfort going down his arms, I will check labs in case this is an atypical ACS picture, although I find this extremely unlikely clinically.  Are not sure what to make of the sensation that went all the way down  his abdomen into his leg. This would not be consistent with cervical radiculopathy, cardiac symptoms, or muscular skeletal overuse  soreness. Does not seem like it was a sensory loss or weakness, and it doesn't sound like a stroke or TIA. Those symptoms are no longer there and I do not think this is due to an acute vascular emergency causing above and below the diaphragm symptoms.  I have reviewed his laboratory studies, clinical findings, and imaging studies and found no alternative or emergency source of his symptoms.  Patient / Family / Caregiver informed of clinical course, medical decision-making process, and agree with plan.   I discussed return precautions, follow-up instructions, and discharged instructions with patient and/or family.  ___________________________________________   FINAL CLINICAL IMPRESSION(S) / ED DIAGNOSES   Final diagnoses:  Musculoskeletal arm pain, right  Musculoskeletal arm pain, left              Note: This dictation was prepared with Dragon dictation. Any transcriptional errors that result from this process are unintentional   Lisa Roca, MD 03/11/15 [id number]

## 2015-03-11 NOTE — ED Notes (Signed)
right sided body pain x 3 days from shoulder to ankle worse with tylenol, states it is not enough to debilitate him but just wants to get checked out.  12 lead unremarkable, vital signs stable.  patient states that the pain is localized between his shoulder blades and is worsened when he moves his arms, especially above his head.  States he is comfortable now but knows it will hurt if he tries to scratch his head.

## 2015-03-11 NOTE — ED Notes (Signed)
Patient transported to X-ray 

## 2015-03-14 ENCOUNTER — Non-Acute Institutional Stay: Payer: Medicare Other | Admitting: Internal Medicine

## 2015-03-14 ENCOUNTER — Encounter: Payer: Self-pay | Admitting: Internal Medicine

## 2015-03-14 VITALS — BP 124/66 | HR 68 | Temp 98.3°F | Resp 16

## 2015-03-14 DIAGNOSIS — M25511 Pain in right shoulder: Secondary | ICD-10-CM

## 2015-03-14 NOTE — Progress Notes (Signed)
Subjective:    Patient ID: Benjamin Villarreal, male    DOB: [birth year], 82 y.o.   MRN: [id number]  HPI  Asked to evaluate resident for ER followup of right shoulder pain ED ECG, thoracic spine xray, chest xray reviewed- no acute findings ER MD felt this was MSK in origin, advised to continue current therapy and follow up with PCP in 1 week. Resident reports right shoulder still painful. Worse when he tries to lay on his right side. He has trouble moving his arm above his head and out to the side. He has not noticed any weakness. He is having trouble getting dressed. PT has already been ordered but he has not been evaluated. He has also gotten an order for a new rollator. Tylenol BID seems to be controlling his pain. He denies chest pain or shortness of breath.  Review of Systems      Past Medical History  Diagnosis Date  . Osteoporosis     T -3.8 (01/2014)  . Arthritis   . Prostate cancer ([place])     surgery then lupron  . Colon polyps     adenomatous  . Other constipation     chronic  . Unspecified venous (peripheral) insufficiency   . Vitamin B12 deficiency     Current Outpatient Prescriptions  Medication Sig Dispense Refill  . aspirin 81 MG EC tablet Take 81 mg by mouth every other day.     . Calcium Carbonate-Vitamin D (CALCIUM-D) 600-400 MG-UNIT TABS Take 1 tablet by mouth daily.    . cholecalciferol (VITAMIN D) 1000 UNITS tablet Take 1,000 Units by mouth daily.      . ciclopirox (LOPROX) 0.77 % cream Apply topically 2 (two) times daily.      . clindamycin (CLEOCIN) 150 MG capsule Take 150 mg by mouth. TAKES 1 HOUR PRIOR TO DENTAL WORK    . cyclobenzaprine (FLEXERIL) 5 MG tablet Take 1 tablet (5 mg total) by mouth at bedtime as needed. 90 tablet 3  . fluticasone (FLONASE) 50 MCG/ACT nasal spray Place 2 sprays into both nostrils daily. In each nostril 48 g 3  . glucosamine-chondroitin 500-400 MG tablet Take 3 tablets by mouth daily.     Marland Kitchen leuprolide (LUPRON) 30 MG injection  Inject 30 mg into the muscle every 4 (four) months. Dose and interval not clear yet    . loratadine (CLARITIN) 10 MG tablet TAKE 1 TABLET DAILY (Patient taking differently: TAKE 2 TABLETS DAILY) 90 tablet 2  . mineral oil-hydrophilic petrolatum (AQUAPHOR) ointment Apply 1 application topically. APPLY 1 APPLICATION ON LEFT BIG TOE DAILY    . polyethylene glycol powder (MIRALAX) powder Take 17 g by mouth 2 (two) times daily. And 8.5gm at noon 527 g 3  . sennosides-docusate sodium (SENOKOT-S) 8.6-50 MG tablet Take 4 tablets by mouth daily.    . traMADol (ULTRAM) 50 MG tablet Take 1 tablet (50 mg total) by mouth 3 (three) times daily as needed. 270 tablet 0  . triamcinolone cream (KENALOG) 0.1 % Apply topically 2 (two) times daily.    . vitamin B-12 (CYANOCOBALAMIN) 1000 MCG tablet Take 1 tablet (1,000 mcg total) by mouth daily. 90 tablet 3  . vitamin C (ASCORBIC ACID) 500 MG tablet Take 500 mg by mouth daily.    . zoledronic acid (RECLAST) 5 MG/100ML SOLN injection Inject 5 mg into the vein once. ?yearly     No current facility-administered medications for this visit.   Facility-Administered Medications Ordered in Other Visits  Medication Dose  Route Frequency Provider Last Rate Last Dose  . leuprolide (LUPRON) injection 30 mg  30 mg Intramuscular Once Leia Alf, MD        Allergies  Allergen Reactions  . Other     RAW ONIONS- SINUS REACTION    Family History  Problem Relation Age of Onset  . Diabetes Son   . Hypertension Paternal Grandfather     Social History   Social History  . Marital Status: Widowed    Spouse Name: [name]  . Number of Children: 3  . Years of Education: N/A   Occupational History  . [employer]     retired   Social History Main Topics  . Smoking status: Never Smoker   . Smokeless tobacco: Never Used  . Alcohol Use: 0.0 - 0.6 oz/week    0-1 Standard drinks or equivalent per week     Comment: in past  . Drug Use: Not on file  . Sexual  Activity: Not on file   Other Topics Concern  . Not on file   Social History Narrative   Has living will   Daughter [name] holds health care POA.   Has DNR and we have reviewed this.   Would not want tube feedings if cognitively unaware     Constitutional: Denies fever, malaise, fatigue, headache or abrupt weight changes.  Respiratory: Denies difficulty breathing, shortness of breath, cough or sputum production. Cardiovascular: Denies chest pain, chest tightness, palpitations or swelling in the hands or feet.  Musculoskeletal: Pt reports right shoulder pain. Denies difficulty with gait, muscle pain or joint swelling.  Skin: Denies redness, rashes, lesions or ulcercations.  Neurological: Pt reports tingling in his hands and feet. Denies dizziness, difficulty with memory, difficulty with speech.    No other specific complaints in a complete review of systems (except as listed in HPI above).  Objective:   Physical Exam  BP 124/66 mmHg  Pulse 68  Temp(Src) 98.3 F (36.8 C)  Resp 16 Wt Readings from Last 3 Encounters:  03/11/15 171 lb (77.565 kg)  02/08/15 176 lb (79.833 kg)  11/20/14 169 lb 15.6 oz (77.1 kg)    General: Appears his stated age,in NAD. Cardiovascular: Normal rate and rhythm.  Pulmonary/Chest: Normal effort and positive vesicular breath sounds. No respiratory distress. No wheezes, rales or ronchi noted.  Musculoskeletal: Decreased internal and external rotation of the right shoulder. Pain with palpation of the right subacromial bursa. Strength 4/5 RUE, 5/5 LUE. Positive drop can test.    BMET    Component Value Date/Time   NA 137 03/11/2015 0850   NA 140 02/11/2012 0415   K 4.5 03/11/2015 0850   K 3.9 02/11/2012 0415   CL 107 03/11/2015 0850   CL 108* 02/11/2012 0415   CO2 23 03/11/2015 0850   CO2 25 02/11/2012 0415   GLUCOSE 112* 03/11/2015 0850   GLUCOSE 125* 02/11/2012 0415   BUN 16 03/11/2015 0850   BUN 12 02/11/2012 0415   CREATININE 0.86  03/11/2015 0850   CREATININE 1.00 03/23/2014 1400   CALCIUM 8.9 03/11/2015 0850   CALCIUM 8.3* 03/23/2014 1400   GFRNONAA >60 03/11/2015 0850   GFRNONAA >60 03/23/2014 1400   GFRNONAA 49* 09/02/2013 1428   GFRAA >60 03/11/2015 0850   GFRAA >60 03/23/2014 1400   GFRAA 57* 09/02/2013 1428    Lipid Panel  No results found for: CHOL, TRIG, HDL, CHOLHDL, VLDL, LDLCALC  CBC    Component Value Date/Time   WBC 6.1 03/11/2015 0850  WBC 5.6 03/07/2014 1414   RBC 4.20* 03/11/2015 0850   RBC 4.05* 03/07/2014 1414   HGB 11.6* 03/11/2015 0850   HGB 11.3* 03/07/2014 1414   HCT 34.9* 03/11/2015 0850   HCT 34.2* 03/07/2014 1414   PLT 153 03/11/2015 0850   PLT 143* 03/07/2014 1414   MCV 83.2 03/11/2015 0850   MCV 84 03/07/2014 1414   MCH 27.7 03/11/2015 0850   MCH 27.8 03/07/2014 1414   MCHC 33.3 03/11/2015 0850   MCHC 33.0 03/07/2014 1414   RDW 15.3* 03/11/2015 0850   RDW 15.2* 03/07/2014 1414   LYMPHSABS 1.5 03/11/2015 0850   LYMPHSABS 1.8 03/07/2014 1414   MONOABS 0.6 03/11/2015 0850   MONOABS 0.5 03/07/2014 1414   EOSABS 0.3 03/11/2015 0850   EOSABS 0.3 03/07/2014 1414   BASOSABS 0.2* 03/11/2015 0850   BASOSABS 0.2* 03/07/2014 1414    Hgb A1C No results found for: HGBA1C       Assessment & Plan:   Right shoulder pain:  Go ahead with PT eval and treatment RX for new rollator Portable xray of right shoulder Possible rotator cuff tear on right, but will not likely pursue surgical intervention, just PT Continue Tylenol BID He has Tramadol prn if needed for severe pain  Will follow up as needed

## 2015-03-15 ENCOUNTER — Other Ambulatory Visit: Payer: Self-pay | Admitting: Internal Medicine

## 2015-03-20 DIAGNOSIS — M25511 Pain in right shoulder: Secondary | ICD-10-CM | POA: Diagnosis not present

## 2015-03-20 DIAGNOSIS — M25512 Pain in left shoulder: Secondary | ICD-10-CM | POA: Diagnosis not present

## 2015-03-20 DIAGNOSIS — M6281 Muscle weakness (generalized): Secondary | ICD-10-CM | POA: Diagnosis not present

## 2015-03-23 DIAGNOSIS — M25511 Pain in right shoulder: Secondary | ICD-10-CM | POA: Diagnosis not present

## 2015-03-23 DIAGNOSIS — M25512 Pain in left shoulder: Secondary | ICD-10-CM | POA: Diagnosis not present

## 2015-03-23 DIAGNOSIS — M6281 Muscle weakness (generalized): Secondary | ICD-10-CM | POA: Diagnosis not present

## 2015-03-26 ENCOUNTER — Ambulatory Visit: Payer: Medicare Other

## 2015-03-26 ENCOUNTER — Ambulatory Visit: Payer: Medicare Other | Admitting: Internal Medicine

## 2015-03-26 ENCOUNTER — Other Ambulatory Visit: Payer: Medicare Other

## 2015-03-27 ENCOUNTER — Other Ambulatory Visit: Payer: Self-pay | Admitting: *Deleted

## 2015-03-27 ENCOUNTER — Encounter: Payer: Self-pay | Admitting: *Deleted

## 2015-03-27 DIAGNOSIS — M25511 Pain in right shoulder: Secondary | ICD-10-CM | POA: Diagnosis not present

## 2015-03-27 DIAGNOSIS — M25512 Pain in left shoulder: Secondary | ICD-10-CM | POA: Diagnosis not present

## 2015-03-27 DIAGNOSIS — C61 Malignant neoplasm of prostate: Secondary | ICD-10-CM

## 2015-03-27 DIAGNOSIS — M6281 Muscle weakness (generalized): Secondary | ICD-10-CM | POA: Diagnosis not present

## 2015-03-28 ENCOUNTER — Inpatient Hospital Stay: Payer: Medicare Other

## 2015-03-28 ENCOUNTER — Inpatient Hospital Stay (HOSPITAL_BASED_OUTPATIENT_CLINIC_OR_DEPARTMENT_OTHER): Payer: Medicare Other | Admitting: Internal Medicine

## 2015-03-28 ENCOUNTER — Inpatient Hospital Stay: Payer: Medicare Other | Attending: Internal Medicine

## 2015-03-28 VITALS — BP 142/68 | HR 68 | Temp 97.2°F | Resp 18 | Ht 74.5 in | Wt 174.6 lb

## 2015-03-28 DIAGNOSIS — Z86010 Personal history of colonic polyps: Secondary | ICD-10-CM

## 2015-03-28 DIAGNOSIS — M818 Other osteoporosis without current pathological fracture: Secondary | ICD-10-CM | POA: Diagnosis not present

## 2015-03-28 DIAGNOSIS — I872 Venous insufficiency (chronic) (peripheral): Secondary | ICD-10-CM | POA: Diagnosis not present

## 2015-03-28 DIAGNOSIS — K5900 Constipation, unspecified: Secondary | ICD-10-CM | POA: Insufficient documentation

## 2015-03-28 DIAGNOSIS — Z79818 Long term (current) use of other agents affecting estrogen receptors and estrogen levels: Secondary | ICD-10-CM | POA: Insufficient documentation

## 2015-03-28 DIAGNOSIS — R232 Flushing: Secondary | ICD-10-CM | POA: Insufficient documentation

## 2015-03-28 DIAGNOSIS — Z79899 Other long term (current) drug therapy: Secondary | ICD-10-CM

## 2015-03-28 DIAGNOSIS — M129 Arthropathy, unspecified: Secondary | ICD-10-CM | POA: Diagnosis not present

## 2015-03-28 DIAGNOSIS — C61 Malignant neoplasm of prostate: Secondary | ICD-10-CM

## 2015-03-28 DIAGNOSIS — E538 Deficiency of other specified B group vitamins: Secondary | ICD-10-CM | POA: Diagnosis not present

## 2015-03-28 DIAGNOSIS — Z7982 Long term (current) use of aspirin: Secondary | ICD-10-CM

## 2015-03-28 DIAGNOSIS — M810 Age-related osteoporosis without current pathological fracture: Secondary | ICD-10-CM

## 2015-03-28 LAB — HEPATIC FUNCTION PANEL
ALBUMIN: 4 g/dL (ref 3.5–5.0)
ALK PHOS: 56 U/L (ref 38–126)
ALT: 15 U/L — AB (ref 17–63)
AST: 22 U/L (ref 15–41)
Bilirubin, Direct: 0.1 mg/dL (ref 0.1–0.5)
Indirect Bilirubin: 0.4 mg/dL (ref 0.3–0.9)
TOTAL PROTEIN: 7.1 g/dL (ref 6.5–8.1)
Total Bilirubin: 0.5 mg/dL (ref 0.3–1.2)

## 2015-03-28 LAB — CBC WITH DIFFERENTIAL/PLATELET
Basophils Absolute: 0.1 10*3/uL (ref 0–0.1)
Basophils Relative: 2 %
Eosinophils Absolute: 0.4 10*3/uL (ref 0–0.7)
Eosinophils Relative: 6 %
HEMATOCRIT: 34 % — AB (ref 40.0–52.0)
HEMOGLOBIN: 11.4 g/dL — AB (ref 13.0–18.0)
LYMPHS ABS: 1.7 10*3/uL (ref 1.0–3.6)
LYMPHS PCT: 29 %
MCH: 28 pg (ref 26.0–34.0)
MCHC: 33.6 g/dL (ref 32.0–36.0)
MCV: 83.4 fL (ref 80.0–100.0)
Monocytes Absolute: 0.5 10*3/uL (ref 0.2–1.0)
Monocytes Relative: 9 %
NEUTROS ABS: 3.2 10*3/uL (ref 1.4–6.5)
NEUTROS PCT: 54 %
Platelets: 156 10*3/uL (ref 150–440)
RBC: 4.08 MIL/uL — AB (ref 4.40–5.90)
RDW: 15.4 % — ABNORMAL HIGH (ref 11.5–14.5)
WBC: 5.8 10*3/uL (ref 3.8–10.6)

## 2015-03-28 LAB — PSA: PSA: 3.2 ng/mL (ref 0.00–4.00)

## 2015-03-28 LAB — CREATININE, SERUM
Creatinine, Ser: 0.86 mg/dL (ref 0.61–1.24)
GFR calc non Af Amer: >60 mL/min (ref >60)

## 2015-03-28 LAB — CALCIUM: Calcium: 8.7 mg/dL — ABNORMAL LOW (ref 8.9–10.3)

## 2015-03-28 MED ORDER — LEUPROLIDE ACETATE (3 MONTH) 22.5 MG IM KIT
22.5000 mg | PACK | Freq: Once | INTRAMUSCULAR | Status: AC
  Administered 2015-03-28: 22.5 mg via INTRAMUSCULAR
  Filled 2015-03-28: qty 22.5

## 2015-03-28 MED ORDER — DENOSUMAB 60 MG/ML ~~LOC~~ SOLN
60.0000 mg | Freq: Once | SUBCUTANEOUS | Status: AC
  Administered 2015-03-28: 60 mg via SUBCUTANEOUS
  Filled 2015-03-28: qty 1

## 2015-03-28 NOTE — Progress Notes (Signed)
[facility] OFFICE PROGRESS NOTE  Patient Care Team: Venia Carbon, MD as PCP - General   SUMMARY OF ONCOLOGIC HISTORY:  # 1996 PROSTATE CANCER [Gleason score 2+3] s/p Radical Prostatectomy [[facility]]; ?Biochem RECURRENT PROSTATE CANCER-Hormone sensitive;  Intermittent Lupron; on Lupron q4M [Jan 2016 PSA- 7.9].  Sep 2016- 3.3; HOLD LUPRON in FEB 2017   # Osteoporosis [[facility]- 2015]- on Reclast q 29M; FEB 2017- Start Prolia q 6 M  INTERVAL HISTORY:  This is my first interaction with the patient since I joined the practice September 2016. I reviewed the patient's prior charts/pertinent labs/imaging in detail; findings are summarized above.   A very pleasant 82 year old male patient with above history of prostate cancer recurrent on Lupron every 4 months is here for follow-up. Patient has intermittent hot flashes however this is not bothersome. His appetite is good. He is not losing weight. Denies any worsening pain.  Patient lives in an assisted living; he is fairly active for his age.   REVIEW OF SYSTEMS:  A complete 10 point review of system is done which is negative except mentioned above/history of present illness.   PAST MEDICAL HISTORY :  Past Medical History  Diagnosis Date  . Osteoporosis 01/2014    T -3.8 (01/2014)  . Arthritis   . Prostate cancer ([place])     surgery then lupron  . Colon polyps     adenomatous  . Other constipation     chronic  . Unspecified venous (peripheral) insufficiency   . Vitamin B12 deficiency     PAST SURGICAL HISTORY :   Past Surgical History  Procedure Laterality Date  . Cataract extraction  2000, 2003    both eyes  . Basal cell carcinoma excision  2004    Left side of face  . Tibia fracture surgery  1999  . Prostate surgery  1996    cancer  . Volvulus reduction  12/10  . Joint replacement  2005    Left knee  . Joint replacement  12/13    Right knee    FAMILY HISTORY :   Family History  Problem Relation Age of Onset   . Diabetes Son   . Hypertension Paternal Grandfather     SOCIAL HISTORY:   Social History  Substance Use Topics  . Smoking status: Never Smoker   . Smokeless tobacco: Never Used  . Alcohol Use: 0.0 - 0.6 oz/week    0-1 Standard drinks or equivalent per week     Comment: in past    ALLERGIES:  is allergic to other.  MEDICATIONS:  Current Outpatient Prescriptions  Medication Sig Dispense Refill  . aspirin 81 MG EC tablet Take 81 mg by mouth every other day.     . Calcium Carbonate-Vitamin D (CALCIUM-D) 600-400 MG-UNIT TABS Take 1 tablet by mouth daily.    . cholecalciferol (VITAMIN D) 1000 UNITS tablet Take 1,000 Units by mouth daily.      . ciclopirox (LOPROX) 0.77 % cream Apply topically 2 (two) times daily.      . clindamycin (CLEOCIN) 150 MG capsule Take 150 mg by mouth. TAKES 1 HOUR PRIOR TO DENTAL WORK    . cyclobenzaprine (FLEXERIL) 5 MG tablet Take 1 tablet (5 mg total) by mouth at bedtime as needed. 90 tablet 3  . fluticasone (FLONASE) 50 MCG/ACT nasal spray Place 2 sprays into both nostrils daily. In each nostril 48 g 3  . glucosamine-chondroitin 500-400 MG tablet Take 3 tablets by mouth daily.     Marland Kitchen  ibuprofen (ADVIL,MOTRIN) 200 MG tablet Take 800 mg by mouth daily as needed.    Marland Kitchen leuprolide (LUPRON) 30 MG injection Inject 30 mg into the muscle every 4 (four) months. Dose and interval not clear yet    . loratadine (CLARITIN) 10 MG tablet TAKE 1 TABLET DAILY 90 tablet 1  . mineral oil-hydrophilic petrolatum (AQUAPHOR) ointment Apply 1 application topically. APPLY 1 APPLICATION ON LEFT BIG TOE DAILY    . polyethylene glycol powder (MIRALAX) powder Take 17 g by mouth 2 (two) times daily. And 8.5gm at noon 527 g 3  . sennosides-docusate sodium (SENOKOT-S) 8.6-50 MG tablet Take 4 tablets by mouth daily.    . traMADol (ULTRAM) 50 MG tablet Take 1 tablet (50 mg total) by mouth 3 (three) times daily as needed. 270 tablet 0  . triamcinolone cream (KENALOG) 0.1 % Apply topically 2  (two) times daily.    . vitamin B-12 (CYANOCOBALAMIN) 1000 MCG tablet Take 1 tablet (1,000 mcg total) by mouth daily. 90 tablet 3  . vitamin C (ASCORBIC ACID) 500 MG tablet Take 500 mg by mouth daily.    . zoledronic acid (RECLAST) 5 MG/100ML SOLN injection Inject 5 mg into the vein once. ?yearly     No current facility-administered medications for this visit.   Facility-Administered Medications Ordered in Other Visits  Medication Dose Route Frequency Provider Last Rate Last Dose  . leuprolide (LUPRON) injection 30 mg  30 mg Intramuscular Once Leia Alf, MD        PHYSICAL EXAMINATION: ECOG PERFORMANCE STATUS: 1 - Symptomatic but completely ambulatory  BP 142/68 mmHg  Pulse 68  Temp(Src) 97.2 F (36.2 C) (Tympanic)  Resp 18  Ht 6' 2.5" (1.892 m)  Wt 174 lb 9.7 oz (79.2 kg)  BMI 22.12 kg/m2  Filed Weights   03/28/15 1033  Weight: 174 lb 9.7 oz (79.2 kg)    GENERAL: Well-nourished well-developed; Alert, no distress and comfortable.  Alone; walks with a rolling walker EYES: no pallor or icterus OROPHARYNX: no thrush or ulceration; good dentition  NECK: supple, no masses felt LYMPH:  no palpable lymphadenopathy in the cervical, axillary or inguinal regions LUNGS: clear to auscultation and  No wheeze or crackles HEART/CVS: regular rate & rhythm and no murmurs; No lower extremity edema ABDOMEN:abdomen soft, non-tender and normal bowel sounds Musculoskeletal:no cyanosis of digits and no clubbing  PSYCH: alert & oriented x 3 with fluent speech NEURO: no focal motor/sensory deficits SKIN:  no rashes or significant lesions  LABORATORY DATA:  I have reviewed the data as listed    Component Value Date/Time   NA 137 03/11/2015 0850   NA 140 02/11/2012 0415   K 4.5 03/11/2015 0850   K 3.9 02/11/2012 0415   CL 107 03/11/2015 0850   CL 108* 02/11/2012 0415   CO2 23 03/11/2015 0850   CO2 25 02/11/2012 0415   GLUCOSE 112* 03/11/2015 0850   GLUCOSE 125* 02/11/2012 0415   BUN  16 03/11/2015 0850   BUN 12 02/11/2012 0415   CREATININE 0.86 03/28/2015 1010   CREATININE 1.00 03/23/2014 1400   CALCIUM 8.7* 03/28/2015 1010   CALCIUM 8.3* 03/23/2014 1400   PROT 7.1 03/28/2015 1010   PROT 7.0 03/07/2014 1414   ALBUMIN 4.0 03/28/2015 1010   ALBUMIN 3.7 03/07/2014 1414   AST 22 03/28/2015 1010   AST 16 03/07/2014 1414   ALT 15* 03/28/2015 1010   ALT 19 03/07/2014 1414   ALKPHOS 56 03/28/2015 1010   ALKPHOS 69 03/07/2014 1414  BILITOT 0.5 03/28/2015 1010   BILITOT 0.3 03/07/2014 1414   GFRNONAA >60 03/28/2015 1010   GFRNONAA >60 03/23/2014 1400   GFRNONAA 49* 09/02/2013 1428   GFRAA >60 03/28/2015 1010   GFRAA >60 03/23/2014 1400   GFRAA 57* 09/02/2013 1428    No results found for: SPEP, UPEP  Lab Results  Component Value Date   WBC 5.8 03/28/2015   NEUTROABS 3.2 03/28/2015   HGB 11.4* 03/28/2015   HCT 34.0* 03/28/2015   MCV 83.4 03/28/2015   PLT 156 03/28/2015      Chemistry      Component Value Date/Time   NA 137 03/11/2015 0850   NA 140 02/11/2012 0415   K 4.5 03/11/2015 0850   K 3.9 02/11/2012 0415   CL 107 03/11/2015 0850   CL 108* 02/11/2012 0415   CO2 23 03/11/2015 0850   CO2 25 02/11/2012 0415   BUN 16 03/11/2015 0850   BUN 12 02/11/2012 0415   CREATININE 0.86 03/28/2015 1010   CREATININE 1.00 03/23/2014 1400      Component Value Date/Time   CALCIUM 8.7* 03/28/2015 1010   CALCIUM 8.3* 03/23/2014 1400   ALKPHOS 56 03/28/2015 1010   ALKPHOS 69 03/07/2014 1414   AST 22 03/28/2015 1010   AST 16 03/07/2014 1414   ALT 15* 03/28/2015 1010   ALT 19 03/07/2014 1414   BILITOT 0.5 03/28/2015 1010   BILITOT 0.3 03/07/2014 1414       RADIOGRAPHIC STUDIES: I have personally reviewed the radiological images as listed and agreed with the findings in the report. No results found.   ASSESSMENT & PLAN:   # Prostate cancer recurrent ? Biochemical recurrence versus metastatic disease [no bone scans done]- on Lupron every 4 months. Most  recent PSA was 3.3. His last Lupron was in September 2016. PSA from today is pending.  # I would recommend holding Lupron today[ and in view of his severe osteoporosis-see discussion below.] A bone scan will help if patient has metastatic disease to the bones. There is data for intermittent androgen deprivation in nonmetastatic setting. This is especially in view of his age/sever osteoporois.   # Osteoporosis- Reviewed the bone density from 2015 - severe osteoporosis.  I would recommend Prolia every 6 months. Discussed the potential side effects of osteonecrosis of the jaw / hypocalcemia. Patient continued taking calcium and vitamin D pills.   # Patient follow-up with me in approximately 3 months labs; Possible Lupron; we'll also add testosterone/PSA at the time.  # 25 minutes face-to-face with the patient discussing the above plan of care; more than 50% of time spent on prognosis/ natural history; counseling and coordination.    Orders Placed This Encounter  Procedures  . NM Bone Scan Whole Body    Standing Status: Future     Number of Occurrences:      Standing Expiration Date: 05/27/2016    Order Specific Question:  Reason for Exam (SYMPTOM  OR DIAGNOSIS REQUIRED)    Answer:  prostate cancer    Order Specific Question:  Preferred imaging location?    Answer:  [facility]        Cammie Sickle, MD 03/28/2015 11:13 AM

## 2015-03-29 DIAGNOSIS — M25512 Pain in left shoulder: Secondary | ICD-10-CM | POA: Diagnosis not present

## 2015-03-29 DIAGNOSIS — M25511 Pain in right shoulder: Secondary | ICD-10-CM | POA: Diagnosis not present

## 2015-03-29 DIAGNOSIS — M6281 Muscle weakness (generalized): Secondary | ICD-10-CM | POA: Diagnosis not present

## 2015-03-30 DIAGNOSIS — M25511 Pain in right shoulder: Secondary | ICD-10-CM | POA: Diagnosis not present

## 2015-03-30 DIAGNOSIS — M6281 Muscle weakness (generalized): Secondary | ICD-10-CM | POA: Diagnosis not present

## 2015-03-30 DIAGNOSIS — M25512 Pain in left shoulder: Secondary | ICD-10-CM | POA: Diagnosis not present

## 2015-04-02 DIAGNOSIS — M6281 Muscle weakness (generalized): Secondary | ICD-10-CM | POA: Diagnosis not present

## 2015-04-02 DIAGNOSIS — M25511 Pain in right shoulder: Secondary | ICD-10-CM | POA: Diagnosis not present

## 2015-04-02 DIAGNOSIS — M25512 Pain in left shoulder: Secondary | ICD-10-CM | POA: Diagnosis not present

## 2015-04-04 DIAGNOSIS — M25511 Pain in right shoulder: Secondary | ICD-10-CM | POA: Diagnosis not present

## 2015-04-04 DIAGNOSIS — M25512 Pain in left shoulder: Secondary | ICD-10-CM | POA: Diagnosis not present

## 2015-04-04 DIAGNOSIS — M6281 Muscle weakness (generalized): Secondary | ICD-10-CM | POA: Diagnosis not present

## 2015-04-06 DIAGNOSIS — M25512 Pain in left shoulder: Secondary | ICD-10-CM | POA: Diagnosis not present

## 2015-04-06 DIAGNOSIS — M25511 Pain in right shoulder: Secondary | ICD-10-CM | POA: Diagnosis not present

## 2015-04-06 DIAGNOSIS — M6281 Muscle weakness (generalized): Secondary | ICD-10-CM | POA: Diagnosis not present

## 2015-04-09 DIAGNOSIS — M6281 Muscle weakness (generalized): Secondary | ICD-10-CM | POA: Diagnosis not present

## 2015-04-09 DIAGNOSIS — M25511 Pain in right shoulder: Secondary | ICD-10-CM | POA: Diagnosis not present

## 2015-04-09 DIAGNOSIS — M25512 Pain in left shoulder: Secondary | ICD-10-CM | POA: Diagnosis not present

## 2015-04-11 DIAGNOSIS — M25511 Pain in right shoulder: Secondary | ICD-10-CM | POA: Diagnosis not present

## 2015-04-11 DIAGNOSIS — M25512 Pain in left shoulder: Secondary | ICD-10-CM | POA: Diagnosis not present

## 2015-04-11 DIAGNOSIS — M6281 Muscle weakness (generalized): Secondary | ICD-10-CM | POA: Diagnosis not present

## 2015-04-13 DIAGNOSIS — M25512 Pain in left shoulder: Secondary | ICD-10-CM | POA: Diagnosis not present

## 2015-04-13 DIAGNOSIS — M6281 Muscle weakness (generalized): Secondary | ICD-10-CM | POA: Diagnosis not present

## 2015-04-13 DIAGNOSIS — M25511 Pain in right shoulder: Secondary | ICD-10-CM | POA: Diagnosis not present

## 2015-04-17 ENCOUNTER — Other Ambulatory Visit: Payer: Self-pay | Admitting: Internal Medicine

## 2015-04-18 ENCOUNTER — Encounter
Admission: RE | Admit: 2015-04-18 | Discharge: 2015-04-18 | Disposition: A | Discharge status: Home / Self Care | Payer: Medicare Other | Source: Ambulatory Visit | Attending: Internal Medicine | Admitting: Internal Medicine

## 2015-04-18 DIAGNOSIS — M810 Age-related osteoporosis without current pathological fracture: Secondary | ICD-10-CM | POA: Diagnosis not present

## 2015-04-18 DIAGNOSIS — C61 Malignant neoplasm of prostate: Secondary | ICD-10-CM | POA: Diagnosis not present

## 2015-04-18 DIAGNOSIS — M25511 Pain in right shoulder: Secondary | ICD-10-CM | POA: Diagnosis not present

## 2015-04-18 MED ORDER — TECHNETIUM TC 99M MEDRONATE IV KIT
23.2700 | PACK | Freq: Once | INTRAVENOUS | Status: AC | PRN
  Administered 2015-04-18: 23.27 via INTRAVENOUS

## 2015-04-20 ENCOUNTER — Encounter: Payer: Self-pay | Admitting: Internal Medicine

## 2015-04-20 ENCOUNTER — Non-Acute Institutional Stay: Payer: Medicare Other | Admitting: Internal Medicine

## 2015-04-20 VITALS — BP 130/64 | HR 74 | Temp 98.0°F | Resp 16

## 2015-04-20 DIAGNOSIS — J069 Acute upper respiratory infection, unspecified: Secondary | ICD-10-CM | POA: Diagnosis not present

## 2015-04-20 DIAGNOSIS — J309 Allergic rhinitis, unspecified: Secondary | ICD-10-CM | POA: Diagnosis not present

## 2015-04-20 NOTE — Progress Notes (Signed)
Subjective:    Patient ID: Benjamin Villarreal, male    DOB: Feb 15, 1921, 82 y.o.   MRN: [id number]  HPI  Asked to evaluate resident in [address] Headache, nasal congestion and cough, started 1 week ago. Not blowing anything out of his nose. Coughing up clear mucous.  Low grade fever over the weekend, none now No shortness of breath or chest pressure Has been taking Claritin, Flonase and Tylenol Symptoms have improved, has had sick contacts.  Review of Systems  Past Medical History  Diagnosis Date  . Osteoporosis 01/2014    T -3.8 (01/2014)  . Arthritis   . Prostate cancer ([place])     surgery then lupron  . Colon polyps     adenomatous  . Other constipation     chronic  . Unspecified venous (peripheral) insufficiency   . Vitamin B12 deficiency     Current Outpatient Prescriptions  Medication Sig Dispense Refill  . aspirin 81 MG EC tablet Take 81 mg by mouth every other day.     . Calcium Carbonate-Vitamin D (CALCIUM-D) 600-400 MG-UNIT TABS Take 1 tablet by mouth daily.    . cholecalciferol (VITAMIN D) 1000 UNITS tablet Take 1,000 Units by mouth daily.      . ciclopirox (LOPROX) 0.77 % cream Apply topically 2 (two) times daily.      . clindamycin (CLEOCIN) 150 MG capsule Take 150 mg by mouth. TAKES 1 HOUR PRIOR TO DENTAL WORK    . cyclobenzaprine (FLEXERIL) 5 MG tablet Take 1 tablet (5 mg total) by mouth at bedtime as needed. 90 tablet 3  . fluticasone (FLONASE) 50 MCG/ACT nasal spray Place 2 sprays into both nostrils daily. In each nostril 48 g 3  . glucosamine-chondroitin 500-400 MG tablet Take 3 tablets by mouth daily.     Marland Kitchen ibuprofen (ADVIL,MOTRIN) 200 MG tablet Take 800 mg by mouth daily as needed.    Marland Kitchen leuprolide (LUPRON) 30 MG injection Inject 30 mg into the muscle every 4 (four) months. Dose and interval not clear yet    . loratadine (CLARITIN) 10 MG tablet TAKE 1 TABLET DAILY 90 tablet 1  . mineral oil-hydrophilic petrolatum (AQUAPHOR) ointment Apply 1 application topically.  APPLY 1 APPLICATION ON LEFT BIG TOE DAILY    . polyethylene glycol powder (GLYCOLAX/MIRALAX) powder FILL TO LINE (17 GRAMS ), MIX AND DRINK TWICE A DAY AND 8.5 GRAMS  AT NOON 527 g 2  . sennosides-docusate sodium (SENOKOT-S) 8.6-50 MG tablet Take 4 tablets by mouth daily.    . traMADol (ULTRAM) 50 MG tablet Take 1 tablet (50 mg total) by mouth 3 (three) times daily as needed. 270 tablet 0  . triamcinolone cream (KENALOG) 0.1 % Apply topically 2 (two) times daily.    . vitamin B-12 (CYANOCOBALAMIN) 1000 MCG tablet Take 1 tablet (1,000 mcg total) by mouth daily. 90 tablet 3  . vitamin C (ASCORBIC ACID) 500 MG tablet Take 500 mg by mouth daily.    . zoledronic acid (RECLAST) 5 MG/100ML SOLN injection Inject 5 mg into the vein once. ?yearly     No current facility-administered medications for this visit.   Facility-Administered Medications Ordered in Other Visits  Medication Dose Route Frequency Provider Last Rate Last Dose  . leuprolide (LUPRON) injection 30 mg  30 mg Intramuscular Once Leia Alf, MD        Allergies  Allergen Reactions  . Other     RAW ONIONS- SINUS REACTION    Family History  Problem Relation Age of  Onset  . Diabetes Son   . Hypertension Paternal Grandfather     Social History   Social History  . Marital Status: Widowed    Spouse Name: [name]  . Number of Children: 3  . Years of Education: N/A   Occupational History  . [employer]     retired   Social History Main Topics  . Smoking status: Never Smoker   . Smokeless tobacco: Never Used  . Alcohol Use: 0.0 - 0.6 oz/week    0-1 Standard drinks or equivalent per week     Comment: in past  . Drug Use: Not on file  . Sexual Activity: Not on file   Other Topics Concern  . Not on file   Social History Narrative   Has living will   Daughter [name] holds health care POA.   Has DNR and we have reviewed this.   Would not want tube feedings if cognitively unaware     Constitutional: Pt  reports fever and headache. Denies malaise, fatigue, or abrupt weight changes.  HEENT: Pt reports nasal congestion. Denies eye pain, eye redness, ear pain, ringing in the ears, wax buildup, runny nose, bloody nose, or sore throat. Respiratory: Pt reports cough. Denies difficulty breathing, shortness of breath,  or sputum production.   Cardiovascular: Denies chest pain, chest tightness, palpitations or swelling in the hands or feet.   No other specific complaints in a complete review of systems (except as listed in HPI above).     Objective:   Physical Exam  BP 130/64 mmHg  Pulse 74  Temp(Src) 98 F (36.7 C)  Resp 16  SpO2 97% Wt Readings from Last 3 Encounters:  03/28/15 174 lb 9.7 oz (79.2 kg)  03/11/15 171 lb (77.565 kg)  02/08/15 176 lb (79.833 kg)    General: Appears his stated age,  in NAD. HEENT: Head: normal shape and size, no sinus tenderness noted; Eyes: sclera white, no icterus, conjunctiva pink; Throat/Mouth: Teeth present, mucosa pink and moist, no exudate, lesions or ulcerations noted.  Neck:  No adenopathy noted. Cardiovascular: Normal rate and rhythm. S1,S2 noted.  No murmur, rubs or gallops noted.  Pulmonary/Chest: Normal effort and positive vesicular breath sounds. No respiratory distress. No wheezes, rales or ronchi noted.    BMET    Component Value Date/Time   NA 137 03/11/2015 0850   NA 140 02/11/2012 0415   K 4.5 03/11/2015 0850   K 3.9 02/11/2012 0415   CL 107 03/11/2015 0850   CL 108* 02/11/2012 0415   CO2 23 03/11/2015 0850   CO2 25 02/11/2012 0415   GLUCOSE 112* 03/11/2015 0850   GLUCOSE 125* 02/11/2012 0415   BUN 16 03/11/2015 0850   BUN 12 02/11/2012 0415   CREATININE 0.86 03/28/2015 1010   CREATININE 1.00 03/23/2014 1400   CALCIUM 8.7* 03/28/2015 1010   CALCIUM 8.3* 03/23/2014 1400   GFRNONAA >60 03/28/2015 1010   GFRNONAA >60 03/23/2014 1400   GFRNONAA 49* 09/02/2013 1428   GFRAA >60 03/28/2015 1010   GFRAA >60 03/23/2014 1400   GFRAA  57* 09/02/2013 1428    Lipid Panel  No results found for: CHOL, TRIG, HDL, CHOLHDL, VLDL, LDLCALC  CBC    Component Value Date/Time   WBC 5.8 03/28/2015 1010   WBC 5.6 03/07/2014 1414   RBC 4.08* 03/28/2015 1010   RBC 4.05* 03/07/2014 1414   HGB 11.4* 03/28/2015 1010   HGB 11.3* 03/07/2014 1414   HCT 34.0* 03/28/2015 1010   HCT  34.2* 03/07/2014 1414   PLT 156 03/28/2015 1010   PLT 143* 03/07/2014 1414   MCV 83.4 03/28/2015 1010   MCV 84 03/07/2014 1414   MCH 28.0 03/28/2015 1010   MCH 27.8 03/07/2014 1414   MCHC 33.6 03/28/2015 1010   MCHC 33.0 03/07/2014 1414   RDW 15.4* 03/28/2015 1010   RDW 15.2* 03/07/2014 1414   LYMPHSABS 1.7 03/28/2015 1010   LYMPHSABS 1.8 03/07/2014 1414   MONOABS 0.5 03/28/2015 1010   MONOABS 0.5 03/07/2014 1414   EOSABS 0.4 03/28/2015 1010   EOSABS 0.3 03/07/2014 1414   BASOSABS 0.1 03/28/2015 1010   BASOSABS 0.2* 03/07/2014 1414    Hgb A1C No results found for: HGBA1C       Assessment & Plan:  Viral URI versus allergy:  Seems to be resolving Continue supportive care with Claritin, Flonase and Tylenol, rest and fluids Will continue to monitor  Will reasses as needed

## 2015-04-24 ENCOUNTER — Encounter: Payer: Self-pay | Admitting: Sports Medicine

## 2015-04-24 ENCOUNTER — Ambulatory Visit (INDEPENDENT_AMBULATORY_CARE_PROVIDER_SITE_OTHER): Payer: Medicare Other | Admitting: Sports Medicine

## 2015-04-24 DIAGNOSIS — B353 Tinea pedis: Secondary | ICD-10-CM

## 2015-04-24 DIAGNOSIS — M79676 Pain in unspecified toe(s): Secondary | ICD-10-CM

## 2015-04-24 DIAGNOSIS — I739 Peripheral vascular disease, unspecified: Secondary | ICD-10-CM | POA: Diagnosis not present

## 2015-04-24 DIAGNOSIS — B351 Tinea unguium: Secondary | ICD-10-CM | POA: Diagnosis not present

## 2015-04-24 NOTE — Progress Notes (Signed)
Patient ID: Benjamin Villarreal, male   DOB: 1921-02-05, 82 y.o.   MRN: [id number]   Subjective: Benjamin Villarreal is a 82 y.o. male patient seen today in office with complaint of painful thickened and elongated toenails; unable to trim. Has been using triamcinolone cream and Eucerin with resolution in tinea especially at ankles. Patient denies history of Diabetes or Neuropathy. Patient has no other pedal complaints at this time.   Patient Active Problem List   Diagnosis Date Noted  . Strain of shoulder 02/08/2015  . Anemia of chronic disease 10/19/2014  . Thrombocytopenia ([place]) 07/13/2014  . Tachycardia 02/02/2014  . 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 Allergic rhinitis due to pollen 10/21/2012  . VITAMIN B12 DEFICIENCY 12/13/2009  . Prostate cancer ([place]) 03/22/2009  . Chronic venous insufficiency 09/07/2008  . CONSTIPATION, CHRONIC 09/07/2008  . Osteoarthritis, multiple sites 09/05/2008  . Osteoporosis 09/05/2008   Current Outpatient Prescriptions on File Prior to Visit  Medication Sig Dispense Refill  . aspirin 81 MG EC tablet Take 81 mg by mouth every other day.     . Calcium Carbonate-Vitamin D (CALCIUM-D) 600-400 MG-UNIT TABS Take 1 tablet by mouth daily.    . cholecalciferol (VITAMIN D) 1000 UNITS tablet Take 1,000 Units by mouth daily.      . ciclopirox (LOPROX) 0.77 % cream Apply topically 2 (two) times daily.      . clindamycin (CLEOCIN) 150 MG capsule Take 150 mg by mouth. TAKES 1 HOUR PRIOR TO DENTAL WORK    . cyclobenzaprine (FLEXERIL) 5 MG tablet Take 1 tablet (5 mg total) by mouth at bedtime as needed. 90 tablet 3  . fluticasone (FLONASE) 50 MCG/ACT nasal spray Place 2 sprays into both nostrils daily. In each nostril 48 g 3  . glucosamine-chondroitin 500-400 MG tablet Take 3 tablets by mouth daily.     Marland Kitchen ibuprofen (ADVIL,MOTRIN) 200 MG tablet Take 800 mg by mouth daily as needed.    Marland Kitchen leuprolide (LUPRON) 30 MG injection Inject 30 mg into the muscle every 4 (four) months. Dose and  interval not clear yet    . loratadine (CLARITIN) 10 MG tablet TAKE 1 TABLET DAILY 90 tablet 1  . mineral oil-hydrophilic petrolatum (AQUAPHOR) ointment Apply 1 application topically. APPLY 1 APPLICATION ON LEFT BIG TOE DAILY    . polyethylene glycol powder (GLYCOLAX/MIRALAX) powder FILL TO LINE (17 GRAMS ), MIX AND DRINK TWICE A DAY AND 8.5 GRAMS  AT NOON 527 g 2  . sennosides-docusate sodium (SENOKOT-S) 8.6-50 MG tablet Take 4 tablets by mouth daily.    . traMADol (ULTRAM) 50 MG tablet Take 1 tablet (50 mg total) by mouth 3 (three) times daily as needed. 270 tablet 0  . triamcinolone cream (KENALOG) 0.1 % Apply topically 2 (two) times daily.    . vitamin B-12 (CYANOCOBALAMIN) 1000 MCG tablet Take 1 tablet (1,000 mcg total) by mouth daily. 90 tablet 3  . vitamin C (ASCORBIC ACID) 500 MG tablet Take 500 mg by mouth daily.    . zoledronic acid (RECLAST) 5 MG/100ML SOLN injection Inject 5 mg into the vein once. ?yearly     Current Facility-Administered Medications on File Prior to Visit  Medication Dose Route Frequency Provider Last Rate Last Dose  . leuprolide (LUPRON) injection 30 mg  30 mg Intramuscular Once Leia Alf, MD       Allergies  Allergen Reactions  . Other     RAW ONIONS- SINUS REACTION       Objective: Physical Exam  General:  Well developed, nourished, no acute distress, awake, alert and oriented x 3. Rolling walker assisted gait.   Vascular: Dorsalis pedis artery 1/4 bilateral, Posterior tibial artery 1/4 bilateral, skin temperature warm to warm proximal to distal bilateral lower extremities, + varicosities and hemosiderin hyperpigmentation, no edema, no pedal hair present bilateral.  Neurological: Gross sensation present via light touch bilateral.   Dermatological: Skin is warm, dry, and supple bilateral, Nails 1-10 are tender, long, thick, and discolored with mild subungal debris, no webspace macerations present bilateral, no open lesions present bilateral, no  signs of tinea, no callus/corns/hyperkeratotic tissue present bilateral. No signs of infection bilateral.  Musculoskeletal: No gross boney deformities noted bilateral. Muscular strength within normal limits without pain or limitation on range of motion. No pain with calf compression bilateral.  Assessment and Plan:  Problem List Items Addressed This Visit    None    Visit Diagnoses    Dermatophytosis of nail    -  Primary    PVD (peripheral vascular disease) ([place])        Pain of toe, unspecified laterality        Tinea pedis of both feet        Resolved      -Examined patient.  -Discussed treatment options for painful mycotic nails. -Mechanically debrided and reduced mycotic nails with sterile nail nipper and dremel nail file without incident. -Cont with Triamcinolone and Eucerin as Rx as needed; continues to be resolved at this time -Recommend good supportive shoes daily and ambulation with assistance of rolling walker for stability.  -Patient to return in 3 months for follow up evaluation or sooner if symptoms worsen.  Landis Martins, DPM

## 2015-04-25 DIAGNOSIS — R278 Other lack of coordination: Secondary | ICD-10-CM | POA: Diagnosis not present

## 2015-04-25 DIAGNOSIS — M79642 Pain in left hand: Secondary | ICD-10-CM | POA: Diagnosis not present

## 2015-04-25 DIAGNOSIS — M79641 Pain in right hand: Secondary | ICD-10-CM | POA: Diagnosis not present

## 2015-04-25 DIAGNOSIS — M6281 Muscle weakness (generalized): Secondary | ICD-10-CM | POA: Diagnosis not present

## 2015-04-25 DIAGNOSIS — M25511 Pain in right shoulder: Secondary | ICD-10-CM | POA: Diagnosis not present

## 2015-04-25 DIAGNOSIS — M25512 Pain in left shoulder: Secondary | ICD-10-CM | POA: Diagnosis not present

## 2015-04-26 DIAGNOSIS — M25512 Pain in left shoulder: Secondary | ICD-10-CM | POA: Diagnosis not present

## 2015-04-26 DIAGNOSIS — M79641 Pain in right hand: Secondary | ICD-10-CM | POA: Diagnosis not present

## 2015-04-26 DIAGNOSIS — M25511 Pain in right shoulder: Secondary | ICD-10-CM | POA: Diagnosis not present

## 2015-04-26 DIAGNOSIS — M79642 Pain in left hand: Secondary | ICD-10-CM | POA: Diagnosis not present

## 2015-04-26 DIAGNOSIS — M6281 Muscle weakness (generalized): Secondary | ICD-10-CM | POA: Diagnosis not present

## 2015-04-26 DIAGNOSIS — R278 Other lack of coordination: Secondary | ICD-10-CM | POA: Diagnosis not present

## 2015-04-27 DIAGNOSIS — M542 Cervicalgia: Secondary | ICD-10-CM | POA: Diagnosis not present

## 2015-04-27 DIAGNOSIS — M12811 Other specific arthropathies, not elsewhere classified, right shoulder: Secondary | ICD-10-CM | POA: Diagnosis not present

## 2015-04-30 ENCOUNTER — Telehealth: Payer: Self-pay

## 2015-04-30 DIAGNOSIS — M79641 Pain in right hand: Secondary | ICD-10-CM | POA: Diagnosis not present

## 2015-04-30 DIAGNOSIS — R278 Other lack of coordination: Secondary | ICD-10-CM | POA: Diagnosis not present

## 2015-04-30 DIAGNOSIS — M25512 Pain in left shoulder: Secondary | ICD-10-CM | POA: Diagnosis not present

## 2015-04-30 DIAGNOSIS — M79642 Pain in left hand: Secondary | ICD-10-CM | POA: Diagnosis not present

## 2015-04-30 DIAGNOSIS — M25511 Pain in right shoulder: Secondary | ICD-10-CM | POA: Diagnosis not present

## 2015-04-30 DIAGNOSIS — M6281 Muscle weakness (generalized): Secondary | ICD-10-CM | POA: Diagnosis not present

## 2015-04-30 NOTE — Telephone Encounter (Signed)
Rx written. Please fax.

## 2015-04-30 NOTE — Telephone Encounter (Signed)
Faxed order to [place] at [facility]

## 2015-04-30 NOTE — Telephone Encounter (Signed)
Bethany PT asst at [facility] left v/m requesting order faxed to [phone] for OT for hand pain and range of motion for both hands. Affecting pt dressing himself and playing piano.

## 2015-05-02 DIAGNOSIS — M25512 Pain in left shoulder: Secondary | ICD-10-CM | POA: Diagnosis not present

## 2015-05-02 DIAGNOSIS — M6281 Muscle weakness (generalized): Secondary | ICD-10-CM | POA: Diagnosis not present

## 2015-05-02 DIAGNOSIS — M79641 Pain in right hand: Secondary | ICD-10-CM | POA: Diagnosis not present

## 2015-05-02 DIAGNOSIS — R278 Other lack of coordination: Secondary | ICD-10-CM | POA: Diagnosis not present

## 2015-05-02 DIAGNOSIS — M79642 Pain in left hand: Secondary | ICD-10-CM | POA: Diagnosis not present

## 2015-05-02 DIAGNOSIS — M25511 Pain in right shoulder: Secondary | ICD-10-CM | POA: Diagnosis not present

## 2015-05-04 DIAGNOSIS — M25511 Pain in right shoulder: Secondary | ICD-10-CM | POA: Diagnosis not present

## 2015-05-04 DIAGNOSIS — M79642 Pain in left hand: Secondary | ICD-10-CM | POA: Diagnosis not present

## 2015-05-04 DIAGNOSIS — M79641 Pain in right hand: Secondary | ICD-10-CM | POA: Diagnosis not present

## 2015-05-04 DIAGNOSIS — R278 Other lack of coordination: Secondary | ICD-10-CM | POA: Diagnosis not present

## 2015-05-04 DIAGNOSIS — M25512 Pain in left shoulder: Secondary | ICD-10-CM | POA: Diagnosis not present

## 2015-05-04 DIAGNOSIS — M6281 Muscle weakness (generalized): Secondary | ICD-10-CM | POA: Diagnosis not present

## 2015-05-04 NOTE — Telephone Encounter (Signed)
Left a message for Bethany to call me back. I have faxed the order to the number provided 3 times: 3-6, 3-7, and 3-9. I asked if she had a different number I could fax it to.

## 2015-05-04 NOTE — Telephone Encounter (Signed)
Bethany at [facility] left v/m; Romelle Starcher has not received the OT order; [facility] request faxed to [phone].

## 2015-05-07 DIAGNOSIS — M25511 Pain in right shoulder: Secondary | ICD-10-CM | POA: Diagnosis not present

## 2015-05-07 DIAGNOSIS — M79641 Pain in right hand: Secondary | ICD-10-CM | POA: Diagnosis not present

## 2015-05-07 DIAGNOSIS — R278 Other lack of coordination: Secondary | ICD-10-CM | POA: Diagnosis not present

## 2015-05-07 DIAGNOSIS — M79642 Pain in left hand: Secondary | ICD-10-CM | POA: Diagnosis not present

## 2015-05-07 DIAGNOSIS — M25512 Pain in left shoulder: Secondary | ICD-10-CM | POA: Diagnosis not present

## 2015-05-07 DIAGNOSIS — M6281 Muscle weakness (generalized): Secondary | ICD-10-CM | POA: Diagnosis not present

## 2015-05-07 NOTE — Telephone Encounter (Signed)
Left message asking them to call me back to let me know if they received it or not

## 2015-05-07 NOTE — Telephone Encounter (Signed)
Spoke to [place]. She said to fax it to [phone]. I have faxed it, again.

## 2015-05-08 DIAGNOSIS — M25511 Pain in right shoulder: Secondary | ICD-10-CM | POA: Diagnosis not present

## 2015-05-08 DIAGNOSIS — R278 Other lack of coordination: Secondary | ICD-10-CM | POA: Diagnosis not present

## 2015-05-08 DIAGNOSIS — M6281 Muscle weakness (generalized): Secondary | ICD-10-CM | POA: Diagnosis not present

## 2015-05-08 DIAGNOSIS — M79641 Pain in right hand: Secondary | ICD-10-CM | POA: Diagnosis not present

## 2015-05-08 DIAGNOSIS — M79642 Pain in left hand: Secondary | ICD-10-CM | POA: Diagnosis not present

## 2015-05-08 DIAGNOSIS — M25512 Pain in left shoulder: Secondary | ICD-10-CM | POA: Diagnosis not present

## 2015-05-11 DIAGNOSIS — M79641 Pain in right hand: Secondary | ICD-10-CM | POA: Diagnosis not present

## 2015-05-11 DIAGNOSIS — M6281 Muscle weakness (generalized): Secondary | ICD-10-CM | POA: Diagnosis not present

## 2015-05-11 DIAGNOSIS — M25512 Pain in left shoulder: Secondary | ICD-10-CM | POA: Diagnosis not present

## 2015-05-11 DIAGNOSIS — R278 Other lack of coordination: Secondary | ICD-10-CM | POA: Diagnosis not present

## 2015-05-11 DIAGNOSIS — M25511 Pain in right shoulder: Secondary | ICD-10-CM | POA: Diagnosis not present

## 2015-05-11 DIAGNOSIS — M79642 Pain in left hand: Secondary | ICD-10-CM | POA: Diagnosis not present

## 2015-05-14 DIAGNOSIS — M79641 Pain in right hand: Secondary | ICD-10-CM | POA: Diagnosis not present

## 2015-05-14 DIAGNOSIS — M6281 Muscle weakness (generalized): Secondary | ICD-10-CM | POA: Diagnosis not present

## 2015-05-14 DIAGNOSIS — M25511 Pain in right shoulder: Secondary | ICD-10-CM | POA: Diagnosis not present

## 2015-05-14 DIAGNOSIS — M79642 Pain in left hand: Secondary | ICD-10-CM | POA: Diagnosis not present

## 2015-05-14 DIAGNOSIS — R278 Other lack of coordination: Secondary | ICD-10-CM | POA: Diagnosis not present

## 2015-05-14 DIAGNOSIS — M25512 Pain in left shoulder: Secondary | ICD-10-CM | POA: Diagnosis not present

## 2015-05-15 DIAGNOSIS — M79642 Pain in left hand: Secondary | ICD-10-CM | POA: Diagnosis not present

## 2015-05-15 DIAGNOSIS — R278 Other lack of coordination: Secondary | ICD-10-CM | POA: Diagnosis not present

## 2015-05-15 DIAGNOSIS — M79641 Pain in right hand: Secondary | ICD-10-CM | POA: Diagnosis not present

## 2015-05-15 DIAGNOSIS — M25512 Pain in left shoulder: Secondary | ICD-10-CM | POA: Diagnosis not present

## 2015-05-15 DIAGNOSIS — M6281 Muscle weakness (generalized): Secondary | ICD-10-CM | POA: Diagnosis not present

## 2015-05-15 DIAGNOSIS — M25511 Pain in right shoulder: Secondary | ICD-10-CM | POA: Diagnosis not present

## 2015-05-18 DIAGNOSIS — M25512 Pain in left shoulder: Secondary | ICD-10-CM | POA: Diagnosis not present

## 2015-05-18 DIAGNOSIS — M79641 Pain in right hand: Secondary | ICD-10-CM | POA: Diagnosis not present

## 2015-05-18 DIAGNOSIS — M6281 Muscle weakness (generalized): Secondary | ICD-10-CM | POA: Diagnosis not present

## 2015-05-18 DIAGNOSIS — M79642 Pain in left hand: Secondary | ICD-10-CM | POA: Diagnosis not present

## 2015-05-18 DIAGNOSIS — R278 Other lack of coordination: Secondary | ICD-10-CM | POA: Diagnosis not present

## 2015-05-18 DIAGNOSIS — M25511 Pain in right shoulder: Secondary | ICD-10-CM | POA: Diagnosis not present

## 2015-05-21 DIAGNOSIS — R278 Other lack of coordination: Secondary | ICD-10-CM | POA: Diagnosis not present

## 2015-05-21 DIAGNOSIS — M25511 Pain in right shoulder: Secondary | ICD-10-CM | POA: Diagnosis not present

## 2015-05-21 DIAGNOSIS — M79641 Pain in right hand: Secondary | ICD-10-CM | POA: Diagnosis not present

## 2015-05-21 DIAGNOSIS — M79642 Pain in left hand: Secondary | ICD-10-CM | POA: Diagnosis not present

## 2015-05-21 DIAGNOSIS — M25512 Pain in left shoulder: Secondary | ICD-10-CM | POA: Diagnosis not present

## 2015-05-21 DIAGNOSIS — M6281 Muscle weakness (generalized): Secondary | ICD-10-CM | POA: Diagnosis not present

## 2015-05-22 DIAGNOSIS — R278 Other lack of coordination: Secondary | ICD-10-CM | POA: Diagnosis not present

## 2015-05-22 DIAGNOSIS — M6281 Muscle weakness (generalized): Secondary | ICD-10-CM | POA: Diagnosis not present

## 2015-05-22 DIAGNOSIS — M79641 Pain in right hand: Secondary | ICD-10-CM | POA: Diagnosis not present

## 2015-05-22 DIAGNOSIS — M25511 Pain in right shoulder: Secondary | ICD-10-CM | POA: Diagnosis not present

## 2015-05-22 DIAGNOSIS — M25512 Pain in left shoulder: Secondary | ICD-10-CM | POA: Diagnosis not present

## 2015-05-22 DIAGNOSIS — M79642 Pain in left hand: Secondary | ICD-10-CM | POA: Diagnosis not present

## 2015-05-23 DIAGNOSIS — M6281 Muscle weakness (generalized): Secondary | ICD-10-CM | POA: Diagnosis not present

## 2015-05-23 DIAGNOSIS — M79642 Pain in left hand: Secondary | ICD-10-CM | POA: Diagnosis not present

## 2015-05-23 DIAGNOSIS — R278 Other lack of coordination: Secondary | ICD-10-CM | POA: Diagnosis not present

## 2015-05-23 DIAGNOSIS — M25511 Pain in right shoulder: Secondary | ICD-10-CM | POA: Diagnosis not present

## 2015-05-23 DIAGNOSIS — M25512 Pain in left shoulder: Secondary | ICD-10-CM | POA: Diagnosis not present

## 2015-05-23 DIAGNOSIS — M79641 Pain in right hand: Secondary | ICD-10-CM | POA: Diagnosis not present

## 2015-05-30 DIAGNOSIS — M79642 Pain in left hand: Secondary | ICD-10-CM | POA: Diagnosis not present

## 2015-05-30 DIAGNOSIS — M79641 Pain in right hand: Secondary | ICD-10-CM | POA: Diagnosis not present

## 2015-05-30 DIAGNOSIS — R278 Other lack of coordination: Secondary | ICD-10-CM | POA: Diagnosis not present

## 2015-05-31 ENCOUNTER — Encounter: Payer: Medicare Other | Admitting: Internal Medicine

## 2015-05-31 DIAGNOSIS — H40003 Preglaucoma, unspecified, bilateral: Secondary | ICD-10-CM | POA: Diagnosis not present

## 2015-06-01 ENCOUNTER — Other Ambulatory Visit: Payer: Self-pay | Admitting: Internal Medicine

## 2015-06-04 ENCOUNTER — Encounter: Payer: Self-pay | Admitting: Internal Medicine

## 2015-06-04 ENCOUNTER — Ambulatory Visit (INDEPENDENT_AMBULATORY_CARE_PROVIDER_SITE_OTHER): Payer: Medicare Other | Admitting: Internal Medicine

## 2015-06-04 ENCOUNTER — Ambulatory Visit (INDEPENDENT_AMBULATORY_CARE_PROVIDER_SITE_OTHER)
Admission: RE | Admit: 2015-06-04 | Discharge: 2015-06-04 | Disposition: A | Discharge status: Home / Self Care | Payer: Medicare Other | Source: Ambulatory Visit | Attending: Internal Medicine | Admitting: Internal Medicine

## 2015-06-04 VITALS — BP 118/60 | HR 60 | Temp 97.7°F | Resp 28 | Wt 170.0 lb

## 2015-06-04 DIAGNOSIS — M810 Age-related osteoporosis without current pathological fracture: Secondary | ICD-10-CM | POA: Diagnosis not present

## 2015-06-04 DIAGNOSIS — C61 Malignant neoplasm of prostate: Secondary | ICD-10-CM | POA: Diagnosis not present

## 2015-06-04 DIAGNOSIS — R0602 Shortness of breath: Secondary | ICD-10-CM | POA: Diagnosis not present

## 2015-06-04 NOTE — Progress Notes (Signed)
Pre visit review using our clinic review tool, if applicable. No additional management support is needed unless otherwise documented below in the visit note. 

## 2015-06-04 NOTE — Assessment & Plan Note (Signed)
PSA pretty good on the lupron

## 2015-06-04 NOTE — Assessment & Plan Note (Addendum)
Worrisome spell with hypotension during the night Nothing to suggest coronary ischemia Thinks he may have had nasal congestion--but probably doesn't explain entire spell Seems okay now and no sig preceding illness CXR doesn't seem any different than January--- just osteoporosis (will await the radiology overread) He will start the breathe right strips---as this may have been a smothering sensation due to nasal congestion

## 2015-06-04 NOTE — Progress Notes (Signed)
Subjective:    Patient ID: Benjamin Villarreal, male    DOB: 1921/02/04, 82 y.o.   MRN: [id number]  HPI Here due to spell of respiratory difficulty and hypotension this AM Rescue called---but felt better by then, so didn't go to ER  Awoke in the night--having a hard time "getting enough oxygen"--trouble breathing Was close to panic due to this Has had sinus drainage and felt congested Considering a vaporizer and nasal strips to open nostrils  Hasn't felt ill Some regular cough though---dry No prior SOB No dizziness or syncope Has had some left side chest pain--seems to be better when he adjusts his position(usually when he is in bed). Didn't have that this morning  Seems to be okay with his prostate Rx Recent PSA okay-- 3.2 lupron ~ every 3 months reclast for the osteoporosis  Current Outpatient Prescriptions on File Prior to Visit  Medication Sig Dispense Refill  . aspirin 81 MG EC tablet Take 81 mg by mouth every other day.     . Calcium Carbonate-Vitamin D (CALCIUM-D) 600-400 MG-UNIT TABS Take 1 tablet by mouth daily.    . cholecalciferol (VITAMIN D) 1000 UNITS tablet Take 1,000 Units by mouth daily.      . ciclopirox (LOPROX) 0.77 % cream Apply topically every other day. Alternating with Triamcinolone    . clindamycin (CLEOCIN) 150 MG capsule Take 150 mg by mouth. TAKES 1 HOUR PRIOR TO DENTAL WORK    . cyclobenzaprine (FLEXERIL) 5 MG tablet Take 1 tablet (5 mg total) by mouth at bedtime as needed. 90 tablet 3  . fluticasone (FLONASE) 50 MCG/ACT nasal spray Place 2 sprays into both nostrils daily. In each nostril 48 g 3  . glucosamine-chondroitin 500-400 MG tablet Take 3 tablets by mouth daily.     Marland Kitchen ibuprofen (ADVIL,MOTRIN) 200 MG tablet Take 800 mg by mouth daily as needed.    Marland Kitchen leuprolide (LUPRON) 30 MG injection Inject 30 mg into the muscle every 4 (four) months. Dose and interval not clear yet    . loratadine (CLARITIN) 10 MG tablet TAKE 1 TABLET DAILY 90 tablet 1  .  polyethylene glycol powder (GLYCOLAX/MIRALAX) powder FILL TO LINE (17 GRAMS ), MIX AND DRINK TWICE A DAY AND 8.5 GRAMS  AT NOON 527 g 2  . sennosides-docusate sodium (SENOKOT-S) 8.6-50 MG tablet Take 4 tablets by mouth daily.    . traMADol (ULTRAM) 50 MG tablet Take 1 tablet (50 mg total) by mouth 3 (three) times daily as needed. 270 tablet 0  . triamcinolone cream (KENALOG) 0.1 % Apply 1 application topically every other day. Alternating with Ciclopirox    . vitamin B-12 (CYANOCOBALAMIN) 1000 MCG tablet Take 1 tablet (1,000 mcg total) by mouth daily. 90 tablet 3  . vitamin C (ASCORBIC ACID) 500 MG tablet Take 500 mg by mouth daily.    . zoledronic acid (RECLAST) 5 MG/100ML SOLN injection Inject 5 mg into the vein once. ?yearly     Current Facility-Administered Medications on File Prior to Visit  Medication Dose Route Frequency Provider Last Rate Last Dose  . leuprolide (LUPRON) injection 30 mg  30 mg Intramuscular Once Leia Alf, MD        Allergies  Allergen Reactions  . Other     RAW ONIONS- SINUS REACTION    Past Medical History  Diagnosis Date  . Osteoporosis 01/2014    T -3.8 (01/2014)  . Arthritis   . Prostate cancer ([place])     surgery then lupron  . Colon  polyps     adenomatous  . Other constipation     chronic  . Unspecified venous (peripheral) insufficiency   . Vitamin B12 deficiency     Past Surgical History  Procedure Laterality Date  . Cataract extraction  2000, 2003    both eyes  . Basal cell carcinoma excision  2004    Left side of face  . Tibia fracture surgery  1999  . Prostate surgery  1996    cancer  . Volvulus reduction  12/10  . Joint replacement  2005    Left knee  . Joint replacement  12/13    Right knee    Family History  Problem Relation Age of Onset  . Diabetes Son   . Hypertension Paternal Grandfather     Social History   Social History  . Marital Status: Widowed    Spouse Name: [name]  . Number of Children: 3  . Years of  Education: N/A   Occupational History  . [employer]     retired   Social History Main Topics  . Smoking status: Never Smoker   . Smokeless tobacco: Never Used  . Alcohol Use: 0.0 - 0.6 oz/week    0-1 Standard drinks or equivalent per week     Comment: in past  . Drug Use: Not on file  . Sexual Activity: Not on file   Other Topics Concern  . Not on file   Social History Narrative   Has living will   Daughter [name] holds health care POA.   Has DNR and we have reviewed this.   Would not want tube feedings if cognitively unaware   Review of Systems Inconsistent with the aspirin Appetite has been okay    Objective:   Physical Exam  Constitutional: He appears well-developed and well-nourished. No distress.  Neck: Normal range of motion. Neck supple. No thyromegaly present.  Cardiovascular: Normal rate, regular rhythm and normal heart sounds.  Exam reveals no gallop.   No murmur heard. Pulmonary/Chest: Effort normal and breath sounds normal. No respiratory distress. He has no wheezes. He has no rales.  RR down to 22-24  Abdominal: Soft. There is no tenderness.  Musculoskeletal: He exhibits no edema.  Lymphadenopathy:    He has no cervical adenopathy.  Psychiatric: He has a normal mood and affect. His behavior is normal.          Assessment & Plan:

## 2015-06-04 NOTE — Assessment & Plan Note (Signed)
Getting reclast from oncologist

## 2015-06-06 DIAGNOSIS — H401122 Primary open-angle glaucoma, left eye, moderate stage: Secondary | ICD-10-CM | POA: Diagnosis not present

## 2015-06-20 ENCOUNTER — Non-Acute Institutional Stay: Payer: Medicare Other | Admitting: Internal Medicine

## 2015-06-20 ENCOUNTER — Encounter: Payer: Self-pay | Admitting: Internal Medicine

## 2015-06-20 VITALS — BP 118/70 | HR 74 | Temp 98.1°F | Resp 18

## 2015-06-20 DIAGNOSIS — M79675 Pain in left toe(s): Secondary | ICD-10-CM

## 2015-06-20 DIAGNOSIS — M7989 Other specified soft tissue disorders: Secondary | ICD-10-CM

## 2015-06-20 NOTE — Progress Notes (Signed)
Subjective:    Patient ID: Benjamin Villarreal, male    DOB: [birth year], 82 y.o.   MRN: [id number]  HPI  Asked to evaluate resindent with a 1 day h/o left great toe pain and redness. Pt states his left big toe was bothering him yesterday, but today he has no pain. Pt has swelling at the base of his toes on his left foot, which was not noted prior to yesterday. Pt denies trauma, falls or injury. Pt has not been elevating his foot.   Review of Systems  Past Medical History  Diagnosis Date  . Osteoporosis 01/2014    T -3.8 (01/2014)  . Arthritis   . Prostate cancer ([place])     surgery then lupron  . Colon polyps     adenomatous  . Other constipation     chronic  . Unspecified venous (peripheral) insufficiency   . Vitamin B12 deficiency     Current Outpatient Prescriptions  Medication Sig Dispense Refill  . aspirin 81 MG EC tablet Take 81 mg by mouth every other day.     . Calcium Carbonate-Vitamin D (CALCIUM-D) 600-400 MG-UNIT TABS Take 1 tablet by mouth daily.    . cholecalciferol (VITAMIN D) 1000 UNITS tablet Take 1,000 Units by mouth daily.      . ciclopirox (LOPROX) 0.77 % cream Apply topically every other day. Alternating with Triamcinolone    . clindamycin (CLEOCIN) 150 MG capsule Take 150 mg by mouth. TAKES 1 HOUR PRIOR TO DENTAL WORK    . cyclobenzaprine (FLEXERIL) 5 MG tablet Take 1 tablet (5 mg total) by mouth at bedtime as needed. 90 tablet 3  . fluticasone (FLONASE) 50 MCG/ACT nasal spray Place 2 sprays into both nostrils daily. In each nostril 48 g 3  . glucosamine-chondroitin 500-400 MG tablet Take 3 tablets by mouth daily.     Marland Kitchen ibuprofen (ADVIL,MOTRIN) 200 MG tablet Take 800 mg by mouth daily as needed.    Marland Kitchen leuprolide (LUPRON) 30 MG injection Inject 30 mg into the muscle every 4 (four) months. Dose and interval not clear yet    . loratadine (CLARITIN) 10 MG tablet TAKE 1 TABLET DAILY 90 tablet 1  . polyethylene glycol powder (GLYCOLAX/MIRALAX) powder FILL TO LINE (17 GRAMS  ), MIX AND DRINK TWICE A DAY AND 8.5 GRAMS  AT NOON 527 g 2  . sennosides-docusate sodium (SENOKOT-S) 8.6-50 MG tablet Take 4 tablets by mouth daily.    . Skin Protectants, Misc. (EUCERIN) cream Apply 1 application topically as needed for dry skin.    Marland Kitchen traMADol (ULTRAM) 50 MG tablet Take 1 tablet (50 mg total) by mouth 3 (three) times daily as needed. 270 tablet 0  . triamcinolone cream (KENALOG) 0.1 % Apply 1 application topically every other day. Alternating with Ciclopirox    . vitamin B-12 (CYANOCOBALAMIN) 1000 MCG tablet Take 1 tablet (1,000 mcg total) by mouth daily. 90 tablet 3  . vitamin C (ASCORBIC ACID) 500 MG tablet Take 500 mg by mouth daily.    . zoledronic acid (RECLAST) 5 MG/100ML SOLN injection Inject 5 mg into the vein once. ?yearly     No current facility-administered medications for this visit.   Facility-Administered Medications Ordered in Other Visits  Medication Dose Route Frequency Provider Last Rate Last Dose  . leuprolide (LUPRON) injection 30 mg  30 mg Intramuscular Once Leia Alf, MD        Allergies  Allergen Reactions  . Other     RAW ONIONS- SINUS REACTION  Family History  Problem Relation Age of Onset  . Diabetes Son   . Hypertension Paternal Grandfather     Social History   Social History  . Marital Status: Widowed    Spouse Name: [name]  . Number of Children: 3  . Years of Education: N/A   Occupational History  . [employer]     retired   Social History Main Topics  . Smoking status: Never Smoker   . Smokeless tobacco: Never Used  . Alcohol Use: 0.0 - 0.6 oz/week    0-1 Standard drinks or equivalent per week     Comment: in past  . Drug Use: Not on file  . Sexual Activity: Not on file   Other Topics Concern  . Not on file   Social History Narrative   Has living will   Daughter [name] holds health care POA.   Has DNR and we have reviewed this.   Would not want tube feedings if cognitively unaware       Extremities: Redness and pain of the big toe, swelling at the base of all toes of the L foot.  Denies numbness or tingling Skin: Denies rashes, lesions or ulcercations.   No other specific complaints in a complete review of systems (except as listed in HPI above).      Objective:   Physical Exam  There were no vitals taken for this visit. Wt Readings from Last 3 Encounters:  06/04/15 170 lb (77.111 kg)  03/28/15 174 lb 9.7 oz (79.2 kg)  03/11/15 171 lb (77.565 kg)    General: Appears his stated age, well developed, well nourished in NAD. Extremities: Redness just medial to the L big toe. No TTP of the foot, toes, or MTPs. Pt has limited flexion/extension ROM due to swelling at the base of all 5 toes of the left foot. Swelling does not go past the ball of the foot. Pt wearing compression socks that start compression at the arch of the foot.  +sensation in all toes. 2+ pedal pulse. No warmth or signs of infection.     BMET    Component Value Date/Time   NA 137 03/11/2015 0850   NA 140 02/11/2012 0415   K 4.5 03/11/2015 0850   K 3.9 02/11/2012 0415   CL 107 03/11/2015 0850   CL 108* 02/11/2012 0415   CO2 23 03/11/2015 0850   CO2 25 02/11/2012 0415   GLUCOSE 112* 03/11/2015 0850   GLUCOSE 125* 02/11/2012 0415   BUN 16 03/11/2015 0850   BUN 12 02/11/2012 0415   CREATININE 0.86 03/28/2015 1010   CREATININE 1.00 03/23/2014 1400   CALCIUM 8.7* 03/28/2015 1010   CALCIUM 8.3* 03/23/2014 1400   GFRNONAA >60 03/28/2015 1010   GFRNONAA >60 03/23/2014 1400   GFRNONAA 49* 09/02/2013 1428   GFRAA >60 03/28/2015 1010   GFRAA >60 03/23/2014 1400   GFRAA 57* 09/02/2013 1428    Lipid Panel  No results found for: CHOL, TRIG, HDL, CHOLHDL, VLDL, LDLCALC  CBC    Component Value Date/Time   WBC 5.8 03/28/2015 1010   WBC 5.6 03/07/2014 1414   RBC 4.08* 03/28/2015 1010   RBC 4.05* 03/07/2014 1414   HGB 11.4* 03/28/2015 1010   HGB 11.3* 03/07/2014 1414   HCT 34.0*  03/28/2015 1010   HCT 34.2* 03/07/2014 1414   PLT 156 03/28/2015 1010   PLT 143* 03/07/2014 1414   MCV 83.4 03/28/2015 1010   MCV 84 03/07/2014 1414   MCH 28.0  03/28/2015 1010   MCH 27.8 03/07/2014 1414   MCHC 33.6 03/28/2015 1010   MCHC 33.0 03/07/2014 1414   RDW 15.4* 03/28/2015 1010   RDW 15.2* 03/07/2014 1414   LYMPHSABS 1.7 03/28/2015 1010   LYMPHSABS 1.8 03/07/2014 1414   MONOABS 0.5 03/28/2015 1010   MONOABS 0.5 03/07/2014 1414   EOSABS 0.4 03/28/2015 1010   EOSABS 0.3 03/07/2014 1414   BASOSABS 0.1 03/28/2015 1010   BASOSABS 0.2* 03/07/2014 1414    Hgb A1C No results found for: HGBA1C       Assessment & Plan:   Left foot redness and swelling  Soak foot in Epsom salts Elevate leg  Notify if sxs worsen

## 2015-06-25 ENCOUNTER — Inpatient Hospital Stay: Payer: Medicare Other

## 2015-06-25 ENCOUNTER — Inpatient Hospital Stay (HOSPITAL_BASED_OUTPATIENT_CLINIC_OR_DEPARTMENT_OTHER): Payer: Medicare Other | Admitting: Internal Medicine

## 2015-06-25 ENCOUNTER — Inpatient Hospital Stay: Payer: Medicare Other | Attending: Internal Medicine

## 2015-06-25 VITALS — BP 133/70 | HR 59 | Temp 96.8°F | Resp 18 | Wt 171.5 lb

## 2015-06-25 DIAGNOSIS — M810 Age-related osteoporosis without current pathological fracture: Secondary | ICD-10-CM

## 2015-06-25 DIAGNOSIS — E538 Deficiency of other specified B group vitamins: Secondary | ICD-10-CM | POA: Insufficient documentation

## 2015-06-25 DIAGNOSIS — Z86010 Personal history of colonic polyps: Secondary | ICD-10-CM

## 2015-06-25 DIAGNOSIS — C61 Malignant neoplasm of prostate: Secondary | ICD-10-CM | POA: Diagnosis not present

## 2015-06-25 DIAGNOSIS — M129 Arthropathy, unspecified: Secondary | ICD-10-CM | POA: Diagnosis not present

## 2015-06-25 DIAGNOSIS — K5900 Constipation, unspecified: Secondary | ICD-10-CM | POA: Diagnosis not present

## 2015-06-25 DIAGNOSIS — I872 Venous insufficiency (chronic) (peripheral): Secondary | ICD-10-CM | POA: Insufficient documentation

## 2015-06-25 DIAGNOSIS — M818 Other osteoporosis without current pathological fracture: Secondary | ICD-10-CM

## 2015-06-25 DIAGNOSIS — Z7982 Long term (current) use of aspirin: Secondary | ICD-10-CM | POA: Diagnosis not present

## 2015-06-25 DIAGNOSIS — Z79899 Other long term (current) drug therapy: Secondary | ICD-10-CM | POA: Insufficient documentation

## 2015-06-25 LAB — COMPREHENSIVE METABOLIC PANEL
ALT: 13 U/L — AB (ref 17–63)
ANION GAP: 7 (ref 5–15)
AST: 27 U/L (ref 15–41)
Albumin: 4.1 g/dL (ref 3.5–5.0)
Alkaline Phosphatase: 60 U/L (ref 38–126)
BUN: 19 mg/dL (ref 6–20)
CHLORIDE: 108 mmol/L (ref 101–111)
CO2: 22 mmol/L (ref 22–32)
CREATININE: 0.93 mg/dL (ref 0.61–1.24)
Calcium: 9 mg/dL (ref 8.9–10.3)
Glucose, Bld: 162 mg/dL — ABNORMAL HIGH (ref 65–99)
Potassium: 4 mmol/L (ref 3.5–5.1)
Sodium: 137 mmol/L (ref 135–145)
Total Bilirubin: 0.7 mg/dL (ref 0.3–1.2)
Total Protein: 7.2 g/dL (ref 6.5–8.1)

## 2015-06-25 LAB — CBC WITH DIFFERENTIAL/PLATELET
Basophils Absolute: 0.1 10*3/uL (ref 0–0.1)
Basophils Relative: 2 %
EOS PCT: 8 %
Eosinophils Absolute: 0.5 10*3/uL (ref 0–0.7)
HCT: 34.8 % — ABNORMAL LOW (ref 40.0–52.0)
Hemoglobin: 11.6 g/dL — ABNORMAL LOW (ref 13.0–18.0)
LYMPHS ABS: 1.7 10*3/uL (ref 1.0–3.6)
LYMPHS PCT: 28 %
MCH: 27.8 pg (ref 26.0–34.0)
MCHC: 33.4 g/dL (ref 32.0–36.0)
MCV: 83.1 fL (ref 80.0–100.0)
MONO ABS: 0.5 10*3/uL (ref 0.2–1.0)
MONOS PCT: 9 %
Neutro Abs: 3.2 10*3/uL (ref 1.4–6.5)
Neutrophils Relative %: 53 %
PLATELETS: 157 10*3/uL (ref 150–440)
RBC: 4.19 MIL/uL — ABNORMAL LOW (ref 4.40–5.90)
RDW: 14.6 % — AB (ref 11.5–14.5)
WBC: 6 10*3/uL (ref 3.8–10.6)

## 2015-06-25 LAB — PSA: PSA: 2.5 ng/mL (ref 0.00–4.00)

## 2015-06-25 NOTE — Progress Notes (Signed)
[place] OFFICE PROGRESS NOTE  Patient Care Team: Venia Carbon, MD as PCP - General   SUMMARY OF ONCOLOGIC HISTORY:  # 1996 PROSTATE CANCER [Gleason score 2+3] s/p Radical Prostatectomy [[facility]]; ?Biochem RECURRENT PROSTATE CANCER-Hormone sensitive;  Intermittent Lupron; on Lupron q 471M [Jan 2016 PSA- 7.9].  HOLD LUPRON in MAY 2017   # Osteoporosis [[facility]MD- [id number]- on Reclast q 71M; FEB 2017- Start Prolia q 6 M  INTERVAL HISTORY:  A very pleasant 82 year old male patient with above history of prostate cancer recurrent on Lupron every 3 months/ Prolia q 71M is here for follow-up.  Patient continues to go on with a walker. He continues to live in the assisted living; is fairly active for his stage. Denies any unusual back pain. Appetite is fair. Not losing any weight. Denies any significant hot flashes.   REVIEW OF SYSTEMS:  A complete 10 point review of system is done which is negative except mentioned above/history of present illness.   PAST MEDICAL HISTORY :  Past Medical History  Diagnosis Date  . Osteoporosis 01/2014    T -3.8 (01/2014)  . Arthritis   . Prostate cancer ([place])     surgery then lupron  . Colon polyps     adenomatous  . Other constipation     chronic  . Unspecified venous (peripheral) insufficiency   . Vitamin B12 deficiency     PAST SURGICAL HISTORY :   Past Surgical History  Procedure Laterality Date  . Cataract extraction  2000, 2003    both eyes  . Basal cell carcinoma excision  2004    Left side of face  . Tibia fracture surgery  1999  . Prostate surgery  1996    cancer  . Volvulus reduction  12/10  . Joint replacement  2005    Left knee  . Joint replacement  12/13    Right knee    FAMILY HISTORY :   Family History  Problem Relation Age of Onset  . Diabetes Son   . Hypertension Paternal Grandfather     SOCIAL HISTORY:   Social History  Substance Use Topics  . Smoking status: Never Smoker   . Smokeless tobacco: Never  Used  . Alcohol Use: 0.0 - 0.6 oz/week    0-1 Standard drinks or equivalent per week     Comment: in past    ALLERGIES:  is allergic to other.  MEDICATIONS:  Current Outpatient Prescriptions  Medication Sig Dispense Refill  . aspirin 81 MG EC tablet Take 81 mg by mouth every other day.     . Calcium Carbonate-Vitamin D (CALCIUM-D) 600-400 MG-UNIT TABS Take 1 tablet by mouth daily.    . cholecalciferol (VITAMIN D) 1000 UNITS tablet Take 1,000 Units by mouth daily.      . ciclopirox (LOPROX) 0.77 % cream Apply topically every other day. Alternating with Triamcinolone    . clindamycin (CLEOCIN) 150 MG capsule Take 150 mg by mouth. TAKES 1 HOUR PRIOR TO DENTAL WORK    . cyclobenzaprine (FLEXERIL) 5 MG tablet Take 1 tablet (5 mg total) by mouth at bedtime as needed. 90 tablet 3  . fluticasone (FLONASE) 50 MCG/ACT nasal spray Place 2 sprays into both nostrils daily. In each nostril 48 g 3  . glucosamine-chondroitin 500-400 MG tablet Take 3 tablets by mouth daily.     Marland Kitchen ibuprofen (ADVIL,MOTRIN) 200 MG tablet Take 800 mg by mouth daily as needed.    Marland Kitchen leuprolide (LUPRON) 30 MG injection Inject  30 mg into the muscle every 4 (four) months. Dose and interval not clear yet    . loratadine (CLARITIN) 10 MG tablet TAKE 1 TABLET DAILY 90 tablet 1  . polyethylene glycol powder (GLYCOLAX/MIRALAX) powder FILL TO LINE (17 GRAMS ), MIX AND DRINK TWICE A DAY AND 8.5 GRAMS  AT NOON 527 g 2  . sennosides-docusate sodium (SENOKOT-S) 8.6-50 MG tablet Take 4 tablets by mouth daily.    . Skin Protectants, Misc. (EUCERIN) cream Apply 1 application topically as needed for dry skin.    Marland Kitchen traMADol (ULTRAM) 50 MG tablet Take 1 tablet (50 mg total) by mouth 3 (three) times daily as needed. 270 tablet 0  . triamcinolone cream (KENALOG) 0.1 % Apply 1 application topically every other day. Alternating with Ciclopirox    . vitamin B-12 (CYANOCOBALAMIN) 1000 MCG tablet Take 1 tablet (1,000 mcg total) by mouth daily. 90 tablet  3  . vitamin C (ASCORBIC ACID) 500 MG tablet Take 500 mg by mouth daily.    . zoledronic acid (RECLAST) 5 MG/100ML SOLN injection Inject 5 mg into the vein once. ?yearly    . ibuprofen (ADVIL,MOTRIN) 200 MG tablet Take by mouth.     No current facility-administered medications for this visit.   Facility-Administered Medications Ordered in Other Visits  Medication Dose Route Frequency Provider Last Rate Last Dose  . leuprolide (LUPRON) injection 30 mg  30 mg Intramuscular Once Leia Alf, MD        PHYSICAL EXAMINATION: ECOG PERFORMANCE STATUS: 1 - Symptomatic but completely ambulatory  BP 133/70 mmHg  Pulse 59  Temp(Src) 96.8 F (36 C)  Resp 18  Wt 171 lb 8.3 oz (77.8 kg)  Filed Weights   06/25/15 1036  Weight: 171 lb 8.3 oz (77.8 kg)    GENERAL: Well-nourished well-developed; Alert, no distress and comfortable.  Alone; walks with a rolling walker EYES: no pallor or icterus OROPHARYNX: no thrush or ulceration; good dentition  NECK: supple, no masses felt LYMPH:  no palpable lymphadenopathy in the cervical, axillary or inguinal regions LUNGS: clear to auscultation and  No wheeze or crackles HEART/CVS: regular rate & rhythm and no murmurs; No lower extremity edema ABDOMEN:abdomen soft, non-tender and normal bowel sounds Musculoskeletal:no cyanosis of digits and no clubbing  PSYCH: alert & oriented x 3 with fluent speech NEURO: no focal motor/sensory deficits SKIN:  no rashes or significant lesions  LABORATORY DATA:  I have reviewed the data as listed    Component Value Date/Time   NA 137 06/25/2015 1010   NA 140 02/11/2012 0415   K 4.0 06/25/2015 1010   K 3.9 02/11/2012 0415   CL 108 06/25/2015 1010   CL 108* 02/11/2012 0415   CO2 22 06/25/2015 1010   CO2 25 02/11/2012 0415   GLUCOSE 162* 06/25/2015 1010   GLUCOSE 125* 02/11/2012 0415   BUN 19 06/25/2015 1010   BUN 12 02/11/2012 0415   CREATININE 0.93 06/25/2015 1010   CREATININE 1.00 03/23/2014 1400    CALCIUM 9.0 06/25/2015 1010   CALCIUM 8.3* 03/23/2014 1400   PROT 7.2 06/25/2015 1010   PROT 7.0 03/07/2014 1414   ALBUMIN 4.1 06/25/2015 1010   ALBUMIN 3.7 03/07/2014 1414   AST 27 06/25/2015 1010   AST 16 03/07/2014 1414   ALT 13* 06/25/2015 1010   ALT 19 03/07/2014 1414   ALKPHOS 60 06/25/2015 1010   ALKPHOS 69 03/07/2014 1414   BILITOT 0.7 06/25/2015 1010   BILITOT 0.3 03/07/2014 1414   GFRNONAA >60  06/25/2015 1010   GFRNONAA >60 03/23/2014 1400   GFRNONAA 49* 09/02/2013 1428   GFRAA >60 06/25/2015 1010   GFRAA >60 03/23/2014 1400   GFRAA 57* 09/02/2013 1428    No results found for: SPEP, UPEP  Lab Results  Component Value Date   WBC 6.0 06/25/2015   NEUTROABS 3.2 06/25/2015   HGB 11.6* 06/25/2015   HCT 34.8* 06/25/2015   MCV 83.1 06/25/2015   PLT 157 06/25/2015      Chemistry      Component Value Date/Time   NA 137 06/25/2015 1010   NA 140 02/11/2012 0415   K 4.0 06/25/2015 1010   K 3.9 02/11/2012 0415   CL 108 06/25/2015 1010   CL 108* 02/11/2012 0415   CO2 22 06/25/2015 1010   CO2 25 02/11/2012 0415   BUN 19 06/25/2015 1010   BUN 12 02/11/2012 0415   CREATININE 0.93 06/25/2015 1010   CREATININE 1.00 03/23/2014 1400      Component Value Date/Time   CALCIUM 9.0 06/25/2015 1010   CALCIUM 8.3* 03/23/2014 1400   ALKPHOS 60 06/25/2015 1010   ALKPHOS 69 03/07/2014 1414   AST 27 06/25/2015 1010   AST 16 03/07/2014 1414   ALT 13* 06/25/2015 1010   ALT 19 03/07/2014 1414   BILITOT 0.7 06/25/2015 1010   BILITOT 0.3 03/07/2014 1414         ASSESSMENT & PLAN:   # Prostate cancer recurrent ? Biochemical recurrence versus metastatic disease [no bone scans done]- on Lupron every 3 months.  His last Lupron was in feb 1st 2017.  PSA  Feb  2017- 3.2. Clinically no evidence of any obvious progression.  # I would recommend holding Lupron today [in view of his severe osteoporosis-see discussion below] . Awaiting on the PSA/testosterone from today.  #  Osteoporosis- Reviewed the bone density from 2015 - severe osteoporosis.on Prolia q 6 M. Discussed the potential side effects of osteonecrosis of the jaw / hypocalcemia. Patient continued taking calcium and vitamin D pills.   # Patient follow-up with me in approximately 3 months labs-CBC/BMP testosterone PSA.; Possible Lupron; and [place], MD 06/25/2015 11:31 AM

## 2015-06-26 LAB — TESTOSTERONE: Testosterone: <3 ng/dL — ABNORMAL LOW (ref 348–1197)

## 2015-07-17 ENCOUNTER — Encounter: Payer: Self-pay | Admitting: Sports Medicine

## 2015-07-17 ENCOUNTER — Ambulatory Visit (INDEPENDENT_AMBULATORY_CARE_PROVIDER_SITE_OTHER): Payer: Medicare Other | Admitting: Sports Medicine

## 2015-07-17 DIAGNOSIS — I739 Peripheral vascular disease, unspecified: Secondary | ICD-10-CM

## 2015-07-17 DIAGNOSIS — M79676 Pain in unspecified toe(s): Secondary | ICD-10-CM

## 2015-07-17 DIAGNOSIS — B351 Tinea unguium: Secondary | ICD-10-CM | POA: Diagnosis not present

## 2015-07-17 NOTE — Progress Notes (Signed)
Patient ID: Benjamin Villarreal, male   DOB: November 22, 1920, 82 y.o.   MRN: [id number]  Subjective: Benjamin Villarreal is a 82 y.o. male patient seen today in office with complaint of painful thickened and elongated toenails; unable to trim. Has been using triamcinolone cream and Eucerin with resolution in tinea especially at ankles. Patient denies history of Diabetes or Neuropathy. Patient has no other pedal complaints at this time.   Patient Active Problem List   Diagnosis Date Noted  . SOB (shortness of breath) 06/04/2015  . Strain of shoulder 02/08/2015  . Anemia of chronic disease 10/19/2014  . Thrombocytopenia ([place]) 07/13/2014  . Tachycardia 02/02/2014  . 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 Allergic rhinitis due to pollen 10/21/2012  . VITAMIN B12 DEFICIENCY 12/13/2009  . Prostate cancer ([place]) 03/22/2009  . Chronic venous insufficiency 09/07/2008  . CONSTIPATION, CHRONIC 09/07/2008  . Osteoarthritis, multiple sites 09/05/2008  . Osteoporosis 09/05/2008   Current Outpatient Prescriptions on File Prior to Visit  Medication Sig Dispense Refill  . aspirin 81 MG EC tablet Take 81 mg by mouth every other day.     . Calcium Carbonate-Vitamin D (CALCIUM-D) 600-400 MG-UNIT TABS Take 1 tablet by mouth daily.    . cholecalciferol (VITAMIN D) 1000 UNITS tablet Take 1,000 Units by mouth daily.      . ciclopirox (LOPROX) 0.77 % cream Apply topically every other day. Alternating with Triamcinolone    . clindamycin (CLEOCIN) 150 MG capsule Take 150 mg by mouth. TAKES 1 HOUR PRIOR TO DENTAL WORK    . cyclobenzaprine (FLEXERIL) 5 MG tablet Take 1 tablet (5 mg total) by mouth at bedtime as needed. 90 tablet 3  . fluticasone (FLONASE) 50 MCG/ACT nasal spray Place 2 sprays into both nostrils daily. In each nostril 48 g 3  . glucosamine-chondroitin 500-400 MG tablet Take 3 tablets by mouth daily.     Marland Kitchen ibuprofen (ADVIL,MOTRIN) 200 MG tablet Take 800 mg by mouth daily as needed.    Marland Kitchen ibuprofen (ADVIL,MOTRIN) 200 MG  tablet Take by mouth.    Marland Kitchen leuprolide (LUPRON) 30 MG injection Inject 30 mg into the muscle every 4 (four) months. Dose and interval not clear yet    . loratadine (CLARITIN) 10 MG tablet TAKE 1 TABLET DAILY 90 tablet 1  . polyethylene glycol powder (GLYCOLAX/MIRALAX) powder FILL TO LINE (17 GRAMS ), MIX AND DRINK TWICE A DAY AND 8.5 GRAMS  AT NOON 527 g 2  . sennosides-docusate sodium (SENOKOT-S) 8.6-50 MG tablet Take 4 tablets by mouth daily.    . Skin Protectants, Misc. (EUCERIN) cream Apply 1 application topically as needed for dry skin.    Marland Kitchen traMADol (ULTRAM) 50 MG tablet Take 1 tablet (50 mg total) by mouth 3 (three) times daily as needed. 270 tablet 0  . triamcinolone cream (KENALOG) 0.1 % Apply 1 application topically every other day. Alternating with Ciclopirox    . vitamin B-12 (CYANOCOBALAMIN) 1000 MCG tablet Take 1 tablet (1,000 mcg total) by mouth daily. 90 tablet 3  . vitamin C (ASCORBIC ACID) 500 MG tablet Take 500 mg by mouth daily.    . zoledronic acid (RECLAST) 5 MG/100ML SOLN injection Inject 5 mg into the vein once. ?yearly     Current Facility-Administered Medications on File Prior to Visit  Medication Dose Route Frequency Provider Last Rate Last Dose  . leuprolide (LUPRON) injection 30 mg  30 mg Intramuscular Once Leia Alf, MD       Allergies  Allergen Reactions  . Other  RAW ONIONS- SINUS REACTION       Objective: Physical Exam  General: Well developed, nourished, no acute distress, awake, alert and oriented x 3. Rolling walker assisted gait.   Vascular: Dorsalis pedis artery 1/4 bilateral, Posterior tibial artery 1/4 bilateral, skin temperature warm to warm proximal to distal bilateral lower extremities, + varicosities and hemosiderin hyperpigmentation, no edema, no pedal hair present bilateral.  Neurological: Gross sensation present via light touch bilateral.   Dermatological: Skin is warm, dry, and supple bilateral, Nails 1-10 are tender, long,  thick, and discolored with mild subungal debris, no webspace macerations present bilateral, no open lesions present bilateral, no signs of tinea, no callus/corns/hyperkeratotic tissue present bilateral. No signs of infection bilateral.  Musculoskeletal: No gross boney deformities noted bilateral. Muscular strength within normal limits without pain or limitation on range of motion. No pain with calf compression bilateral.  Assessment and Plan:  Problem List Items Addressed This Visit    None    Visit Diagnoses    Dermatophytosis of nail    -  Primary    PVD (peripheral vascular disease) ([place])        Pain of toe, unspecified laterality          -Examined patient.  -Discussed treatment options for painful mycotic nails. -Mechanically debrided and reduced mycotic nails with sterile nail nipper and dremel nail file without incident. -Cont with Triamcinolone and Eucerin as Rx as needed; continues to be resolved at this time -Recommend good supportive shoes daily and ambulation with assistance of rolling walker for stability.  -Patient to return in 3 months for follow up evaluation or sooner if symptoms worsen.  Landis Martins, DPM

## 2015-07-18 ENCOUNTER — Non-Acute Institutional Stay: Payer: Medicare Other | Admitting: Internal Medicine

## 2015-07-18 ENCOUNTER — Encounter: Payer: Self-pay | Admitting: Internal Medicine

## 2015-07-18 VITALS — BP 122/74 | HR 68 | Temp 99.5°F | Resp 18

## 2015-07-18 DIAGNOSIS — J019 Acute sinusitis, unspecified: Secondary | ICD-10-CM | POA: Diagnosis not present

## 2015-07-18 DIAGNOSIS — B9689 Other specified bacterial agents as the cause of diseases classified elsewhere: Secondary | ICD-10-CM

## 2015-07-18 NOTE — Progress Notes (Signed)
HPI  Asked to evaluate resident in [address] 6 days of headache, runny nose, nasal congestion, sore throat and cough Blowing thick yellow mucous out of his nose. No difficulty swallowing. Cough non productive, not short of breath Started running fevers 2 days ago Taking Claritin, Flonase, Tussin and Tylenol He has not had sick contacts  Review of Systems    Past Medical History  Diagnosis Date  . Osteoporosis 01/2014    T -3.8 (01/2014)  . Arthritis   . Prostate cancer ([place])     surgery then lupron  . Colon polyps     adenomatous  . Other constipation     chronic  . Unspecified venous (peripheral) insufficiency   . Vitamin B12 deficiency     Family History  Problem Relation Age of Onset  . Diabetes Son   . Hypertension Paternal Grandfather     Social History   Social History  . Marital Status: Widowed    Spouse Name: [name]  . Number of Children: 3  . Years of Education: N/A   Occupational History  . [employer]     retired   Social History Main Topics  . Smoking status: Never Smoker   . Smokeless tobacco: Never Used  . Alcohol Use: 0.0 - 0.6 oz/week    0-1 Standard drinks or equivalent per week     Comment: in past  . Drug Use: Not on file  . Sexual Activity: Not on file   Other Topics Concern  . Not on file   Social History Narrative   Has living will   Daughter [name] holds health care POA.   Has DNR and we have reviewed this.   Would not want tube feedings if cognitively unaware    Allergies  Allergen Reactions  . Other     RAW ONIONS- SINUS REACTION     Constitutional: Positive headache, fatigue and fever. Denies abrupt weight changes.  HEENT:  Positive facial pain, nasal congestion and sore throat. Denies eye redness, ear pain, ringing in the ears, wax buildup, runny nose or bloody nose. Respiratory: Positive cough. Denies difficulty breathing or shortness of breath.  Cardiovascular: Denies chest pain, chest tightness,  palpitations or swelling in the hands or feet.   No other specific complaints in a complete review of systems (except as listed in HPI above).  Objective:  BP 122/74 mmHg  Pulse 68  Temp(Src) 99.5 F (37.5 C)  Resp 18   General: Appears his stated age, in NAD. HEENT: Head: normal shape and size, frontal sinus tenderness noted; Eyes: sclera white, no icterus, conjunctiva pink; Throat/Mouth: + PND. Teeth present, mucosa erythematous and moist, no exudate noted, no lesions or ulcerations noted.  Neck:  No  adenopathy noted.  Cardiovascular: Normal rate and rhythm.  Pulmonary/Chest: Normal effort and positive vesicular breath sounds. No respiratory distress. No wheezes, rales or ronchi noted.      Assessment & Plan:   Acute bacterial sinusitis  Continue Claritin, Flonase and Tussin Amoxil BID for 7 days  RTC as needed or if symptoms persist.

## 2015-07-18 NOTE — Patient Instructions (Signed)

## 2015-07-24 ENCOUNTER — Other Ambulatory Visit: Payer: Self-pay | Admitting: Family Medicine

## 2015-07-30 DIAGNOSIS — I10 Essential (primary) hypertension: Secondary | ICD-10-CM | POA: Diagnosis not present

## 2015-08-02 ENCOUNTER — Encounter: Payer: Self-pay | Admitting: Internal Medicine

## 2015-08-28 ENCOUNTER — Other Ambulatory Visit: Payer: Self-pay | Admitting: Internal Medicine

## 2015-09-27 ENCOUNTER — Encounter: Payer: Self-pay | Admitting: Internal Medicine

## 2015-09-27 ENCOUNTER — Non-Acute Institutional Stay: Payer: Medicare Other | Admitting: Internal Medicine

## 2015-09-27 DIAGNOSIS — K5909 Other constipation: Secondary | ICD-10-CM

## 2015-09-27 DIAGNOSIS — D696 Thrombocytopenia, unspecified: Secondary | ICD-10-CM | POA: Diagnosis not present

## 2015-09-27 DIAGNOSIS — C61 Malignant neoplasm of prostate: Secondary | ICD-10-CM

## 2015-09-27 DIAGNOSIS — G463 Brain stem stroke syndrome: Secondary | ICD-10-CM

## 2015-09-27 DIAGNOSIS — M810 Age-related osteoporosis without current pathological fracture: Secondary | ICD-10-CM

## 2015-09-27 NOTE — Assessment & Plan Note (Signed)
Recommended he stay on the aspirin Balance has been okay

## 2015-09-27 NOTE — Assessment & Plan Note (Signed)
Calcium/vitamin D and regular exercise May change to prolia (Dr B)

## 2015-09-27 NOTE — Assessment & Plan Note (Signed)
PSA down last time Hopefully can hold off on further lupron for the foreseeable future

## 2015-09-27 NOTE — Assessment & Plan Note (Signed)
Last couple normal ?easy bruising but no excessive bleeding, etc

## 2015-09-27 NOTE — Progress Notes (Signed)
Subjective:    Patient ID: Benjamin Villarreal, male    DOB: 09-Jul-1920, 82 y.o.   MRN: [id number]  HPI Visit for review of chronic medical conditions Reviewed status with Leafy Ro RN  Doing well Feels good most of the time Mostly pain free Satisfied with care here  Oncology visit reviewed Didn't get the lupron at last visit PSA was down some even so May start prolia (instead of reclast)  Leg swelling not an issue of late Right ankle tight at times--he does ROM exercises No problems walking--uses rollator all the time He remains independent with all ADLs---even showering  Bowels are doing okay Continues on miralax 2.5 doses a day and senna  Intermittent with the aspirin Tries to take it every other day No new balance issues or neuro symptoms  Current Outpatient Prescriptions on File Prior to Visit  Medication Sig Dispense Refill  . aspirin 81 MG EC tablet Take 81 mg by mouth every other day.     . Calcium Carbonate-Vitamin D (CALCIUM-D) 600-400 MG-UNIT TABS Take 1 tablet by mouth daily.    . cholecalciferol (VITAMIN D) 1000 UNITS tablet Take 1,000 Units by mouth daily.      . ciclopirox (LOPROX) 0.77 % cream Apply topically every other day. Alternating with Triamcinolone    . clindamycin (CLEOCIN) 150 MG capsule Take 150 mg by mouth. TAKES 1 HOUR PRIOR TO DENTAL WORK    . cyclobenzaprine (FLEXERIL) 5 MG tablet Take 1 tablet (5 mg total) by mouth at bedtime as needed. 90 tablet 3  . fluticasone (FLONASE) 50 MCG/ACT nasal spray Place 2 sprays into both nostrils daily. In each nostril 48 g 3  . glucosamine-chondroitin 500-400 MG tablet Take 3 tablets by mouth daily.     Marland Kitchen ibuprofen (ADVIL,MOTRIN) 200 MG tablet Take 400 mg by mouth 2 (two) times daily as needed.     Marland Kitchen leuprolide (LUPRON) 30 MG injection Inject 30 mg into the muscle every 4 (four) months. Dose and interval not clear yet    . loratadine (CLARITIN) 10 MG tablet TAKE 1 TABLET DAILY 90 tablet 3  . polyethylene glycol powder  (GLYCOLAX/MIRALAX) powder FILL TO LINE (17 GRAMS ), MIX AND DRINK TWICE A DAY AND 8.5 GRAMS  AT NOON 527 g 3  . sennosides-docusate sodium (SENOKOT-S) 8.6-50 MG tablet Take 4 tablets by mouth daily.    . Skin Protectants, Misc. (EUCERIN) cream Apply 1 application topically as needed for dry skin.    Marland Kitchen traMADol (ULTRAM) 50 MG tablet Take 1 tablet (50 mg total) by mouth 3 (three) times daily as needed. 270 tablet 0  . triamcinolone cream (KENALOG) 0.1 % Apply 1 application topically every other day. Alternating with Ciclopirox    . vitamin B-12 (CYANOCOBALAMIN) 1000 MCG tablet Take 1 tablet (1,000 mcg total) by mouth daily. 90 tablet 3  . vitamin C (ASCORBIC ACID) 500 MG tablet Take 500 mg by mouth daily.    . zoledronic acid (RECLAST) 5 MG/100ML SOLN injection Inject 5 mg into the vein once. ?yearly     Current Facility-Administered Medications on File Prior to Visit  Medication Dose Route Frequency Provider Last Rate Last Dose  . leuprolide (LUPRON) injection 30 mg  30 mg Intramuscular Once Leia Alf, MD        Allergies  Allergen Reactions  . Other     RAW ONIONS- SINUS REACTION    Past Medical History:  Diagnosis Date  . Arthritis   . Colon polyps    adenomatous  .  Osteoporosis 01/2014   T -3.8 (01/2014)  . Other constipation    chronic  . Prostate cancer ([place])    surgery then lupron  . Unspecified venous (peripheral) insufficiency   . Vitamin B12 deficiency     Past Surgical History:  Procedure Laterality Date  . BASAL CELL CARCINOMA EXCISION  2004   Left side of face  . CATARACT EXTRACTION  2000, 2003   both eyes  . JOINT REPLACEMENT  2005   Left knee  . JOINT REPLACEMENT  12/13   Right knee  . PROSTATE SURGERY  1996   cancer  . TIBIA FRACTURE SURGERY  1999  . VOLVULUS REDUCTION  12/10    Family History  Problem Relation Age of Onset  . Diabetes Son   . Hypertension Paternal Grandfather     Social History   Social History  . Marital status: Widowed     Spouse name: [name]  . Number of children: 3  . Years of education: N/A   Occupational History  . [employer]     retired   Social History Main Topics  . Smoking status: Never Smoker  . Smokeless tobacco: Never Used  . Alcohol use 0.0 - 0.6 oz/week     Comment: in past  . Drug use: Unknown  . Sexual activity: Not on file   Other Topics Concern  . Not on file   Social History Narrative   Has living will   Daughter [name] holds health care POA.   Has DNR and we have reviewed this.   Would not want tube feedings if cognitively unaware   Review of Systems Appetite is good Weight stable Sleeps well--frequent nocturia but able to get back to sleep Mood has been good Skin on forearms is very dry--no excessive bruising (but does bruise easy) No blood in urine  Occasional blood on toilet paper (after straining) Still does regular back exercises and walks daily No recurrence of SOB. No chest pain    Objective:   Physical Exam  Constitutional: He is oriented to person, place, and time. He appears well-developed and well-nourished. No distress.  Neck: Normal range of motion. Neck supple. No thyromegaly present.  Cardiovascular: Normal rate, regular rhythm and normal heart sounds.  Exam reveals no gallop.   No murmur heard. Pulmonary/Chest: Effort normal and breath sounds normal. No respiratory distress. He has no wheezes. He has no rales.  Abdominal: Soft. There is no tenderness.  Musculoskeletal: He exhibits no edema or tenderness.  Lymphadenopathy:    He has no cervical adenopathy.  Neurological: He is alert and oriented to person, place, and time.  Skin: No rash noted.  No sig lesions  Psychiatric: He has a normal mood and affect. His behavior is normal.          Assessment & Plan:

## 2015-09-27 NOTE — Assessment & Plan Note (Signed)
Doing fairly well on his regimen

## 2015-10-01 ENCOUNTER — Other Ambulatory Visit: Payer: Medicare Other

## 2015-10-01 ENCOUNTER — Ambulatory Visit: Payer: Medicare Other

## 2015-10-01 ENCOUNTER — Ambulatory Visit: Payer: Medicare Other | Admitting: Internal Medicine

## 2015-10-03 ENCOUNTER — Telehealth: Payer: Self-pay | Admitting: *Deleted

## 2015-10-03 ENCOUNTER — Inpatient Hospital Stay: Payer: Medicare Other | Attending: Internal Medicine | Admitting: Internal Medicine

## 2015-10-03 ENCOUNTER — Inpatient Hospital Stay: Payer: Medicare Other

## 2015-10-03 VITALS — BP 129/57 | HR 55 | Temp 98.4°F | Resp 18 | Wt 174.2 lb

## 2015-10-03 DIAGNOSIS — K5900 Constipation, unspecified: Secondary | ICD-10-CM | POA: Insufficient documentation

## 2015-10-03 DIAGNOSIS — C61 Malignant neoplasm of prostate: Secondary | ICD-10-CM | POA: Insufficient documentation

## 2015-10-03 DIAGNOSIS — Z86010 Personal history of colonic polyps: Secondary | ICD-10-CM | POA: Diagnosis not present

## 2015-10-03 DIAGNOSIS — M129 Arthropathy, unspecified: Secondary | ICD-10-CM | POA: Diagnosis not present

## 2015-10-03 DIAGNOSIS — I872 Venous insufficiency (chronic) (peripheral): Secondary | ICD-10-CM | POA: Insufficient documentation

## 2015-10-03 DIAGNOSIS — Z79818 Long term (current) use of other agents affecting estrogen receptors and estrogen levels: Secondary | ICD-10-CM | POA: Diagnosis not present

## 2015-10-03 DIAGNOSIS — Z79899 Other long term (current) drug therapy: Secondary | ICD-10-CM

## 2015-10-03 DIAGNOSIS — M818 Other osteoporosis without current pathological fracture: Secondary | ICD-10-CM | POA: Diagnosis not present

## 2015-10-03 DIAGNOSIS — Z85828 Personal history of other malignant neoplasm of skin: Secondary | ICD-10-CM

## 2015-10-03 DIAGNOSIS — Z7982 Long term (current) use of aspirin: Secondary | ICD-10-CM | POA: Diagnosis not present

## 2015-10-03 DIAGNOSIS — E538 Deficiency of other specified B group vitamins: Secondary | ICD-10-CM | POA: Diagnosis not present

## 2015-10-03 LAB — COMPREHENSIVE METABOLIC PANEL
ALT: 11 U/L — ABNORMAL LOW (ref 17–63)
ANION GAP: 7 (ref 5–15)
AST: 24 U/L (ref 15–41)
Albumin: 4.4 g/dL (ref 3.5–5.0)
Alkaline Phosphatase: 54 U/L (ref 38–126)
BILIRUBIN TOTAL: 0.6 mg/dL (ref 0.3–1.2)
BUN: 18 mg/dL (ref 6–20)
CALCIUM: 8.9 mg/dL (ref 8.9–10.3)
CO2: 21 mmol/L — ABNORMAL LOW (ref 22–32)
Chloride: 105 mmol/L (ref 101–111)
Creatinine, Ser: 0.95 mg/dL (ref 0.61–1.24)
GFR calc Af Amer: >60 mL/min (ref >60)
Glucose, Bld: 155 mg/dL — ABNORMAL HIGH (ref 65–99)
POTASSIUM: 4.2 mmol/L (ref 3.5–5.1)
Sodium: 133 mmol/L — ABNORMAL LOW (ref 135–145)
TOTAL PROTEIN: 7.4 g/dL (ref 6.5–8.1)

## 2015-10-03 LAB — CBC WITH DIFFERENTIAL/PLATELET
BASOS ABS: 0.1 10*3/uL (ref 0–0.1)
Basophils Relative: 2 %
EOS PCT: 4 %
Eosinophils Absolute: 0.2 10*3/uL (ref 0–0.7)
HEMATOCRIT: 33.5 % — AB (ref 40.0–52.0)
Hemoglobin: 11.3 g/dL — ABNORMAL LOW (ref 13.0–18.0)
LYMPHS ABS: 1.7 10*3/uL (ref 1.0–3.6)
LYMPHS PCT: 30 %
MCH: 27.9 pg (ref 26.0–34.0)
MCHC: 33.7 g/dL (ref 32.0–36.0)
MCV: 82.8 fL (ref 80.0–100.0)
Monocytes Absolute: 0.5 10*3/uL (ref 0.2–1.0)
Monocytes Relative: 9 %
NEUTROS ABS: 3 10*3/uL (ref 1.4–6.5)
Neutrophils Relative %: 55 %
Platelets: 155 10*3/uL (ref 150–440)
RBC: 4.05 MIL/uL — ABNORMAL LOW (ref 4.40–5.90)
RDW: 15.8 % — AB (ref 11.5–14.5)
WBC: 5.6 10*3/uL (ref 3.8–10.6)

## 2015-10-03 LAB — PSA: PSA: 2.19 ng/mL (ref 0.00–4.00)

## 2015-10-03 MED ORDER — DENOSUMAB 60 MG/ML ~~LOC~~ SOLN
60.0000 mg | Freq: Once | SUBCUTANEOUS | Status: AC
  Administered 2015-10-03: 60 mg via SUBCUTANEOUS
  Filled 2015-10-03: qty 1

## 2015-10-03 MED ORDER — LEUPROLIDE ACETATE (3 MONTH) 22.5 MG IM KIT
22.5000 mg | PACK | Freq: Once | INTRAMUSCULAR | Status: AC
  Administered 2015-10-03: 22.5 mg via INTRAMUSCULAR
  Filled 2015-10-03: qty 22.5

## 2015-10-03 NOTE — Assessment & Plan Note (Addendum)
#   Prostate cancer recurrent ? Biochemical recurrence versus metastatic disease [no bone scans done]- on Lupron every 3 months.  PSA May 2017- 2.5. Clinically no evidence of any obvious progression. Again today- lupron. PSA from today pending.   # Osteoporosis- Reviewed the bone density from 2015 - severe osteoporosis. on Prolia q 6 M- started in feb 2017. Again due today.   # follow up MD/Lupron/ labs in 3 months. Rn will relay results of PSA from today to pt.

## 2015-10-03 NOTE — Progress Notes (Signed)
[place] OFFICE PROGRESS NOTE  Patient Care Team: Venia Carbon, MD as PCP - General   SUMMARY OF ONCOLOGIC HISTORY:  # 1996 PROSTATE CANCER [Gleason score 2+3] s/p Radical Prostatectomy [[facility]]; ?Biochem RECURRENT PROSTATE CANCER-Hormone sensitive;  Intermittent Lupron; on Lupron q 44M [Jan 2016 PSA- 7.9].  HOLD LUPRON in MAY 2017; AUg 2017- Re-start  # Osteoporosis [BMD- 2015]- on Reclast q 35M; FEB 2017- Start Prolia q 6 M  INTERVAL HISTORY:  A very pleasant 82 year old male patient with above history of prostate cancer recurrent on Lupron every 3 months/ Prolia q 35M is here for follow-up.   Patient denies any falls. Denies any fractures. Patient continues to go on with a walker. He continues to live in the assisted living; is fairly active for his stage. Denies any unusual back pain. Appetite is fair. Not losing any weight. Denies any significant hot flashes. Denies any bone pain.   REVIEW OF SYSTEMS:  A complete 10 point review of system is done which is negative except mentioned above/history of present illness.   PAST MEDICAL HISTORY :  Past Medical History:  Diagnosis Date  . Arthritis   . Colon polyps    adenomatous  . Osteoporosis 01/2014   T -3.8 (01/2014)  . Other constipation    chronic  . Prostate cancer ([place])    surgery then lupron  . Unspecified venous (peripheral) insufficiency   . Vitamin B12 deficiency     PAST SURGICAL HISTORY :   Past Surgical History:  Procedure Laterality Date  . BASAL CELL CARCINOMA EXCISION  2004   Left side of face  . CATARACT EXTRACTION  2000, 2003   both eyes  . JOINT REPLACEMENT  2005   Left knee  . JOINT REPLACEMENT  12/13   Right knee  . PROSTATE SURGERY  1996   cancer  . TIBIA FRACTURE SURGERY  1999  . VOLVULUS REDUCTION  12/10    FAMILY HISTORY :   Family History  Problem Relation Age of Onset  . Diabetes Son   . Hypertension Paternal Grandfather     SOCIAL HISTORY:   Social History   Substance Use Topics  . Smoking status: Never Smoker  . Smokeless tobacco: Never Used  . Alcohol use 0.0 - 0.6 oz/week     Comment: in past    ALLERGIES:  is allergic to other.  MEDICATIONS:  Current Outpatient Prescriptions  Medication Sig Dispense Refill  . aspirin 81 MG EC tablet Take 81 mg by mouth every other day.     . Calcium Carbonate-Vitamin D (CALCIUM-D) 600-400 MG-UNIT TABS Take 1 tablet by mouth daily.    . cholecalciferol (VITAMIN D) 1000 UNITS tablet Take 1,000 Units by mouth daily.      . ciclopirox (LOPROX) 0.77 % cream Apply topically every other day. Alternating with Triamcinolone    . clindamycin (CLEOCIN) 150 MG capsule Take 150 mg by mouth. TAKES 1 HOUR PRIOR TO DENTAL WORK    . cyclobenzaprine (FLEXERIL) 5 MG tablet Take 1 tablet (5 mg total) by mouth at bedtime as needed. 90 tablet 3  . fluticasone (FLONASE) 50 MCG/ACT nasal spray Place 2 sprays into both nostrils daily. In each nostril 48 g 3  . glucosamine-chondroitin 500-400 MG tablet Take 3 tablets by mouth daily.     Marland Kitchen ibuprofen (ADVIL,MOTRIN) 200 MG tablet Take 400 mg by mouth 2 (two) times daily as needed.     . latanoprost (XALATAN) 0.005 % ophthalmic solution Place 1  drop into both eyes at bedtime.    Marland Kitchen leuprolide (LUPRON) 30 MG injection Inject 30 mg into the muscle every 4 (four) months. Dose and interval not clear yet    . loratadine (CLARITIN) 10 MG tablet TAKE 1 TABLET DAILY 90 tablet 3  . polyethylene glycol powder (GLYCOLAX/MIRALAX) powder FILL TO LINE (17 GRAMS ), MIX AND DRINK TWICE A DAY AND 8.5 GRAMS  AT NOON 527 g 3  . sennosides-docusate sodium (SENOKOT-S) 8.6-50 MG tablet Take 4 tablets by mouth daily.    . Skin Protectants, Misc. (EUCERIN) cream Apply 1 application topically as needed for dry skin.    Marland Kitchen traMADol (ULTRAM) 50 MG tablet Take 1 tablet (50 mg total) by mouth 3 (three) times daily as needed. 270 tablet 0  . triamcinolone cream (KENALOG) 0.1 % Apply 1 application topically every  other day. Alternating with Ciclopirox    . vitamin B-12 (CYANOCOBALAMIN) 1000 MCG tablet Take 1 tablet (1,000 mcg total) by mouth daily. 90 tablet 3  . vitamin C (ASCORBIC ACID) 500 MG tablet Take 500 mg by mouth daily.    . zoledronic acid (RECLAST) 5 MG/100ML SOLN injection Inject 5 mg into the vein once. ?yearly     No current facility-administered medications for this visit.    Facility-Administered Medications Ordered in Other Visits  Medication Dose Route Frequency Provider Last Rate Last Dose  . leuprolide (LUPRON) injection 30 mg  30 mg Intramuscular Once Leia Alf, MD        PHYSICAL EXAMINATION: ECOG PERFORMANCE STATUS: 1 - Symptomatic but completely ambulatory  BP (!) 129/57 (BP Location: Left Arm, Patient Position: Sitting)   Pulse (!) 55   Temp 98.4 F (36.9 C) (Tympanic)   Resp 18   Wt 174 lb 3 oz (79 kg)   BMI 22.07 kg/m   Filed Weights   10/03/15 1103  Weight: 174 lb 3 oz (79 kg)    GENERAL: Well-nourished well-developed; Alert, no distress and comfortable.  Alone; walks with a rolling walker EYES: no pallor or icterus OROPHARYNX: no thrush or ulceration; good dentition  NECK: supple, no masses felt LYMPH:  no palpable lymphadenopathy in the cervical, axillary or inguinal regions LUNGS: clear to auscultation and  No wheeze or crackles HEART/CVS: regular rate & rhythm and no murmurs; No lower extremity edema ABDOMEN:abdomen soft, non-tender and normal bowel sounds Musculoskeletal:no cyanosis of digits and no clubbing  PSYCH: alert & oriented x 3 with fluent speech NEURO: no focal motor/sensory deficits SKIN:  no rashes or significant lesions  LABORATORY DATA:  I have reviewed the data as listed    Component Value Date/Time   NA 133 (L) 10/03/2015 1022   NA 140 02/11/2012 0415   K 4.2 10/03/2015 1022   K 3.9 02/11/2012 0415   CL 105 10/03/2015 1022   CL 108 (H) 02/11/2012 0415   CO2 21 (L) 10/03/2015 1022   CO2 25 02/11/2012 0415   GLUCOSE 155  (H) 10/03/2015 1022   GLUCOSE 125 (H) 02/11/2012 0415   BUN 18 10/03/2015 1022   BUN 12 02/11/2012 0415   CREATININE 0.95 10/03/2015 1022   CREATININE 1.00 03/23/2014 1400   CALCIUM 8.9 10/03/2015 1022   CALCIUM 8.3 (L) 03/23/2014 1400   PROT 7.4 10/03/2015 1022   PROT 7.0 03/07/2014 1414   ALBUMIN 4.4 10/03/2015 1022   ALBUMIN 3.7 03/07/2014 1414   AST 24 10/03/2015 1022   AST 16 03/07/2014 1414   ALT 11 (L) 10/03/2015 1022   ALT  19 03/07/2014 1414   ALKPHOS 54 10/03/2015 1022   ALKPHOS 69 03/07/2014 1414   BILITOT 0.6 10/03/2015 1022   BILITOT 0.3 03/07/2014 1414   GFRNONAA >60 10/03/2015 1022   GFRNONAA >60 03/23/2014 1400   GFRNONAA 49 (L) 09/02/2013 1428   GFRAA >60 10/03/2015 1022   GFRAA >60 03/23/2014 1400   GFRAA 57 (L) 09/02/2013 1428    No results found for: SPEP, UPEP  Lab Results  Component Value Date   WBC 5.6 10/03/2015   NEUTROABS 3.0 10/03/2015   HGB 11.3 (L) 10/03/2015   HCT 33.5 (L) 10/03/2015   MCV 82.8 10/03/2015   PLT 155 10/03/2015      Chemistry      Component Value Date/Time   NA 133 (L) 10/03/2015 1022   NA 140 02/11/2012 0415   K 4.2 10/03/2015 1022   K 3.9 02/11/2012 0415   CL 105 10/03/2015 1022   CL 108 (H) 02/11/2012 0415   CO2 21 (L) 10/03/2015 1022   CO2 25 02/11/2012 0415   BUN 18 10/03/2015 1022   BUN 12 02/11/2012 0415   CREATININE 0.95 10/03/2015 1022   CREATININE 1.00 03/23/2014 1400      Component Value Date/Time   CALCIUM 8.9 10/03/2015 1022   CALCIUM 8.3 (L) 03/23/2014 1400   ALKPHOS 54 10/03/2015 1022   ALKPHOS 69 03/07/2014 1414   AST 24 10/03/2015 1022   AST 16 03/07/2014 1414   ALT 11 (L) 10/03/2015 1022   ALT 19 03/07/2014 1414   BILITOT 0.6 10/03/2015 1022   BILITOT 0.3 03/07/2014 1414         ASSESSMENT & PLAN:   Prostate cancer ([place]) # Prostate cancer recurrent ? Biochemical recurrence versus metastatic disease [no bone scans done]- on Lupron every 3 months.  PSA May 2017- 2.5. Clinically no  evidence of any obvious progression. Again today- lupron. PSA from today pending.   # Osteoporosis- Reviewed the bone density from 2015 - severe osteoporosis. on Prolia q 6 M- started in feb 2017. Again due today.   # follow up MD/Lupron/ labs in 3 months. Rn will relay results of PSA from today to pt.      Cammie Sickle, MD 10/03/2015 12:36 PM

## 2015-10-03 NOTE — Telephone Encounter (Signed)
Pt requesting that Dr. Aletha Halim team contact him tomorrow with his psa level.

## 2015-10-04 ENCOUNTER — Telehealth: Payer: Self-pay

## 2015-10-04 LAB — TESTOSTERONE

## 2015-10-04 NOTE — Telephone Encounter (Signed)
Returned pt's phone call regarding wanting to know his PSA results.  Pt made aware PSA was 2.19 yesterday.  Pt verbalized a thanks and had no other questions or concerns.

## 2015-10-04 NOTE — Telephone Encounter (Signed)
-----   Message from Cammie Sickle, MD sent at 10/03/2015  5:24 PM EDT ----- Please call patient with results of his PSA thank you

## 2015-10-04 NOTE — Telephone Encounter (Signed)
PSA-2.19

## 2015-10-10 DIAGNOSIS — H401122 Primary open-angle glaucoma, left eye, moderate stage: Secondary | ICD-10-CM | POA: Diagnosis not present

## 2015-10-19 ENCOUNTER — Ambulatory Visit (INDEPENDENT_AMBULATORY_CARE_PROVIDER_SITE_OTHER): Payer: Medicare Other | Admitting: Sports Medicine

## 2015-10-19 ENCOUNTER — Encounter: Payer: Self-pay | Admitting: Sports Medicine

## 2015-10-19 DIAGNOSIS — L84 Corns and callosities: Secondary | ICD-10-CM | POA: Diagnosis not present

## 2015-10-19 DIAGNOSIS — M79676 Pain in unspecified toe(s): Secondary | ICD-10-CM | POA: Diagnosis not present

## 2015-10-19 DIAGNOSIS — I739 Peripheral vascular disease, unspecified: Secondary | ICD-10-CM

## 2015-10-19 DIAGNOSIS — B351 Tinea unguium: Secondary | ICD-10-CM

## 2015-10-19 NOTE — Progress Notes (Signed)
Patient ID: Benjamin Villarreal, male   DOB: [birth year], 82 y.o.   MRN: [id number]  Subjective: Benjamin Villarreal is a 82 y.o. male patient seen today in office with complaint of painful callus and thickened and elongated toenails; unable to trim. Patient denies changes with medical history. Patient has no other pedal complaints at this time.   Patient Active Problem List   Diagnosis Date Noted  . SOB (shortness of breath) 06/04/2015  . Anemia of chronic disease 10/19/2014  . Thrombocytopenia ([place]) 07/13/2014  . Tachycardia 02/02/2014  . 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 Allergic rhinitis due to pollen 10/21/2012  . VITAMIN B12 DEFICIENCY 12/13/2009  . Prostate cancer ([place]) 03/22/2009  . Chronic venous insufficiency 09/07/2008  . CONSTIPATION, CHRONIC 09/07/2008  . Osteoarthritis, multiple sites 09/05/2008  . Osteoporosis 09/05/2008   Current Outpatient Prescriptions on File Prior to Visit  Medication Sig Dispense Refill  . aspirin 81 MG EC tablet Take 81 mg by mouth every other day.     . Calcium Carbonate-Vitamin D (CALCIUM-D) 600-400 MG-UNIT TABS Take 1 tablet by mouth daily.    . cholecalciferol (VITAMIN D) 1000 UNITS tablet Take 1,000 Units by mouth daily.      . ciclopirox (LOPROX) 0.77 % cream Apply topically every other day. Alternating with Triamcinolone    . clindamycin (CLEOCIN) 150 MG capsule Take 150 mg by mouth. TAKES 1 HOUR PRIOR TO DENTAL WORK    . cyclobenzaprine (FLEXERIL) 5 MG tablet Take 1 tablet (5 mg total) by mouth at bedtime as needed. 90 tablet 3  . fluticasone (FLONASE) 50 MCG/ACT nasal spray Place 2 sprays into both nostrils daily. In each nostril 48 g 3  . glucosamine-chondroitin 500-400 MG tablet Take 3 tablets by mouth daily.     Marland Kitchen ibuprofen (ADVIL,MOTRIN) 200 MG tablet Take 400 mg by mouth 2 (two) times daily as needed.     . latanoprost (XALATAN) 0.005 % ophthalmic solution Place 1 drop into both eyes at bedtime.    Marland Kitchen leuprolide (LUPRON) 30 MG injection  Inject 30 mg into the muscle every 4 (four) months. Dose and interval not clear yet    . loratadine (CLARITIN) 10 MG tablet TAKE 1 TABLET DAILY 90 tablet 3  . polyethylene glycol powder (GLYCOLAX/MIRALAX) powder FILL TO LINE (17 GRAMS ), MIX AND DRINK TWICE A DAY AND 8.5 GRAMS  AT NOON 527 g 3  . sennosides-docusate sodium (SENOKOT-S) 8.6-50 MG tablet Take 4 tablets by mouth daily.    . Skin Protectants, Misc. (EUCERIN) cream Apply 1 application topically as needed for dry skin.    Marland Kitchen traMADol (ULTRAM) 50 MG tablet Take 1 tablet (50 mg total) by mouth 3 (three) times daily as needed. 270 tablet 0  . triamcinolone cream (KENALOG) 0.1 % Apply 1 application topically every other day. Alternating with Ciclopirox    . vitamin B-12 (CYANOCOBALAMIN) 1000 MCG tablet Take 1 tablet (1,000 mcg total) by mouth daily. 90 tablet 3  . vitamin C (ASCORBIC ACID) 500 MG tablet Take 500 mg by mouth daily.    . zoledronic acid (RECLAST) 5 MG/100ML SOLN injection Inject 5 mg into the vein once. ?yearly     Current Facility-Administered Medications on File Prior to Visit  Medication Dose Route Frequency Provider Last Rate Last Dose  . leuprolide (LUPRON) injection 30 mg  30 mg Intramuscular Once Leia Alf, MD       Allergies  Allergen Reactions  . Other     RAW ONIONS- SINUS REACTION  Objective: Physical Exam  General: Well developed, nourished, no acute distress, awake, alert and oriented x 3. Rolling walker assisted gait.   Vascular: Dorsalis pedis artery 1/4 bilateral, Posterior tibial artery 1/4 bilateral, skin temperature warm to warm proximal to distal bilateral lower extremities, + varicosities and hemosiderin hyperpigmentation, no edema, no pedal hair present bilateral.  Neurological: Gross sensation present via light touch bilateral.   Dermatological: Skin is warm, dry, and supple bilateral, Nails 1-10 are tender, long, thick, and discolored with mild subungal debris, no webspace macerations  present bilateral, no open lesions present bilateral, no signs of tinea, + callus/hyperkeratotic tissue present sub met 1 on right. No signs of infection bilateral.  Musculoskeletal: No gross boney deformities noted bilateral. Muscular strength within normal limits without pain or limitation on range of motion. No pain with calf compression bilateral.  Assessment and Plan:  Problem List Items Addressed This Visit    None    Visit Diagnoses    Dermatophytosis of nail    -  Primary   PVD (peripheral vascular disease) ([place])       Pain of toe, unspecified laterality       Callus of foot         -Examined patient.  -Discussed treatment options for painful callus and mycotic nails. -Mechanically debrided callus x1 using sterile chisel blade and reduced mycotic nails with sterile nail nipper and dremel nail file without incident. -Recommend good supportive shoes daily and ambulation with assistance of rolling walker for stability.  -Patient to return in 3 months for follow up evaluation or sooner if symptoms worsen.  Landis Martins, DPM

## 2015-10-23 ENCOUNTER — Other Ambulatory Visit: Payer: Self-pay | Admitting: Internal Medicine

## 2015-10-25 DIAGNOSIS — D229 Melanocytic nevi, unspecified: Secondary | ICD-10-CM | POA: Diagnosis not present

## 2015-10-25 DIAGNOSIS — L812 Freckles: Secondary | ICD-10-CM | POA: Diagnosis not present

## 2015-10-25 DIAGNOSIS — Z85828 Personal history of other malignant neoplasm of skin: Secondary | ICD-10-CM | POA: Diagnosis not present

## 2015-10-25 DIAGNOSIS — D1800 Hemangioma unspecified site: Secondary | ICD-10-CM | POA: Diagnosis not present

## 2015-10-25 DIAGNOSIS — Z1283 Encounter for screening for malignant neoplasm of skin: Secondary | ICD-10-CM | POA: Diagnosis not present

## 2015-10-25 DIAGNOSIS — L578 Other skin changes due to chronic exposure to nonionizing radiation: Secondary | ICD-10-CM | POA: Diagnosis not present

## 2015-10-25 DIAGNOSIS — D692 Other nonthrombocytopenic purpura: Secondary | ICD-10-CM | POA: Diagnosis not present

## 2015-10-25 DIAGNOSIS — L821 Other seborrheic keratosis: Secondary | ICD-10-CM | POA: Diagnosis not present

## 2015-10-25 DIAGNOSIS — I8393 Asymptomatic varicose veins of bilateral lower extremities: Secondary | ICD-10-CM | POA: Diagnosis not present

## 2015-12-25 DIAGNOSIS — Z23 Encounter for immunization: Secondary | ICD-10-CM | POA: Diagnosis not present

## 2016-01-02 ENCOUNTER — Other Ambulatory Visit: Payer: Self-pay | Admitting: Internal Medicine

## 2016-01-03 ENCOUNTER — Inpatient Hospital Stay (HOSPITAL_BASED_OUTPATIENT_CLINIC_OR_DEPARTMENT_OTHER): Payer: Medicare Other | Admitting: Internal Medicine

## 2016-01-03 ENCOUNTER — Inpatient Hospital Stay: Payer: Medicare Other

## 2016-01-03 ENCOUNTER — Encounter: Payer: Self-pay | Admitting: Internal Medicine

## 2016-01-03 ENCOUNTER — Inpatient Hospital Stay: Payer: Medicare Other | Attending: Internal Medicine

## 2016-01-03 VITALS — BP 133/75 | HR 54 | Temp 97.6°F | Resp 20 | Ht 74.5 in | Wt 177.7 lb

## 2016-01-03 DIAGNOSIS — E539 Vitamin B deficiency, unspecified: Secondary | ICD-10-CM | POA: Diagnosis not present

## 2016-01-03 DIAGNOSIS — Z9079 Acquired absence of other genital organ(s): Secondary | ICD-10-CM | POA: Insufficient documentation

## 2016-01-03 DIAGNOSIS — Z7982 Long term (current) use of aspirin: Secondary | ICD-10-CM

## 2016-01-03 DIAGNOSIS — Z85828 Personal history of other malignant neoplasm of skin: Secondary | ICD-10-CM | POA: Insufficient documentation

## 2016-01-03 DIAGNOSIS — Z79899 Other long term (current) drug therapy: Secondary | ICD-10-CM | POA: Diagnosis not present

## 2016-01-03 DIAGNOSIS — M129 Arthropathy, unspecified: Secondary | ICD-10-CM

## 2016-01-03 DIAGNOSIS — C61 Malignant neoplasm of prostate: Secondary | ICD-10-CM

## 2016-01-03 DIAGNOSIS — Z8781 Personal history of (healed) traumatic fracture: Secondary | ICD-10-CM | POA: Insufficient documentation

## 2016-01-03 DIAGNOSIS — K5900 Constipation, unspecified: Secondary | ICD-10-CM | POA: Diagnosis not present

## 2016-01-03 DIAGNOSIS — Z79818 Long term (current) use of other agents affecting estrogen receptors and estrogen levels: Secondary | ICD-10-CM | POA: Insufficient documentation

## 2016-01-03 DIAGNOSIS — D649 Anemia, unspecified: Secondary | ICD-10-CM

## 2016-01-03 DIAGNOSIS — I872 Venous insufficiency (chronic) (peripheral): Secondary | ICD-10-CM

## 2016-01-03 DIAGNOSIS — M818 Other osteoporosis without current pathological fracture: Secondary | ICD-10-CM | POA: Diagnosis not present

## 2016-01-03 DIAGNOSIS — Z86010 Personal history of colonic polyps: Secondary | ICD-10-CM | POA: Diagnosis not present

## 2016-01-03 LAB — COMPREHENSIVE METABOLIC PANEL
ALK PHOS: 49 U/L (ref 38–126)
ALT: 13 U/L — AB (ref 17–63)
AST: 24 U/L (ref 15–41)
Albumin: 4.2 g/dL (ref 3.5–5.0)
Anion gap: 7 (ref 5–15)
BUN: 21 mg/dL — AB (ref 6–20)
CALCIUM: 9.1 mg/dL (ref 8.9–10.3)
CHLORIDE: 106 mmol/L (ref 101–111)
CO2: 23 mmol/L (ref 22–32)
CREATININE: 0.95 mg/dL (ref 0.61–1.24)
GFR calc Af Amer: >60 mL/min (ref >60)
GFR calc non Af Amer: >60 mL/min (ref >60)
GLUCOSE: 149 mg/dL — AB (ref 65–99)
Potassium: 4.4 mmol/L (ref 3.5–5.1)
SODIUM: 136 mmol/L (ref 135–145)
Total Bilirubin: 0.5 mg/dL (ref 0.3–1.2)
Total Protein: 7.3 g/dL (ref 6.5–8.1)

## 2016-01-03 LAB — CBC WITH DIFFERENTIAL/PLATELET
BASOS ABS: 0.1 10*3/uL (ref 0–0.1)
Basophils Relative: 2 %
EOS ABS: 0.3 10*3/uL (ref 0–0.7)
EOS PCT: 5 %
HCT: 34 % — ABNORMAL LOW (ref 40.0–52.0)
HEMOGLOBIN: 11.3 g/dL — AB (ref 13.0–18.0)
LYMPHS ABS: 1.6 10*3/uL (ref 1.0–3.6)
LYMPHS PCT: 30 %
MCH: 27.6 pg (ref 26.0–34.0)
MCHC: 33.3 g/dL (ref 32.0–36.0)
MCV: 83 fL (ref 80.0–100.0)
Monocytes Absolute: 0.5 10*3/uL (ref 0.2–1.0)
Monocytes Relative: 9 %
NEUTROS PCT: 54 %
Neutro Abs: 2.9 10*3/uL (ref 1.4–6.5)
PLATELETS: 157 10*3/uL (ref 150–440)
RBC: 4.09 MIL/uL — AB (ref 4.40–5.90)
RDW: 15.3 % — ABNORMAL HIGH (ref 11.5–14.5)
WBC: 5.5 10*3/uL (ref 3.8–10.6)

## 2016-01-03 LAB — PSA: PSA: 2.35 ng/mL (ref 0.00–4.00)

## 2016-01-03 MED ORDER — LEUPROLIDE ACETATE (3 MONTH) 22.5 MG IM KIT
22.5000 mg | PACK | Freq: Once | INTRAMUSCULAR | Status: AC
  Administered 2016-01-03: 22.5 mg via INTRAMUSCULAR
  Filled 2016-01-03: qty 22.5

## 2016-01-03 NOTE — Assessment & Plan Note (Signed)
#   Prostate cancer recurrent ? Biochemical recurrence versus metastatic disease [no bone scans done]- on Lupron every 3 months.  PSA AUG 2017- 2.1; testosterone < 3. Clinically no evidence of any obvious progression. Again today- lupron. PSA from today pending. Will plan bone scan if PSA starts trending up.   # Osteoporosis- Reviewed the bone density from 2015 - severe osteoporosis. on Prolia q 6 M- started in feb 2017. Again due in 3 months from now.    # Mild Anemia Hb 11- stable for now.   # follow up MD/Lupron/Prolia labs in 3 months. Rn will relay results of PSA from today to pt.

## 2016-01-03 NOTE — Progress Notes (Signed)
Patient here for follow-up with Dr. Rogue Bussing - Lurpon and prolia inj.  He has no medical complaints today.  Pt provided with literature for adv. Directives.

## 2016-01-03 NOTE — Progress Notes (Signed)
[facility] OFFICE PROGRESS NOTE  Patient Care Team: Venia Carbon, MD as PCP - General   SUMMARY OF ONCOLOGIC HISTORY: Oncology History    # 1996 PROSTATE CANCER [Gleason score 2+3] s/p Radical Prostatectomy [[facility]]; ?Biochem RECURRENT PROSTATE CANCER-Hormone sensitive;  Intermittent Lupron; on Lupron q 47M [Jan 2016 PSA- 7.9].  HOLD LUPRON in MAY 2017; AUg 2017- Re-start  # Osteoporosis [BMD- 2015]- on Reclast q 77M; FEB 2017- Start Prolia q 6 M     Prostate cancer ([place])   03/22/2009 Initial Diagnosis    Prostate cancer ([place])       INTERVAL HISTORY:  A very pleasant 82 year old male patient with above history of prostate cancer recurrent on Lupron every 3 months/ Prolia q 77M is here for follow-up.  He continues to live in the assisted living; is fairly active for his age.   Denies any significant pain. Patient denies any falls. Denies any fractures. Patient continues to go on with a walker.Appetite is fair. Not losing any weight. Denies any significant hot flashes. Denies any difficulty urination.   REVIEW OF SYSTEMS:  A complete 10 point review of system is done which is negative except mentioned above/history of present illness.   PAST MEDICAL HISTORY :  Past Medical History:  Diagnosis Date  . Arthritis   . Colon polyps    adenomatous  . History of fracture of tibia   . History of tachycardia   . Osteoporosis 01/2014   T -3.8 (01/2014)  . Other constipation    chronic  . Prostate cancer ([place])    surgery then lupron  . Unspecified venous (peripheral) insufficiency   . Vitamin B12 deficiency     PAST SURGICAL HISTORY :   Past Surgical History:  Procedure Laterality Date  . BASAL CELL CARCINOMA EXCISION  2004   Left side of face  . CATARACT EXTRACTION  2000, 2003   both eyes  . COLONOSCOPY     h/o adenomatous polyps  . JOINT REPLACEMENT  2005   Left knee  . JOINT REPLACEMENT  12/13   Right knee  . PROSTATE SURGERY  1996   cancer  .  sigmoid resection    . TIBIA FRACTURE SURGERY  1999  . VOLVULUS REDUCTION  12/10    FAMILY HISTORY :   Family History  Problem Relation Age of Onset  . Diabetes Son   . Hypertension Paternal Grandfather     SOCIAL HISTORY:   Social History  Substance Use Topics  . Smoking status: Never Smoker  . Smokeless tobacco: Never Used  . Alcohol use 0.0 - 0.6 oz/week     Comment: in past    ALLERGIES:  is allergic to other.  MEDICATIONS:  Current Outpatient Prescriptions  Medication Sig Dispense Refill  . aspirin 81 MG EC tablet Take 81 mg by mouth every other day.     . Calcium Carbonate-Vitamin D (CALCIUM-D) 600-400 MG-UNIT TABS Take 1 tablet by mouth 2 (two) times daily.     . cyclobenzaprine (FLEXERIL) 5 MG tablet Take 1 tablet (5 mg total) by mouth at bedtime as needed. 90 tablet 3  . fluticasone (FLONASE) 50 MCG/ACT nasal spray Place 2 sprays into both nostrils daily. In each nostril 48 g 3  . ibuprofen (ADVIL,MOTRIN) 200 MG tablet Take 400 mg by mouth 2 (two) times daily as needed.     . latanoprost (XALATAN) 0.005 % ophthalmic solution Place 1 drop into both eyes at bedtime.    Marland Kitchen leuprolide (  LUPRON) 30 MG injection Inject 30 mg into the muscle every 4 (four) months. Dose and interval not clear yet    . loratadine (CLARITIN) 10 MG tablet TAKE 1 TABLET DAILY 90 tablet 3  . polyethylene glycol powder (GLYCOLAX/MIRALAX) powder FILL TO LINE (17 GRAMS ), MIX AND DRINK TWICE A DAY AND 8.5 GRAMS  AT NOON 527 g 3  . sennosides-docusate sodium (SENOKOT-S) 8.6-50 MG tablet Take 2 tablets by mouth daily.     . Skin Protectants, Misc. (EUCERIN) cream Apply 1 application topically as needed for dry skin.    Marland Kitchen traMADol (ULTRAM) 50 MG tablet Take 1 tablet (50 mg total) by mouth 3 (three) times daily as needed. 270 tablet 0  . triamcinolone cream (KENALOG) 0.1 % Apply 1 application topically every other day. Alternating with Ciclopirox    . vitamin B-12 (CYANOCOBALAMIN) 1000 MCG tablet Take 1  tablet (1,000 mcg total) by mouth daily. 90 tablet 3  . vitamin C (ASCORBIC ACID) 500 MG tablet Take 500 mg by mouth daily.    . cholecalciferol (VITAMIN D) 1000 UNITS tablet Take 1,000 Units by mouth daily.      . ciclopirox (LOPROX) 0.77 % cream Apply topically every other day. Alternating with Triamcinolone    . clindamycin (CLEOCIN) 150 MG capsule Take 150 mg by mouth. TAKES 1 HOUR PRIOR TO DENTAL WORK    . zoledronic acid (RECLAST) 5 MG/100ML SOLN injection Inject 5 mg into the vein once. ?yearly     No current facility-administered medications for this visit.    Facility-Administered Medications Ordered in Other Visits  Medication Dose Route Frequency Provider Last Rate Last Dose  . leuprolide (LUPRON) injection 30 mg  30 mg Intramuscular Once Leia Alf, MD        PHYSICAL EXAMINATION: ECOG PERFORMANCE STATUS: 1 - Symptomatic but completely ambulatory  BP 133/75 (BP Location: Left Arm, Patient Position: Sitting)   Pulse (!) 54   Temp 97.6 F (36.4 C) (Tympanic)   Resp 20   Ht 6' 2.5" (1.892 m)   Wt 177 lb 11.2 oz (80.6 kg)   BMI 22.51 kg/m   Filed Weights   01/03/16 1053  Weight: 177 lb 11.2 oz (80.6 kg)    GENERAL: Well-nourished well-developed; Alert, no distress and comfortable.  Alone; walks with a rolling walker EYES: no pallor or icterus OROPHARYNX: no thrush or ulceration; good dentition  NECK: supple, no masses felt LYMPH:  no palpable lymphadenopathy in the cervical, axillary or inguinal regions LUNGS: clear to auscultation and  No wheeze or crackles HEART/CVS: regular rate & rhythm and no murmurs; No lower extremity edema ABDOMEN:abdomen soft, non-tender and normal bowel sounds Musculoskeletal:no cyanosis of digits and no clubbing  PSYCH: alert & oriented x 3 with fluent speech NEURO: no focal motor/sensory deficits SKIN:  no rashes or significant lesions  LABORATORY DATA:  I have reviewed the data as listed    Component Value Date/Time   NA 136  01/03/2016 0950   NA 140 02/11/2012 0415   K 4.4 01/03/2016 0950   K 3.9 02/11/2012 0415   CL 106 01/03/2016 0950   CL 108 (H) 02/11/2012 0415   CO2 23 01/03/2016 0950   CO2 25 02/11/2012 0415   GLUCOSE 149 (H) 01/03/2016 0950   GLUCOSE 125 (H) 02/11/2012 0415   BUN 21 (H) 01/03/2016 0950   BUN 12 02/11/2012 0415   CREATININE 0.95 01/03/2016 0950   CREATININE 1.00 03/23/2014 1400   CALCIUM 9.1 01/03/2016 0950  CALCIUM 8.3 (L) 03/23/2014 1400   PROT 7.3 01/03/2016 0950   PROT 7.0 03/07/2014 1414   ALBUMIN 4.2 01/03/2016 0950   ALBUMIN 3.7 03/07/2014 1414   AST 24 01/03/2016 0950   AST 16 03/07/2014 1414   ALT 13 (L) 01/03/2016 0950   ALT 19 03/07/2014 1414   ALKPHOS 49 01/03/2016 0950   ALKPHOS 69 03/07/2014 1414   BILITOT 0.5 01/03/2016 0950   BILITOT 0.3 03/07/2014 1414   GFRNONAA >60 01/03/2016 0950   GFRNONAA >60 03/23/2014 1400   GFRNONAA 49 (L) 09/02/2013 1428   GFRAA >60 01/03/2016 0950   GFRAA >60 03/23/2014 1400   GFRAA 57 (L) 09/02/2013 1428    No results found for: SPEP, UPEP  Lab Results  Component Value Date   WBC 5.5 01/03/2016   NEUTROABS 2.9 01/03/2016   HGB 11.3 (L) 01/03/2016   HCT 34.0 (L) 01/03/2016   MCV 83.0 01/03/2016   PLT 157 01/03/2016      Chemistry      Component Value Date/Time   NA 136 01/03/2016 0950   NA 140 02/11/2012 0415   K 4.4 01/03/2016 0950   K 3.9 02/11/2012 0415   CL 106 01/03/2016 0950   CL 108 (H) 02/11/2012 0415   CO2 23 01/03/2016 0950   CO2 25 02/11/2012 0415   BUN 21 (H) 01/03/2016 0950   BUN 12 02/11/2012 0415   CREATININE 0.95 01/03/2016 0950   CREATININE 1.00 03/23/2014 1400      Component Value Date/Time   CALCIUM 9.1 01/03/2016 0950   CALCIUM 8.3 (L) 03/23/2014 1400   ALKPHOS 49 01/03/2016 0950   ALKPHOS 69 03/07/2014 1414   AST 24 01/03/2016 0950   AST 16 03/07/2014 1414   ALT 13 (L) 01/03/2016 0950   ALT 19 03/07/2014 1414   BILITOT 0.5 01/03/2016 0950   BILITOT 0.3 03/07/2014 1414      Results for BARRY, LACINA (MRN [id number]) as of 01/03/2016 11:22  Ref. Range 07/25/2014 09:57 11/20/2014 10:35 03/28/2015 10:10 06/25/2015 10:10 10/03/2015 10:22  PSA Latest Ref Range: 0.00 - 4.00 ng/mL 3.24 3.33 3.20 2.50 2.19      ASSESSMENT & PLAN:   Prostate cancer [facility]) # Prostate cancer recurrent ? Biochemical recurrence versus metastatic disease [no bone scans done]- on Lupron every 3 months.  PSA AUG 2017- 2.1; testosterone < 3. Clinically no evidence of any obvious progression. Again today- lupron. PSA from today pending. Will plan bone scan if PSA starts trending up.   # Osteoporosis- Reviewed the bone density from 2015 - severe osteoporosis. on Prolia q 6 M- started in feb 2017. Again due in 3 months from now.    # Mild Anemia Hb 11- stable for now.   # follow up MD/Lupron/Prolia labs in 3 months. Rn will relay results of PSA from today to pt.     Cammie Sickle, MD 01/03/2016 1:35 PM

## 2016-01-04 ENCOUNTER — Telehealth: Payer: Self-pay | Admitting: *Deleted

## 2016-01-04 NOTE — Telephone Encounter (Signed)
-----   Message from Cammie Sickle, MD sent at 01/03/2016  5:57 PM EST ----- Please inform patient that PSA is stable at 2.35. No new recommendations for now/follow up as planned.

## 2016-01-04 NOTE — Telephone Encounter (Signed)
Results provided to patient. Patient educated to keep apt in Feb. 2018 as scheduled. No changes in tx plan at this time.  Teach back process performed with pt.

## 2016-01-24 ENCOUNTER — Encounter: Payer: Self-pay | Admitting: Internal Medicine

## 2016-01-24 ENCOUNTER — Non-Acute Institutional Stay: Payer: Medicare Other | Admitting: Internal Medicine

## 2016-01-24 VITALS — BP 130/70 | HR 64 | Temp 97.8°F | Resp 16 | Wt 180.0 lb

## 2016-01-24 DIAGNOSIS — M159 Polyosteoarthritis, unspecified: Secondary | ICD-10-CM

## 2016-01-24 DIAGNOSIS — I872 Venous insufficiency (chronic) (peripheral): Secondary | ICD-10-CM | POA: Diagnosis not present

## 2016-01-24 DIAGNOSIS — G629 Polyneuropathy, unspecified: Secondary | ICD-10-CM | POA: Insufficient documentation

## 2016-01-24 DIAGNOSIS — M818 Other osteoporosis without current pathological fracture: Secondary | ICD-10-CM

## 2016-01-24 DIAGNOSIS — K5909 Other constipation: Secondary | ICD-10-CM | POA: Diagnosis not present

## 2016-01-24 DIAGNOSIS — C61 Malignant neoplasm of prostate: Secondary | ICD-10-CM

## 2016-01-24 DIAGNOSIS — M150 Primary generalized (osteo)arthritis: Secondary | ICD-10-CM | POA: Diagnosis not present

## 2016-01-24 NOTE — Assessment & Plan Note (Signed)
Doing well with regular lupron Also scheduled for prolia regularly

## 2016-01-24 NOTE — Assessment & Plan Note (Signed)
Satisfied with his current regimen

## 2016-01-24 NOTE — Progress Notes (Signed)
Subjective:    Patient ID: Benjamin Villarreal, male    DOB: February 26, 1920, 82 y.o.   MRN: [id number]  HPI Assisted living visit to review chronic health conditions Reviewed oncology visits Reviewed status with Luellen Pucker RN  He feels well Getting lupron every 3 months---keeping PSA done No bone pain Voids okay--no incontinence Nocturia frequently-- 2-3 times  Some pain in let shoulder area Not consistent and seems to be slight  Leg swelling is not a problem Does wear the support socks daily Notes decreased sensation in feet and legs-- no pain or burning Does take oral B12 daily   bowels have been fine Continues on same regimen  Current Outpatient Prescriptions on File Prior to Visit  Medication Sig Dispense Refill  . aspirin 81 MG EC tablet Take 81 mg by mouth every other day.     . Calcium Carbonate-Vitamin D (CALCIUM-D) 600-400 MG-UNIT TABS Take 1 tablet by mouth 2 (two) times daily.     . cholecalciferol (VITAMIN D) 1000 UNITS tablet Take 1,000 Units by mouth daily.      . ciclopirox (LOPROX) 0.77 % cream Apply topically every other day. Alternating with Triamcinolone    . clindamycin (CLEOCIN) 150 MG capsule Take 150 mg by mouth. TAKES 1 HOUR PRIOR TO DENTAL WORK    . cyclobenzaprine (FLEXERIL) 5 MG tablet Take 1 tablet (5 mg total) by mouth at bedtime as needed. 90 tablet 3  . fluticasone (FLONASE) 50 MCG/ACT nasal spray Place 2 sprays into both nostrils daily. In each nostril 48 g 3  . ibuprofen (ADVIL,MOTRIN) 200 MG tablet Take 400 mg by mouth 2 (two) times daily as needed.     . latanoprost (XALATAN) 0.005 % ophthalmic solution Place 1 drop into both eyes at bedtime.    Marland Kitchen leuprolide (LUPRON) 30 MG injection Inject 30 mg into the muscle every 4 (four) months. Dose and interval not clear yet    . loratadine (CLARITIN) 10 MG tablet TAKE 1 TABLET DAILY 90 tablet 3  . polyethylene glycol powder (GLYCOLAX/MIRALAX) powder FILL TO LINE (17 GRAMS ), MIX AND DRINK TWICE A DAY AND 8.5 GRAMS   AT NOON 527 g 3  . sennosides-docusate sodium (SENOKOT-S) 8.6-50 MG tablet Take 2 tablets by mouth daily.     . Skin Protectants, Misc. (EUCERIN) cream Apply 1 application topically as needed for dry skin.    Marland Kitchen traMADol (ULTRAM) 50 MG tablet Take 1 tablet (50 mg total) by mouth 3 (three) times daily as needed. 270 tablet 0  . triamcinolone cream (KENALOG) 0.1 % Apply 1 application topically every other day. Alternating with Ciclopirox    . vitamin B-12 (CYANOCOBALAMIN) 1000 MCG tablet Take 1 tablet (1,000 mcg total) by mouth daily. 90 tablet 3  . vitamin C (ASCORBIC ACID) 500 MG tablet Take 500 mg by mouth daily.    . zoledronic acid (RECLAST) 5 MG/100ML SOLN injection Inject 5 mg into the vein once. ?yearly     Current Facility-Administered Medications on File Prior to Visit  Medication Dose Route Frequency Provider Last Rate Last Dose  . leuprolide (LUPRON) injection 30 mg  30 mg Intramuscular Once Leia Alf, MD        Allergies  Allergen Reactions  . Other     RAW ONIONS- SINUS REACTION    Past Medical History:  Diagnosis Date  . Arthritis   . Colon polyps    adenomatous  . History of fracture of tibia   . History of tachycardia   .  Osteoporosis 01/2014   T -3.8 (01/2014)  . Other constipation    chronic  . Prostate cancer ([place])    surgery then lupron  . Unspecified venous (peripheral) insufficiency   . Vitamin B12 deficiency     Past Surgical History:  Procedure Laterality Date  . BASAL CELL CARCINOMA EXCISION  2004   Left side of face  . CATARACT EXTRACTION  2000, 2003   both eyes  . COLONOSCOPY     h/o adenomatous polyps  . JOINT REPLACEMENT  2005   Left knee  . JOINT REPLACEMENT  12/13   Right knee  . PROSTATE SURGERY  1996   cancer  . sigmoid resection    . TIBIA FRACTURE SURGERY  1999  . VOLVULUS REDUCTION  12/10    Family History  Problem Relation Age of Onset  . Diabetes Son   . Hypertension Paternal Grandfather     Social History    Social History  . Marital status: Widowed    Spouse name: [name]  . Number of children: 3  . Years of education: N/A   Occupational History  . [employer]     retired   Social History Main Topics  . Smoking status: Never Smoker  . Smokeless tobacco: Never Used  . Alcohol use 0.0 - 0.6 oz/week     Comment: in past  . Drug use: Unknown  . Sexual activity: Not on file   Other Topics Concern  . Not on file   Social History Narrative   Has living will   Daughter [name] holds health care POA.   Has DNR and we have reviewed this.   Would not want tube feedings if cognitively unaware   Review of Systems No recent problems with the vertigo Appetite is good Weight is up slightly Sleeps fairly well in general    Objective:   Physical Exam  Constitutional: He appears well-developed and well-nourished. No distress.  Neck: Normal range of motion. No thyromegaly present.  Cardiovascular: Normal rate, regular rhythm and normal heart sounds.  Exam reveals no gallop.   No murmur heard. Pulmonary/Chest: Effort normal and breath sounds normal. No respiratory distress. He has no wheezes. He has no rales.  Abdominal: Soft. There is no tenderness.  Musculoskeletal: He exhibits no edema or tenderness.  Lymphadenopathy:    He has no cervical adenopathy.  Psychiatric: He has a normal mood and affect. His behavior is normal.          Assessment & Plan:

## 2016-01-24 NOTE — Assessment & Plan Note (Signed)
No major joint problems

## 2016-01-24 NOTE — Assessment & Plan Note (Signed)
Related to his androgen deprivation prolia every 6 months

## 2016-01-24 NOTE — Assessment & Plan Note (Signed)
Has had increased symptoms in his legs Will check his B12 levels--change to sublingual if low

## 2016-01-24 NOTE — Assessment & Plan Note (Signed)
Has been better of late Still wears the support socks

## 2016-01-25 ENCOUNTER — Ambulatory Visit (INDEPENDENT_AMBULATORY_CARE_PROVIDER_SITE_OTHER): Payer: Medicare Other | Admitting: Podiatry

## 2016-01-25 DIAGNOSIS — L608 Other nail disorders: Secondary | ICD-10-CM | POA: Diagnosis not present

## 2016-01-25 DIAGNOSIS — L603 Nail dystrophy: Secondary | ICD-10-CM

## 2016-01-25 DIAGNOSIS — B351 Tinea unguium: Secondary | ICD-10-CM

## 2016-01-25 DIAGNOSIS — M79609 Pain in unspecified limb: Secondary | ICD-10-CM

## 2016-01-27 NOTE — Progress Notes (Signed)
SUBJECTIVE Patient  presents to office today complaining of elongated, thickened nails. Pain while ambulating in shoes. Patient is unable to trim their own nails.   OBJECTIVE General Patient is awake, alert, and oriented x 3 and in no acute distress. Derm Skin is dry and supple bilateral. Negative open lesions or macerations. Remaining integument unremarkable. Nails are tender, long, thickened and dystrophic with subungual debris, consistent with onychomycosis, 1-5 bilateral. No signs of infection noted. Vasc  DP and PT pedal pulses palpable bilaterally. Temperature gradient within normal limits.  Neuro Epicritic and protective threshold sensation diminished bilaterally.  Musculoskeletal Exam No symptomatic pedal deformities noted bilateral. Muscular strength within normal limits.  ASSESSMENT 1. Onychodystrophic nails 1-5 bilateral with hyperkeratosis of nails.  2. Onychomycosis of nail due to dermatophyte bilateral 3. Pain in foot bilateral  PLAN OF CARE 1. Patient evaluated today.  2. Instructed to maintain good pedal hygiene and foot care.  3. Mechanical debridement of nails 1-5 bilaterally performed using a nail nipper. Filed with dremel without incident.  4. Return to clinic in 3 mos.    Jerime Arif M Masaichi Kracht, DPM    

## 2016-01-28 DIAGNOSIS — E538 Deficiency of other specified B group vitamins: Secondary | ICD-10-CM | POA: Diagnosis not present

## 2016-02-06 DIAGNOSIS — H401122 Primary open-angle glaucoma, left eye, moderate stage: Secondary | ICD-10-CM | POA: Diagnosis not present

## 2016-02-14 ENCOUNTER — Other Ambulatory Visit: Payer: Self-pay | Admitting: Internal Medicine

## 2016-02-28 ENCOUNTER — Other Ambulatory Visit: Payer: Self-pay | Admitting: Internal Medicine

## 2016-03-28 ENCOUNTER — Encounter: Payer: Self-pay | Admitting: Internal Medicine

## 2016-03-28 ENCOUNTER — Non-Acute Institutional Stay: Payer: Medicare Other | Admitting: Internal Medicine

## 2016-03-28 VITALS — BP 112/60 | HR 62

## 2016-03-28 DIAGNOSIS — R0789 Other chest pain: Secondary | ICD-10-CM | POA: Diagnosis not present

## 2016-03-28 NOTE — Patient Instructions (Signed)

## 2016-03-28 NOTE — Progress Notes (Signed)
Subjective:    Patient ID: Benjamin Villarreal, male    DOB: 1920/03/08, 83 y.o.   MRN: [id number]  HPI  Asked to evaluate resident in [address]. Intermittent left side chest pain x 1 year Describes the pain as sharp and stabbing, last only a few minutes before resolving Can occur every few days to multiple times a day. No associated dizziness, diaphoresis, SOB or weakness. Can not associated with eating or movement Not worse with exertion Relieved by deep breathing Reports Dr. Silvio Pate has evaluated in the past and told him he was fine Taking ASA daily, no previous heart history  Review of Systems      Past Medical History:  Diagnosis Date  . Arthritis   . Colon polyps    adenomatous  . History of fracture of tibia   . History of tachycardia   . Osteoporosis 01/2014   T -3.8 (01/2014)  . Other constipation    chronic  . Prostate cancer ([place])    surgery then lupron  . Unspecified venous (peripheral) insufficiency   . Vitamin B12 deficiency     Current Outpatient Prescriptions  Medication Sig Dispense Refill  . aspirin 81 MG EC tablet Take 81 mg by mouth every other day.     . Biotin 2.5 MG CAPS Take by mouth.    . Calcium Carbonate-Vitamin D (CALCIUM-D) 600-400 MG-UNIT TABS Take 1 tablet by mouth 2 (two) times daily.     . cholecalciferol (VITAMIN D) 1000 UNITS tablet Take 1,000 Units by mouth daily.      . fluticasone (FLONASE) 50 MCG/ACT nasal spray USE 2 SPRAYS IN EACH NOSTRIL DAILY 48 g 3  . latanoprost (XALATAN) 0.005 % ophthalmic solution Place 1 drop into both eyes at bedtime.    . polyethylene glycol powder (GLYCOLAX/MIRALAX) powder FILL TO LINE (17 GRAMS ), MIX AND DRINK TWICE A DAY AND 8.5 GRAMS  AT NOON 527 g 3  . sennosides-docusate sodium (SENOKOT-S) 8.6-50 MG tablet Take 2 tablets by mouth daily.     . Skin Protectants, Misc. (EUCERIN) cream Apply 1 application topically as needed for dry skin.    Marland Kitchen triamcinolone cream (KENALOG) 0.1 % Apply 1 application topically  every other day. Alternating with Ciclopirox    . vitamin B-12 (CYANOCOBALAMIN) 1000 MCG tablet Take 1 tablet (1,000 mcg total) by mouth daily. 90 tablet 3  . vitamin C (ASCORBIC ACID) 500 MG tablet Take 500 mg by mouth daily.    . Cholecalciferol (VITAMIN D-3) 1000 units CAPS Take by mouth.    . ciclopirox (LOPROX) 0.77 % cream Apply topically every other day. Alternating with Triamcinolone    . clindamycin (CLEOCIN) 150 MG capsule Take 150 mg by mouth. TAKES 1 HOUR PRIOR TO DENTAL WORK    . cyclobenzaprine (FLEXERIL) 5 MG tablet Take 1 tablet (5 mg total) by mouth at bedtime as needed. (Patient not taking: Reported on 01/25/2016) 90 tablet 3  . ibuprofen (ADVIL,MOTRIN) 200 MG tablet Take 400 mg by mouth 2 (two) times daily as needed.     Marland Kitchen leuprolide (LUPRON) 30 MG injection Inject 30 mg into the muscle every 4 (four) months. Dose and interval not clear yet    . loratadine (CLARITIN) 10 MG tablet TAKE 1 TABLET DAILY 90 tablet 3  . traMADol (ULTRAM) 50 MG tablet Take 1 tablet (50 mg total) by mouth 3 (three) times daily as needed. (Patient not taking: Reported on 01/25/2016) 270 tablet 0  . zoledronic acid (RECLAST) 5 MG/100ML SOLN  injection Inject 5 mg into the vein once. ?yearly     No current facility-administered medications for this visit.    Facility-Administered Medications Ordered in Other Visits  Medication Dose Route Frequency Provider Last Rate Last Dose  . leuprolide (LUPRON) injection 30 mg  30 mg Intramuscular Once Leia Alf, MD        Allergies  Allergen Reactions  . Other     RAW ONIONS- SINUS REACTION    Family History  Problem Relation Age of Onset  . Diabetes Son   . Hypertension Paternal Grandfather     Social History   Social History  . Marital status: Widowed    Spouse name: [name]  . Number of children: 3  . Years of education: N/A   Occupational History  . [employer]     retired   Social History Main Topics  . Smoking status: Never  Smoker  . Smokeless tobacco: Never Used  . Alcohol use 0.0 - 0.6 oz/week     Comment: in past  . Drug use: Unknown  . Sexual activity: Not on file   Other Topics Concern  . Not on file   Social History Narrative   Has living will   Daughter [name] holds health care POA.   Has DNR and we have reviewed this.   Would not want tube feedings if cognitively unaware     Constitutional: Denies fever, malaise, fatigue, headache or abrupt weight changes.  Respiratory: Denies difficulty breathing, shortness of breath, cough or sputum production.   Cardiovascular: Pt reports intermittent chest pain. Denies chest tightness, palpitations or swelling in the hands or feet.  Gastrointestinal: Pt reports intermittent constipation. Denies abdominal pain, bloating, diarrhea or blood in the stool.  Musculoskeletal: Denies decrease in range of motion, difficulty with gait, muscle pain or joint pain and swelling.    No other specific complaints in a complete review of systems (except as listed in HPI above).  Objective:   Physical Exam   BP 112/60   Pulse 62  Wt Readings from Last 3 Encounters:  01/24/16 180 lb (81.6 kg)  01/03/16 177 lb 11.2 oz (80.6 kg)  10/03/15 174 lb 3 oz (79 kg)    General: Appears his stated age, in NAD. Cardiovascular: Normal rate and rhythm. S1,S2 noted.  No murmur, rubs or gallops noted.  Pulmonary/Chest: Normal effort and positive vesicular breath sounds. No respiratory distress. No wheezes, rales or ronchi noted.  Abdomen: Soft and nontender.  Musculoskeletal: Chest wall nontender with palpation.  Neurological: Alert and oriented.   BMET    Component Value Date/Time   NA 136 01/03/2016 0950   NA 140 02/11/2012 0415   K 4.4 01/03/2016 0950   K 3.9 02/11/2012 0415   CL 106 01/03/2016 0950   CL 108 (H) 02/11/2012 0415   CO2 23 01/03/2016 0950   CO2 25 02/11/2012 0415   GLUCOSE 149 (H) 01/03/2016 0950   GLUCOSE 125 (H) 02/11/2012 0415   BUN 21 (H)  01/03/2016 0950   BUN 12 02/11/2012 0415   CREATININE 0.95 01/03/2016 0950   CREATININE 1.00 03/23/2014 1400   CALCIUM 9.1 01/03/2016 0950   CALCIUM 8.3 (L) 03/23/2014 1400   GFRNONAA >60 01/03/2016 0950   GFRNONAA >60 03/23/2014 1400   GFRNONAA 49 (L) 09/02/2013 1428   GFRAA >60 01/03/2016 0950   GFRAA >60 03/23/2014 1400   GFRAA 57 (L) 09/02/2013 1428    Lipid Panel  No results found for: CHOL, TRIG, HDL, CHOLHDL,  VLDL, LDLCALC  CBC    Component Value Date/Time   WBC 5.5 01/03/2016 0950   RBC 4.09 (L) 01/03/2016 0950   HGB 11.3 (L) 01/03/2016 0950   HGB 11.3 (L) 03/07/2014 1414   HCT 34.0 (L) 01/03/2016 0950   HCT 34.2 (L) 03/07/2014 1414   PLT 157 01/03/2016 0950   PLT 143 (L) 03/07/2014 1414   MCV 83.0 01/03/2016 0950   MCV 84 03/07/2014 1414   MCH 27.6 01/03/2016 0950   MCHC 33.3 01/03/2016 0950   RDW 15.3 (H) 01/03/2016 0950   RDW 15.2 (H) 03/07/2014 1414   LYMPHSABS 1.6 01/03/2016 0950   LYMPHSABS 1.8 03/07/2014 1414   MONOABS 0.5 01/03/2016 0950   MONOABS 0.5 03/07/2014 1414   EOSABS 0.3 01/03/2016 0950   EOSABS 0.3 03/07/2014 1414   BASOSABS 0.1 01/03/2016 0950   BASOSABS 0.2 (H) 03/07/2014 1414    Hgb A1C No results found for: HGBA1C         Assessment & Plan:   Left side chest pain:  Atypical ECG from 02/2015 reviewed Continue daily ASA for now No real need for cardiac workup at this time Try to keep a diary of when it occurs and what you are doing at the time Will monitor for now  Will reassess as needed [name], NP

## 2016-04-04 ENCOUNTER — Inpatient Hospital Stay: Payer: Medicare Other

## 2016-04-04 ENCOUNTER — Inpatient Hospital Stay: Payer: Medicare Other | Attending: Internal Medicine

## 2016-04-04 ENCOUNTER — Inpatient Hospital Stay (HOSPITAL_BASED_OUTPATIENT_CLINIC_OR_DEPARTMENT_OTHER): Payer: Medicare Other | Admitting: Internal Medicine

## 2016-04-04 VITALS — BP 128/58 | HR 62 | Temp 97.8°F | Wt 181.0 lb

## 2016-04-04 DIAGNOSIS — M818 Other osteoporosis without current pathological fracture: Secondary | ICD-10-CM | POA: Diagnosis not present

## 2016-04-04 DIAGNOSIS — C61 Malignant neoplasm of prostate: Secondary | ICD-10-CM | POA: Insufficient documentation

## 2016-04-04 DIAGNOSIS — D649 Anemia, unspecified: Secondary | ICD-10-CM | POA: Insufficient documentation

## 2016-04-04 DIAGNOSIS — Z86010 Personal history of colonic polyps: Secondary | ICD-10-CM | POA: Insufficient documentation

## 2016-04-04 DIAGNOSIS — R000 Tachycardia, unspecified: Secondary | ICD-10-CM

## 2016-04-04 DIAGNOSIS — K5900 Constipation, unspecified: Secondary | ICD-10-CM | POA: Diagnosis not present

## 2016-04-04 DIAGNOSIS — E538 Deficiency of other specified B group vitamins: Secondary | ICD-10-CM | POA: Insufficient documentation

## 2016-04-04 DIAGNOSIS — Z7982 Long term (current) use of aspirin: Secondary | ICD-10-CM | POA: Insufficient documentation

## 2016-04-04 DIAGNOSIS — M129 Arthropathy, unspecified: Secondary | ICD-10-CM | POA: Insufficient documentation

## 2016-04-04 DIAGNOSIS — Z8781 Personal history of (healed) traumatic fracture: Secondary | ICD-10-CM

## 2016-04-04 DIAGNOSIS — Z79899 Other long term (current) drug therapy: Secondary | ICD-10-CM

## 2016-04-04 DIAGNOSIS — I872 Venous insufficiency (chronic) (peripheral): Secondary | ICD-10-CM | POA: Diagnosis not present

## 2016-04-04 DIAGNOSIS — Z85828 Personal history of other malignant neoplasm of skin: Secondary | ICD-10-CM | POA: Diagnosis not present

## 2016-04-04 LAB — CBC WITH DIFFERENTIAL/PLATELET
Basophils Absolute: 0.1 10*3/uL (ref 0–0.1)
Basophils Relative: 2 %
EOS ABS: 0.2 10*3/uL (ref 0–0.7)
EOS PCT: 4 %
HCT: 33.9 % — ABNORMAL LOW (ref 40.0–52.0)
HEMOGLOBIN: 11.2 g/dL — AB (ref 13.0–18.0)
Lymphocytes Relative: 34 %
Lymphs Abs: 1.8 10*3/uL (ref 1.0–3.6)
MCH: 27.4 pg (ref 26.0–34.0)
MCHC: 33.1 g/dL (ref 32.0–36.0)
MCV: 82.6 fL (ref 80.0–100.0)
MONOS PCT: 9 %
Monocytes Absolute: 0.5 10*3/uL (ref 0.2–1.0)
NEUTROS PCT: 51 %
Neutro Abs: 2.8 10*3/uL (ref 1.4–6.5)
PLATELETS: 158 10*3/uL (ref 150–440)
RBC: 4.1 MIL/uL — AB (ref 4.40–5.90)
RDW: 15.2 % — ABNORMAL HIGH (ref 11.5–14.5)
WBC: 5.4 10*3/uL (ref 3.8–10.6)

## 2016-04-04 LAB — BASIC METABOLIC PANEL
Anion gap: 9 (ref 5–15)
BUN: 21 mg/dL — ABNORMAL HIGH (ref 6–20)
CHLORIDE: 106 mmol/L (ref 101–111)
CO2: 20 mmol/L — ABNORMAL LOW (ref 22–32)
CREATININE: 0.97 mg/dL (ref 0.61–1.24)
Calcium: 9 mg/dL (ref 8.9–10.3)
GFR calc Af Amer: >60 mL/min (ref >60)
Glucose, Bld: 182 mg/dL — ABNORMAL HIGH (ref 65–99)
Potassium: 4 mmol/L (ref 3.5–5.1)
SODIUM: 135 mmol/L (ref 135–145)

## 2016-04-04 LAB — PSA: PSA: 2.23 ng/mL (ref 0.00–4.00)

## 2016-04-04 MED ORDER — DENOSUMAB 60 MG/ML ~~LOC~~ SOLN
60.0000 mg | Freq: Once | SUBCUTANEOUS | Status: AC
  Administered 2016-04-04: 60 mg via SUBCUTANEOUS
  Filled 2016-04-04: qty 1

## 2016-04-04 NOTE — Progress Notes (Signed)
Benjamin Villarreal OFFICE PROGRESS NOTE  Patient Care Team: Venia Carbon, MD as PCP - General   SUMMARY OF ONCOLOGIC HISTORY: Oncology History    # 1996 PROSTATE CANCER [Gleason score 2+3] s/p Radical Prostatectomy [[facility]]; ?Biochem RECURRENT PROSTATE CANCER-Hormone sensitive;  Intermittent Lupron; on Lupron q 68M [Jan 2016 PSA- 7.9].  HOLD LUPRON in MAY 2017; AUg 2017- Re-start  # Osteoporosis [[facility]- 2015]- on Reclast q 64M; FEB 2017- Start Prolia q 6 M     Prostate cancer ([place])   03/22/2009 Initial Diagnosis    Prostate cancer ([place])       INTERVAL HISTORY:  A very pleasant 83 year old male patient with above history of prostate cancer recurrent on Intermittent Lupron every 3 months/ Prolia q 64M is here for follow-up.  He continues to live in the assisted living; is fairly active for his age.   He continues to go around with a walker. He denies any difficulty with urination. No significant hot flashes. No falls. No pain.   REVIEW OF SYSTEMS:  A complete 10 point review of system is done which is negative except mentioned above/history of present illness.   PAST MEDICAL HISTORY :  Past Medical History:  Diagnosis Date  . Arthritis   . Colon polyps    adenomatous  . History of fracture of tibia   . History of tachycardia   . Osteoporosis 01/2014   T -3.8 (01/2014)  . Other constipation    chronic  . Prostate cancer ([place])    surgery then lupron  . Unspecified venous (peripheral) insufficiency   . Vitamin B12 deficiency     PAST SURGICAL HISTORY :   Past Surgical History:  Procedure Laterality Date  . BASAL CELL CARCINOMA EXCISION  2004   Left side of face  . CATARACT EXTRACTION  2000, 2003   both eyes  . COLONOSCOPY     h/o adenomatous polyps  . JOINT REPLACEMENT  2005   Left knee  . JOINT REPLACEMENT  12/13   Right knee  . PROSTATE SURGERY  1996   cancer  . sigmoid resection    . TIBIA FRACTURE SURGERY  1999  . VOLVULUS REDUCTION  12/10     FAMILY HISTORY :   Family History  Problem Relation Age of Onset  . Diabetes Son   . Hypertension Paternal Grandfather     SOCIAL HISTORY:   Social History  Substance Use Topics  . Smoking status: Never Smoker  . Smokeless tobacco: Never Used  . Alcohol use 0.0 - 0.6 oz/week     Comment: in past    ALLERGIES:  is allergic to other.  MEDICATIONS:  Current Outpatient Prescriptions  Medication Sig Dispense Refill  . aspirin 81 MG EC tablet Take 81 mg by mouth every other day.     . Biotin 2.5 MG CAPS Take by mouth.    . Calcium Carbonate-Vitamin D (CALCIUM-D) 600-400 MG-UNIT TABS Take 1 tablet by mouth 2 (two) times daily.     . cholecalciferol (VITAMIN D) 1000 UNITS tablet Take 1,000 Units by mouth daily.      . Cholecalciferol (VITAMIN D-3) 1000 units CAPS Take by mouth.    . fluticasone (FLONASE) 50 MCG/ACT nasal spray USE 2 SPRAYS IN EACH NOSTRIL DAILY 48 g 3  . latanoprost (XALATAN) 0.005 % ophthalmic solution Place 1 drop into both eyes at bedtime.    Marland Kitchen leuprolide (LUPRON) 30 MG injection Inject 30 mg into the muscle every 4 (four) months. Dose  and interval not clear yet    . loratadine (CLARITIN) 10 MG tablet TAKE 1 TABLET DAILY 90 tablet 3  . polyethylene glycol powder (GLYCOLAX/MIRALAX) powder FILL TO LINE (17 GRAMS ), MIX AND DRINK TWICE A DAY AND 8.5 GRAMS  AT NOON 527 g 3  . sennosides-docusate sodium (SENOKOT-S) 8.6-50 MG tablet Take 2 tablets by mouth daily.     . Skin Protectants, Misc. (EUCERIN) cream Apply 1 application topically as needed for dry skin.    Marland Kitchen triamcinolone cream (KENALOG) 0.1 % Apply 1 application topically every other day. Alternating with Ciclopirox    . vitamin B-12 (CYANOCOBALAMIN) 1000 MCG tablet Take 1 tablet (1,000 mcg total) by mouth daily. 90 tablet 3  . vitamin C (ASCORBIC ACID) 500 MG tablet Take 500 mg by mouth daily.    . zoledronic acid (RECLAST) 5 MG/100ML SOLN injection Inject 5 mg into the vein once. ?yearly     No current  facility-administered medications for this visit.    Facility-Administered Medications Ordered in Other Visits  Medication Dose Route Frequency Provider Last Rate Last Dose  . leuprolide (LUPRON) injection 30 mg  30 mg Intramuscular Once Leia Alf, MD        PHYSICAL EXAMINATION: ECOG PERFORMANCE STATUS: 1 - Symptomatic but completely ambulatory  BP (!) 128/58 (BP Location: Left Arm, Patient Position: Sitting)   Pulse 62   Temp 97.8 F (36.6 C) (Tympanic)   Wt 181 lb (82.1 kg)   BMI 22.93 kg/m   Filed Weights   04/04/16 1031  Weight: 181 lb (82.1 kg)    GENERAL: Well-nourished well-developed; Alert, no distress and comfortable.  Alone; walks with a rolling walker EYES: no pallor or icterus OROPHARYNX: no thrush or ulceration; good dentition  NECK: supple, no masses felt LYMPH:  no palpable lymphadenopathy in the cervical, axillary or inguinal regions LUNGS: clear to auscultation and  No wheeze or crackles HEART/CVS: regular rate & rhythm and no murmurs; No lower extremity edema ABDOMEN:abdomen soft, non-tender and normal bowel sounds Musculoskeletal:no cyanosis of digits and no clubbing  PSYCH: alert & oriented x 3 with fluent speech NEURO: no focal motor/sensory deficits SKIN:  no rashes or significant lesions  LABORATORY DATA:  I have reviewed the data as listed    Component Value Date/Time   NA 135 04/04/2016 0956   NA 140 02/11/2012 0415   K 4.0 04/04/2016 0956   K 3.9 02/11/2012 0415   CL 106 04/04/2016 0956   CL 108 (H) 02/11/2012 0415   CO2 20 (L) 04/04/2016 0956   CO2 25 02/11/2012 0415   GLUCOSE 182 (H) 04/04/2016 0956   GLUCOSE 125 (H) 02/11/2012 0415   BUN 21 (H) 04/04/2016 0956   BUN 12 02/11/2012 0415   CREATININE 0.97 04/04/2016 0956   CREATININE 1.00 03/23/2014 1400   CALCIUM 9.0 04/04/2016 0956   CALCIUM 8.3 (L) 03/23/2014 1400   PROT 7.3 01/03/2016 0950   PROT 7.0 03/07/2014 1414   ALBUMIN 4.2 01/03/2016 0950   ALBUMIN 3.7 03/07/2014  1414   AST 24 01/03/2016 0950   AST 16 03/07/2014 1414   ALT 13 (L) 01/03/2016 0950   ALT 19 03/07/2014 1414   ALKPHOS 49 01/03/2016 0950   ALKPHOS 69 03/07/2014 1414   BILITOT 0.5 01/03/2016 0950   BILITOT 0.3 03/07/2014 1414   GFRNONAA >60 04/04/2016 0956   GFRNONAA >60 03/23/2014 1400   GFRNONAA 49 (L) 09/02/2013 1428   GFRAA >60 04/04/2016 0956   GFRAA >60 03/23/2014 1400  GFRAA 57 (L) 09/02/2013 1428    No results found for: SPEP, UPEP  Lab Results  Component Value Date   WBC 5.4 04/04/2016   NEUTROABS 2.8 04/04/2016   HGB 11.2 (L) 04/04/2016   HCT 33.9 (L) 04/04/2016   MCV 82.6 04/04/2016   PLT 158 04/04/2016      Chemistry      Component Value Date/Time   NA 135 04/04/2016 0956   NA 140 02/11/2012 0415   K 4.0 04/04/2016 0956   K 3.9 02/11/2012 0415   CL 106 04/04/2016 0956   CL 108 (H) 02/11/2012 0415   CO2 20 (L) 04/04/2016 0956   CO2 25 02/11/2012 0415   BUN 21 (H) 04/04/2016 0956   BUN 12 02/11/2012 0415   CREATININE 0.97 04/04/2016 0956   CREATININE 1.00 03/23/2014 1400      Component Value Date/Time   CALCIUM 9.0 04/04/2016 0956   CALCIUM 8.3 (L) 03/23/2014 1400   ALKPHOS 49 01/03/2016 0950   ALKPHOS 69 03/07/2014 1414   AST 24 01/03/2016 0950   AST 16 03/07/2014 1414   ALT 13 (L) 01/03/2016 0950   ALT 19 03/07/2014 1414   BILITOT 0.5 01/03/2016 0950   BILITOT 0.3 03/07/2014 1414     Results for Benjamin, Villarreal (MRN [id number]) as of 04/04/2016 10:40  Ref. Range 11/20/2014 10:35 03/28/2015 10:10 06/25/2015 10:10 10/03/2015 10:22 01/03/2016 09:50  PSA Latest Ref Range: 0.00 - 4.00 ng/mL 3.33 3.20 2.50 2.19 2.35       ASSESSMENT & PLAN:   Prostate cancer [facility]) # Prostate cancer recurrent ? Biochemical recurrence versus metastatic disease [no bone scans done]- on Lupron every 3 months.  PSA Nov 9th 2017- 2.35; awaiting PSA from today.   # Clinically no evidence of any obvious progression. Discussed re: Intermittent ADT; especially given his age/  osteoporosis. Will HOLD Lupron today.   # Osteoporosis- Reviewed the bone density from 2015 - severe osteoporosis. on Prolia q 6 M- started in feb 2017. Again due today.    # Mild Anemia Hb 11- stable for now.   # follow up MD/Lupron  in 3 months. Rn will relay results of PSA from today to pt.     Cammie Sickle, MD 04/04/2016 5:15 PM

## 2016-04-04 NOTE — Assessment & Plan Note (Signed)
#   Prostate cancer recurrent ? Biochemical recurrence versus metastatic disease [no bone scans done]- on Lupron every 3 months.  PSA Nov 9th 2017- 2.35; awaiting PSA from today.   # Clinically no evidence of any obvious progression. Discussed re: Intermittent ADT; especially given his age/ osteoporosis. Will HOLD Lupron today.   # Osteoporosis- Reviewed the bone density from 2015 - severe osteoporosis. on Prolia q 6 M- started in feb 2017. Again due today.    # Mild Anemia Hb 11- stable for now.   # follow up MD/Lupron  in 3 months. Rn will relay results of PSA from today to pt.

## 2016-04-04 NOTE — Progress Notes (Signed)
Received a call from [place], [place] patient is only getting the Prolia injection today.  No Lupron injection today.

## 2016-04-04 NOTE — Progress Notes (Signed)
Patient here today for follow up.   

## 2016-04-08 ENCOUNTER — Telehealth: Payer: Self-pay

## 2016-04-08 NOTE — Telephone Encounter (Signed)
We received a fax request from [facility] asking to refill his tramadol through [facility]. It is not currently on his medication list. Will need orders signed for [facility] and a need rx to fax. It can be faxed with our cover sheet

## 2016-04-09 MED ORDER — TRAMADOL HCL 50 MG PO TABS: 50.0000 mg | ORAL_TABLET | Freq: Three times a day (TID) | ORAL | 0 refills | Status: DC | PRN

## 2016-04-09 NOTE — Telephone Encounter (Deleted)
Patient is requesting refills on his Tramadol be sent in to [facility].  His bottle expired in 2014.

## 2016-04-09 NOTE — Telephone Encounter (Signed)
rx signed and faxed to [facility]

## 2016-04-09 NOTE — Telephone Encounter (Signed)
Someone else must have deleted it. As far as I know, [facility] should still have the order

## 2016-04-09 NOTE — Telephone Encounter (Signed)
ERRONEOUS ENCOUNTER

## 2016-04-10 ENCOUNTER — Other Ambulatory Visit: Payer: Self-pay | Admitting: Internal Medicine

## 2016-04-14 ENCOUNTER — Telehealth: Payer: Self-pay | Admitting: Internal Medicine

## 2016-04-14 NOTE — Telephone Encounter (Signed)
Please see if they are willing to fill the #90 again. He uses it prn and that is why it has lasted so long. He may have had some filled with pharmacare as well (since he resides in assisted living and meds filled by contract pharmacy)--this is not a new medication

## 2016-04-14 NOTE — Telephone Encounter (Signed)
[facility] called regarding the pts tramadol rx.  They noticed that the last time this med was filled was in Feb 2017 for 90 pills.  They have a new initiative for opiod therapy and recommend that the initial fill be limited to 7 pills and in this case that would be 21 pills.  They consider this a new therapy because it has been a year since the last fill.  They are requesting permission to dispense 21 pills and if the patient needs more that can be done on subsequent fills.  Please call them at [phone] and ref # [id number].

## 2016-04-14 NOTE — Telephone Encounter (Signed)
Left message on vm at the pharmacy to continue #90

## 2016-04-27 ENCOUNTER — Other Ambulatory Visit: Payer: Self-pay | Admitting: Internal Medicine

## 2016-05-12 ENCOUNTER — Encounter: Payer: Self-pay | Admitting: Podiatry

## 2016-05-12 ENCOUNTER — Ambulatory Visit (INDEPENDENT_AMBULATORY_CARE_PROVIDER_SITE_OTHER): Payer: Medicare Other | Admitting: Podiatry

## 2016-05-12 DIAGNOSIS — M79676 Pain in unspecified toe(s): Secondary | ICD-10-CM

## 2016-05-12 DIAGNOSIS — M79609 Pain in unspecified limb: Secondary | ICD-10-CM | POA: Diagnosis not present

## 2016-05-12 DIAGNOSIS — B351 Tinea unguium: Secondary | ICD-10-CM

## 2016-05-12 NOTE — Progress Notes (Signed)
Complaint:  Visit Type: Patient returns to my office for continued preventative foot care services. Complaint: Patient states" my nails have grown long and thick and become painful to walk and wear shoes" Patient has been diagnosed with neuropathy.. The patient presents for preventative foot care services. No changes to ROS  Podiatric Exam: Vascular: dorsalis pedis and posterior tibial pulses are barely  palpable bilateral. Capillary return is immediate. Temperature gradient is WNL. Skin turgor WNL  Sensorium: Diminished  Semmes Weinstein monofilament test. Normal tactile sensation bilaterally. Nail Exam: Pt has thick disfigured discolored nails with subungual debris noted bilateral entire nail hallux through fifth toenails Ulcer Exam: There is no evidence of ulcer or pre-ulcerative changes or infection. Orthopedic Exam: Muscle tone and strength are WNL. No limitations in general ROM. No crepitus or effusions noted. Foot type and digits show no abnormalities.  Rearfoot  DJD noted  B/L Skin: No Porokeratosis. No infection or ulcers  Diagnosis:  Onychomycosis, , Pain in right toe, pain in left toes  Treatment & Plan Procedures and Treatment: Consent by patient was obtained for treatment procedures. The patient understood the discussion of treatment and procedures well. All questions were answered thoroughly reviewed. Debridement of mycotic and hypertrophic toenails, 1 through 5 bilateral and clearing of subungual debris. No ulceration, no infection noted.  Return Visit-Office Procedure: Patient instructed to return to the office for a follow up visit 3 months for continued evaluation and treatment.    Gardiner Barefoot DPM

## 2016-05-22 ENCOUNTER — Encounter: Payer: Self-pay | Admitting: Internal Medicine

## 2016-05-22 ENCOUNTER — Non-Acute Institutional Stay: Payer: Medicare Other | Admitting: Internal Medicine

## 2016-05-22 VITALS — BP 130/70 | HR 80 | Temp 97.6°F | Resp 18 | Wt 181.0 lb

## 2016-05-22 DIAGNOSIS — G463 Brain stem stroke syndrome: Secondary | ICD-10-CM | POA: Diagnosis not present

## 2016-05-22 DIAGNOSIS — K5909 Other constipation: Secondary | ICD-10-CM | POA: Diagnosis not present

## 2016-05-22 DIAGNOSIS — I872 Venous insufficiency (chronic) (peripheral): Secondary | ICD-10-CM | POA: Diagnosis not present

## 2016-05-22 DIAGNOSIS — C61 Malignant neoplasm of prostate: Secondary | ICD-10-CM

## 2016-05-22 DIAGNOSIS — G629 Polyneuropathy, unspecified: Secondary | ICD-10-CM | POA: Diagnosis not present

## 2016-05-22 NOTE — Assessment & Plan Note (Signed)
No evidence of progression No bone pain Voids okay lupron held this past time

## 2016-05-22 NOTE — Assessment & Plan Note (Signed)
Vertigo is rare Still has trouble totally extending his neck (affects his balance)

## 2016-05-22 NOTE — Assessment & Plan Note (Signed)
In feet Affects his balance but he does well with the rollator On B12

## 2016-05-22 NOTE — Progress Notes (Signed)
Subjective:    Patient ID: Benjamin Villarreal, male    DOB: 03-16-1920, 83 y.o.   MRN: [id number]  HPI Visit in assisted living for follow up of chronic health  Reviewed status with Luellen Pucker RN  Feels good He notes on bump on top of his head---noticed it recently No pain or discharge  Saw oncologist last month---deferred lupron No bone pain Voids urine okay  Tries to walk daily As much as a mile a day Some knee pain Uses rollator for balance Notices his foot doesn't pick up and he hits the tip up---trouble with dorsiflexion Ongoing numbness and tingling in feet--despite B12 therapy  Bowels are moving fine on his regimen Satisfied with this  Chronic edema controlled with support hose No SOB No recurrence of chest pain  Current Outpatient Prescriptions on File Prior to Visit  Medication Sig Dispense Refill  . aspirin 81 MG EC tablet Take 81 mg by mouth every other day.     . Biotin 2.5 MG CAPS Take by mouth.    . Calcium Carbonate-Vitamin D (CALCIUM-D) 600-400 MG-UNIT TABS Take 1 tablet by mouth 2 (two) times daily.     . cholecalciferol (VITAMIN D) 1000 UNITS tablet Take 1,000 Units by mouth daily.      . Cholecalciferol (VITAMIN D-3) 1000 units CAPS Take by mouth.    . fluticasone (FLONASE) 50 MCG/ACT nasal spray USE 2 SPRAYS IN EACH NOSTRIL DAILY 48 g 3  . latanoprost (XALATAN) 0.005 % ophthalmic solution Place 1 drop into both eyes at bedtime.    Marland Kitchen leuprolide (LUPRON) 30 MG injection Inject 30 mg into the muscle every 4 (four) months. Dose and interval not clear yet    . loratadine (CLARITIN) 10 MG tablet TAKE 1 TABLET DAILY 90 tablet 3  . polyethylene glycol powder (GLYCOLAX/MIRALAX) powder FILL TO LINE (17 GRAMS ), MIX AND DRINK TWICE A DAY AND 8.5 GRAMS  AT NOON 527 g 3  . sennosides-docusate sodium (SENOKOT-S) 8.6-50 MG tablet Take 2 tablets by mouth daily.     . Skin Protectants, Misc. (EUCERIN) cream Apply 1 application topically as needed for dry skin.    Marland Kitchen traMADol  (ULTRAM) 50 MG tablet Take 1 tablet (50 mg total) by mouth 3 (three) times daily as needed. 270 tablet 0  . triamcinolone cream (KENALOG) 0.1 % Apply 1 application topically every other day. Alternating with Ciclopirox    . vitamin B-12 (CYANOCOBALAMIN) 1000 MCG tablet Take 1 tablet (1,000 mcg total) by mouth daily. 90 tablet 3  . vitamin C (ASCORBIC ACID) 500 MG tablet Take 500 mg by mouth daily.    . zoledronic acid (RECLAST) 5 MG/100ML SOLN injection Inject 5 mg into the vein once. ?yearly     Current Facility-Administered Medications on File Prior to Visit  Medication Dose Route Frequency Provider Last Rate Last Dose  . leuprolide (LUPRON) injection 30 mg  30 mg Intramuscular Once Leia Alf, MD        Allergies  Allergen Reactions  . Other     RAW ONIONS- SINUS REACTION    Past Medical History:  Diagnosis Date  . Arthritis   . Colon polyps    adenomatous  . History of fracture of tibia   . History of tachycardia   . Osteoporosis 01/2014   T -3.8 (01/2014)  . Other constipation    chronic  . Prostate cancer ([place])    surgery then lupron  . Unspecified venous (peripheral) insufficiency   . Vitamin B12 deficiency  Past Surgical History:  Procedure Laterality Date  . BASAL CELL CARCINOMA EXCISION  2004   Left side of face  . CATARACT EXTRACTION  2000, 2003   both eyes  . COLONOSCOPY     h/o adenomatous polyps  . JOINT REPLACEMENT  2005   Left knee  . JOINT REPLACEMENT  12/13   Right knee  . PROSTATE SURGERY  1996   cancer  . sigmoid resection    . TIBIA FRACTURE SURGERY  1999  . VOLVULUS REDUCTION  12/10    Family History  Problem Relation Age of Onset  . Diabetes Son   . Hypertension Paternal Grandfather     Social History   Social History  . Marital status: Widowed    Spouse name: [name]  . Number of children: 3  . Years of education: N/A   Occupational History  . [employer]     retired   Social History Main Topics  . Smoking  status: Never Smoker  . Smokeless tobacco: Never Used  . Alcohol use 0.0 - 0.6 oz/week     Comment: in past  . Drug use: Unknown  . Sexual activity: Not on file   Other Topics Concern  . Not on file   Social History Narrative   Has living will   Daughter [name] holds health care POA.   Has DNR and we have reviewed this.   Would not want tube feedings if cognitively unaware   Review of Systems  Appetite is good Weight stable Sleeps fairly well No heartburn or dysphagia Sinus drainage---on allergy regimen No recent vertigo     Objective:   Physical Exam  Constitutional: He appears well-nourished. No distress.  Neck: No thyromegaly present.  Cardiovascular: Normal rate, regular rhythm and normal heart sounds.  Exam reveals no gallop.   No murmur heard. Pulmonary/Chest: Effort normal and breath sounds normal. No respiratory distress. He has no wheezes. He has no rales.  Abdominal: Soft. There is no tenderness.  Musculoskeletal:  Trace edema  Lymphadenopathy:    He has no cervical adenopathy.  Skin: No rash noted.          Assessment & Plan:

## 2016-05-22 NOTE — Assessment & Plan Note (Signed)
Satisfied with his regimen

## 2016-05-22 NOTE — Assessment & Plan Note (Signed)
Okay with support hose

## 2016-06-06 DIAGNOSIS — H401122 Primary open-angle glaucoma, left eye, moderate stage: Secondary | ICD-10-CM | POA: Diagnosis not present

## 2016-06-11 DIAGNOSIS — H40003 Preglaucoma, unspecified, bilateral: Secondary | ICD-10-CM | POA: Diagnosis not present

## 2016-06-30 DIAGNOSIS — I10 Essential (primary) hypertension: Secondary | ICD-10-CM | POA: Diagnosis not present

## 2016-06-30 LAB — BASIC METABOLIC PANEL: Glucose: 114 mg/dL

## 2016-07-02 ENCOUNTER — Inpatient Hospital Stay: Payer: Medicare Other

## 2016-07-02 ENCOUNTER — Inpatient Hospital Stay (HOSPITAL_BASED_OUTPATIENT_CLINIC_OR_DEPARTMENT_OTHER): Payer: Medicare Other | Admitting: Internal Medicine

## 2016-07-02 ENCOUNTER — Inpatient Hospital Stay: Payer: Medicare Other | Attending: Internal Medicine

## 2016-07-02 ENCOUNTER — Telehealth: Payer: Self-pay | Admitting: *Deleted

## 2016-07-02 ENCOUNTER — Ambulatory Visit: Payer: Medicare Other

## 2016-07-02 ENCOUNTER — Ambulatory Visit: Payer: Medicare Other | Admitting: Internal Medicine

## 2016-07-02 ENCOUNTER — Other Ambulatory Visit: Payer: Medicare Other

## 2016-07-02 VITALS — BP 119/56 | HR 76 | Temp 98.4°F | Resp 16 | Wt 179.2 lb

## 2016-07-02 DIAGNOSIS — I872 Venous insufficiency (chronic) (peripheral): Secondary | ICD-10-CM | POA: Insufficient documentation

## 2016-07-02 DIAGNOSIS — D649 Anemia, unspecified: Secondary | ICD-10-CM

## 2016-07-02 DIAGNOSIS — K5900 Constipation, unspecified: Secondary | ICD-10-CM

## 2016-07-02 DIAGNOSIS — Z8781 Personal history of (healed) traumatic fracture: Secondary | ICD-10-CM | POA: Insufficient documentation

## 2016-07-02 DIAGNOSIS — E538 Deficiency of other specified B group vitamins: Secondary | ICD-10-CM | POA: Insufficient documentation

## 2016-07-02 DIAGNOSIS — Z9079 Acquired absence of other genital organ(s): Secondary | ICD-10-CM | POA: Insufficient documentation

## 2016-07-02 DIAGNOSIS — C61 Malignant neoplasm of prostate: Secondary | ICD-10-CM | POA: Diagnosis not present

## 2016-07-02 DIAGNOSIS — M129 Arthropathy, unspecified: Secondary | ICD-10-CM

## 2016-07-02 DIAGNOSIS — Z79899 Other long term (current) drug therapy: Secondary | ICD-10-CM

## 2016-07-02 DIAGNOSIS — Z86010 Personal history of colonic polyps: Secondary | ICD-10-CM | POA: Insufficient documentation

## 2016-07-02 DIAGNOSIS — M810 Age-related osteoporosis without current pathological fracture: Secondary | ICD-10-CM | POA: Diagnosis not present

## 2016-07-02 DIAGNOSIS — D638 Anemia in other chronic diseases classified elsewhere: Secondary | ICD-10-CM

## 2016-07-02 LAB — COMPREHENSIVE METABOLIC PANEL
ALBUMIN: 3.9 g/dL (ref 3.5–5.0)
ALK PHOS: 63 U/L (ref 38–126)
ALT: 12 U/L — AB (ref 17–63)
ANION GAP: 8 (ref 5–15)
AST: 24 U/L (ref 15–41)
BUN: 19 mg/dL (ref 6–20)
CALCIUM: 9 mg/dL (ref 8.9–10.3)
CO2: 21 mmol/L — AB (ref 22–32)
Chloride: 104 mmol/L (ref 101–111)
Creatinine, Ser: 0.86 mg/dL (ref 0.61–1.24)
GFR calc Af Amer: >60 mL/min (ref >60)
GFR calc non Af Amer: >60 mL/min (ref >60)
GLUCOSE: 151 mg/dL — AB (ref 65–99)
Potassium: 4.2 mmol/L (ref 3.5–5.1)
SODIUM: 133 mmol/L — AB (ref 135–145)
Total Bilirubin: 0.5 mg/dL (ref 0.3–1.2)
Total Protein: 7.7 g/dL (ref 6.5–8.1)

## 2016-07-02 LAB — CBC WITH DIFFERENTIAL/PLATELET
BASOS ABS: 0.1 10*3/uL (ref 0–0.1)
BASOS PCT: 1 %
EOS ABS: 0.6 10*3/uL (ref 0–0.7)
Eosinophils Relative: 11 %
HEMATOCRIT: 32.7 % — AB (ref 40.0–52.0)
HEMOGLOBIN: 10.9 g/dL — AB (ref 13.0–18.0)
Lymphocytes Relative: 27 %
Lymphs Abs: 1.6 10*3/uL (ref 1.0–3.6)
MCH: 27.2 pg (ref 26.0–34.0)
MCHC: 33.3 g/dL (ref 32.0–36.0)
MCV: 81.7 fL (ref 80.0–100.0)
Monocytes Absolute: 0.8 10*3/uL (ref 0.2–1.0)
Monocytes Relative: 13 %
NEUTROS ABS: 2.9 10*3/uL (ref 1.4–6.5)
NEUTROS PCT: 48 %
Platelets: 177 10*3/uL (ref 150–440)
RBC: 4 MIL/uL — AB (ref 4.40–5.90)
RDW: 15.3 % — ABNORMAL HIGH (ref 11.5–14.5)
WBC: 6 10*3/uL (ref 3.8–10.6)

## 2016-07-02 LAB — IRON AND TIBC
Iron: 28 ug/dL — ABNORMAL LOW (ref 45–182)
Saturation Ratios: 7 % — ABNORMAL LOW (ref 17.9–39.5)
TIBC: 412 ug/dL (ref 250–450)
UIBC: 384 ug/dL

## 2016-07-02 LAB — PSA: PSA: 3.98 ng/mL (ref 0.00–4.00)

## 2016-07-02 LAB — FERRITIN: Ferritin: 11 ng/mL — ABNORMAL LOW (ref 24–336)

## 2016-07-02 NOTE — Telephone Encounter (Signed)
-----   Message from Cammie Sickle, MD sent at 07/02/2016  4:35 PM EDT ----- Please inform patient that he is low on iron; and we need to rule out loss of blood; recommend UA; stool occult 3. Also recommend by mouth iron twice a day [OTC]- if having issues then recommend IV iron.

## 2016-07-02 NOTE — Progress Notes (Signed)
Patient here today for follow up.   

## 2016-07-02 NOTE — Telephone Encounter (Signed)
Discussed UA collection and stool cards x 3. Pt gave verbal understanding. He states that due to transportation he is unable to come back to the clinic for collection of the UA until next Monday 07/07/16. He was given verbal understanding to start oral iron 325 mg 1 tablet twice a day. I explained to him that the oral iron could cause constipation and GI distress. If he is unable to tolerate the iron tablets, then Dr. B could discuss another option to receive the iron in an IV. He thanked me for calling him and agreed to come on Monday 07/07/16 at 10 am.

## 2016-07-02 NOTE — Assessment & Plan Note (Addendum)
#   Prostate cancer recurrent ? Biochemical recurrence versus metastatic disease [no bone scans done]- on Lupron every 3 months [last NOV 2017- sec to osteoporosis].  PSA Feb 9th 2017- 2.35; awaiting PSA from today.   # Clinically no evidence of any obvious progression. Discussed re: Intermittent ADT; especially given his age/ osteoporosis. Will HOLD Lupron today.   # Osteoporosis- Reviewed the bone density from 2015 - severe osteoporosis. on Prolia q 6 M- started in feb 2017. Again due in 3 months.  # Mild Anemia Hb 10.9/chronic; will check iron studies/ ferritin today.   # follow up MD/Lupron/prolia  in 3 months. RN will relay results of PSA from today to pt.

## 2016-07-02 NOTE — Progress Notes (Signed)
[place] OFFICE PROGRESS NOTE  Patient Care Team: Venia Carbon, MD as PCP - General   SUMMARY OF ONCOLOGIC HISTORY: Oncology History    # 1996 PROSTATE CANCER [Gleason score 2+3] s/p Radical Prostatectomy [[facility]]; ?Biochem RECURRENT PROSTATE CANCER-Hormone sensitive;  Intermittent Lupron; on Lupron q 31M [Jan 2016 PSA- 7.9].  HOLD LUPRON in MAY 2017; AUg 2017- Re-start  # Osteoporosis [[facility]- 2015]- on Reclast q 38M; FEB 2017- Start Prolia q 6 M     Prostate cancer ([place])   03/22/2009 Initial Diagnosis    Prostate cancer ([place])       INTERVAL HISTORY:  A very pleasant 83 year old male patient with above history of prostate cancer recurrent on Intermittent Lupron every 3 months/ Prolia q 38M is here for follow-up.  He continues to live in the assisted living; is fairly active for his age.   Patient denies any recent hospitalizations. Appetite is good. No weight loss. No hot flashes. He feels fairly well for his age. Denies any difficulty urination. No jaw pain. Denies any blood in stools or black stools.   REVIEW OF SYSTEMS:  A complete 10 point review of system is done which is negative except mentioned above/history of present illness.   PAST MEDICAL HISTORY :  Past Medical History:  Diagnosis Date  . Arthritis   . Colon polyps    adenomatous  . History of fracture of tibia   . History of tachycardia   . Osteoporosis 01/2014   T -3.8 (01/2014)  . Other constipation    chronic  . Prostate cancer ([place])    surgery then lupron  . Unspecified venous (peripheral) insufficiency   . Vitamin B12 deficiency     PAST SURGICAL HISTORY :   Past Surgical History:  Procedure Laterality Date  . BASAL CELL CARCINOMA EXCISION  2004   Left side of face  . CATARACT EXTRACTION  2000, 2003   both eyes  . COLONOSCOPY     h/o adenomatous polyps  . JOINT REPLACEMENT  2005   Left knee  . JOINT REPLACEMENT  12/13   Right knee  . PROSTATE SURGERY  1996   cancer  .  sigmoid resection    . TIBIA FRACTURE SURGERY  1999  . VOLVULUS REDUCTION  12/10    FAMILY HISTORY :   Family History  Problem Relation Age of Onset  . Diabetes Son   . Hypertension Paternal Grandfather     SOCIAL HISTORY:   Social History  Substance Use Topics  . Smoking status: Never Smoker  . Smokeless tobacco: Never Used  . Alcohol use 0.0 - 0.6 oz/week     Comment: in past    ALLERGIES:  is allergic to other.  MEDICATIONS:  Current Outpatient Prescriptions  Medication Sig Dispense Refill  . aspirin 81 MG EC tablet Take 81 mg by mouth every other day.     . Biotin 2.5 MG CAPS Take by mouth.    . Calcium Carbonate-Vitamin D (CALCIUM-D) 600-400 MG-UNIT TABS Take 1 tablet by mouth 2 (two) times daily.     . cholecalciferol (VITAMIN D) 1000 UNITS tablet Take 1,000 Units by mouth daily.      . Cholecalciferol (VITAMIN D-3) 1000 units CAPS Take by mouth.    . fluticasone (FLONASE) 50 MCG/ACT nasal spray USE 2 SPRAYS IN EACH NOSTRIL DAILY 48 g 3  . latanoprost (XALATAN) 0.005 % ophthalmic solution Place 1 drop into both eyes at bedtime.    Marland Kitchen leuprolide (LUPRON)  30 MG injection Inject 30 mg into the muscle every 4 (four) months. Dose and interval not clear yet    . loratadine (CLARITIN) 10 MG tablet TAKE 1 TABLET DAILY 90 tablet 3  . polyethylene glycol powder (GLYCOLAX/MIRALAX) powder FILL TO LINE (17 GRAMS ), MIX AND DRINK TWICE A DAY AND 8.5 GRAMS  AT NOON 527 g 3  . sennosides-docusate sodium (SENOKOT-S) 8.6-50 MG tablet Take 2 tablets by mouth daily.     . Skin Protectants, Misc. (EUCERIN) cream Apply 1 application topically as needed for dry skin.    Marland Kitchen traMADol (ULTRAM) 50 MG tablet Take 1 tablet (50 mg total) by mouth 3 (three) times daily as needed. 270 tablet 0  . triamcinolone cream (KENALOG) 0.1 % Apply 1 application topically every other day. Alternating with Ciclopirox    . vitamin B-12 (CYANOCOBALAMIN) 1000 MCG tablet Take 1 tablet (1,000 mcg total) by mouth daily.  90 tablet 3  . vitamin C (ASCORBIC ACID) 500 MG tablet Take 500 mg by mouth daily.    . clindamycin (CLEOCIN) 150 MG capsule Take 150 mg by mouth. Take 4 capsules prior to dental work    . cyclobenzaprine (FLEXERIL) 5 MG tablet Take 5 mg by mouth as needed.    Marland Kitchen ibuprofen (ADVIL,MOTRIN) 200 MG tablet Take 400 mg by mouth daily as needed.    . zoledronic acid (RECLAST) 5 MG/100ML SOLN injection Inject 5 mg into the vein once. ?yearly     No current facility-administered medications for this visit.    Facility-Administered Medications Ordered in Other Visits  Medication Dose Route Frequency Provider Last Rate Last Dose  . leuprolide (LUPRON) injection 30 mg  30 mg Intramuscular Once Leia Alf, MD        PHYSICAL EXAMINATION: ECOG PERFORMANCE STATUS: 1 - Symptomatic but completely ambulatory  BP (!) 119/56 (BP Location: Right Arm, Patient Position: Sitting)   Pulse 76   Temp 98.4 F (36.9 C) (Tympanic)   Resp 16   Wt 179 lb 4 oz (81.3 kg)   BMI 22.71 kg/m   Filed Weights   07/02/16 1415  Weight: 179 lb 4 oz (81.3 kg)    GENERAL: Well-nourished well-developed; Alert, no distress and comfortable.  Alone; walks with a rolling walker EYES: no pallor or icterus OROPHARYNX: no thrush or ulceration; good dentition  NECK: supple, no masses felt LYMPH:  no palpable lymphadenopathy in the cervical, axillary or inguinal regions LUNGS: clear to auscultation and  No wheeze or crackles HEART/CVS: regular rate & rhythm and no murmurs; No lower extremity edema ABDOMEN:abdomen soft, non-tender and normal bowel sounds Musculoskeletal:no cyanosis of digits and no clubbing  PSYCH: alert & oriented x 3 with fluent speech NEURO: no focal motor/sensory deficits SKIN:  no rashes or significant lesions  LABORATORY DATA:  I have reviewed the data as listed    Component Value Date/Time   NA 133 (L) 07/02/2016 1344   NA 140 02/11/2012 0415   K 4.2 07/02/2016 1344   K 3.9 02/11/2012 0415    CL 104 07/02/2016 1344   CL 108 (H) 02/11/2012 0415   CO2 21 (L) 07/02/2016 1344   CO2 25 02/11/2012 0415   GLUCOSE 151 (H) 07/02/2016 1344   GLUCOSE 125 (H) 02/11/2012 0415   BUN 19 07/02/2016 1344   BUN 12 02/11/2012 0415   CREATININE 0.86 07/02/2016 1344   CREATININE 1.00 03/23/2014 1400   CALCIUM 9.0 07/02/2016 1344   CALCIUM 8.3 (L) 03/23/2014 1400   PROT 7.7  07/02/2016 1344   PROT 7.0 03/07/2014 1414   ALBUMIN 3.9 07/02/2016 1344   ALBUMIN 3.7 03/07/2014 1414   AST 24 07/02/2016 1344   AST 16 03/07/2014 1414   ALT 12 (L) 07/02/2016 1344   ALT 19 03/07/2014 1414   ALKPHOS 63 07/02/2016 1344   ALKPHOS 69 03/07/2014 1414   BILITOT 0.5 07/02/2016 1344   BILITOT 0.3 03/07/2014 1414   GFRNONAA >60 07/02/2016 1344   GFRNONAA >60 03/23/2014 1400   GFRNONAA 49 (L) 09/02/2013 1428   GFRAA >60 07/02/2016 1344   GFRAA >60 03/23/2014 1400   GFRAA 57 (L) 09/02/2013 1428    No results found for: SPEP, UPEP  Lab Results  Component Value Date   WBC 6.0 07/02/2016   NEUTROABS 2.9 07/02/2016   HGB 10.9 (L) 07/02/2016   HCT 32.7 (L) 07/02/2016   MCV 81.7 07/02/2016   PLT 177 07/02/2016      Chemistry      Component Value Date/Time   NA 133 (L) 07/02/2016 1344   NA 140 02/11/2012 0415   K 4.2 07/02/2016 1344   K 3.9 02/11/2012 0415   CL 104 07/02/2016 1344   CL 108 (H) 02/11/2012 0415   CO2 21 (L) 07/02/2016 1344   CO2 25 02/11/2012 0415   BUN 19 07/02/2016 1344   BUN 12 02/11/2012 0415   CREATININE 0.86 07/02/2016 1344   CREATININE 1.00 03/23/2014 1400      Component Value Date/Time   CALCIUM 9.0 07/02/2016 1344   CALCIUM 8.3 (L) 03/23/2014 1400   ALKPHOS 63 07/02/2016 1344   ALKPHOS 69 03/07/2014 1414   AST 24 07/02/2016 1344   AST 16 03/07/2014 1414   ALT 12 (L) 07/02/2016 1344   ALT 19 03/07/2014 1414   BILITOT 0.5 07/02/2016 1344   BILITOT 0.3 03/07/2014 1414     Results for MARUICE, PIERONI (MRN [id number]) as of 07/02/2016 14:15  Ref. Range 03/28/2015 10:10  06/25/2015 10:10 10/03/2015 10:22 01/03/2016 09:50 04/04/2016 09:56  PSA Latest Ref Range: 0.00 - 4.00 ng/mL 3.20 2.50 2.19 2.35 2.23       ASSESSMENT & PLAN:   Prostate cancer [facility]) # Prostate cancer recurrent ? Biochemical recurrence versus metastatic disease [no bone scans done]- on Lupron every 3 months [last NOV 2017- sec to osteoporosis].  PSA Feb 9th 2017- 2.35; awaiting PSA from today.   # Clinically no evidence of any obvious progression. Discussed re: Intermittent ADT; especially given his age/ osteoporosis. Will HOLD Lupron today.   # Osteoporosis- Reviewed the bone density from 2015 - severe osteoporosis. on Prolia q 6 M- started in feb 2017. Again due in 3 months.  # Mild Anemia Hb 10.9/chronic; will check iron studies/ ferritin today.   # follow up MD/Lupron/prolia  in 3 months. RN will relay results of PSA from today to pt.     Cammie Sickle, MD 07/02/2016 3:01 PM

## 2016-07-03 ENCOUNTER — Telehealth: Payer: Self-pay | Admitting: *Deleted

## 2016-07-03 NOTE — Telephone Encounter (Signed)
-----   Message from Cammie Sickle, MD sent at 07/03/2016 11:53 AM EDT ----- Please inform patient that PSA slightly up; at 3.98. But continued to hold off Lupron at this time. Follow-up labs as planned.

## 2016-07-03 NOTE — Telephone Encounter (Signed)
RN Left vm msg for patient- requesting a return phone call to discuss his lab results.

## 2016-07-04 ENCOUNTER — Encounter: Payer: Self-pay | Admitting: Internal Medicine

## 2016-07-07 ENCOUNTER — Telehealth: Payer: Self-pay | Admitting: *Deleted

## 2016-07-07 ENCOUNTER — Inpatient Hospital Stay: Payer: Medicare Other

## 2016-07-07 DIAGNOSIS — D649 Anemia, unspecified: Secondary | ICD-10-CM | POA: Diagnosis not present

## 2016-07-07 DIAGNOSIS — C61 Malignant neoplasm of prostate: Secondary | ICD-10-CM | POA: Diagnosis not present

## 2016-07-07 DIAGNOSIS — I872 Venous insufficiency (chronic) (peripheral): Secondary | ICD-10-CM | POA: Diagnosis not present

## 2016-07-07 DIAGNOSIS — M810 Age-related osteoporosis without current pathological fracture: Secondary | ICD-10-CM | POA: Diagnosis not present

## 2016-07-07 DIAGNOSIS — M129 Arthropathy, unspecified: Secondary | ICD-10-CM | POA: Diagnosis not present

## 2016-07-07 DIAGNOSIS — D638 Anemia in other chronic diseases classified elsewhere: Secondary | ICD-10-CM

## 2016-07-07 DIAGNOSIS — K5900 Constipation, unspecified: Secondary | ICD-10-CM | POA: Diagnosis not present

## 2016-07-07 LAB — URINALYSIS, COMPLETE (UACMP) WITH MICROSCOPIC
BACTERIA UA: NONE SEEN
Bilirubin Urine: NEGATIVE
Glucose, UA: NEGATIVE mg/dL
HGB URINE DIPSTICK: NEGATIVE
KETONES UR: 5 mg/dL — AB
LEUKOCYTES UA: NEGATIVE
Nitrite: NEGATIVE
PROTEIN: NEGATIVE mg/dL
SQUAMOUS EPITHELIAL / LPF: NONE SEEN
Specific Gravity, Urine: 1.018 (ref 1.005–1.030)
pH: 5 (ref 5.0–8.0)

## 2016-07-07 MED ORDER — FERROUS SULFATE 325 (65 FE) MG PO TBEC: 325.0000 mg | DELAYED_RELEASE_TABLET | Freq: Two times a day (BID) | ORAL | 3 refills | Status: DC

## 2016-07-07 NOTE — Telephone Encounter (Signed)
Needing prescription for his iron tabs. E scribed

## 2016-07-08 DIAGNOSIS — M129 Arthropathy, unspecified: Secondary | ICD-10-CM | POA: Diagnosis not present

## 2016-07-08 DIAGNOSIS — C61 Malignant neoplasm of prostate: Secondary | ICD-10-CM | POA: Diagnosis not present

## 2016-07-08 DIAGNOSIS — I872 Venous insufficiency (chronic) (peripheral): Secondary | ICD-10-CM | POA: Diagnosis not present

## 2016-07-08 DIAGNOSIS — K5900 Constipation, unspecified: Secondary | ICD-10-CM | POA: Diagnosis not present

## 2016-07-08 DIAGNOSIS — D649 Anemia, unspecified: Secondary | ICD-10-CM | POA: Diagnosis not present

## 2016-07-08 DIAGNOSIS — M810 Age-related osteoporosis without current pathological fracture: Secondary | ICD-10-CM | POA: Diagnosis not present

## 2016-07-12 DIAGNOSIS — C61 Malignant neoplasm of prostate: Secondary | ICD-10-CM | POA: Diagnosis not present

## 2016-07-12 DIAGNOSIS — M129 Arthropathy, unspecified: Secondary | ICD-10-CM | POA: Diagnosis not present

## 2016-07-12 DIAGNOSIS — K5900 Constipation, unspecified: Secondary | ICD-10-CM | POA: Diagnosis not present

## 2016-07-12 DIAGNOSIS — D649 Anemia, unspecified: Secondary | ICD-10-CM | POA: Diagnosis not present

## 2016-07-12 DIAGNOSIS — M810 Age-related osteoporosis without current pathological fracture: Secondary | ICD-10-CM | POA: Diagnosis not present

## 2016-07-12 DIAGNOSIS — I872 Venous insufficiency (chronic) (peripheral): Secondary | ICD-10-CM | POA: Diagnosis not present

## 2016-07-13 DIAGNOSIS — M810 Age-related osteoporosis without current pathological fracture: Secondary | ICD-10-CM | POA: Diagnosis not present

## 2016-07-13 DIAGNOSIS — D649 Anemia, unspecified: Secondary | ICD-10-CM | POA: Diagnosis not present

## 2016-07-13 DIAGNOSIS — C61 Malignant neoplasm of prostate: Secondary | ICD-10-CM | POA: Diagnosis not present

## 2016-07-13 DIAGNOSIS — I872 Venous insufficiency (chronic) (peripheral): Secondary | ICD-10-CM | POA: Diagnosis not present

## 2016-07-13 DIAGNOSIS — K5900 Constipation, unspecified: Secondary | ICD-10-CM | POA: Diagnosis not present

## 2016-07-13 DIAGNOSIS — M129 Arthropathy, unspecified: Secondary | ICD-10-CM | POA: Diagnosis not present

## 2016-07-15 ENCOUNTER — Other Ambulatory Visit: Payer: Self-pay

## 2016-07-15 DIAGNOSIS — D638 Anemia in other chronic diseases classified elsewhere: Secondary | ICD-10-CM

## 2016-07-15 LAB — OCCULT BLOOD X 1 CARD TO LAB, STOOL
FECAL OCCULT BLD: NEGATIVE
Fecal Occult Bld: NEGATIVE
Fecal Occult Bld: NEGATIVE

## 2016-07-29 ENCOUNTER — Other Ambulatory Visit: Payer: Self-pay | Admitting: Internal Medicine

## 2016-08-18 ENCOUNTER — Ambulatory Visit (INDEPENDENT_AMBULATORY_CARE_PROVIDER_SITE_OTHER): Payer: Medicare Other | Admitting: Podiatry

## 2016-08-18 ENCOUNTER — Encounter: Payer: Self-pay | Admitting: Podiatry

## 2016-08-18 DIAGNOSIS — B351 Tinea unguium: Secondary | ICD-10-CM

## 2016-08-18 DIAGNOSIS — M79609 Pain in unspecified limb: Secondary | ICD-10-CM

## 2016-08-18 NOTE — Progress Notes (Signed)
Complaint:  Visit Type: Patient returns to my office for continued preventative foot care services. Complaint: Patient states" my nails have grown long and thick and become painful to walk and wear shoes" Patient has been diagnosed with neuropathy.. The patient presents for preventative foot care services. No changes to ROS  Podiatric Exam: Vascular: dorsalis pedis and posterior tibial pulses are barely  palpable bilateral. Capillary return is immediate. Temperature gradient is WNL. Skin turgor WNL  Sensorium: Diminished  Semmes Weinstein monofilament test. Normal tactile sensation bilaterally. Nail Exam: Pt has thick disfigured discolored nails with subungual debris noted bilateral entire nail hallux through fifth toenails Ulcer Exam: There is no evidence of ulcer or pre-ulcerative changes or infection. Orthopedic Exam: Muscle tone and strength are WNL. No limitations in general ROM. No crepitus or effusions noted. Foot type and digits show no abnormalities.  Rearfoot  DJD noted  B/L Skin: No Porokeratosis. No infection or ulcers  Diagnosis:  Onychomycosis, , Pain in right toe, pain in left toes  Treatment & Plan Procedures and Treatment: Consent by patient was obtained for treatment procedures. The patient understood the discussion of treatment and procedures well. All questions were answered thoroughly reviewed. Debridement of mycotic and hypertrophic toenails, 1 through 5 bilateral and clearing of subungual debris. No ulceration, no infection noted.  Return Visit-Office Procedure: Patient instructed to return to the office for a follow up visit 3 months for continued evaluation and treatment.    Gardiner Barefoot DPM

## 2016-09-17 ENCOUNTER — Non-Acute Institutional Stay: Payer: Medicare Other | Admitting: Internal Medicine

## 2016-09-17 ENCOUNTER — Encounter: Payer: Self-pay | Admitting: Internal Medicine

## 2016-09-17 VITALS — BP 110/74 | HR 64 | Resp 18 | Wt 179.0 lb

## 2016-09-17 DIAGNOSIS — C61 Malignant neoplasm of prostate: Secondary | ICD-10-CM | POA: Diagnosis not present

## 2016-09-17 DIAGNOSIS — M159 Polyosteoarthritis, unspecified: Secondary | ICD-10-CM

## 2016-09-17 DIAGNOSIS — I872 Venous insufficiency (chronic) (peripheral): Secondary | ICD-10-CM | POA: Diagnosis not present

## 2016-09-17 DIAGNOSIS — G463 Brain stem stroke syndrome: Secondary | ICD-10-CM | POA: Diagnosis not present

## 2016-09-17 DIAGNOSIS — K5909 Other constipation: Secondary | ICD-10-CM | POA: Diagnosis not present

## 2016-09-17 DIAGNOSIS — M150 Primary generalized (osteo)arthritis: Secondary | ICD-10-CM

## 2016-09-17 DIAGNOSIS — M818 Other osteoporosis without current pathological fracture: Secondary | ICD-10-CM

## 2016-09-17 DIAGNOSIS — G629 Polyneuropathy, unspecified: Secondary | ICD-10-CM | POA: Diagnosis not present

## 2016-09-17 DIAGNOSIS — L84 Corns and callosities: Secondary | ICD-10-CM

## 2016-09-17 NOTE — Assessment & Plan Note (Signed)
Mainly c/o leg cramps Continue Flexeril and Tramadol

## 2016-09-17 NOTE — Progress Notes (Signed)
Subjective:    Patient ID: Benjamin Villarreal, male    DOB: 05/28/1920, 83 y.o.   MRN: [id number]  HPI  Routine visit, reviewed with Luellen Pucker, RN. No new concerns from staff. Resident c/o painful spot on bottom of right foot. Painful with ambulation. Sleeps well. Independent with ADL's. Appetite good. Weight stable. Walks with rolling walker.  Bowels okay.   Hx Prostate Ca: On Lupron, given by [facility] cancer center.  Peripheral Neuropathy: Often c/o leg cramps at night. Controlled with Flexeril and Tramadol.  Osteoporosis: Getting Reclast injections. No recent falls.  OA: Pain controlled with Tramadol.  Constipation: Controlled with Mirilax and Senna.  CVI: Wearing TED's daily.  History of Brain Stem Stroke: No residual effects.  Review of Systems      Past Medical History:  Diagnosis Date  . Arthritis   . Colon polyps    adenomatous  . History of fracture of tibia   . History of tachycardia   . Osteoporosis 01/2014   T -3.8 (01/2014)  . Other constipation    chronic  . Prostate cancer ([place])    surgery then lupron  . Unspecified venous (peripheral) insufficiency   . Vitamin B12 deficiency     Current Outpatient Prescriptions  Medication Sig Dispense Refill  . aspirin 81 MG EC tablet Take 81 mg by mouth every other day.     . Biotin 2.5 MG CAPS Take by mouth.    . Calcium Carbonate-Vitamin D (CALCIUM-D) 600-400 MG-UNIT TABS Take 1 tablet by mouth 2 (two) times daily.     . cholecalciferol (VITAMIN D) 1000 UNITS tablet Take 1,000 Units by mouth daily.      . Cholecalciferol (VITAMIN D-3) 1000 units CAPS Take by mouth.    . ferrous sulfate 325 (65 FE) MG EC tablet Take 1 tablet (325 mg total) by mouth 2 (two) times daily with a meal. 60 tablet 3  . fluticasone (FLONASE) 50 MCG/ACT nasal spray USE 2 SPRAYS IN EACH NOSTRIL DAILY 48 g 3  . ibuprofen (ADVIL,MOTRIN) 200 MG tablet Take 400 mg by mouth daily as needed.    . latanoprost (XALATAN) 0.005 % ophthalmic solution Place 1  drop into both eyes at bedtime.    Marland Kitchen leuprolide (LUPRON) 30 MG injection Inject 30 mg into the muscle every 4 (four) months. Dose and interval not clear yet    . loratadine (CLARITIN) 10 MG tablet TAKE 1 TABLET DAILY 90 tablet 3  . polyethylene glycol powder (GLYCOLAX/MIRALAX) powder FILL TO LINE (17 GRAMS ), MIX AND DRINK TWICE A DAY AND 8.5 GRAMS AT NOON 510 g 3  . sennosides-docusate sodium (SENOKOT-S) 8.6-50 MG tablet Take 2 tablets by mouth daily.     . Skin Protectants, Misc. (EUCERIN) cream Apply 1 application topically as needed for dry skin.    Marland Kitchen traMADol (ULTRAM) 50 MG tablet Take 1 tablet (50 mg total) by mouth 3 (three) times daily as needed. 270 tablet 0  . triamcinolone cream (KENALOG) 0.1 % Apply 1 application topically every other day. Alternating with Ciclopirox    . vitamin B-12 (CYANOCOBALAMIN) 1000 MCG tablet Take 1 tablet (1,000 mcg total) by mouth daily. 90 tablet 3  . vitamin C (ASCORBIC ACID) 500 MG tablet Take 500 mg by mouth daily.    . zoledronic acid (RECLAST) 5 MG/100ML SOLN injection Inject 5 mg into the vein once. ?yearly    . clindamycin (CLEOCIN) 150 MG capsule Take 150 mg by mouth. Take 4 capsules prior to dental work    .  cyclobenzaprine (FLEXERIL) 5 MG tablet Take 5 mg by mouth as needed.     No current facility-administered medications for this visit.    Facility-Administered Medications Ordered in Other Visits  Medication Dose Route Frequency Provider Last Rate Last Dose  . leuprolide (LUPRON) injection 30 mg  30 mg Intramuscular Once Leia Alf, MD        Allergies  Allergen Reactions  . Other     RAW ONIONS- SINUS REACTION    Family History  Problem Relation Age of Onset  . Diabetes Son   . Hypertension Paternal Grandfather     Social History   Social History  . Marital status: Widowed    Spouse name: [name]  . Number of children: 3  . Years of education: N/A   Occupational History  . [employer]     retired   Social  History Main Topics  . Smoking status: Never Smoker  . Smokeless tobacco: Never Used  . Alcohol use 0.0 - 0.6 oz/week     Comment: in past  . Drug use: Unknown  . Sexual activity: Not on file   Other Topics Concern  . Not on file   Social History Narrative   Has living will   Daughter [name] holds health care POA.   Has DNR and we have reviewed this.   Would not want tube feedings if cognitively unaware     Constitutional: Denies fever, malaise, fatigue, headache or abrupt weight changes.  HEENT: Denies eye pain, eye redness, ear pain, ringing in the ears, wax buildup, runny nose, nasal congestion, bloody nose, or sore throat. Respiratory: Denies difficulty breathing, shortness of breath, cough or sputum production.   Cardiovascular: Denies chest pain, chest tightness, palpitations or swelling in the hands or feet.  Gastrointestinal: Pt reports constipation. Denies abdominal pain, bloating, diarrhea or blood in the stool.  GU: Denies urgency, frequency, pain with urination, burning sensation, blood in urine, odor or discharge. Musculoskeletal: Pt reports intermittent leg cramps. Denies decrease in range of motion, difficulty with gait, or joint pain and swelling.  Skin: Pt reports lesion on bottom of foot. Denies redness, rashes, or ulcercations.  Neurological: Denies dizziness, difficulty with memory, difficulty with speech or problems with balance and coordination.  Psych: Denies anxiety, depression, SI/HI.  No other specific complaints in a complete review of systems (except as listed in HPI above).  Objective:   Physical Exam   BP 110/74   Pulse 64   Resp 18   Wt 179 lb (81.2 kg)   BMI 22.67 kg/m  Wt Readings from Last 3 Encounters:  09/17/16 179 lb (81.2 kg)  07/02/16 179 lb 4 oz (81.3 kg)  05/22/16 181 lb (82.1 kg)    General: Appears his stated age, in NAD. Skin: Callous noted on ball of right foot. Neck:  Neck supple, trachea midline. No masses, lumps  present.  Cardiovascular: Normal rate and rhythm. S1,S2 noted.  No murmur, rubs or gallops noted. No JVD or BLE edema.  Pulmonary/Chest: Normal effort and positive vesicular breath sounds. No respiratory distress. No wheezes, rales or ronchi noted.  Abdomen: Soft and nontender. Normal bowel sounds. No distention or masses noted.  Musculoskeletal: Gait slow but steady with use of rolling walker. Neurological: Alert and oriented.   BMET    Component Value Date/Time   NA 133 (L) 07/02/2016 1344   NA 140 02/11/2012 0415   K 4.2 07/02/2016 1344   K 3.9 02/11/2012 0415   CL 104 07/02/2016  1344   CL 108 (H) 02/11/2012 0415   CO2 21 (L) 07/02/2016 1344   CO2 25 02/11/2012 0415   GLUCOSE 151 (H) 07/02/2016 1344   GLUCOSE 125 (H) 02/11/2012 0415   BUN 19 07/02/2016 1344   BUN 12 02/11/2012 0415   CREATININE 0.86 07/02/2016 1344   CREATININE 1.00 03/23/2014 1400   CALCIUM 9.0 07/02/2016 1344   CALCIUM 8.3 (L) 03/23/2014 1400   GFRNONAA >60 07/02/2016 1344   GFRNONAA >60 03/23/2014 1400   GFRNONAA 49 (L) 09/02/2013 1428   GFRAA >60 07/02/2016 1344   GFRAA >60 03/23/2014 1400   GFRAA 57 (L) 09/02/2013 1428    Lipid Panel  No results found for: CHOL, TRIG, HDL, CHOLHDL, VLDL, LDLCALC  CBC    Component Value Date/Time   WBC 6.0 07/02/2016 1344   RBC 4.00 (L) 07/02/2016 1344   HGB 10.9 (L) 07/02/2016 1344   HGB 11.3 (L) 03/07/2014 1414   HCT 32.7 (L) 07/02/2016 1344   HCT 34.2 (L) 03/07/2014 1414   PLT 177 07/02/2016 1344   PLT 143 (L) 03/07/2014 1414   MCV 81.7 07/02/2016 1344   MCV 84 03/07/2014 1414   MCH 27.2 07/02/2016 1344   MCHC 33.3 07/02/2016 1344   RDW 15.3 (H) 07/02/2016 1344   RDW 15.2 (H) 03/07/2014 1414   LYMPHSABS 1.6 07/02/2016 1344   LYMPHSABS 1.8 03/07/2014 1414   MONOABS 0.8 07/02/2016 1344   MONOABS 0.5 03/07/2014 1414   EOSABS 0.6 07/02/2016 1344   EOSABS 0.3 03/07/2014 1414   BASOSABS 0.1 07/02/2016 1344   BASOSABS 0.2 (H) 03/07/2014 1414    Hgb  A1C No results found for: HGBA1C         Assessment & Plan:   Callus of Foot:  Will trim with dremmel tool later this week  Will reasses as needed [name], NP

## 2016-09-17 NOTE — Assessment & Plan Note (Signed)
No edema today Continue TED's

## 2016-09-17 NOTE — Assessment & Plan Note (Signed)
Continue Mirilax and Senna 

## 2016-09-17 NOTE — Assessment & Plan Note (Signed)
No serious residual effect, intermittent dizziness Monitor

## 2016-09-17 NOTE — Assessment & Plan Note (Signed)
Continue Lupron injections

## 2016-09-17 NOTE — Assessment & Plan Note (Signed)
Continue Tramadol for pain.  

## 2016-09-17 NOTE — Assessment & Plan Note (Signed)
Continue Reclast injections

## 2016-09-17 NOTE — Patient Instructions (Signed)
Corns and Calluses Corns are small areas of thickened skin that occur on the top, sides, or tip of a toe. They contain a cone-shaped core with a point that can press on a nerve below. This causes pain. Calluses are areas of thickened skin that can occur anywhere on the body including hands, fingers, palms, soles of the feet, and heels.Calluses are usually larger than corns. What are the causes? Corns and calluses are caused by rubbing (friction) or pressure, such as from shoes that are too tight or do not fit properly. What increases the risk? Corns are more likely to develop in people who have toe deformities, such as hammer toes. Since calluses can occur with friction to any area of the skin, calluses are more likely to develop in people who:  Work with their hands.  Wear shoes that fit poorly, shoes that are too tight, or shoes that are high-heeled.  Have toes deformities.  What are the signs or symptoms? Symptoms of a corn or callus include:  A hard growth on the skin.  Pain or tenderness under the skin.  Redness and swelling.  Increased discomfort while wearing tight-fitting shoes.  How is this diagnosed? Corns and calluses may be diagnosed with a medical history and physical exam. How is this treated? Corns and calluses may be treated with:  Removing the cause of the friction or pressure. This may include: ? Changing your shoes. ? Wearing shoe inserts (orthotics) or other protective layers in your shoes, such as a corn pad. ? Wearing gloves.  Medicines to help soften skin in the hardened, thickened areas.  Reducing the size of the corn or callus by removing the dead layers of skin.  Antibiotic medicines to treat infection.  Surgery, if a toe deformity is the cause.  Follow these instructions at home:  Take medicines only as directed by your health care provider.  If you were prescribed an antibiotic, finish all of it even if you start to feel better.  Wear  shoes that fit well. Avoid wearing high-heeled shoes and shoes that are too tight or too loose.  Wear any padding, protective layers, gloves, or orthotics as directed by your health care provider.  Soak your hands or feet and then use a file or pumice stone to soften your corn or callus. Do this as directed by your health care provider.  Check your corn or callus every day for signs of infection. Watch for: ? Redness, swelling, or pain. ? Fluid, blood, or pus. Contact a health care provider if:  Your symptoms do not improve with treatment.  You have increased redness, swelling, or pain at the site of your corn or callus.  You have fluid, blood, or pus coming from your corn or callus.  You have new symptoms. This information is not intended to replace advice given to you by your health care provider. Make sure you discuss any questions you have with your health care provider. Document Released: 11/17/2003 Document Revised: 08/31/2015 Document Reviewed: 02/06/2014 Elsevier Interactive Patient Education  2018 Elsevier Inc.  

## 2016-09-30 ENCOUNTER — Other Ambulatory Visit: Payer: Self-pay | Admitting: Internal Medicine

## 2016-09-30 NOTE — Telephone Encounter (Signed)
Pt called to ck status of polyethylene glycol powder refill; advised [facility] on 07/[facility]. Pt said nothing further needed.

## 2016-10-03 ENCOUNTER — Inpatient Hospital Stay (HOSPITAL_BASED_OUTPATIENT_CLINIC_OR_DEPARTMENT_OTHER): Payer: Medicare Other | Admitting: Internal Medicine

## 2016-10-03 ENCOUNTER — Ambulatory Visit: Payer: Medicare Other | Admitting: Internal Medicine

## 2016-10-03 ENCOUNTER — Inpatient Hospital Stay: Payer: Medicare Other

## 2016-10-03 ENCOUNTER — Inpatient Hospital Stay: Payer: Medicare Other | Attending: Internal Medicine

## 2016-10-03 VITALS — BP 106/71 | HR 69 | Temp 97.4°F | Resp 20 | Ht 74.5 in | Wt 179.4 lb

## 2016-10-03 DIAGNOSIS — R9721 Rising PSA following treatment for malignant neoplasm of prostate: Secondary | ICD-10-CM | POA: Diagnosis not present

## 2016-10-03 DIAGNOSIS — R109 Unspecified abdominal pain: Secondary | ICD-10-CM | POA: Diagnosis not present

## 2016-10-03 DIAGNOSIS — R1084 Generalized abdominal pain: Secondary | ICD-10-CM

## 2016-10-03 DIAGNOSIS — R232 Flushing: Secondary | ICD-10-CM

## 2016-10-03 DIAGNOSIS — D649 Anemia, unspecified: Secondary | ICD-10-CM | POA: Insufficient documentation

## 2016-10-03 DIAGNOSIS — Z7982 Long term (current) use of aspirin: Secondary | ICD-10-CM

## 2016-10-03 DIAGNOSIS — Z79899 Other long term (current) drug therapy: Secondary | ICD-10-CM | POA: Diagnosis not present

## 2016-10-03 DIAGNOSIS — C61 Malignant neoplasm of prostate: Secondary | ICD-10-CM | POA: Diagnosis not present

## 2016-10-03 DIAGNOSIS — Z86010 Personal history of colonic polyps: Secondary | ICD-10-CM

## 2016-10-03 DIAGNOSIS — D500 Iron deficiency anemia secondary to blood loss (chronic): Secondary | ICD-10-CM

## 2016-10-03 DIAGNOSIS — M810 Age-related osteoporosis without current pathological fracture: Secondary | ICD-10-CM

## 2016-10-03 DIAGNOSIS — R000 Tachycardia, unspecified: Secondary | ICD-10-CM | POA: Diagnosis not present

## 2016-10-03 DIAGNOSIS — Z85820 Personal history of malignant melanoma of skin: Secondary | ICD-10-CM | POA: Diagnosis not present

## 2016-10-03 DIAGNOSIS — K5900 Constipation, unspecified: Secondary | ICD-10-CM | POA: Insufficient documentation

## 2016-10-03 DIAGNOSIS — N1330 Unspecified hydronephrosis: Secondary | ICD-10-CM | POA: Insufficient documentation

## 2016-10-03 DIAGNOSIS — Z79818 Long term (current) use of other agents affecting estrogen receptors and estrogen levels: Secondary | ICD-10-CM | POA: Insufficient documentation

## 2016-10-03 DIAGNOSIS — E538 Deficiency of other specified B group vitamins: Secondary | ICD-10-CM | POA: Insufficient documentation

## 2016-10-03 DIAGNOSIS — N329 Bladder disorder, unspecified: Secondary | ICD-10-CM | POA: Insufficient documentation

## 2016-10-03 LAB — CBC WITH DIFFERENTIAL/PLATELET
Basophils Absolute: 0.1 10*3/uL (ref 0–0.1)
Basophils Relative: 1 %
Eosinophils Absolute: 0.3 10*3/uL (ref 0–0.7)
Eosinophils Relative: 5 %
HEMATOCRIT: 33.8 % — AB (ref 40.0–52.0)
HEMOGLOBIN: 11.7 g/dL — AB (ref 13.0–18.0)
LYMPHS ABS: 2.2 10*3/uL (ref 1.0–3.6)
LYMPHS PCT: 33 %
MCH: 29.8 pg (ref 26.0–34.0)
MCHC: 34.6 g/dL (ref 32.0–36.0)
MCV: 86.1 fL (ref 80.0–100.0)
MONOS PCT: 8 %
Monocytes Absolute: 0.5 10*3/uL (ref 0.2–1.0)
NEUTROS PCT: 53 %
Neutro Abs: 3.4 10*3/uL (ref 1.4–6.5)
Platelets: 137 10*3/uL — ABNORMAL LOW (ref 150–440)
RBC: 3.93 MIL/uL — ABNORMAL LOW (ref 4.40–5.90)
RDW: 16.2 % — ABNORMAL HIGH (ref 11.5–14.5)
WBC: 6.5 10*3/uL (ref 3.8–10.6)

## 2016-10-03 LAB — URINALYSIS, COMPLETE (UACMP) WITH MICROSCOPIC
BILIRUBIN URINE: NEGATIVE
Bacteria, UA: NONE SEEN
GLUCOSE, UA: NEGATIVE mg/dL
HGB URINE DIPSTICK: NEGATIVE
KETONES UR: NEGATIVE mg/dL
LEUKOCYTES UA: NEGATIVE
NITRITE: NEGATIVE
PH: 5 (ref 5.0–8.0)
PROTEIN: NEGATIVE mg/dL
Specific Gravity, Urine: 1.008 (ref 1.005–1.030)

## 2016-10-03 LAB — BASIC METABOLIC PANEL
Anion gap: 10 (ref 5–15)
BUN: 21 mg/dL — ABNORMAL HIGH (ref 6–20)
CHLORIDE: 105 mmol/L (ref 101–111)
CO2: 21 mmol/L — ABNORMAL LOW (ref 22–32)
CREATININE: 1.06 mg/dL (ref 0.61–1.24)
Calcium: 8.8 mg/dL — ABNORMAL LOW (ref 8.9–10.3)
GFR calc Af Amer: >60 mL/min (ref >60)
GFR calc non Af Amer: 58 mL/min — ABNORMAL LOW (ref >60)
Glucose, Bld: 189 mg/dL — ABNORMAL HIGH (ref 65–99)
Potassium: 4.1 mmol/L (ref 3.5–5.1)
SODIUM: 136 mmol/L (ref 135–145)

## 2016-10-03 MED ORDER — DENOSUMAB 60 MG/ML ~~LOC~~ SOLN
60.0000 mg | Freq: Once | SUBCUTANEOUS | Status: AC
  Administered 2016-10-03: 60 mg via SUBCUTANEOUS
  Filled 2016-10-03: qty 1

## 2016-10-03 NOTE — Progress Notes (Signed)
[place] OFFICE PROGRESS NOTE  Patient Care Team: Venia Carbon, MD as PCP - General   SUMMARY OF ONCOLOGIC HISTORY: Oncology History    # 1996 PROSTATE CANCER [Gleason score 2+3] s/p Radical Prostatectomy [[facility]]; ?Biochem RECURRENT PROSTATE CANCER-Hormone sensitive;  Intermittent Lupron; on Lupron q 22M [Jan 2016 PSA- 7.9].  HOLD LUPRON in MAY 2017; AUg 2017- Re-start  # Osteoporosis [[facility]- 2015]- on Reclast q 61M; FEB 2017- Start Prolia q 6 M     Prostate cancer ([place])   03/22/2009 Initial Diagnosis    Prostate cancer ([place])       INTERVAL HISTORY:  A very pleasant 83 year old male patient with above history of prostate cancer recurrent on Intermittent Lupron every 3 months [Intermittent given hot flashes severe osteoporosis]/ Prolia q 61M is here for follow-up.  He continues to live in the assisted living; is fairly active for his age.   Patient admits to intermittent abdominal pain. Generalized. No nausea no vomiting. Notes to have constipation also intermittent blood in stools. Patient Continues to deny any recent hospitalizations. Appetite is good. No weight loss.   No hot flashes. He feels fairly well for his age. Denies any difficulty urination. No jaw pain.  REVIEW OF SYSTEMS:  A complete 10 point review of system is done which is negative except mentioned above/history of present illness.   PAST MEDICAL HISTORY :  Past Medical History:  Diagnosis Date  . Arthritis   . Colon polyps    adenomatous  . History of fracture of tibia   . History of tachycardia   . Osteoporosis 01/2014   T -3.8 (01/2014)  . Other constipation    chronic  . Prostate cancer ([place])    surgery then lupron  . Unspecified venous (peripheral) insufficiency   . Vitamin B12 deficiency     PAST SURGICAL HISTORY :   Past Surgical History:  Procedure Laterality Date  . BASAL CELL CARCINOMA EXCISION  2004   Left side of face  . CATARACT EXTRACTION  2000, 2003   both eyes  .  COLONOSCOPY     h/o adenomatous polyps  . JOINT REPLACEMENT  2005   Left knee  . JOINT REPLACEMENT  12/13   Right knee  . PROSTATE SURGERY  1996   cancer  . sigmoid resection    . TIBIA FRACTURE SURGERY  1999  . VOLVULUS REDUCTION  12/10    FAMILY HISTORY :   Family History  Problem Relation Age of Onset  . Diabetes Son   . Hypertension Paternal Grandfather     SOCIAL HISTORY:   Social History  Substance Use Topics  . Smoking status: Never Smoker  . Smokeless tobacco: Never Used  . Alcohol use 0.0 - 0.6 oz/week     Comment: in past    ALLERGIES:  is allergic to other.  MEDICATIONS:  Current Outpatient Prescriptions  Medication Sig Dispense Refill  . aspirin 81 MG EC tablet Take 81 mg by mouth every other day.     . Biotin 2.5 MG CAPS Take by mouth.    . bismuth subsalicylate (PEPTO BISMOL) 262 MG/15ML suspension Take 10 mLs by mouth 4 (four) times daily -  before meals and at bedtime.    . Calcium Carbonate-Vitamin D (CALCIUM-D) 600-400 MG-UNIT TABS Take 1 tablet by mouth 2 (two) times daily.     . cholecalciferol (VITAMIN D) 1000 UNITS tablet Take 1,000 Units by mouth daily.      . clindamycin (CLEOCIN) 150  MG capsule Take 150 mg by mouth. Take 4 capsules prior to dental work    . diphenhydrAMINE (BENADRYL) 25 mg capsule Take 25 mg by mouth every 4 (four) hours as needed for itching or allergies.    . fluticasone (FLONASE) 50 MCG/ACT nasal spray USE 2 SPRAYS IN EACH NOSTRIL DAILY 48 g 3  . ibuprofen (ADVIL,MOTRIN) 200 MG tablet Take 400 mg by mouth daily as needed.    . latanoprost (XALATAN) 0.005 % ophthalmic solution Place 1 drop into both eyes at bedtime.    Marland Kitchen leuprolide (LUPRON) 30 MG injection Inject 30 mg into the muscle every 4 (four) months. Dose and interval not clear yet    . loratadine (CLARITIN) 10 MG tablet TAKE 1 TABLET DAILY 90 tablet 3  . sennosides-docusate sodium (SENOKOT-S) 8.6-50 MG tablet Take 2 tablets by mouth daily.     . Skin Protectants, Misc.  (EUCERIN) cream Apply 1 application topically as needed for dry skin.    Marland Kitchen traMADol (ULTRAM) 50 MG tablet Take 1 tablet (50 mg total) by mouth 3 (three) times daily as needed. 270 tablet 0  . triamcinolone cream (KENALOG) 0.1 % Apply 1 application topically every other day. Alternating with Ciclopirox    . vitamin B-12 (CYANOCOBALAMIN) 1000 MCG tablet Take 1 tablet (1,000 mcg total) by mouth daily. 90 tablet 3  . vitamin C (ASCORBIC ACID) 500 MG tablet Take 500 mg by mouth daily.    . polyethylene glycol powder (GLYCOLAX/MIRALAX) powder FILL TO LINE (17 GRAMS ), MIX AND DRINK TWICE A DAY AND 8.5 GRAMS AT NOON (Patient not taking: Reported on 10/03/2016) 510 g 3  . zoledronic acid (RECLAST) 5 MG/100ML SOLN injection Inject 5 mg into the vein once. ?yearly     No current facility-administered medications for this visit.    Facility-Administered Medications Ordered in Other Visits  Medication Dose Route Frequency Provider Last Rate Last Dose  . leuprolide (LUPRON) injection 30 mg  30 mg Intramuscular Once Leia Alf, MD        PHYSICAL EXAMINATION: ECOG PERFORMANCE STATUS: 1 - Symptomatic but completely ambulatory  BP 106/71 (Patient Position: Sitting)   Pulse 69   Temp (!) 97.4 F (36.3 C) (Tympanic)   Resp 20   Ht 6' 2.5" (1.892 m)   Wt 179 lb 6.4 oz (81.4 kg)   SpO2 (!) 87%   BMI 22.73 kg/m   Filed Weights   10/03/16 1413  Weight: 179 lb 6.4 oz (81.4 kg)    GENERAL: Well-nourished well-developed; Alert, no distress and comfortable.  Alone; walks with a rolling walker EYES: no pallor or icterus OROPHARYNX: no thrush or ulceration; good dentition  NECK: supple, no masses felt LYMPH:  no palpable lymphadenopathy in the cervical, axillary or inguinal regions LUNGS: clear to auscultation and  No wheeze or crackles HEART/CVS: regular rate & rhythm and no murmurs; No lower extremity edema ABDOMEN:abdomen soft, non-tender and normal bowel sounds Musculoskeletal:no cyanosis of  digits and no clubbing  PSYCH: alert & oriented x 3 with fluent speech NEURO: no focal motor/sensory deficits SKIN:  no rashes or significant lesions  LABORATORY DATA:  I have reviewed the data as listed    Component Value Date/Time   NA 136 10/03/2016 1341   NA 140 02/11/2012 0415   K 4.1 10/03/2016 1341   K 3.9 02/11/2012 0415   CL 105 10/03/2016 1341   CL 108 (H) 02/11/2012 0415   CO2 21 (L) 10/03/2016 1341   CO2 25 02/11/2012 0415  GLUCOSE 189 (H) 10/03/2016 1341   GLUCOSE 125 (H) 02/11/2012 0415   BUN 21 (H) 10/03/2016 1341   BUN 12 02/11/2012 0415   CREATININE 1.06 10/03/2016 1341   CREATININE 1.00 03/23/2014 1400   CALCIUM 8.8 (L) 10/03/2016 1341   CALCIUM 8.3 (L) 03/23/2014 1400   PROT 7.7 07/02/2016 1344   PROT 7.0 03/07/2014 1414   ALBUMIN 3.9 07/02/2016 1344   ALBUMIN 3.7 03/07/2014 1414   AST 24 07/02/2016 1344   AST 16 03/07/2014 1414   ALT 12 (L) 07/02/2016 1344   ALT 19 03/07/2014 1414   ALKPHOS 63 07/02/2016 1344   ALKPHOS 69 03/07/2014 1414   BILITOT 0.5 07/02/2016 1344   BILITOT 0.3 03/07/2014 1414   GFRNONAA 58 (L) 10/03/2016 1341   GFRNONAA >60 03/23/2014 1400   GFRNONAA 49 (L) 09/02/2013 1428   GFRAA >60 10/03/2016 1341   GFRAA >60 03/23/2014 1400   GFRAA 57 (L) 09/02/2013 1428    No results found for: SPEP, UPEP  Lab Results  Component Value Date   WBC 6.5 10/03/2016   NEUTROABS 3.4 10/03/2016   HGB 11.7 (L) 10/03/2016   HCT 33.8 (L) 10/03/2016   MCV 86.1 10/03/2016   PLT 137 (L) 10/03/2016      Chemistry      Component Value Date/Time   NA 136 10/03/2016 1341   NA 140 02/11/2012 0415   K 4.1 10/03/2016 1341   K 3.9 02/11/2012 0415   CL 105 10/03/2016 1341   CL 108 (H) 02/11/2012 0415   CO2 21 (L) 10/03/2016 1341   CO2 25 02/11/2012 0415   BUN 21 (H) 10/03/2016 1341   BUN 12 02/11/2012 0415   CREATININE 1.06 10/03/2016 1341   CREATININE 1.00 03/23/2014 1400   GLU 114 06/30/2016      Component Value Date/Time   CALCIUM  8.8 (L) 10/03/2016 1341   CALCIUM 8.3 (L) 03/23/2014 1400   ALKPHOS 63 07/02/2016 1344   ALKPHOS 69 03/07/2014 1414   AST 24 07/02/2016 1344   AST 16 03/07/2014 1414   ALT 12 (L) 07/02/2016 1344   ALT 19 03/07/2014 1414   BILITOT 0.5 07/02/2016 1344   BILITOT 0.3 03/07/2014 1414     Results for NICHOLUS, CHANDRAN (MRN [id number]) as of 10/03/2016 14:50  Ref. Range 06/25/2015 10:10 10/03/2015 10:22 01/03/2016 09:50 04/04/2016 09:56 07/02/2016 13:44  PSA Latest Ref Range: 0.00 - 4.00 ng/mL 2.50 2.19 2.35 2.23 3.98        ASSESSMENT & PLAN:   Prostate cancer [facility]) # Prostate cancer recurrent ? Biochemical recurrence versus metastatic disease [no bone scans done]- on Lupron every 3 months [last NOV 2017- sec to osteoporosis].  PSA Feb 9th 2017- 2.35; awaiting PSA from today.   # Clinically no evidence of any obvious progression. Discussed re: Intermittent ADT; especially given his age/ osteoporosis. Will HOLD Lupron today.   # Osteoporosis- Reviewed the bone density from 2015 - severe osteoporosis. on Prolia q 6 M- started in feb 2017. Again due today.   # Mild Anemia Hb 11/chronic; May 2018- Iron def ; ? Etiology ? Blood in stools/ recent abodomen pain- currently resolved. Proceed with CT scan of the abdomen and pelvis with contrast. Check QA; stool occult blood.  # follow up MD- few days after CT scan; possible IV iron.      Cammie Sickle, MD 10/05/2016 7:24 PM

## 2016-10-03 NOTE — Assessment & Plan Note (Addendum)
#   Prostate cancer recurrent ? Biochemical recurrence versus metastatic disease [no bone scans done]- on Lupron every 3 months [last NOV 2017- sec to osteoporosis].  PSA Feb 9th 2017- 2.35; awaiting PSA from today.   # Clinically no evidence of any obvious progression. Discussed re: Intermittent ADT; especially given his age/ osteoporosis. Will HOLD Lupron today.   # Osteoporosis- Reviewed the bone density from 2015 - severe osteoporosis. on Prolia q 6 M- started in feb 2017. Again due today.   # Mild Anemia Hb 11/chronic; May 2018- Iron def ; ? Etiology ? Blood in stools/ recent abodomen pain- currently resolved. Proceed with CT scan of the abdomen and pelvis with contrast. Check QA; stool occult blood.  # follow up MD- few days after CT scan; possible IV iron.

## 2016-10-03 NOTE — Progress Notes (Signed)
Received call from [facility], Dr. Rogue Bussing team, that patient is to get Prolia injection only today.  No Lupron.

## 2016-10-04 LAB — PSA: PROSTATIC SPECIFIC ANTIGEN: 5.37 ng/mL — AB (ref 0.00–4.00)

## 2016-10-04 LAB — TESTOSTERONE: Testosterone: <3 ng/dL — ABNORMAL LOW (ref 264–916)

## 2016-10-05 ENCOUNTER — Telehealth: Payer: Self-pay | Admitting: Internal Medicine

## 2016-10-05 DIAGNOSIS — D500 Iron deficiency anemia secondary to blood loss (chronic): Secondary | ICD-10-CM | POA: Insufficient documentation

## 2016-10-05 NOTE — Telephone Encounter (Signed)
Also please set the patient up for a Lupron- when he comes for a follow-up on 8:15. Thx

## 2016-10-06 ENCOUNTER — Ambulatory Visit
Admission: RE | Admit: 2016-10-06 | Discharge: 2016-10-06 | Disposition: A | Discharge status: Home / Self Care | Payer: Medicare Other | Source: Ambulatory Visit | Attending: Internal Medicine | Admitting: Internal Medicine

## 2016-10-06 ENCOUNTER — Other Ambulatory Visit: Payer: Self-pay | Admitting: Internal Medicine

## 2016-10-06 DIAGNOSIS — R1084 Generalized abdominal pain: Secondary | ICD-10-CM | POA: Diagnosis not present

## 2016-10-06 DIAGNOSIS — I700 Atherosclerosis of aorta: Secondary | ICD-10-CM | POA: Diagnosis not present

## 2016-10-06 DIAGNOSIS — N1330 Unspecified hydronephrosis: Secondary | ICD-10-CM

## 2016-10-06 DIAGNOSIS — K760 Fatty (change of) liver, not elsewhere classified: Secondary | ICD-10-CM | POA: Insufficient documentation

## 2016-10-06 DIAGNOSIS — N289 Disorder of kidney and ureter, unspecified: Secondary | ICD-10-CM | POA: Diagnosis not present

## 2016-10-06 DIAGNOSIS — C61 Malignant neoplasm of prostate: Secondary | ICD-10-CM | POA: Diagnosis not present

## 2016-10-06 MED ORDER — IOPAMIDOL (ISOVUE-300) INJECTION 61%
100.0000 mL | Freq: Once | INTRAVENOUS | Status: AC | PRN
  Administered 2016-10-06: 100 mL via INTRAVENOUS

## 2016-10-06 NOTE — Telephone Encounter (Signed)
msg sent to sch. Team to add pt to sch. For lupron inj on 10/08/16

## 2016-10-06 NOTE — Progress Notes (Signed)
FYI- will discuss imaging at tumor conference

## 2016-10-08 ENCOUNTER — Inpatient Hospital Stay (HOSPITAL_BASED_OUTPATIENT_CLINIC_OR_DEPARTMENT_OTHER): Payer: Medicare Other | Admitting: Internal Medicine

## 2016-10-08 ENCOUNTER — Inpatient Hospital Stay: Payer: Medicare Other

## 2016-10-08 VITALS — BP 122/75 | HR 74 | Temp 97.2°F | Resp 20 | Ht 74.5 in | Wt 180.0 lb

## 2016-10-08 DIAGNOSIS — R9721 Rising PSA following treatment for malignant neoplasm of prostate: Secondary | ICD-10-CM | POA: Diagnosis not present

## 2016-10-08 DIAGNOSIS — N1330 Unspecified hydronephrosis: Secondary | ICD-10-CM | POA: Diagnosis not present

## 2016-10-08 DIAGNOSIS — Z79818 Long term (current) use of other agents affecting estrogen receptors and estrogen levels: Secondary | ICD-10-CM | POA: Diagnosis not present

## 2016-10-08 DIAGNOSIS — N329 Bladder disorder, unspecified: Secondary | ICD-10-CM | POA: Diagnosis not present

## 2016-10-08 DIAGNOSIS — C61 Malignant neoplasm of prostate: Secondary | ICD-10-CM

## 2016-10-08 DIAGNOSIS — Z79899 Other long term (current) drug therapy: Secondary | ICD-10-CM

## 2016-10-08 DIAGNOSIS — R109 Unspecified abdominal pain: Secondary | ICD-10-CM

## 2016-10-08 DIAGNOSIS — M810 Age-related osteoporosis without current pathological fracture: Secondary | ICD-10-CM

## 2016-10-08 DIAGNOSIS — Z86010 Personal history of colonic polyps: Secondary | ICD-10-CM

## 2016-10-08 DIAGNOSIS — R232 Flushing: Secondary | ICD-10-CM | POA: Diagnosis not present

## 2016-10-08 DIAGNOSIS — Z85820 Personal history of malignant melanoma of skin: Secondary | ICD-10-CM

## 2016-10-08 DIAGNOSIS — K5900 Constipation, unspecified: Secondary | ICD-10-CM

## 2016-10-08 DIAGNOSIS — R000 Tachycardia, unspecified: Secondary | ICD-10-CM

## 2016-10-08 DIAGNOSIS — D649 Anemia, unspecified: Secondary | ICD-10-CM

## 2016-10-08 DIAGNOSIS — Z7982 Long term (current) use of aspirin: Secondary | ICD-10-CM

## 2016-10-08 DIAGNOSIS — E538 Deficiency of other specified B group vitamins: Secondary | ICD-10-CM

## 2016-10-08 MED ORDER — LEUPROLIDE ACETATE (3 MONTH) 22.5 MG IM KIT
22.5000 mg | PACK | Freq: Once | INTRAMUSCULAR | Status: AC
  Administered 2016-10-08: 22.5 mg via INTRAMUSCULAR
  Filled 2016-10-08: qty 22.5

## 2016-10-08 NOTE — Assessment & Plan Note (Addendum)
# #  CASTRATE RESISTANT PROSTATE CANCER- Biochemical recurrence versus metastatic disease [will need bone scan; will discuss at next visit- on Lupron every 3 months [last NOV 2017- sec to osteoporosis]. However, PSA aug 10th 2018- 5.37.   # Given worsening- PSA at 5.37 [testosterone] recommend re-starting Lupron today.   # Left hydronephrosis/? Bladder mass at [facility]- recommend referral to Dr.Brandon; Urology. Also review the scans at tumor conference. Creat 1.0/at baseline.   # Osteoporosis- Reviewed the bone density from 2015 - severe osteoporosis. on Prolia q 6 M- started in feb 2017.   # Mild Anemia Hb 11/chronic; May 2018- Iron def ; [facility]A-NEG for blood.   # follow up with me in 3 week/labs-PSA/possible venofer.   # I reviewed the blood work- with the patient in detail; also reviewed the imaging independently [as summarized above]; and with the patient in detail.

## 2016-10-08 NOTE — Progress Notes (Signed)
[place] [place] OFFICE PROGRESS NOTE  Patient Care Team: Venia Carbon, MD as PCP - General   SUMMARY OF ONCOLOGIC HISTORY: Oncology History    # 1996 PROSTATE CANCER [Gleason score 2+3] s/p Radical Prostatectomy [[facility]]; ?Biochem RECURRENT PROSTATE CANCER-Hormone sensitive;  Intermittent Lupron; on Lupron q 57M [Jan 2016 PSA- 7.9].  HOLD LUPRON in MAY 2017; AUg 2017- Re-start  # Osteoporosis [BMD- 2015]- on Reclast q 72M; FEB 2017- Start Prolia q 6 M     Prostate cancer ([place])   03/22/2009 Initial Diagnosis    Prostate cancer ([place])       INTERVAL HISTORY:  A very pleasant 83 year old male patient with above history of prostate cancer recurrent on Intermittent Lupron every 3 months [Intermittent given hot flashes/ severe osteoporosis]/ Prolia q 72M.   He continues to live in the assisted living; is fairly active for his age. He is here to review the results of his CT scan of the abdomen that was ordered for intermittent abdominal pain/ iron deficiency anemia.  Patient denies any more abdominal pain. Denies any more blood in stools.  Notes to have constipation also intermittent blood in stools. Appetite is good. No weight loss. No hot flashes. He feels fairly well for his age. Denies any difficulty urination. No jaw pain.  REVIEW OF SYSTEMS:  A complete 10 point review of system is done which is negative except mentioned above/history of present illness.   PAST MEDICAL HISTORY :  Past Medical History:  Diagnosis Date  . Arthritis   . Colon polyps    adenomatous  . History of fracture of tibia   . History of tachycardia   . Osteoporosis 01/2014   T -3.8 (01/2014)  . Other constipation    chronic  . Prostate cancer ([place])    surgery then lupron  . Unspecified venous (peripheral) insufficiency   . Vitamin B12 deficiency     PAST SURGICAL HISTORY :   Past Surgical History:  Procedure Laterality Date  . BASAL CELL CARCINOMA EXCISION  2004   Left side of face  .  CATARACT EXTRACTION  2000, 2003   both eyes  . COLONOSCOPY     h/o adenomatous polyps  . JOINT REPLACEMENT  2005   Left knee  . JOINT REPLACEMENT  12/13   Right knee  . PROSTATE SURGERY  1996   cancer  . sigmoid resection    . TIBIA FRACTURE SURGERY  1999  . VOLVULUS REDUCTION  12/10    FAMILY HISTORY :   Family History  Problem Relation Age of Onset  . Diabetes Son   . Hypertension Paternal Grandfather     SOCIAL HISTORY:   Social History  Substance Use Topics  . Smoking status: Never Smoker  . Smokeless tobacco: Never Used  . Alcohol use 0.0 - 0.6 oz/week     Comment: in past    ALLERGIES:  is allergic to other.  MEDICATIONS:  Current Outpatient Prescriptions  Medication Sig Dispense Refill  . aspirin 81 MG EC tablet Take 81 mg by mouth every other day.     . Biotin 2.5 MG CAPS Take by mouth.    . bismuth subsalicylate (PEPTO BISMOL) 262 MG/15ML suspension Take 10 mLs by mouth 4 (four) times daily -  before meals and at bedtime.    . Calcium Carbonate-Vitamin D (CALCIUM-D) 600-400 MG-UNIT TABS Take 1 tablet by mouth 2 (two) times daily.     . cholecalciferol (VITAMIN D) 1000 UNITS tablet Take 1,000  Units by mouth daily.      . diphenhydrAMINE (BENADRYL) 25 mg capsule Take 25 mg by mouth every 4 (four) hours as needed for itching or allergies.    . fluticasone (FLONASE) 50 MCG/ACT nasal spray USE 2 SPRAYS IN EACH NOSTRIL DAILY 48 g 3  . ibuprofen (ADVIL,MOTRIN) 200 MG tablet Take 400 mg by mouth daily as needed.    . latanoprost (XALATAN) 0.005 % ophthalmic solution Place 1 drop into both eyes at bedtime.    Marland Kitchen leuprolide (LUPRON) 30 MG injection Inject 30 mg into the muscle every 4 (four) months. Dose and interval not clear yet    . loratadine (CLARITIN) 10 MG tablet TAKE 1 TABLET DAILY 90 tablet 3  . sennosides-docusate sodium (SENOKOT-S) 8.6-50 MG tablet Take 2 tablets by mouth daily.     . Skin Protectants, Misc. (EUCERIN) cream Apply 1 application topically as  needed for dry skin.    Marland Kitchen traMADol (ULTRAM) 50 MG tablet Take 1 tablet (50 mg total) by mouth 3 (three) times daily as needed. 270 tablet 0  . triamcinolone cream (KENALOG) 0.1 % Apply 1 application topically every other day. Alternating with Ciclopirox    . vitamin B-12 (CYANOCOBALAMIN) 1000 MCG tablet Take 1 tablet (1,000 mcg total) by mouth daily. 90 tablet 3  . vitamin C (ASCORBIC ACID) 500 MG tablet Take 500 mg by mouth daily.    . zoledronic acid (RECLAST) 5 MG/100ML SOLN injection Inject 5 mg into the vein once. ?yearly    . clindamycin (CLEOCIN) 150 MG capsule Take 150 mg by mouth. Take 4 capsules prior to dental work    . polyethylene glycol powder (GLYCOLAX/MIRALAX) powder FILL TO LINE (17 GRAMS ), MIX AND DRINK TWICE A DAY AND 8.5 GRAMS AT NOON (Patient not taking: Reported on 10/03/2016) 510 g 3   No current facility-administered medications for this visit.    Facility-Administered Medications Ordered in Other Visits  Medication Dose Route Frequency Provider Last Rate Last Dose  . leuprolide (LUPRON) injection 30 mg  30 mg Intramuscular Once Leia Alf, MD        PHYSICAL EXAMINATION: ECOG PERFORMANCE STATUS: 1 - Symptomatic but completely ambulatory  BP 122/75   Pulse 74   Temp (!) 97.2 F (36.2 C) (Tympanic)   Resp 20   Ht 6' 2.5" (1.892 m)   Wt 180 lb (81.6 kg)   BMI 22.80 kg/m   Filed Weights   10/08/16 0954  Weight: 180 lb (81.6 kg)    GENERAL: Well-nourished well-developed; Alert, no distress and comfortable.  Alone; walks with a rolling walker EYES: no pallor or icterus OROPHARYNX: no thrush or ulceration; good dentition  NECK: supple, no masses felt LYMPH:  no palpable lymphadenopathy in the cervical, axillary or inguinal regions LUNGS: clear to auscultation and  No wheeze or crackles HEART/CVS: regular rate & rhythm and no murmurs; No lower extremity edema ABDOMEN:abdomen soft, non-tender and normal bowel sounds Musculoskeletal:no cyanosis of digits  and no clubbing  PSYCH: alert & oriented x 3 with fluent speech NEURO: no focal motor/sensory deficits SKIN:  no rashes or significant lesions  LABORATORY DATA:  I have reviewed the data as listed    Component Value Date/Time   NA 136 10/03/2016 1341   NA 140 02/11/2012 0415   K 4.1 10/03/2016 1341   K 3.9 02/11/2012 0415   CL 105 10/03/2016 1341   CL 108 (H) 02/11/2012 0415   CO2 21 (L) 10/03/2016 1341   CO2 25 02/11/2012  0415   GLUCOSE 189 (H) 10/03/2016 1341   GLUCOSE 125 (H) 02/11/2012 0415   BUN 21 (H) 10/03/2016 1341   BUN 12 02/11/2012 0415   CREATININE 1.06 10/03/2016 1341   CREATININE 1.00 03/23/2014 1400   CALCIUM 8.8 (L) 10/03/2016 1341   CALCIUM 8.3 (L) 03/23/2014 1400   PROT 7.7 07/02/2016 1344   PROT 7.0 03/07/2014 1414   ALBUMIN 3.9 07/02/2016 1344   ALBUMIN 3.7 03/07/2014 1414   AST 24 07/02/2016 1344   AST 16 03/07/2014 1414   ALT 12 (L) 07/02/2016 1344   ALT 19 03/07/2014 1414   ALKPHOS 63 07/02/2016 1344   ALKPHOS 69 03/07/2014 1414   BILITOT 0.5 07/02/2016 1344   BILITOT 0.3 03/07/2014 1414   GFRNONAA 58 (L) 10/03/2016 1341   GFRNONAA >60 03/23/2014 1400   GFRNONAA 49 (L) 09/02/2013 1428   GFRAA >60 10/03/2016 1341   GFRAA >60 03/23/2014 1400   GFRAA 57 (L) 09/02/2013 1428    No results found for: SPEP, UPEP  Lab Results  Component Value Date   WBC 6.5 10/03/2016   NEUTROABS 3.4 10/03/2016   HGB 11.7 (L) 10/03/2016   HCT 33.8 (L) 10/03/2016   MCV 86.1 10/03/2016   PLT 137 (L) 10/03/2016      Chemistry      Component Value Date/Time   NA 136 10/03/2016 1341   NA 140 02/11/2012 0415   K 4.1 10/03/2016 1341   K 3.9 02/11/2012 0415   CL 105 10/03/2016 1341   CL 108 (H) 02/11/2012 0415   CO2 21 (L) 10/03/2016 1341   CO2 25 02/11/2012 0415   BUN 21 (H) 10/03/2016 1341   BUN 12 02/11/2012 0415   CREATININE 1.06 10/03/2016 1341   CREATININE 1.00 03/23/2014 1400   GLU 114 06/30/2016      Component Value Date/Time   CALCIUM 8.8 (L)  10/03/2016 1341   CALCIUM 8.3 (L) 03/23/2014 1400   ALKPHOS 63 07/02/2016 1344   ALKPHOS 69 03/07/2014 1414   AST 24 07/02/2016 1344   AST 16 03/07/2014 1414   ALT 12 (L) 07/02/2016 1344   ALT 19 03/07/2014 1414   BILITOT 0.5 07/02/2016 1344   BILITOT 0.3 03/07/2014 1414     Results for Benjamin, Villarreal (MRN [id number]) as of 10/03/2016 14:50  Ref. Range 06/25/2015 10:10 10/03/2015 10:22 01/03/2016 09:50 04/04/2016 09:56 07/02/2016 13:44  PSA Latest Ref Range: 0.00 - 4.00 ng/mL 2.50 2.19 2.35 2.23 3.98        ASSESSMENT & PLAN:   Prostate cancer ([place]) # #CASTRATE RESISTANT PROSTATE CANCER- Biochemical recurrence versus metastatic disease [will need bone scan; will discuss at next visit- on Lupron every 3 months [last NOV 2017- sec to osteoporosis]. However, PSA aug 10th 2018- 5.37.   # Given worsening- PSA at 5.37 [testosterone] recommend re-starting Lupron today.   # Left hydronephrosis/? Bladder mass at UPJ- recommend referral to Dr.Brandon; Urology. Also review the scans at tumor conference. Creat 1.0/at baseline.   # Osteoporosis- Reviewed the bone density from 2015 - severe osteoporosis. on Prolia q 6 M- started in feb 2017.   # Mild Anemia Hb 11/chronic; May 2018- Iron def ; UA-NEG for blood.   # follow up with me in 3 week/labs-PSA/possible venofer.   # I reviewed the blood work- with the patient in detail; also reviewed the imaging independently [as summarized above]; and with the patient in detail.      Cammie Sickle, MD 10/08/2016 11:26 AM

## 2016-10-09 ENCOUNTER — Encounter: Payer: Self-pay | Admitting: Internal Medicine

## 2016-10-09 ENCOUNTER — Other Ambulatory Visit: Payer: Self-pay | Admitting: *Deleted

## 2016-10-09 DIAGNOSIS — N329 Bladder disorder, unspecified: Secondary | ICD-10-CM | POA: Diagnosis not present

## 2016-10-09 DIAGNOSIS — I700 Atherosclerosis of aorta: Secondary | ICD-10-CM | POA: Insufficient documentation

## 2016-10-09 DIAGNOSIS — N1330 Unspecified hydronephrosis: Secondary | ICD-10-CM | POA: Diagnosis not present

## 2016-10-09 DIAGNOSIS — C61 Malignant neoplasm of prostate: Secondary | ICD-10-CM

## 2016-10-09 DIAGNOSIS — Z79899 Other long term (current) drug therapy: Secondary | ICD-10-CM | POA: Diagnosis not present

## 2016-10-09 DIAGNOSIS — R9721 Rising PSA following treatment for malignant neoplasm of prostate: Secondary | ICD-10-CM | POA: Diagnosis not present

## 2016-10-09 DIAGNOSIS — Z79818 Long term (current) use of other agents affecting estrogen receptors and estrogen levels: Secondary | ICD-10-CM | POA: Diagnosis not present

## 2016-10-09 LAB — OCCULT BLOOD X 1 CARD TO LAB, STOOL
Fecal Occult Bld: NEGATIVE
Fecal Occult Bld: NEGATIVE

## 2016-10-30 ENCOUNTER — Other Ambulatory Visit: Payer: Medicare Other

## 2016-10-30 ENCOUNTER — Ambulatory Visit: Payer: Medicare Other | Admitting: Internal Medicine

## 2016-10-30 ENCOUNTER — Ambulatory Visit: Payer: Medicare Other

## 2016-10-30 DIAGNOSIS — D1800 Hemangioma unspecified site: Secondary | ICD-10-CM | POA: Diagnosis not present

## 2016-10-30 DIAGNOSIS — L578 Other skin changes due to chronic exposure to nonionizing radiation: Secondary | ICD-10-CM | POA: Diagnosis not present

## 2016-10-30 DIAGNOSIS — D692 Other nonthrombocytopenic purpura: Secondary | ICD-10-CM | POA: Diagnosis not present

## 2016-10-30 DIAGNOSIS — L821 Other seborrheic keratosis: Secondary | ICD-10-CM | POA: Diagnosis not present

## 2016-10-30 DIAGNOSIS — Z85828 Personal history of other malignant neoplasm of skin: Secondary | ICD-10-CM | POA: Diagnosis not present

## 2016-10-30 DIAGNOSIS — L820 Inflamed seborrheic keratosis: Secondary | ICD-10-CM | POA: Diagnosis not present

## 2016-10-30 DIAGNOSIS — D229 Melanocytic nevi, unspecified: Secondary | ICD-10-CM | POA: Diagnosis not present

## 2016-10-30 DIAGNOSIS — L84 Corns and callosities: Secondary | ICD-10-CM | POA: Diagnosis not present

## 2016-10-30 DIAGNOSIS — Z1283 Encounter for screening for malignant neoplasm of skin: Secondary | ICD-10-CM | POA: Diagnosis not present

## 2016-10-30 DIAGNOSIS — L812 Freckles: Secondary | ICD-10-CM | POA: Diagnosis not present

## 2016-10-31 ENCOUNTER — Ambulatory Visit (INDEPENDENT_AMBULATORY_CARE_PROVIDER_SITE_OTHER): Payer: Medicare Other | Admitting: Urology

## 2016-10-31 ENCOUNTER — Encounter: Payer: Self-pay | Admitting: Urology

## 2016-10-31 VITALS — BP 95/54 | HR 82 | Ht 74.5 in | Wt 175.7 lb

## 2016-10-31 DIAGNOSIS — N3289 Other specified disorders of bladder: Secondary | ICD-10-CM | POA: Diagnosis not present

## 2016-10-31 DIAGNOSIS — C61 Malignant neoplasm of prostate: Secondary | ICD-10-CM | POA: Diagnosis not present

## 2016-10-31 DIAGNOSIS — N1330 Unspecified hydronephrosis: Secondary | ICD-10-CM | POA: Diagnosis not present

## 2016-10-31 NOTE — Progress Notes (Signed)
10/31/2016 10:00 AM   Benjamin Villarreal [birth year] [id number]  Referring provider: Venia Carbon, MD [address] [place], [place] [address]  Chief Complaint  Patient presents with  . Prostate Cancer    HPI: 83 year old male referred by Dr. Rogue Bussing at the cancer center for further evaluation of left-sided hydronephrosis with an enhancing lesion at the left UVJ measuring 1.9 cm concerning for possible bladder malignancy.  He has new left periaortic lymphadenopathy measuring 9 mm as well as the 9 mm left iliac lymph node.  He also has an incidental hyperdense lesion originating from the left mid kidney measuring 1.3 cm which is not definitively characterized.  He does have a personal history of prostate cancer status post open prostatectomy 1996. He developed biochemical recurrence and is been managed on intermittent ADT.   He had a brief break from Lupron, last injection 01/11/2016 but resumed after PSA began to rise. More recently, his PSA has began to slowly rise Most recently to 5.34 on 10/03/2016 up from 2.23 on 218.   Most recent creatinine around the time of the CT scan on 10/03/2016 normal, 1.06.  He denies any recent flank pain, gross hematuria, or any other urinary symptoms other than urinary frequency which is increased over the past week or so. No dysuria.     PMH: Past Medical History:  Diagnosis Date  . Aortic atherosclerosis ([place])    Noted on CT  . Arthritis   . Colon polyps    adenomatous  . History of fracture of tibia   . History of tachycardia   . Osteoporosis 01/2014   T -3.8 (01/2014)  . Other constipation    chronic  . Prostate cancer ([place])    surgery then lupron  . Unspecified venous (peripheral) insufficiency   . Vitamin B12 deficiency     Surgical History: Past Surgical History:  Procedure Laterality Date  . BASAL CELL CARCINOMA EXCISION  2004   Left side of face  . CATARACT EXTRACTION  2000, 2003   both eyes  . COLONOSCOPY     h/o  adenomatous polyps  . JOINT REPLACEMENT  2005   Left knee  . JOINT REPLACEMENT  12/13   Right knee  . PROSTATE SURGERY  1996   cancer  . sigmoid resection    . TIBIA FRACTURE SURGERY  1999  . VOLVULUS REDUCTION  12/10    Home Medications:  Allergies as of 10/31/2016      Reactions   Other    RAW ONIONS- SINUS REACTION      Medication List       Accurate as of 10/31/16 10:00 AM. Always use your most recent med list.          aspirin 81 MG EC tablet Take 81 mg by mouth every other day.   Biotin 2.5 MG Caps Take by mouth.   bismuth subsalicylate 725 DG/64QI suspension Commonly known as:  PEPTO BISMOL Take 10 mLs by mouth 4 (four) times daily -  before meals and at bedtime.   Calcium-D 600-400 MG-UNIT Tabs Take 1 tablet by mouth 2 (two) times daily.   cholecalciferol 1000 units tablet Commonly known as:  VITAMIN D Take 1,000 Units by mouth daily.   clindamycin 150 MG capsule Commonly known as:  CLEOCIN Take 150 mg by mouth. Take 4 capsules prior to dental work   diphenhydrAMINE 25 mg capsule Commonly known as:  BENADRYL Take 25 mg by mouth every 4 (four) hours as needed for  itching or allergies.   eucerin cream Apply 1 application topically as needed for dry skin.   fluticasone 50 MCG/ACT nasal spray Commonly known as:  FLONASE USE 2 SPRAYS IN EACH NOSTRIL DAILY   ibuprofen 200 MG tablet Commonly known as:  ADVIL,MOTRIN Take 400 mg by mouth daily as needed.   latanoprost 0.005 % ophthalmic solution Commonly known as:  XALATAN Place 1 drop into both eyes at bedtime.   leuprolide 30 MG injection Commonly known as:  LUPRON Inject 30 mg into the muscle every 4 (four) months. Dose and interval not clear yet   loratadine 10 MG tablet Commonly known as:  CLARITIN TAKE 1 TABLET DAILY   polyethylene glycol powder powder Commonly known as:  GLYCOLAX/MIRALAX FILL TO LINE (17 GRAMS ), MIX AND DRINK TWICE A DAY AND 8.5 GRAMS AT NOON   sennosides-docusate  sodium 8.6-50 MG tablet Commonly known as:  SENOKOT-S Take 2 tablets by mouth daily.   traMADol 50 MG tablet Commonly known as:  ULTRAM Take 1 tablet (50 mg total) by mouth 3 (three) times daily as needed.   triamcinolone cream 0.1 % Commonly known as:  KENALOG Apply 1 application topically every other day. Alternating with Ciclopirox   vitamin B-12 1000 MCG tablet Commonly known as:  CYANOCOBALAMIN Take 1 tablet (1,000 mcg total) by mouth daily.   vitamin C 500 MG tablet Commonly known as:  ASCORBIC ACID Take 500 mg by mouth daily.   zoledronic acid 5 MG/100ML Soln injection Commonly known as:  RECLAST Inject 5 mg into the vein once. ?yearly       Allergies:  Allergies  Allergen Reactions  . Other     RAW ONIONS- SINUS REACTION    Family History: Family History  Problem Relation Age of Onset  . Diabetes Son   . Hypertension Paternal Grandfather   . Prostate cancer Neg Hx   . Bladder Cancer Neg Hx   . Kidney cancer Neg Hx     Social History:  reports that he has never smoked. He has never used smokeless tobacco. He reports that he drinks alcohol. His drug history is not on file.  ROS: UROLOGY Frequent Urination?: Yes Hard to postpone urination?: Yes Burning/pain with urination?: No Get up at night to urinate?: Yes Leakage of urine?: Yes Urine stream starts and stops?: No Trouble starting stream?: Yes Do you have to strain to urinate?: Yes Blood in urine?: No Urinary tract infection?: No Sexually transmitted disease?: No Injury to kidneys or bladder?: No Painful intercourse?: No Weak stream?: No Erection problems?: No Penile pain?: No  Gastrointestinal Nausea?: No Vomiting?: No Indigestion/heartburn?: No Diarrhea?: No Constipation?: No  Constitutional Fever: No Night sweats?: No Weight loss?: No Fatigue?: No  Skin Skin rash/lesions?: No Itching?: No  Eyes Blurred vision?: Yes Double vision?: Yes  Ears/Nose/Throat Sore throat?:  No Sinus problems?: Yes  Hematologic/Lymphatic Swollen glands?: No Easy bruising?: No  Cardiovascular Leg swelling?: No Chest pain?: No  Respiratory Cough?: Yes Shortness of breath?: No  Endocrine Excessive thirst?: No  Musculoskeletal Back pain?: No Joint pain?: No  Neurological Headaches?: No Dizziness?: No  Psychologic Depression?: No Anxiety?: No  Physical Exam: BP (!) 95/54 (BP Location: Left Arm, Patient Position: Sitting, Cuff Size: Normal)   Pulse 82   Ht 6' 2.5" (1.892 m)   Wt 175 lb 11.2 oz (79.7 kg)   BMI 22.26 kg/m   Constitutional:  Alert and oriented, No acute distress.  Elderly, ambulating with walker. Unaccompanied today. HEENT: [place]  AT, moist mucus membranes.  Trachea midline, no masses. Cardiovascular: No clubbing, cyanosis, or edema. Respiratory: Normal respiratory effort, no increased work of breathing. GI: Abdomen is soft, nontender, nondistended, no abdominal masses GU: No CVA tenderness. Normal circumcised phallus with bilateral descended testicles. Skin: No rashes, bruises or suspicious lesions. Neurologic: Grossly intact, no focal deficits, moving all 4 extremities. Psychiatric: Normal mood and affect.  Laboratory Data: Lab Results  Component Value Date   WBC 6.5 10/03/2016   HGB 11.7 (L) 10/03/2016   HCT 33.8 (L) 10/03/2016   MCV 86.1 10/03/2016   PLT 137 (L) 10/03/2016    Lab Results  Component Value Date   CREATININE 1.06 10/03/2016    Lab Results  Component Value Date   PSA 3.98 07/02/2016   PSA 2.23 04/04/2016   PSA 2.35 01/03/2016    Lab Results  Component Value Date   TESTOSTERONE <3 (L) 10/03/2016   Urinalysis    Component Value Date/Time   COLORURINE YELLOW (A) 10/03/2016 1531   APPEARANCEUR CLEAR (A) 10/03/2016 1531   LABSPEC 1.008 10/03/2016 1531   PHURINE 5.0 10/03/2016 1531   GLUCOSEU NEGATIVE 10/03/2016 1531   HGBUR NEGATIVE 10/03/2016 1531   BILIRUBINUR NEGATIVE 10/03/2016 1531   KETONESUR  NEGATIVE 10/03/2016 1531   PROTEINUR NEGATIVE 10/03/2016 1531   NITRITE NEGATIVE 10/03/2016 1531   LEUKOCYTESUR NEGATIVE 10/03/2016 1531    Pertinent Imaging: CLINICAL DATA:  History of prostate cancer. Generalized abdominal pain.  EXAM: CT ABDOMEN AND PELVIS WITH CONTRAST  TECHNIQUE: Multidetector CT imaging of the abdomen and pelvis was performed using the standard protocol following bolus administration of intravenous contrast.  CONTRAST:  115mL ISOVUE-300 IOPAMIDOL (ISOVUE-300) INJECTION 61%  COMPARISON:  03/05/2010.  FINDINGS: Lower chest: No acute abnormality.  Hepatobiliary: Mild diffuse hepatic steatosis identified. The gallbladder appears within normal limits. No biliary dilatation identified.  Pancreas: The pancreas is normal.  Spleen: Normal in size without focal abnormality.  Adrenals/Urinary Tract: The adrenal glands are normal. Bilateral kidney cysts are identified. There is a hyperdense lesion arising from the lateral cortex of the left mid kidney measuring 1.3 cm, image 33 of series 2. This may represent a hemorrhagic/ proteinaceous cyst or solid enhancing kidney lesion. New left-sided hydronephrosis and hydroureter is identified to the level of the UVJ. Here, there is a soft tissue lesion which measures 1.9 by 1.7 cm, image 74 of series 2.  Stomach/Bowel: The stomach appears normal. The small bowel loops have a normal course and caliber. The appendix is visualized and appears normal. No pathologic dilatation of the colon. Postsurgical changes involving the sigmoid colon noted.  Vascular/Lymphatic: Aortic atherosclerosis. No aneurysm. There is a single left periaortic lymph node which is new from previous exam measuring 9 mm, image 38 of series 2. Left common iliac lymph node measures 9 mm. Previous pelvic lymph node dissection.  Reproductive: Prostatectomy has been performed.  Other: No free fluid or fluid collections within the  abdomen or pelvis.  Musculoskeletal: Degenerative disc disease identified within the lumbar spine.  IMPRESSION: 1. Left-sided hydronephrosis. There is a enhancing lesion at the left UPJ measuring 1.9 cm and suspicious for a urothelial carcinoma. 2. Indeterminate hyperdense lesion arises from the lateral cortex of the left kidney. This may represent a hemorrhagic cyst or solid enhancing kidney mass. Further investigation with kidney protocol MRI is advised. 3.  Aortic Atherosclerosis (ICD10-I70.0). 4. Hepatic steatosis.   Electronically Signed   By: Kerby Moors M.D.   On: 10/06/2016 14:48  Cystoscopy Procedure Note  Patient identification was confirmed, informed consent was obtained, and patient was prepped using Betadine solution.  Lidocaine jelly was administered per urethral meatus.    Preoperative abx where received prior to procedure.     Pre-Procedure: - Inspection reveals a normal caliber ureteral meatus.  Procedure: The flexible cystoscope was introduced without difficulty - No urethral strictures/lesions are present. - Surgically absent prostate  - Normal bladder neck - Bilateral ureteral orifices identified, left UO did seem somewhat displaced with mass effect behind the trigone - Bladder mucosa  reveals no ulcers, tumors, or lesions - No bladder stones - Milderate trabeculation  Retroflexion as above, normal bladder neck with slight asymmetry of the left trigone with mass effect into the bladder but normal overlying urothelium   Post-Procedure: - Patient tolerated the procedure well  Assessment/ Plan:  CT scan personally reviewed today. This is also discussed personally with Dr. Clovis Riley.  Assessment & Plan:    1. Bladder mass Lesion which appears to be in the bladder on CT scan is in fact either posterior to the bladder with mass effect (recurrent prsotate cancer) or possibly within the left distal ureter.    Options including CT urogram versus  direct visualization, possible biopsy, and left ureteral stent were reviewed. Given my concern for nondiagnostic CT scan and lack of ability to intervene/obtain tissue, I would prefer to proceed to the operating room. Patient is agreeable to this plan. I also discussed this with his daughter is also agreeable.  Plan for cystoscopy, bilateral retrograde pyelogram, left ureteroscopy, possible left ureteral biopsy, left ureteral stent placement. Risks and benefits were discussed in detail. All questions were answered. He may continue his baby aspirin.  2. Hydronephrosis of left kidney Hydroureteronephrosis down to the level of the UVJ secondary to obstructing mass, either posterior to the bladder in the left distal ureter. Diagnostic workup as above.  3. Prostate cancer ([place]) Status post prostatectomy 1999 with biochemical recurrence on intermittent ADT, resumed 1 month ago in the setting of rising PSA.   Hollice Espy, MD  [facility] [address], [place], [place] [address] [phone]  Case was reviewed with Dr. Rogue Bussing. I also personally discussed the case by telephone today with the patient's daughter, Baxter Flattery [phone]).  I spent 60 min with this patient of which greater than 50% was spent in counseling and coordination of care with the patient.

## 2016-11-03 ENCOUNTER — Inpatient Hospital Stay: Payer: Medicare Other

## 2016-11-03 ENCOUNTER — Inpatient Hospital Stay: Payer: Medicare Other | Attending: Internal Medicine

## 2016-11-03 ENCOUNTER — Inpatient Hospital Stay (HOSPITAL_BASED_OUTPATIENT_CLINIC_OR_DEPARTMENT_OTHER): Payer: Medicare Other | Admitting: Internal Medicine

## 2016-11-03 VITALS — BP 114/64 | HR 67 | Temp 97.6°F | Resp 20 | Ht 74.5 in | Wt 177.0 lb

## 2016-11-03 DIAGNOSIS — I38 Endocarditis, valve unspecified: Secondary | ICD-10-CM

## 2016-11-03 DIAGNOSIS — M810 Age-related osteoporosis without current pathological fracture: Secondary | ICD-10-CM | POA: Insufficient documentation

## 2016-11-03 DIAGNOSIS — Z7982 Long term (current) use of aspirin: Secondary | ICD-10-CM | POA: Insufficient documentation

## 2016-11-03 DIAGNOSIS — E538 Deficiency of other specified B group vitamins: Secondary | ICD-10-CM | POA: Diagnosis not present

## 2016-11-03 DIAGNOSIS — C61 Malignant neoplasm of prostate: Secondary | ICD-10-CM

## 2016-11-03 DIAGNOSIS — Z79899 Other long term (current) drug therapy: Secondary | ICD-10-CM | POA: Insufficient documentation

## 2016-11-03 DIAGNOSIS — I700 Atherosclerosis of aorta: Secondary | ICD-10-CM | POA: Diagnosis not present

## 2016-11-03 DIAGNOSIS — D509 Iron deficiency anemia, unspecified: Secondary | ICD-10-CM | POA: Diagnosis not present

## 2016-11-03 DIAGNOSIS — N329 Bladder disorder, unspecified: Secondary | ICD-10-CM | POA: Insufficient documentation

## 2016-11-03 DIAGNOSIS — Z86010 Personal history of colonic polyps: Secondary | ICD-10-CM | POA: Insufficient documentation

## 2016-11-03 DIAGNOSIS — K5900 Constipation, unspecified: Secondary | ICD-10-CM | POA: Insufficient documentation

## 2016-11-03 LAB — PSA: Prostatic Specific Antigen: 8.02 ng/mL — ABNORMAL HIGH (ref 0.00–4.00)

## 2016-11-03 LAB — CBC WITH DIFFERENTIAL/PLATELET
BASOS ABS: 0.1 10*3/uL (ref 0–0.1)
BASOS PCT: 3 %
EOS PCT: 5 %
Eosinophils Absolute: 0.3 10*3/uL (ref 0–0.7)
HCT: 34.7 % — ABNORMAL LOW (ref 40.0–52.0)
Hemoglobin: 12 g/dL — ABNORMAL LOW (ref 13.0–18.0)
Lymphocytes Relative: 32 %
Lymphs Abs: 1.8 10*3/uL (ref 1.0–3.6)
MCH: 29.9 pg (ref 26.0–34.0)
MCHC: 34.7 g/dL (ref 32.0–36.0)
MCV: 86.3 fL (ref 80.0–100.0)
MONO ABS: 0.5 10*3/uL (ref 0.2–1.0)
MONOS PCT: 9 %
Neutro Abs: 2.9 10*3/uL (ref 1.4–6.5)
Neutrophils Relative %: 51 %
PLATELETS: 130 10*3/uL — AB (ref 150–440)
RBC: 4.02 MIL/uL — ABNORMAL LOW (ref 4.40–5.90)
RDW: 14.5 % (ref 11.5–14.5)
WBC: 5.5 10*3/uL (ref 3.8–10.6)

## 2016-11-03 NOTE — Assessment & Plan Note (Addendum)
# #  CASTRATE RESISTANT PROSTATE CANCER- Biochemical recurrence versus metastatic disease. PSA aug 10th 2018- 5.37. Order bone scan before the next visit.  Ee-started on Lupron 22.5 on 8/16 /2018.   # Left hydronephrosis/? Bladder mass at UPJ- s/p  Dr.Brandon; Urology; awaiting OR possible stent plancement. Reviewed the note from urology.  # Osteoporosis- Reviewed the bone density from 2015 - severe osteoporosis. on Prolia q 6 M- last 10/03/2016   # Iron deficiency anemia- question related to urinary blood loss. By mouth iron tolerating well. Hemoglobin 12. Hold off any IV iron at this time.  # follow up in 6 weeks; cbc/cmp/psa; bone scan prior.

## 2016-11-03 NOTE — Progress Notes (Signed)
[facility] OFFICE PROGRESS NOTE  Patient Care Team: Venia Carbon, MD as PCP - General   SUMMARY OF ONCOLOGIC HISTORY: Oncology History    # 1996 PROSTATE CANCER [Gleason score 2+3] s/p Radical Prostatectomy [[facility]]; ?Biochem RECURRENT PROSTATE CANCER-Hormone sensitive;  Intermittent Lupron; on Lupron q 68M [Jan 2016 PSA- 7.9].  HOLD LUPRON in MAY 2017; AUg 2017- Re-start  # Osteoporosis [BMD- 2015]- on Reclast q 66M; FEB 2017- Start Prolia q 6 M     Prostate cancer ([place])   03/22/2009 Initial Diagnosis    Prostate cancer ([place])      INTERVAL HISTORY:  A very pleasant 83 year old male patient with above history of  Castrate resistant recurrent prostate cancer- on Lupron was evaluated by urology for his hydronephrosis. He is awaiting a repeat cystoscopy/biopsy/possible stent placement in the OR in the next few weeks.   Patient denies any more abdominal pain. Admits to black loose stools since he is on iron pills. Otherwise no blood. Denies any abdominal discomfort while on iron pills. Appetite is good. No weight loss. No hot flashes. He feels fairly well for his age. Denies any difficulty urination. No jaw pain.  REVIEW OF SYSTEMS:  A complete 10 point review of system is done which is negative except mentioned above/history of present illness.   PAST MEDICAL HISTORY :  Past Medical History:  Diagnosis Date  . Aortic atherosclerosis ([place])    Noted on CT  . Arthritis   . Colon polyps    adenomatous  . History of fracture of tibia   . History of tachycardia   . Osteoporosis 01/2014   T -3.8 (01/2014)  . Other constipation    chronic  . Prostate cancer ([place])    surgery then lupron  . Unspecified venous (peripheral) insufficiency   . Vitamin B12 deficiency     PAST SURGICAL HISTORY :   Past Surgical History:  Procedure Laterality Date  . BASAL CELL CARCINOMA EXCISION  2004   Left side of face  . CATARACT EXTRACTION  2000, 2003   both eyes  . COLONOSCOPY      h/o adenomatous polyps  . JOINT REPLACEMENT  2005   Left knee  . JOINT REPLACEMENT  12/13   Right knee  . PROSTATE SURGERY  1996   cancer  . sigmoid resection    . TIBIA FRACTURE SURGERY  1999  . VOLVULUS REDUCTION  12/10    FAMILY HISTORY :   Family History  Problem Relation Age of Onset  . Diabetes Son   . Hypertension Paternal Grandfather   . Prostate cancer Neg Hx   . Bladder Cancer Neg Hx   . Kidney cancer Neg Hx     SOCIAL HISTORY:   Social History  Substance Use Topics  . Smoking status: Never Smoker  . Smokeless tobacco: Never Used  . Alcohol use 0.0 - 0.6 oz/week     Comment: in past    ALLERGIES:  is allergic to other.  MEDICATIONS:  Current Outpatient Prescriptions  Medication Sig Dispense Refill  . aspirin 81 MG EC tablet Take 81 mg by mouth every other day.     . Biotin 2.5 MG CAPS Take by mouth.    . Calcium Carbonate-Vitamin D (CALCIUM-D) 600-400 MG-UNIT TABS Take 1 tablet by mouth 2 (two) times daily.     . carbamide peroxide (DEBROX) 6.5 % OTIC solution 5 drops as needed (ear wax impaction).    . cholecalciferol (VITAMIN D) 1000 UNITS tablet  Take 1,000 Units by mouth daily.      . cyclobenzaprine (FLEXERIL) 5 MG tablet Take 5 mg by mouth at bedtime as needed for muscle spasms.    . fluticasone (FLONASE) 50 MCG/ACT nasal spray USE 2 SPRAYS IN EACH NOSTRIL DAILY 48 g 3  . latanoprost (XALATAN) 0.005 % ophthalmic solution Place 1 drop into both eyes at bedtime.    Marland Kitchen leuprolide (LUPRON) 30 MG injection Inject 30 mg into the muscle every 4 (four) months. Dose and interval not clear yet    . loratadine (CLARITIN) 10 MG tablet TAKE 1 TABLET DAILY 90 tablet 3  . nystatin (NYSTATIN) powder Apply topically 2 (two) times daily as needed (to groin and abd fold areas as needed for rash).    . polyethylene glycol powder (GLYCOLAX/MIRALAX) powder FILL TO LINE (17 GRAMS ), MIX AND DRINK TWICE A DAY AND 8.5 GRAMS AT NOON 510 g 3  . sennosides-docusate sodium  (SENOKOT-S) 8.6-50 MG tablet Take 2 tablets by mouth daily.     . Skin Protectants, Misc. (EUCERIN) cream Apply 1 application topically as needed for dry skin.    Marland Kitchen traMADol (ULTRAM) 50 MG tablet Take 1 tablet (50 mg total) by mouth 3 (three) times daily as needed. 270 tablet 0  . triamcinolone cream (KENALOG) 0.1 % Apply 1 application topically every other day. Alternating with Ciclopirox    . vitamin B-12 (CYANOCOBALAMIN) 1000 MCG tablet Take 1 tablet (1,000 mcg total) by mouth daily. 90 tablet 3  . vitamin C (ASCORBIC ACID) 500 MG tablet Take 500 mg by mouth daily.    . zoledronic acid (RECLAST) 5 MG/100ML SOLN injection Inject 5 mg into the vein once. ?yearly    . bismuth subsalicylate (PEPTO BISMOL) 262 MG/15ML suspension Take 10 mLs by mouth 4 (four) times daily -  before meals and at bedtime.    . clindamycin (CLEOCIN) 150 MG capsule Take 150 mg by mouth. Take 4 capsules prior to dental work    . diphenhydrAMINE (BENADRYL) 25 mg capsule Take 25 mg by mouth every 4 (four) hours as needed for itching or allergies.    Marland Kitchen ibuprofen (ADVIL,MOTRIN) 200 MG tablet Take 400 mg by mouth daily as needed.     No current facility-administered medications for this visit.    Facility-Administered Medications Ordered in Other Visits  Medication Dose Route Frequency Provider Last Rate Last Dose  . leuprolide (LUPRON) injection 30 mg  30 mg Intramuscular Once Leia Alf, MD        PHYSICAL EXAMINATION: ECOG PERFORMANCE STATUS: 1 - Symptomatic but completely ambulatory  BP 114/64 (BP Location: Right Arm, Patient Position: Sitting)   Pulse 67   Temp 97.6 F (36.4 C) (Tympanic)   Resp 20   Ht 6' 2.5" (1.892 m)   Wt 177 lb (80.3 kg)   BMI 22.42 kg/m   Filed Weights   11/03/16 1020  Weight: 177 lb (80.3 kg)    GENERAL: Well-nourished well-developed; Alert, no distress and comfortable.  Alone; walks with a rolling walker EYES: no pallor or icterus OROPHARYNX: no thrush or ulceration; good  dentition  NECK: supple, no masses felt LYMPH:  no palpable lymphadenopathy in the cervical, axillary or inguinal regions LUNGS: clear to auscultation and  No wheeze or crackles HEART/CVS: regular rate & rhythm and no murmurs; No lower extremity edema ABDOMEN:abdomen soft, non-tender and normal bowel sounds Musculoskeletal:no cyanosis of digits and no clubbing  PSYCH: alert & oriented x 3 with fluent speech NEURO: no focal  motor/sensory deficits SKIN:  no rashes or significant lesions  LABORATORY DATA:  I have reviewed the data as listed    Component Value Date/Time   NA 136 10/03/2016 1341   NA 140 02/11/2012 0415   K 4.1 10/03/2016 1341   K 3.9 02/11/2012 0415   CL 105 10/03/2016 1341   CL 108 (H) 02/11/2012 0415   CO2 21 (L) 10/03/2016 1341   CO2 25 02/11/2012 0415   GLUCOSE 189 (H) 10/03/2016 1341   GLUCOSE 125 (H) 02/11/2012 0415   BUN 21 (H) 10/03/2016 1341   BUN 12 02/11/2012 0415   CREATININE 1.06 10/03/2016 1341   CREATININE 1.00 03/23/2014 1400   CALCIUM 8.8 (L) 10/03/2016 1341   CALCIUM 8.3 (L) 03/23/2014 1400   PROT 7.7 07/02/2016 1344   PROT 7.0 03/07/2014 1414   ALBUMIN 3.9 07/02/2016 1344   ALBUMIN 3.7 03/07/2014 1414   AST 24 07/02/2016 1344   AST 16 03/07/2014 1414   ALT 12 (L) 07/02/2016 1344   ALT 19 03/07/2014 1414   ALKPHOS 63 07/02/2016 1344   ALKPHOS 69 03/07/2014 1414   BILITOT 0.5 07/02/2016 1344   BILITOT 0.3 03/07/2014 1414   GFRNONAA 58 (L) 10/03/2016 1341   GFRNONAA >60 03/23/2014 1400   GFRNONAA 49 (L) 09/02/2013 1428   GFRAA >60 10/03/2016 1341   GFRAA >60 03/23/2014 1400   GFRAA 57 (L) 09/02/2013 1428    No results found for: SPEP, UPEP  Lab Results  Component Value Date   WBC 5.5 11/03/2016   NEUTROABS 2.9 11/03/2016   HGB 12.0 (L) 11/03/2016   HCT 34.7 (L) 11/03/2016   MCV 86.3 11/03/2016   PLT 130 (L) 11/03/2016      Chemistry      Component Value Date/Time   NA 136 10/03/2016 1341   NA 140 02/11/2012 0415   K 4.1  10/03/2016 1341   K 3.9 02/11/2012 0415   CL 105 10/03/2016 1341   CL 108 (H) 02/11/2012 0415   CO2 21 (L) 10/03/2016 1341   CO2 25 02/11/2012 0415   BUN 21 (H) 10/03/2016 1341   BUN 12 02/11/2012 0415   CREATININE 1.06 10/03/2016 1341   CREATININE 1.00 03/23/2014 1400   GLU 114 06/30/2016      Component Value Date/Time   CALCIUM 8.8 (L) 10/03/2016 1341   CALCIUM 8.3 (L) 03/23/2014 1400   ALKPHOS 63 07/02/2016 1344   ALKPHOS 69 03/07/2014 1414   AST 24 07/02/2016 1344   AST 16 03/07/2014 1414   ALT 12 (L) 07/02/2016 1344   ALT 19 03/07/2014 1414   BILITOT 0.5 07/02/2016 1344   BILITOT 0.3 03/07/2014 1414     Results for JASIYAH, PAULDING (MRN [id number]) as of 11/03/2016 10:44  Ref. Range 10/03/2015 10:22 01/03/2016 09:50 04/04/2016 09:56 07/02/2016 13:44 10/03/2016 13:41  PSA Latest Ref Range: 0.00 - 4.00 ng/mL 2.19 2.35 2.23 3.98   Prostatic Specific Antigen Latest Ref Range: 0.00 - 4.00 ng/mL     5.37 (H)         ASSESSMENT & PLAN:   Prostate cancer ([place]) # #CASTRATE RESISTANT PROSTATE CANCER- Biochemical recurrence versus metastatic disease. PSA aug 10th 2018- 5.37. Order bone scan before the next visit.  Ee-started on Lupron 22.5 on 8/16 /2018.   # Left hydronephrosis/? Bladder mass at [facility]- s/p  Dr.Brandon; Urology; awaiting OR possible stent plancement. Reviewed the note from urology.  # Osteoporosis- Reviewed the bone density from 2015 - severe osteoporosis. on Prolia q 6 M-  last 10/03/2016   # Iron deficiency anemia- question related to urinary blood loss. By mouth iron tolerating well. Hemoglobin 12. Hold off any IV iron at this time.  # follow up in 6 weeks; cbc/cmp/psa; bone scan prior.       Cammie Sickle, MD 11/03/2016 12:54 PM

## 2016-11-05 ENCOUNTER — Other Ambulatory Visit: Payer: Self-pay | Admitting: Radiology

## 2016-11-05 ENCOUNTER — Telehealth: Payer: Self-pay | Admitting: Radiology

## 2016-11-05 DIAGNOSIS — N289 Disorder of kidney and ureter, unspecified: Secondary | ICD-10-CM

## 2016-11-05 NOTE — Telephone Encounter (Signed)
Notified pt's daughter, [name], of surgery scheduled 11/19/16, pre-admit testing appt on 11/11/16 @11 :30 & to call day prior to surgery for arrival time to [facility]. Questions answered. Daughter voices understanding. Also notified Luellen Pucker, DON at [facility], of same. Per Dr Erlene Quan, pt may continue ASA 81mg  prior to surgery.

## 2016-11-11 ENCOUNTER — Encounter
Admission: RE | Admit: 2016-11-11 | Discharge: 2016-11-11 | Disposition: A | Discharge status: Home / Self Care | Payer: Medicare Other | Source: Ambulatory Visit | Attending: Urology | Admitting: Urology

## 2016-11-11 DIAGNOSIS — Z01810 Encounter for preprocedural cardiovascular examination: Secondary | ICD-10-CM | POA: Insufficient documentation

## 2016-11-11 DIAGNOSIS — Z01812 Encounter for preprocedural laboratory examination: Secondary | ICD-10-CM | POA: Diagnosis not present

## 2016-11-11 DIAGNOSIS — I10 Essential (primary) hypertension: Secondary | ICD-10-CM | POA: Insufficient documentation

## 2016-11-11 DIAGNOSIS — C61 Malignant neoplasm of prostate: Secondary | ICD-10-CM | POA: Diagnosis not present

## 2016-11-11 DIAGNOSIS — R001 Bradycardia, unspecified: Secondary | ICD-10-CM | POA: Insufficient documentation

## 2016-11-11 HISTORY — DX: Unspecified kyphosis, cervicothoracic region: M40.203

## 2016-11-11 LAB — URINALYSIS, ROUTINE W REFLEX MICROSCOPIC
Bilirubin Urine: NEGATIVE
GLUCOSE, UA: NEGATIVE mg/dL
Hgb urine dipstick: NEGATIVE
KETONES UR: NEGATIVE mg/dL
Leukocytes, UA: NEGATIVE
Nitrite: NEGATIVE
PH: 6 (ref 5.0–8.0)
Protein, ur: NEGATIVE mg/dL
SPECIFIC GRAVITY, URINE: 1.011 (ref 1.005–1.030)

## 2016-11-11 NOTE — Patient Instructions (Signed)
  Your procedure is scheduled on:11/19/16 Report to Day Surgery. [facility]OND FLOOR To find out your arrival time please call [phone] between 1PM - 3PM on 11/18/16  Remember: Instructions that are not followed completely may result in serious medical risk, up to and including death, or upon the discretion of your surgeon and anesthesiologist your surgery may need to be rescheduled.    __X__ 1. Do not eat food after midnight the night before your procedure. No gum chewing or hard candies. You may drink clear liquids up to 2 hours before you are scheduled to arrive for your surgery- DO not drink clear liquids within 2 hours of the start of your surgery.  Clear Liquids include: water, apple juice without pulp, clear carbohydrate drink such as Clearfast of Gartorade, Black Coffee or Tea (Do not add anything to coffee or tea).    ____ 2. No Alcohol for 24 hours before or after surgery.   ____ 3. Do Not Smoke For 24 Hours Prior to Your Surgery.   ____ 4. Bring all medications with you on the day of surgery if instructed.    __X__ 5. Notify your doctor if there is any change in your medical condition     (cold, fever, infections).       Do not wear jewelry, make-up, hairpins, clips or nail polish.  Do not wear lotions, powders, or perfumes. You may wear deodorant.  Do not shave 48 hours prior to surgery. Men may shave face and neck.  Do not bring valuables to the hospital.    [facility] is not responsible for any belongings or valuables.               Contacts, dentures or bridgework may not be worn into surgery.  Leave your suitcase in the car. After surgery it may be brought to your room.  For patients admitted to the hospital, discharge time is determined by your                treatment team.   Patients discharged the day of surgery will not be allowed to drive home.     ____ Take these medicines the morning of surgery with A SIP OF WATER:    1.  NONE  2.   3.   4.  5.  6.  ____ Fleet Enema (as directed)   ____ Use CHG Soap as directed  ____ Use inhalers on the day of surgery  ____ Stop metformin 2 days prior to surgery    ____ Take 1/2 of usual insulin dose the night before surgery and none on the morning of surgery.   _X___ Stop Coumadin/Plavix/aspirin on   STOP ASPIRIN AS INSTRUCTED BY DR Erlene Quan OFFICE ____ Stop Anti-inflammatories on   NO ADVIL UNTIL AFTER SURGERY ____ Stop supplements until after surgery.    ____ Bring C-Pap to the hospitaL

## 2016-11-13 LAB — URINE CULTURE: CULTURE: NO GROWTH

## 2016-11-17 ENCOUNTER — Ambulatory Visit (INDEPENDENT_AMBULATORY_CARE_PROVIDER_SITE_OTHER): Payer: Medicare Other | Admitting: Podiatry

## 2016-11-17 DIAGNOSIS — L84 Corns and callosities: Secondary | ICD-10-CM

## 2016-11-17 DIAGNOSIS — B351 Tinea unguium: Secondary | ICD-10-CM | POA: Diagnosis not present

## 2016-11-17 DIAGNOSIS — M79609 Pain in unspecified limb: Secondary | ICD-10-CM

## 2016-11-17 NOTE — Progress Notes (Signed)
Complaint:  Visit Type: Patient returns to my office for continued preventative foot care services. Complaint: Patient states" my nails have grown long and thick and become painful to walk and wear shoes" Patient has been diagnosed with neuropathy.. The patient presents for preventative foot care services. No changes to ROS  Podiatric Exam: Vascular: dorsalis pedis and posterior tibial pulses are barely  palpable bilateral. Capillary return is immediate. Temperature gradient is WNL. Skin turgor WNL  Sensorium: Diminished  Semmes Weinstein monofilament test. Normal tactile sensation bilaterally. Nail Exam: Pt has thick disfigured discolored nails with subungual debris noted bilateral entire nail hallux through fifth toenails Ulcer Exam: There is no evidence of ulcer or pre-ulcerative changes or infection. Orthopedic Exam: Muscle tone and strength are WNL. No limitations in general ROM. No crepitus or effusions noted. Foot type and digits show no abnormalities.  Rearfoot  DJD noted  B/L Skin:  Porokeratosis sub 1 right. No infection or ulcers  Diagnosis:  Onychomycosis, , Pain in right toe, pain in left toes Porokeratosis right    Treatment & Plan Procedures and Treatment: Consent by patient was obtained for treatment procedures. The patient understood the discussion of treatment and procedures well. All questions were answered thoroughly reviewed. Debridement of mycotic and hypertrophic toenails, 1 through 5 bilateral and clearing of subungual debris. No ulceration, no infection noted. Debride porokeratosis right Return Visit-Office Procedure: Patient instructed to return to the office for a follow up visit 3 months for continued evaluation and treatment.    Gardiner Barefoot DPM

## 2016-11-18 MED ORDER — CEFAZOLIN SODIUM-DEXTROSE 1-4 GM/50ML-% IV SOLN
1.0000 g | INTRAVENOUS | Status: AC
  Administered 2016-11-19: 1 g via INTRAVENOUS

## 2016-11-19 ENCOUNTER — Encounter
Admission: RE | Disposition: A | Discharge status: Home / Self Care | Payer: Self-pay | Source: Ambulatory Visit | Attending: Urology

## 2016-11-19 ENCOUNTER — Ambulatory Visit: Payer: Medicare Other | Admitting: Anesthesiology

## 2016-11-19 ENCOUNTER — Encounter: Payer: Self-pay | Admitting: *Deleted

## 2016-11-19 ENCOUNTER — Ambulatory Visit
Admission: RE | Admit: 2016-11-19 | Discharge: 2016-11-19 | Disposition: A | Discharge status: Home / Self Care | Payer: Medicare Other | Source: Ambulatory Visit | Attending: Urology | Admitting: Urology

## 2016-11-19 ENCOUNTER — Telehealth: Payer: Self-pay | Admitting: Urology

## 2016-11-19 DIAGNOSIS — N1330 Unspecified hydronephrosis: Secondary | ICD-10-CM | POA: Diagnosis not present

## 2016-11-19 DIAGNOSIS — Q628 Other congenital malformations of ureter: Secondary | ICD-10-CM | POA: Insufficient documentation

## 2016-11-19 DIAGNOSIS — Z87442 Personal history of urinary calculi: Secondary | ICD-10-CM | POA: Insufficient documentation

## 2016-11-19 DIAGNOSIS — M1990 Unspecified osteoarthritis, unspecified site: Secondary | ICD-10-CM | POA: Insufficient documentation

## 2016-11-19 DIAGNOSIS — Z7982 Long term (current) use of aspirin: Secondary | ICD-10-CM | POA: Insufficient documentation

## 2016-11-19 DIAGNOSIS — Z79899 Other long term (current) drug therapy: Secondary | ICD-10-CM | POA: Insufficient documentation

## 2016-11-19 DIAGNOSIS — C61 Malignant neoplasm of prostate: Secondary | ICD-10-CM | POA: Insufficient documentation

## 2016-11-19 DIAGNOSIS — I700 Atherosclerosis of aorta: Secondary | ICD-10-CM | POA: Insufficient documentation

## 2016-11-19 DIAGNOSIS — N289 Disorder of kidney and ureter, unspecified: Secondary | ICD-10-CM

## 2016-11-19 DIAGNOSIS — K760 Fatty (change of) liver, not elsewhere classified: Secondary | ICD-10-CM | POA: Diagnosis not present

## 2016-11-19 DIAGNOSIS — N132 Hydronephrosis with renal and ureteral calculous obstruction: Secondary | ICD-10-CM | POA: Diagnosis not present

## 2016-11-19 DIAGNOSIS — N475 Adhesions of prepuce and glans penis: Secondary | ICD-10-CM | POA: Diagnosis not present

## 2016-11-19 DIAGNOSIS — N2889 Other specified disorders of kidney and ureter: Secondary | ICD-10-CM | POA: Diagnosis not present

## 2016-11-19 HISTORY — PX: URETERAL BIOPSY: SHX6688

## 2016-11-19 HISTORY — PX: CYSTOSCOPY W/ RETROGRADES: SHX1426

## 2016-11-19 HISTORY — PX: CYSTOSCOPY WITH URETEROSCOPY AND STENT PLACEMENT: SHX6377

## 2016-11-19 LAB — GLUCOSE, CAPILLARY: GLUCOSE-CAPILLARY: 97 mg/dL (ref 65–99)

## 2016-11-19 SURGERY — CYSTOSCOPY, WITH RETROGRADE PYELOGRAM
Anesthesia: General | Laterality: Left | Wound class: Clean Contaminated

## 2016-11-19 MED ORDER — EPHEDRINE SULFATE 50 MG/ML IJ SOLN
INTRAMUSCULAR | Status: DC | PRN
  Administered 2016-11-19: 10 mg via INTRAVENOUS

## 2016-11-19 MED ORDER — FAMOTIDINE 20 MG PO TABS
ORAL_TABLET | ORAL | Status: AC
  Administered 2016-11-19: 20 mg via ORAL
  Filled 2016-11-19: qty 1

## 2016-11-19 MED ORDER — FENTANYL CITRATE (PF) 100 MCG/2ML IJ SOLN: 25.0000 ug | INTRAMUSCULAR | Status: DC | PRN

## 2016-11-19 MED ORDER — OXYCODONE HCL 5 MG PO TABS: 5.0000 mg | ORAL_TABLET | Freq: Once | ORAL | Status: DC | PRN

## 2016-11-19 MED ORDER — BACITRACIN ZINC 500 UNIT/GM EX OINT
TOPICAL_OINTMENT | CUTANEOUS | Status: AC
  Filled 2016-11-19: qty 28.35

## 2016-11-19 MED ORDER — LIDOCAINE HCL (PF) 2 % IJ SOLN
INTRAMUSCULAR | Status: AC
  Filled 2016-11-19: qty 2

## 2016-11-19 MED ORDER — CEFAZOLIN SODIUM-DEXTROSE 1-4 GM/50ML-% IV SOLN
INTRAVENOUS | Status: AC
  Filled 2016-11-19: qty 50

## 2016-11-19 MED ORDER — PROPOFOL 10 MG/ML IV BOLUS
INTRAVENOUS | Status: DC | PRN
  Administered 2016-11-19: 30 mg via INTRAVENOUS

## 2016-11-19 MED ORDER — ONDANSETRON HCL 4 MG/2ML IJ SOLN
INTRAMUSCULAR | Status: AC
  Filled 2016-11-19: qty 2

## 2016-11-19 MED ORDER — LACTATED RINGERS IV SOLN
INTRAVENOUS | Status: DC
  Administered 2016-11-19: 75 mL/h via INTRAVENOUS

## 2016-11-19 MED ORDER — FENTANYL CITRATE (PF) 100 MCG/2ML IJ SOLN
INTRAMUSCULAR | Status: DC | PRN
  Administered 2016-11-19 (×3): 25 ug via INTRAVENOUS
  Administered 2016-11-19: 50 ug via INTRAVENOUS
  Administered 2016-11-19: 25 ug via INTRAVENOUS

## 2016-11-19 MED ORDER — EPHEDRINE SULFATE 50 MG/ML IJ SOLN
INTRAMUSCULAR | Status: AC
  Filled 2016-11-19: qty 1

## 2016-11-19 MED ORDER — HYDROCODONE-ACETAMINOPHEN 5-325 MG PO TABS: 1.0000 | ORAL_TABLET | Freq: Four times a day (QID) | ORAL | 0 refills | Status: DC | PRN

## 2016-11-19 MED ORDER — FAMOTIDINE 20 MG PO TABS
20.0000 mg | ORAL_TABLET | Freq: Once | ORAL | Status: AC
  Administered 2016-11-19: 20 mg via ORAL

## 2016-11-19 MED ORDER — PROPOFOL 10 MG/ML IV BOLUS
INTRAVENOUS | Status: AC
  Filled 2016-11-19: qty 20

## 2016-11-19 MED ORDER — ONDANSETRON HCL 4 MG/2ML IJ SOLN
INTRAMUSCULAR | Status: DC | PRN
  Administered 2016-11-19: 4 mg via INTRAVENOUS

## 2016-11-19 MED ORDER — IOTHALAMATE MEGLUMINE 43 % IV SOLN
INTRAVENOUS | Status: DC | PRN
  Administered 2016-11-19: 50 mL

## 2016-11-19 MED ORDER — FENTANYL CITRATE (PF) 100 MCG/2ML IJ SOLN
INTRAMUSCULAR | Status: AC
  Filled 2016-11-19: qty 2

## 2016-11-19 MED ORDER — OXYCODONE HCL 5 MG/5ML PO SOLN: 5.0000 mg | Freq: Once | ORAL | Status: DC | PRN

## 2016-11-19 MED ORDER — DEXAMETHASONE SODIUM PHOSPHATE 10 MG/ML IJ SOLN
INTRAMUSCULAR | Status: AC
  Filled 2016-11-19: qty 1

## 2016-11-19 SURGICAL SUPPLY — 40 items
BAG DRAIN CYSTO-URO LG1000N (MISCELLANEOUS) ×4 IMPLANT
BASKET ZERO TIP 1.9FR (BASKET) IMPLANT
BRUSH SCRUB EZ 1% IODOPHOR (MISCELLANEOUS) ×4 IMPLANT
CATH BEACON 5 .035 65 KMP TIP (CATHETERS) ×4 IMPLANT
CATH URETL 5X70 OPEN END (CATHETERS) ×4 IMPLANT
CATH URETL OPEN END 4X70 (CATHETERS) ×4 IMPLANT
CNTNR SPEC 2.5X3XGRAD LEK (MISCELLANEOUS)
CONRAY 43 FOR UROLOGY 50M (MISCELLANEOUS) ×4 IMPLANT
CONT SPEC 4OZ STER OR WHT (MISCELLANEOUS)
CONTAINER SPEC 2.5X3XGRAD LEK (MISCELLANEOUS) IMPLANT
DRAPE UTILITY 15X26 TOWEL STRL (DRAPES) ×4 IMPLANT
DRSG TELFA 4X3 1S NADH ST (GAUZE/BANDAGES/DRESSINGS) ×4 IMPLANT
ELECT REM PT RETURN 9FT ADLT (ELECTROSURGICAL) ×4
ELECTRODE REM PT RTRN 9FT ADLT (ELECTROSURGICAL) ×2 IMPLANT
FIBER LASER LITHO 273 (Laser) IMPLANT
GLIDEWIRE STIFF .35X180X3 HYDR (WIRE) ×4 IMPLANT
GLOVE BIO SURGEON STRL SZ 6.5 (GLOVE) ×9 IMPLANT
GLOVE BIO SURGEONS STRL SZ 6.5 (GLOVE) ×3
GOWN STRL REUS W/ TWL LRG LVL3 (GOWN DISPOSABLE) ×4 IMPLANT
GOWN STRL REUS W/TWL LRG LVL3 (GOWN DISPOSABLE) ×4
GUIDEWIRE GREEN .038 145CM (MISCELLANEOUS) ×4 IMPLANT
INFUSOR MANOMETER BAG 3000ML (MISCELLANEOUS) ×4 IMPLANT
INTRODUCER DILATOR DOUBLE (INTRODUCER) IMPLANT
KIT RM TURNOVER CYSTO AR (KITS) ×4 IMPLANT
NDL SAFETY ECLIPSE 18X1.5 (NEEDLE) ×2 IMPLANT
NEEDLE HYPO 18GX1.5 SHARP (NEEDLE) ×2
PACK CYSTO AR (MISCELLANEOUS) ×4 IMPLANT
SCRUB POVIDONE IODINE 4 OZ (MISCELLANEOUS) ×4 IMPLANT
SENSORWIRE 0.038 NOT ANGLED (WIRE) ×8
SET CYSTO W/LG BORE CLAMP LF (SET/KITS/TRAYS/PACK) ×4 IMPLANT
SHEATH URETERAL 12FRX35CM (MISCELLANEOUS) IMPLANT
SOL .9 NS 3000ML IRR  AL (IV SOLUTION) ×2
SOL .9 NS 3000ML IRR UROMATIC (IV SOLUTION) ×2 IMPLANT
STENT URET 6FRX24 CONTOUR (STENTS) IMPLANT
STENT URET 6FRX26 CONTOUR (STENTS) IMPLANT
STENT URO INLAY 6FRX26CM (STENTS) ×8 IMPLANT
SURGILUBE 2OZ TUBE FLIPTOP (MISCELLANEOUS) ×4 IMPLANT
WATER STERILE IRR 1000ML POUR (IV SOLUTION) ×4 IMPLANT
WATER STERILE IRR 3000ML UROMA (IV SOLUTION) ×4 IMPLANT
WIRE SENSOR 0.038 NOT ANGLED (WIRE) ×4 IMPLANT

## 2016-11-19 NOTE — H&P (View-Only) (Signed)
10/31/2016 10:00 AM   Benjamin Villarreal 1920-05-14 [id number]  Referring provider: Venia Carbon, MD [address] [place],  [id number]  Chief Complaint  Patient presents with  . Prostate Cancer    HPI: 83 year old male referred by Dr. Rogue Bussing at the cancer center for further evaluation of left-sided hydronephrosis with an enhancing lesion at the left UVJ measuring 1.9 cm concerning for possible bladder malignancy.  He has new left periaortic lymphadenopathy measuring 9 mm as well as the 9 mm left iliac lymph node.  He also has an incidental hyperdense lesion originating from the left mid kidney measuring 1.3 cm which is not definitively characterized.  He does have a personal history of prostate cancer status post open prostatectomy 1996. He developed biochemical recurrence and is been managed on intermittent ADT.   He had a brief break from Lupron, last injection 01/11/2016 but resumed after PSA began to rise. More recently, his PSA has began to slowly rise Most recently to 5.34 on 10/03/2016 up from 2.23 on 218.   Most recent creatinine around the time of the CT scan on 10/03/2016 normal, 1.06.  He denies any recent flank pain, gross hematuria, or any other urinary symptoms other than urinary frequency which is increased over the past week or so. No dysuria.     PMH: Past Medical History:  Diagnosis Date  . Aortic atherosclerosis ([place])    Noted on CT  . Arthritis   . Colon polyps    adenomatous  . History of fracture of tibia   . History of tachycardia   . Osteoporosis 01/2014   T -3.8 (01/2014)  . Other constipation    chronic  . Prostate cancer ([place])    surgery then lupron  . Unspecified venous (peripheral) insufficiency   . Vitamin B12 deficiency     Surgical History: Past Surgical History:  Procedure Laterality Date  . BASAL CELL CARCINOMA EXCISION  2004   Left side of face  . CATARACT EXTRACTION  2000, 2003   both eyes  . COLONOSCOPY     h/o  adenomatous polyps  . JOINT REPLACEMENT  2005   Left knee  . JOINT REPLACEMENT  12/13   Right knee  . PROSTATE SURGERY  1996   cancer  . sigmoid resection    . TIBIA FRACTURE SURGERY  1999  . VOLVULUS REDUCTION  12/10    Home Medications:  Allergies as of 10/31/2016      Reactions   Other    RAW ONIONS- SINUS REACTION      Medication List       Accurate as of 10/31/16 10:00 AM. Always use your most recent med list.          aspirin 81 MG EC tablet Take 81 mg by mouth every other day.   Biotin 2.5 MG Caps Take by mouth.   bismuth subsalicylate 749 SW/96PR suspension Commonly known as:  PEPTO BISMOL Take 10 mLs by mouth 4 (four) times daily -  before meals and at bedtime.   Calcium-D 600-400 MG-UNIT Tabs Take 1 tablet by mouth 2 (two) times daily.   cholecalciferol 1000 units tablet Commonly known as:  VITAMIN D Take 1,000 Units by mouth daily.   clindamycin 150 MG capsule Commonly known as:  CLEOCIN Take 150 mg by mouth. Take 4 capsules prior to dental work   diphenhydrAMINE 25 mg capsule Commonly known as:  BENADRYL Take 25 mg by mouth every 4 (four) hours as needed for  itching or allergies.   eucerin cream Apply 1 application topically as needed for dry skin.   fluticasone 50 MCG/ACT nasal spray Commonly known as:  FLONASE USE 2 SPRAYS IN EACH NOSTRIL DAILY   ibuprofen 200 MG tablet Commonly known as:  ADVIL,MOTRIN Take 400 mg by mouth daily as needed.   latanoprost 0.005 % ophthalmic solution Commonly known as:  XALATAN Place 1 drop into both eyes at bedtime.   leuprolide 30 MG injection Commonly known as:  LUPRON Inject 30 mg into the muscle every 4 (four) months. Dose and interval not clear yet   loratadine 10 MG tablet Commonly known as:  CLARITIN TAKE 1 TABLET DAILY   polyethylene glycol powder powder Commonly known as:  GLYCOLAX/MIRALAX FILL TO LINE (17 GRAMS ), MIX AND DRINK TWICE A DAY AND 8.5 GRAMS AT NOON   sennosides-docusate  sodium 8.6-50 MG tablet Commonly known as:  SENOKOT-S Take 2 tablets by mouth daily.   traMADol 50 MG tablet Commonly known as:  ULTRAM Take 1 tablet (50 mg total) by mouth 3 (three) times daily as needed.   triamcinolone cream 0.1 % Commonly known as:  KENALOG Apply 1 application topically every other day. Alternating with Ciclopirox   vitamin B-12 1000 MCG tablet Commonly known as:  CYANOCOBALAMIN Take 1 tablet (1,000 mcg total) by mouth daily.   vitamin C 500 MG tablet Commonly known as:  ASCORBIC ACID Take 500 mg by mouth daily.   zoledronic acid 5 MG/100ML Soln injection Commonly known as:  RECLAST Inject 5 mg into the vein once. ?yearly       Allergies:  Allergies  Allergen Reactions  . Other     RAW ONIONS- SINUS REACTION    Family History: Family History  Problem Relation Age of Onset  . Diabetes Son   . Hypertension Paternal Grandfather   . Prostate cancer Neg Hx   . Bladder Cancer Neg Hx   . Kidney cancer Neg Hx     Social History:  reports that he has never smoked. He has never used smokeless tobacco. He reports that he drinks alcohol. His drug history is not on file.  ROS: UROLOGY Frequent Urination?: Yes Hard to postpone urination?: Yes Burning/pain with urination?: No Get up at night to urinate?: Yes Leakage of urine?: Yes Urine stream starts and stops?: No Trouble starting stream?: Yes Do you have to strain to urinate?: Yes Blood in urine?: No Urinary tract infection?: No Sexually transmitted disease?: No Injury to kidneys or bladder?: No Painful intercourse?: No Weak stream?: No Erection problems?: No Penile pain?: No  Gastrointestinal Nausea?: No Vomiting?: No Indigestion/heartburn?: No Diarrhea?: No Constipation?: No  Constitutional Fever: No Night sweats?: No Weight loss?: No Fatigue?: No  Skin Skin rash/lesions?: No Itching?: No  Eyes Blurred vision?: Yes Double vision?: Yes  Ears/Nose/Throat Sore throat?:  No Sinus problems?: Yes  Hematologic/Lymphatic Swollen glands?: No Easy bruising?: No  Cardiovascular Leg swelling?: No Chest pain?: No  Respiratory Cough?: Yes Shortness of breath?: No  Endocrine Excessive thirst?: No  Musculoskeletal Back pain?: No Joint pain?: No  Neurological Headaches?: No Dizziness?: No  Psychologic Depression?: No Anxiety?: No  Physical Exam: BP (!) 95/54 (BP Location: Left Arm, Patient Position: Sitting, Cuff Size: Normal)   Pulse 82   Ht 6' 2.5" (1.892 m)   Wt 175 lb 11.2 oz (79.7 kg)   BMI 22.26 kg/m   Constitutional:  Alert and oriented, No acute distress.  Elderly, ambulating with walker. Unaccompanied today. HEENT: [place]  AT, moist mucus membranes.  Trachea midline, no masses. Cardiovascular: No clubbing, cyanosis, or edema. Respiratory: Normal respiratory effort, no increased work of breathing. GI: Abdomen is soft, nontender, nondistended, no abdominal masses GU: No CVA tenderness. Normal circumcised phallus with bilateral descended testicles. Skin: No rashes, bruises or suspicious lesions. Neurologic: Grossly intact, no focal deficits, moving all 4 extremities. Psychiatric: Normal mood and affect.  Laboratory Data: Lab Results  Component Value Date   WBC 6.5 10/03/2016   HGB 11.7 (L) 10/03/2016   HCT 33.8 (L) 10/03/2016   MCV 86.1 10/03/2016   PLT 137 (L) 10/03/2016    Lab Results  Component Value Date   CREATININE 1.06 10/03/2016    Lab Results  Component Value Date   PSA 3.98 07/02/2016   PSA 2.23 04/04/2016   PSA 2.35 01/03/2016    Lab Results  Component Value Date   TESTOSTERONE <3 (L) 10/03/2016   Urinalysis    Component Value Date/Time   COLORURINE YELLOW (A) 10/03/2016 1531   APPEARANCEUR CLEAR (A) 10/03/2016 1531   LABSPEC 1.008 10/03/2016 1531   PHURINE 5.0 10/03/2016 1531   GLUCOSEU NEGATIVE 10/03/2016 1531   HGBUR NEGATIVE 10/03/2016 1531   BILIRUBINUR NEGATIVE 10/03/2016 1531   KETONESUR  NEGATIVE 10/03/2016 1531   PROTEINUR NEGATIVE 10/03/2016 1531   NITRITE NEGATIVE 10/03/2016 1531   LEUKOCYTESUR NEGATIVE 10/03/2016 1531    Pertinent Imaging: CLINICAL DATA:  History of prostate cancer. Generalized abdominal pain.  EXAM: CT ABDOMEN AND PELVIS WITH CONTRAST  TECHNIQUE: Multidetector CT imaging of the abdomen and pelvis was performed using the standard protocol following bolus administration of intravenous contrast.  CONTRAST:  124mL ISOVUE-300 IOPAMIDOL (ISOVUE-300) INJECTION 61%  COMPARISON:  03/05/2010.  FINDINGS: Lower chest: No acute abnormality.  Hepatobiliary: Mild diffuse hepatic steatosis identified. The gallbladder appears within normal limits. No biliary dilatation identified.  Pancreas: The pancreas is normal.  Spleen: Normal in size without focal abnormality.  Adrenals/Urinary Tract: The adrenal glands are normal. Bilateral kidney cysts are identified. There is a hyperdense lesion arising from the lateral cortex of the left mid kidney measuring 1.3 cm, image 33 of series 2. This may represent a hemorrhagic/ proteinaceous cyst or solid enhancing kidney lesion. New left-sided hydronephrosis and hydroureter is identified to the level of the UVJ. Here, there is a soft tissue lesion which measures 1.9 by 1.7 cm, image 74 of series 2.  Stomach/Bowel: The stomach appears normal. The small bowel loops have a normal course and caliber. The appendix is visualized and appears normal. No pathologic dilatation of the colon. Postsurgical changes involving the sigmoid colon noted.  Vascular/Lymphatic: Aortic atherosclerosis. No aneurysm. There is a single left periaortic lymph node which is new from previous exam measuring 9 mm, image 38 of series 2. Left common iliac lymph node measures 9 mm. Previous pelvic lymph node dissection.  Reproductive: Prostatectomy has been performed.  Other: No free fluid or fluid collections within the  abdomen or pelvis.  Musculoskeletal: Degenerative disc disease identified within the lumbar spine.  IMPRESSION: 1. Left-sided hydronephrosis. There is a enhancing lesion at the left UPJ measuring 1.9 cm and suspicious for a urothelial carcinoma. 2. Indeterminate hyperdense lesion arises from the lateral cortex of the left kidney. This may represent a hemorrhagic cyst or solid enhancing kidney mass. Further investigation with kidney protocol MRI is advised. 3.  Aortic Atherosclerosis (ICD10-I70.0). 4. Hepatic steatosis.   Electronically Signed   By: Kerby Moors M.D.   On: 10/06/2016 14:48  Cystoscopy Procedure Note  Patient identification was confirmed, informed consent was obtained, and patient was prepped using Betadine solution.  Lidocaine jelly was administered per urethral meatus.    Preoperative abx where received prior to procedure.     Pre-Procedure: - Inspection reveals a normal caliber ureteral meatus.  Procedure: The flexible cystoscope was introduced without difficulty - No urethral strictures/lesions are present. - Surgically absent prostate  - Normal bladder neck - Bilateral ureteral orifices identified, left UO did seem somewhat displaced with mass effect behind the trigone - Bladder mucosa  reveals no ulcers, tumors, or lesions - No bladder stones - Milderate trabeculation  Retroflexion as above, normal bladder neck with slight asymmetry of the left trigone with mass effect into the bladder but normal overlying urothelium   Post-Procedure: - Patient tolerated the procedure well  Assessment/ Plan:  CT scan personally reviewed today. This is also discussed personally with Dr. Clovis Riley.  Assessment & Plan:    1. Bladder mass Lesion which appears to be in the bladder on CT scan is in fact either posterior to the bladder with mass effect (recurrent prsotate cancer) or possibly within the left distal ureter.    Options including CT urogram versus  direct visualization, possible biopsy, and left ureteral stent were reviewed. Given my concern for nondiagnostic CT scan and lack of ability to intervene/obtain tissue, I would prefer to proceed to the operating room. Patient is agreeable to this plan. I also discussed this with his daughter is also agreeable.  Plan for cystoscopy, bilateral retrograde pyelogram, left ureteroscopy, possible left ureteral biopsy, left ureteral stent placement. Risks and benefits were discussed in detail. All questions were answered. He may continue his baby aspirin.  2. Hydronephrosis of left kidney Hydroureteronephrosis down to the level of the UVJ secondary to obstructing mass, either posterior to the bladder in the left distal ureter. Diagnostic workup as above.  3. Prostate cancer ([place]) Status post prostatectomy 1999 with biochemical recurrence on intermittent ADT, resumed 1 month ago in the setting of rising PSA.   Hollice Espy, MD  [facility] [address] [place], [place] [address] [phone]  Case was reviewed with Dr. Rogue Bussing. I also personally discussed the case by telephone today with the patient's daughter, Baxter Flattery [phone]).  I spent 60 min with this patient of which greater than 50% was spent in counseling and coordination of care with the patient.

## 2016-11-19 NOTE — Discharge Instructions (Signed)
° ° ° ° ° ° ° ° ° ° ° ° ° ° ° °  AMBULATORY SURGERY  DISCHARGE INSTRUCTIONS   1) The drugs that you were given will stay in your system until tomorrow so for the next 24 hours you should not:  A) Drive an automobile B) Make any legal decisions C) Drink any alcoholic beverage   2) You may resume regular meals tomorrow.  Today it is better to start with liquids and gradually work up to solid foods.  You may eat anything you prefer, but it is better to start with liquids, then soup and crackers, and gradually work up to solid foods.   3) Please notify your doctor immediately if you have any unusual bleeding, trouble breathing, redness and pain at the surgery site, drainage, fever, or pain not relieved by medication.    4) Additional Instructions:        Please contact your physician with any problems or Same Day Surgery at [phone], Monday through Friday 6 am to 4 pm, or [place] [facility] [place] Main number at [phone].You have a ureteral stent in place.  This is a tube that extends from your kidney to your bladder.  This may cause urinary bleeding, burning with urination, and urinary frequency.  Please call our office or present to the ED if you develop fevers >101 or pain which is not able to be controlled with oral pain medications.  You may be given either Flomax and/ or ditropan to help with bladder spasms and stent pain in addition to pain medications.    [facility] [address], [address] [place], [place] [address] [phone]  

## 2016-11-19 NOTE — Anesthesia Preprocedure Evaluation (Addendum)
Anesthesia Evaluation  Patient identified by MRN, date of birth, ID band Patient awake    Reviewed: Allergy & Precautions, H&P , NPO status , Patient's Chart, lab work & pertinent test results  History of Anesthesia Complications Negative for: history of anesthetic complications  Airway Mallampati: III  TM Distance: >3 FB Neck ROM: limited    Dental  (+) Poor Dentition, Chipped, Missing, Caps, Implants   Pulmonary neg pulmonary ROS, neg shortness of breath,           Cardiovascular Exercise Tolerance: Good (-) angina+ Peripheral Vascular Disease  (-) Past MI + dysrhythmias      Neuro/Psych  Neuromuscular disease CVA negative psych ROS   GI/Hepatic negative GI ROS, Neg liver ROS,   Endo/Other  negative endocrine ROS  Renal/GU Renal disease     Musculoskeletal  (+) Arthritis ,   Abdominal   Peds  Hematology negative hematology ROS (+)   Anesthesia Other Findings Past Medical History: No date: Aortic atherosclerosis ([facility])     Comment:  Noted on CT No date: Arthritis No date: Colon polyps     Comment:  adenomatous No date: Dysrhythmia     Comment:  pt unaware of this No date: History of fracture of tibia No date: History of tachycardia     Comment:  patient unaware of this No date: Kyphosis of cervicothoracic region     Comment:  SEVERE 01/2014: Osteoporosis     Comment:  T -3.8 (01/2014) No date: Other constipation     Comment:  chronic No date: Prostate cancer ([place])     Comment:  surgery then lupron No date: Unspecified venous (peripheral) insufficiency No date: Vitamin B12 deficiency  Past Surgical History: 2004: BASAL CELL CARCINOMA EXCISION     Comment:  Left side of face 2000, 2003: CATARACT EXTRACTION; Bilateral     Comment:  both eyes No date: COLONOSCOPY     Comment:  h/o adenomatous polyps 2005: JOINT REPLACEMENT     Comment:  Left knee 12/13: JOINT REPLACEMENT     Comment:  Right  knee 1996: PROSTATE SURGERY     Comment:  cancer No date: sigmoid resection 1999: TIBIA FRACTURE SURGERY 12/10: VOLVULUS REDUCTION  BMI    Body Mass Index:  22.57 kg/m      Reproductive/Obstetrics negative OB ROS                            Anesthesia Physical Anesthesia Plan  ASA: III  Anesthesia Plan: General LMA   Post-op Pain Management:    Induction: Intravenous  PONV Risk Score and Plan: 3 and Ondansetron, Dexamethasone and Treatment may vary due to age or medical condition  Airway Management Planned: LMA  Additional Equipment:   Intra-op Plan:   Post-operative Plan: Extubation in OR  Informed Consent: I have reviewed the patients History and Physical, chart, labs and discussed the procedure including the risks, benefits and alternatives for the proposed anesthesia with the patient or authorized representative who has indicated his/her understanding and acceptance.   Dental Advisory Given  Plan Discussed with: Anesthesiologist, CRNA and Surgeon  Anesthesia Plan Comments: (DNR suspended by patient and daughter for the procedure.  Spinal offered, but since one of the patients and daughters goals is to get him home earlier today they have elected for general anesthesia.  Patient and family informed that patient is higher risk for complications from anesthesia during this procedure due to their medical history and age  including but not limited to post operative cognitive dysfunction.  They voiced understanding.  Patient consented for risks of anesthesia including but not limited to:  - adverse reactions to medications - damage to teeth, lips or other oral mucosa - sore throat or hoarseness - Damage to heart, brain, lungs or loss of life  Patient voiced understanding.)       Anesthesia Quick Evaluation

## 2016-11-19 NOTE — Op Note (Signed)
Date of procedure: 11/19/16  Preoperative diagnosis:  1. Left hydroureteronephrosis   Postoperative diagnosis:  1. Same as above  2. Penile adhesions  Procedure: 1. Left retrograde pyelogram 2. Left ureteroscopy 3. Left ureteral stent placement 4. Lysis of penile adhesions  Surgeon: Hollice Espy, MD  Anesthesia: General  Complications: None  Intraoperative findings: Apple core type filling defect involving the last 2 cm of the left distal ureter. No intraluminal tumor identified, consistent with extrinsic compression.  High-grade ureteral obstruction with a secondary UPJ obstruction secondary to tortuosity of the ureter.   Stent placed with some difficulty, unclear whether proximal coil in ureter versus renal pelvis.  EBL: Minimal  Specimens: None  Drains: 6 x 26 French double-J ureteral stent, Bard Optima (left)  Indication: Benjamin Villarreal is a 83 y.o. patient with hydroureteronephrosis on the left secondary to intraluminal versus extrinsic compression. .  After reviewing the management options for treatment, he elected to proceed with the above surgical procedure(s). We have discussed the potential benefits and risks of the procedure, side effects of the proposed treatment, the likelihood of the patient achieving the goals of the procedure, and any potential problems that might occur during the procedure or recuperation. Informed consent has been obtained.  Description of procedure:  The patient was taken to the operating room and general anesthesia was induced.  The patient was placed in the dorsal lithotomy position, prepped and draped in the usual sterile fashion, and preoperative antibiotics were administered. A preoperative time-out was performed.   At this point time, the patient was noted to have penile adhesions with redundant foreskin adhesed to the coronal margin. These were taken down bluntly and smegma was cleaned. Additional Betadine solution was used.  At the end of  the procedure, bacitracin ointment was used  A 21 French cystoscope was advanced per urethra into the bladder. Ureteral orifice had a heaped up appearance. Wire was placed just within the UO in order to help accommodate a 5 [place] open-ended ureteral catheter which was placed just at the mouth. A gentle retrograde pyelogram was then performed which revealed a filling defect involving the left distal ureter, proximally 2 cm which had an output core type filling defect. There was abrupt change just proximal to this with severe hydroureteronephrosis. Despite copious amounts of contrast solution, I had difficulty filling the entire ureter and collecting system with contrast due to its capacious nature. The ureter was extremely dilated and tortuous. There appeared to be possibly secondary UPJ obstruction due to this tortuosity. I then attempted to advance a wire up to level of the renal pelvis at some difficulty again and the proximal ureter due to tortuosity.  Several different wires including a sensor wire, an angled Glidewire, and open-ended ureteral catheters in order to attempt to get the wire up to the level of the renal pelvis.  The wire and did ultimately advanced to what was felt to be the renal pelvis.  A 4.5 semirigid ureteroscope was advanced to the distal ureter using a railroad technique into the distal ureter. No tumor was identified within the ureter itself and appeared to be extrinsic compression from the pelvic tumor other than an intraluminal obstruction.  A second retrograde pyelogram was performed at the level of the mid ureter and at this time, there is a very mild amount of contrast extravasation at the level of the proximal ureter which appeared to be tracking along the psoas. There is also some concern that perhaps the wire was not in fact  in optimal position.  I attempted to advance a second wire to the same level was unsuccessful. Ultimately, I advanced the Super Stiff wire to the level of  the proximal ureter just distal to what appeared to be a switch back, 180 folds in the ureter.   A flexible ureteroscope was advanced up to this level. Upon advancing the scope, the ureter did appear to straighten somewhat the stomach, of contrast within the ureter. At the level of the UPJ, the wire could be seen traversing what appeared to be a fairly tight ureteral fold felt to most likely be a secondary UPJ obstruction from tortuosity. Additional contrast was injected and there was no obvious extravasation noted. There is still some concern that this was not indeed within the collecting system and prior to stent placement, it was seemed important to ensure adequate position.  Despite multiple attempts, I was unable to get a second wire beyond this level. Finally, an open-ended ureteral catheter was advanced over the safety wire up to level of what appeared to be the renal pelvis. Contrast was injected which did remain within the collecting system confirming its position within the renal pelvis all along. The wire was then replaced and the open-ended ureteral catheter was removed. This is then backloaded over a rigid cystoscope. A 6 x 26 French double-J ureteral stent was then advanced over the wire up to level of the renal pelvis. Unfortunately, upon withdrawing the wire, it appeared that the proximal coil backed down possibly within the proximal ureter and is unclear whether the proximal loop was indeed within a capacious renal pelvis and proximal ureter at this point in time. The wire simply drawn and a coil was noted within the bladder. I then attempted then to do retrograde along side of the stent using a Kumpe catheter but contrast did not make it past the mid ureter due to its capacious nature. After several minutes, did appear that the kidney drained quite briskly such that the entire collecting system was drained. This was reassuring.  At this point time, the procedure was deemed complete. The patient  was then cleaned and dried, repositioned the supine position, reversed from anesthesia, taken the PACU in stable condition.   Plan: Intraoperative findings were discussed with the patient's daughter at length. A message was sent to his oncologist discussing the findings. I would like the patient to follow-up in 2 weeks with a noncontrast CT scan prior to confirm the position of the ureteral stent and assess for ongoing hydronephrosis. If the stent isn't an adequate position and his hydronephrosis is not improved, may consider percutaneous nephrostomy tube.   Hollice Espy, M.D.

## 2016-11-19 NOTE — Transfer of Care (Signed)
Immediate Anesthesia Transfer of Care Note  Patient: Benjamin Villarreal  Procedure(s) Performed: Procedure(s) with comments: CYSTOSCOPY WITH RETROGRADE PYELOGRAM (Bilateral) - General vs. Spinal URETERAL BIOPSY (Left) CYSTOSCOPY WITH URETEROSCOPY AND STENT PLACEMENT (Left)  Patient Location: PACU  Anesthesia Type:General  Level of Consciousness: sedated  Airway & Oxygen Therapy: Patient Spontanous Breathing and Patient connected to face mask oxygen  Post-op Assessment: Report given to RN and Post -op Vital signs reviewed and stable  Post vital signs: Reviewed and stable  Last Vitals:  Vitals:   11/19/16 0943 11/19/16 1414  BP: 125/66 (!) 142/64  Pulse: 77 (!) 58  Resp: 16 20  Temp: (!) 36.2 C 36.6 C  SpO2: 98% 100%    Last Pain:  Vitals:   11/19/16 0943  TempSrc: Oral  PainSc: 0-No pain      Patients Stated Pain Goal: 0 (23/95/32 0233)  Complications: No apparent anesthesia complications

## 2016-11-19 NOTE — Anesthesia Postprocedure Evaluation (Signed)
Anesthesia Post Note  Patient: Cristin Szatkowski  Procedure(s) Performed: Procedure(s) (LRB): CYSTOSCOPY WITH RETROGRADE PYELOGRAM (Bilateral) URETERAL BIOPSY (Left) CYSTOSCOPY WITH URETEROSCOPY AND STENT PLACEMENT (Left)  Patient location during evaluation: PACU Anesthesia Type: General Level of consciousness: awake Pain management: pain level controlled Vital Signs Assessment: post-procedure vital signs reviewed and stable Respiratory status: spontaneous breathing Cardiovascular status: stable Anesthetic complications: no     Last Vitals:  Vitals:   11/19/16 0943 11/19/16 1414  BP: 125/66 (!) 142/64  Pulse: 77 (!) 58  Resp: 16 20  Temp: (!) 36.2 C 36.6 C  SpO2: 98% 100%    Last Pain:  Vitals:   11/19/16 1414  TempSrc:   PainSc: Asleep                 VAN STAVEREN,Maribella Kuna

## 2016-11-19 NOTE — Anesthesia Post-op Follow-up Note (Signed)
Anesthesia QCDR form completed.        

## 2016-11-19 NOTE — Anesthesia Procedure Notes (Signed)
Procedure Name: LMA Insertion Date/Time: 11/19/2016 12:35 PM Performed by: Dionne Bucy Pre-anesthesia Checklist: Patient identified, Patient being monitored, Timeout performed, Emergency Drugs available and Suction available Patient Re-evaluated:Patient Re-evaluated prior to induction Oxygen Delivery Method: Circle system utilized Preoxygenation: Pre-oxygenation with 100% oxygen Induction Type: IV induction Ventilation: Mask ventilation without difficulty LMA: LMA inserted LMA Size: 4.0 Tube type: Oral Number of attempts: 1 Placement Confirmation: positive ETCO2 and breath sounds checked- equal and bilateral Tube secured with: Tape Dental Injury: Teeth and Oropharynx as per pre-operative assessment

## 2016-11-19 NOTE — Telephone Encounter (Signed)
Just got your message this was not in the chart when post op called all is good. You can disregard Angie's earlier message. App made Thanks,  Sharyn Lull

## 2016-11-19 NOTE — Interval H&P Note (Signed)
History and Physical Interval Note:  11/19/2016 11:54 AM  Benjamin Villarreal  has presented today for surgery, with the diagnosis of Left ureteral lesion  The various methods of treatment have been discussed with the patient and family. After consideration of risks, benefits and other options for treatment, the patient has consented to  Procedure(s) with comments: CYSTOSCOPY WITH RETROGRADE PYELOGRAM (Bilateral) - General vs. Spinal URETERAL BIOPSY (Left) CYSTOSCOPY WITH URETEROSCOPY AND STENT PLACEMENT (Left) as a surgical intervention .  The patient's history has been reviewed, patient examined, no change in status, stable for surgery.  I have reviewed the patient's chart and labs.  Questions were answered to the patient's satisfaction.    RRR CTAB  Hollice Espy

## 2016-11-19 NOTE — Telephone Encounter (Signed)
Received call from post op that this Pt needed a 2 week follow up with CT prior. You are booked at 100% at 2 weeks, so I scheduled him at 3 weeks. Sharyn Lull has to get CT approved and scheduled. Please confirm if this is what he needs, and this is okay. Thanks.  Angie

## 2016-11-20 ENCOUNTER — Telehealth: Payer: Self-pay | Admitting: Internal Medicine

## 2016-11-20 ENCOUNTER — Encounter: Payer: Self-pay | Admitting: Urology

## 2016-11-20 NOTE — Telephone Encounter (Signed)
Please inform patient that as per recommendations of Dr. Smiley Houseman will plan to re-start Lupron.   I would like to see the patient on October 3 at 8:45; and also scheduled for Lupron on the day.

## 2016-11-21 NOTE — Telephone Encounter (Signed)
Benjamin Villarreal, Please schedule apts per md order below. I will call patient

## 2016-11-21 NOTE — Telephone Encounter (Signed)
Detailed vm left for patient regarding plan of care. I asked that pt call our office back to confirm that he received this vm.

## 2016-11-26 ENCOUNTER — Inpatient Hospital Stay: Payer: Medicare Other | Admitting: Internal Medicine

## 2016-11-26 ENCOUNTER — Inpatient Hospital Stay: Payer: Medicare Other

## 2016-11-26 NOTE — Progress Notes (Deleted)
[facility] OFFICE PROGRESS NOTE  Patient Care Team: Venia Carbon, MD as PCP - General   SUMMARY OF ONCOLOGIC HISTORY: Oncology History    # 1996 PROSTATE CANCER [Gleason score 2+3] s/p Radical Prostatectomy [[facility]]; ?Biochem RECURRENT PROSTATE CANCER-Hormone sensitive;  Intermittent Lupron; on Lupron q 65M [Jan 2016 PSA- 7.9].  HOLD LUPRON in MAY 2017; AUg 2017- Re-start  # Osteoporosis [BMD- 2015]- on Reclast q 56M; FEB 2017- Start Prolia q 6 M     Prostate cancer ([name])   03/22/2009 Initial Diagnosis    Prostate cancer ([place])      INTERVAL HISTORY:  A very pleasant 83 year old male patient with above history of  Castrate resistant recurrent prostate cancer- on Lupron was evaluated by urology for his hydronephrosis. He is awaiting a repeat cystoscopy/biopsy/possible stent placement in the OR in the next few weeks.   Patient denies any more abdominal pain. Admits to black loose stools since he is on iron pills. Otherwise no blood. Denies any abdominal discomfort while on iron pills. Appetite is good. No weight loss. No hot flashes. He feels fairly well for his age. Denies any difficulty urination. No jaw pain.  REVIEW OF SYSTEMS:  A complete 10 point review of system is done which is negative except mentioned above/history of present illness.   PAST MEDICAL HISTORY :  Past Medical History:  Diagnosis Date  . Aortic atherosclerosis ([place])    Noted on CT  . Arthritis   . Colon polyps    adenomatous  . Dysrhythmia    pt unaware of this  . History of fracture of tibia   . History of tachycardia    patient unaware of this  . Kyphosis of cervicothoracic region    SEVERE  . Osteoporosis 01/2014   T -3.8 (01/2014)  . Other constipation    chronic  . Prostate cancer ([name])    surgery then lupron  . Unspecified venous (peripheral) insufficiency   . Vitamin B12 deficiency     PAST SURGICAL HISTORY :   Past Surgical History:  Procedure Laterality Date  . BASAL  CELL CARCINOMA EXCISION  2004   Left side of face  . CATARACT EXTRACTION Bilateral 2000, 2003   both eyes  . COLONOSCOPY     h/o adenomatous polyps  . CYSTOSCOPY W/ RETROGRADES Bilateral 11/19/2016   Procedure: CYSTOSCOPY WITH RETROGRADE PYELOGRAM;  Surgeon: Hollice Espy, MD;  Location: [facility] ORS;  Service: Urology;  Laterality: Bilateral;  General vs. Spinal  . CYSTOSCOPY WITH URETEROSCOPY AND STENT PLACEMENT Left 11/19/2016   Procedure: CYSTOSCOPY WITH URETEROSCOPY AND STENT PLACEMENT;  Surgeon: Hollice Espy, MD;  Location: [facility] ORS;  Service: Urology;  Laterality: Left;  . JOINT REPLACEMENT  2005   Left knee  . JOINT REPLACEMENT  12/13   Right knee  . PROSTATE SURGERY  1996   cancer  . sigmoid resection    . TIBIA FRACTURE SURGERY  1999  . URETERAL BIOPSY Left 11/19/2016   Procedure: URETERAL BIOPSY;  Surgeon: Hollice Espy, MD;  Location: [facility] ORS;  Service: Urology;  Laterality: Left;  Marland Kitchen VOLVULUS REDUCTION  12/10    FAMILY HISTORY :   Family History  Problem Relation Age of Onset  . Diabetes Son   . Hypertension Paternal Grandfather   . Prostate cancer Neg Hx   . Bladder Cancer Neg Hx   . Kidney cancer Neg Hx     SOCIAL HISTORY:   Social History  Substance Use Topics  . Smoking status:  Never Smoker  . Smokeless tobacco: Never Used  . Alcohol use 0.0 - 0.6 oz/week     Comment: in past    ALLERGIES:  is allergic to other.  MEDICATIONS:  Current Outpatient Prescriptions  Medication Sig Dispense Refill  . aspirin 81 MG EC tablet Take 81 mg by mouth every other day.     . Biotin 2.5 MG CAPS Take by mouth.    . bismuth subsalicylate (PEPTO BISMOL) 262 MG/15ML suspension Take 10 mLs by mouth 4 (four) times daily -  before meals and at bedtime. Takes 2 1/2 times a day    . Calcium Carbonate-Vitamin D (CALCIUM-D) 600-400 MG-UNIT TABS Take 1 tablet by mouth daily.     . carbamide peroxide (DEBROX) 6.5 % OTIC solution 5 drops as needed (ear wax impaction).    .  cholecalciferol (VITAMIN D) 1000 UNITS tablet Take 1,000 Units by mouth 2 (two) times daily.     . clindamycin (CLEOCIN) 150 MG capsule Take 150 mg by mouth. Take 4 capsules prior to dental work    . cyclobenzaprine (FLEXERIL) 5 MG tablet Take 5 mg by mouth at bedtime as needed for muscle spasms.    . diphenhydrAMINE (BENADRYL) 25 mg capsule Take 25 mg by mouth every 4 (four) hours as needed for itching or allergies.    . fluticasone (FLONASE) 50 MCG/ACT nasal spray USE 2 SPRAYS IN EACH NOSTRIL DAILY 48 g 3  . HYDROcodone-acetaminophen (NORCO/VICODIN) 5-325 MG tablet Take 1-2 tablets by mouth every 6 (six) hours as needed for moderate pain. 10 tablet 0  . ibuprofen (ADVIL,MOTRIN) 200 MG tablet Take 400 mg by mouth daily as needed.    . latanoprost (XALATAN) 0.005 % ophthalmic solution Place 1 drop into both eyes at bedtime.    Marland Kitchen leuprolide (LUPRON) 30 MG injection Inject 30 mg into the muscle every 4 (four) months. Dose and interval not clear yet    . loratadine (CLARITIN) 10 MG tablet TAKE 1 TABLET DAILY (Patient taking differently: 2 tabs hs) 90 tablet 3  . nystatin (NYSTATIN) powder Apply topically 2 (two) times daily as needed (to groin and abd fold areas as needed for rash).    . polyethylene glycol powder (GLYCOLAX/MIRALAX) powder FILL TO LINE (75 GRAMS ), MIX AND DRINK TWICE A DAY AND 8.5 GRAMS AT NOON 510 g 3  . sennosides-docusate sodium (SENOKOT-S) 8.6-50 MG tablet Take 4 tablets by mouth daily.     . Skin Protectants, Misc. (EUCERIN) cream Apply 1 application topically as needed for dry skin.    Marland Kitchen traMADol (ULTRAM) 50 MG tablet Take 1 tablet (50 mg total) by mouth 3 (three) times daily as needed. (Patient taking differently: Take 50 mg by mouth every 6 (six) hours as needed. ) 270 tablet 0  . triamcinolone cream (KENALOG) 0.1 % Apply 1 application topically every other day. Alternating with Ciclopirox    . vitamin B-12 (CYANOCOBALAMIN) 1000 MCG tablet Take 1 tablet (1,000 mcg total) by  mouth daily. 90 tablet 3  . vitamin C (ASCORBIC ACID) 500 MG tablet Take 500 mg by mouth daily.    . zoledronic acid (RECLAST) 5 MG/100ML SOLN injection Inject 5 mg into the vein every 6 (six) months.      No current facility-administered medications for this visit.    Facility-Administered Medications Ordered in Other Visits  Medication Dose Route Frequency Provider Last Rate Last Dose  . leuprolide (LUPRON) injection 30 mg  30 mg Intramuscular Once Leia Alf, MD  PHYSICAL EXAMINATION: ECOG PERFORMANCE STATUS: 1 - Symptomatic but completely ambulatory  There were no vitals taken for this visit.  There were no vitals filed for this visit.  GENERAL: Well-nourished well-developed; Alert, no distress and comfortable.  Alone; walks with a rolling walker EYES: no pallor or icterus OROPHARYNX: no thrush or ulceration; good dentition  NECK: supple, no masses felt LYMPH:  no palpable lymphadenopathy in the cervical, axillary or inguinal regions LUNGS: clear to auscultation and  No wheeze or crackles HEART/CVS: regular rate & rhythm and no murmurs; No lower extremity edema ABDOMEN:abdomen soft, non-tender and normal bowel sounds Musculoskeletal:no cyanosis of digits and no clubbing  PSYCH: alert & oriented x 3 with fluent speech NEURO: no focal motor/sensory deficits SKIN:  no rashes or significant lesions  LABORATORY DATA:  I have reviewed the data as listed    Component Value Date/Time   NA 136 10/03/2016 1341   NA 140 02/11/2012 0415   K 4.1 10/03/2016 1341   K 3.9 02/11/2012 0415   CL 105 10/03/2016 1341   CL 108 (H) 02/11/2012 0415   CO2 21 (L) 10/03/2016 1341   CO2 25 02/11/2012 0415   GLUCOSE 189 (H) 10/03/2016 1341   GLUCOSE 125 (H) 02/11/2012 0415   BUN 21 (H) 10/03/2016 1341   BUN 12 02/11/2012 0415   CREATININE 1.06 10/03/2016 1341   CREATININE 1.00 03/23/2014 1400   CALCIUM 8.8 (L) 10/03/2016 1341   CALCIUM 8.3 (L) 03/23/2014 1400   PROT 7.7  07/02/2016 1344   PROT 7.0 03/07/2014 1414   ALBUMIN 3.9 07/02/2016 1344   ALBUMIN 3.7 03/07/2014 1414   AST 24 07/02/2016 1344   AST 16 03/07/2014 1414   ALT 12 (L) 07/02/2016 1344   ALT 19 03/07/2014 1414   ALKPHOS 63 07/02/2016 1344   ALKPHOS 69 03/07/2014 1414   BILITOT 0.5 07/02/2016 1344   BILITOT 0.3 03/07/2014 1414   GFRNONAA 58 (L) 10/03/2016 1341   GFRNONAA >60 03/23/2014 1400   GFRNONAA 49 (L) 09/02/2013 1428   GFRAA >60 10/03/2016 1341   GFRAA >60 03/23/2014 1400   GFRAA 57 (L) 09/02/2013 1428    No results found for: SPEP, UPEP  Lab Results  Component Value Date   WBC 5.5 11/03/2016   NEUTROABS 2.9 11/03/2016   HGB 12.0 (L) 11/03/2016   HCT 34.7 (L) 11/03/2016   MCV 86.3 11/03/2016   PLT 130 (L) 11/03/2016      Chemistry      Component Value Date/Time   NA 136 10/03/2016 1341   NA 140 02/11/2012 0415   K 4.1 10/03/2016 1341   K 3.9 02/11/2012 0415   CL 105 10/03/2016 1341   CL 108 (H) 02/11/2012 0415   CO2 21 (L) 10/03/2016 1341   CO2 25 02/11/2012 0415   BUN 21 (H) 10/03/2016 1341   BUN 12 02/11/2012 0415   CREATININE 1.06 10/03/2016 1341   CREATININE 1.00 03/23/2014 1400   GLU 114 06/30/2016      Component Value Date/Time   CALCIUM 8.8 (L) 10/03/2016 1341   CALCIUM 8.3 (L) 03/23/2014 1400   ALKPHOS 63 07/02/2016 1344   ALKPHOS 69 03/07/2014 1414   AST 24 07/02/2016 1344   AST 16 03/07/2014 1414   ALT 12 (L) 07/02/2016 1344   ALT 19 03/07/2014 1414   BILITOT 0.5 07/02/2016 1344   BILITOT 0.3 03/07/2014 1414     Results for DEONTEZ, KLINKE (MRN [id number]) as of 11/03/2016 10:44  Ref. Range 10/03/2015 10:22 01/03/2016  09:50 04/04/2016 09:56 07/02/2016 13:44 10/03/2016 13:41  PSA Latest Ref Range: 0.00 - 4.00 ng/mL 2.19 2.35 2.23 3.98   Prostatic Specific Antigen Latest Ref Range: 0.00 - 4.00 ng/mL     5.37 (H)         ASSESSMENT & PLAN:   No problem-specific Assessment & Plan notes found for this encounter.     Cammie Sickle,  MD 11/26/2016 8:12 AM

## 2016-11-26 NOTE — Assessment & Plan Note (Deleted)
# #  CASTRATE RESISTANT PROSTATE CANCER- Biochemical recurrence versus metastatic disease. PSA aug 10th 2018- 5.37. Order bone scan before the next visit.  Ee-started on Lupron 22.5 on 8/16 /2018.   # Left hydronephrosis/? Bladder mass at UPJ- s/p  Dr.Brandon; Urology; awaiting OR possible stent plancement. Reviewed the note from urology.  # Osteoporosis- Reviewed the bone density from 2015 - severe osteoporosis. on Prolia q 6 M- last 10/03/2016   # Iron deficiency anemia- question related to urinary blood loss. By mouth iron tolerating well. Hemoglobin 12. Hold off any IV iron at this time.  # follow up in 6 weeks; cbc/cmp/psa; bone scan prior.

## 2016-11-27 NOTE — Telephone Encounter (Signed)
Patient canceled appt on 11/26/16 for Lab/MD/Lupron.  Message sent to [facility] to schedule patient for Lab/MD/Lupron next week.

## 2016-12-01 ENCOUNTER — Inpatient Hospital Stay (HOSPITAL_BASED_OUTPATIENT_CLINIC_OR_DEPARTMENT_OTHER): Payer: Medicare Other | Admitting: Internal Medicine

## 2016-12-01 ENCOUNTER — Inpatient Hospital Stay: Payer: Medicare Other | Attending: Internal Medicine

## 2016-12-01 ENCOUNTER — Inpatient Hospital Stay: Payer: Medicare Other

## 2016-12-01 VITALS — BP 143/75 | HR 68 | Temp 97.6°F | Resp 20 | Ht 74.25 in | Wt 177.8 lb

## 2016-12-01 DIAGNOSIS — Z7982 Long term (current) use of aspirin: Secondary | ICD-10-CM | POA: Insufficient documentation

## 2016-12-01 DIAGNOSIS — M40203 Unspecified kyphosis, cervicothoracic region: Secondary | ICD-10-CM

## 2016-12-01 DIAGNOSIS — Z79899 Other long term (current) drug therapy: Secondary | ICD-10-CM | POA: Diagnosis not present

## 2016-12-01 DIAGNOSIS — Z85828 Personal history of other malignant neoplasm of skin: Secondary | ICD-10-CM

## 2016-12-01 DIAGNOSIS — K5900 Constipation, unspecified: Secondary | ICD-10-CM | POA: Diagnosis not present

## 2016-12-01 DIAGNOSIS — D509 Iron deficiency anemia, unspecified: Secondary | ICD-10-CM | POA: Diagnosis not present

## 2016-12-01 DIAGNOSIS — I700 Atherosclerosis of aorta: Secondary | ICD-10-CM | POA: Diagnosis not present

## 2016-12-01 DIAGNOSIS — E538 Deficiency of other specified B group vitamins: Secondary | ICD-10-CM | POA: Diagnosis not present

## 2016-12-01 DIAGNOSIS — Z86010 Personal history of colonic polyps: Secondary | ICD-10-CM

## 2016-12-01 DIAGNOSIS — C61 Malignant neoplasm of prostate: Secondary | ICD-10-CM | POA: Diagnosis not present

## 2016-12-01 DIAGNOSIS — M810 Age-related osteoporosis without current pathological fracture: Secondary | ICD-10-CM

## 2016-12-01 DIAGNOSIS — R109 Unspecified abdominal pain: Secondary | ICD-10-CM

## 2016-12-01 DIAGNOSIS — M129 Arthropathy, unspecified: Secondary | ICD-10-CM | POA: Insufficient documentation

## 2016-12-01 DIAGNOSIS — N1330 Unspecified hydronephrosis: Secondary | ICD-10-CM | POA: Insufficient documentation

## 2016-12-01 DIAGNOSIS — I739 Peripheral vascular disease, unspecified: Secondary | ICD-10-CM | POA: Insufficient documentation

## 2016-12-01 DIAGNOSIS — Z9079 Acquired absence of other genital organ(s): Secondary | ICD-10-CM

## 2016-12-01 DIAGNOSIS — Z7189 Other specified counseling: Secondary | ICD-10-CM | POA: Insufficient documentation

## 2016-12-01 LAB — CBC WITH DIFFERENTIAL/PLATELET
BASOS ABS: 0.1 10*3/uL (ref 0–0.1)
BASOS PCT: 2 %
EOS ABS: 0.3 10*3/uL (ref 0–0.7)
EOS PCT: 5 %
HCT: 34.7 % — ABNORMAL LOW (ref 40.0–52.0)
Hemoglobin: 11.8 g/dL — ABNORMAL LOW (ref 13.0–18.0)
LYMPHS PCT: 34 %
Lymphs Abs: 2.3 10*3/uL (ref 1.0–3.6)
MCH: 30.7 pg (ref 26.0–34.0)
MCHC: 34 g/dL (ref 32.0–36.0)
MCV: 90 fL (ref 80.0–100.0)
MONO ABS: 0.5 10*3/uL (ref 0.2–1.0)
Monocytes Relative: 8 %
Neutro Abs: 3.5 10*3/uL (ref 1.4–6.5)
Neutrophils Relative %: 51 %
PLATELETS: 144 10*3/uL — AB (ref 150–440)
RBC: 3.85 MIL/uL — AB (ref 4.40–5.90)
RDW: 14.2 % (ref 11.5–14.5)
WBC: 6.8 10*3/uL (ref 3.8–10.6)

## 2016-12-01 LAB — BASIC METABOLIC PANEL
ANION GAP: 10 (ref 5–15)
BUN: 20 mg/dL (ref 6–20)
CALCIUM: 9.1 mg/dL (ref 8.9–10.3)
CO2: 23 mmol/L (ref 22–32)
Chloride: 102 mmol/L (ref 101–111)
Creatinine, Ser: 0.95 mg/dL (ref 0.61–1.24)
GLUCOSE: 157 mg/dL — AB (ref 65–99)
POTASSIUM: 4.2 mmol/L (ref 3.5–5.1)
SODIUM: 135 mmol/L (ref 135–145)

## 2016-12-01 LAB — PSA: PROSTATIC SPECIFIC ANTIGEN: 8.99 ng/mL — AB (ref 0.00–4.00)

## 2016-12-01 MED ORDER — ENZALUTAMIDE 40 MG PO CAPS: 120.0000 mg | ORAL_CAPSULE | Freq: Every day | ORAL | 4 refills | Status: DC

## 2016-12-01 NOTE — Assessment & Plan Note (Addendum)
# #  CASTRATE RESISTANT PROSTATE CANCER- Metastatic disease. PSA aug 10th 2018- 5.37. Re-started on Lupron 22.5 on 8/15 /2018.  Discussed with the patient the concerns for progressive metastatic prostate cancer; also possibly leading to left ureter obstruction/hydronephrosis [C discussion below]  # Will plan to start X-tandi [3 pills/day-can reduce dose given his age]-discussed the mechanism of action of Xtandi; potential side effects including but not limited to fatigue small risk of seizures.  # Patient is a poor candidate for chemotherapy given his age. He understands all treatments are palliative; not curative.  # Left hydronephrosis/? Extrinsic mass pushing on Left ureters  s/p  Dr.Brandon; Urology; awaiting repeat CT scan as per urology.   s/p # Osteoporosis- Reviewed the bone density from 2015 - Severe osteoporosis. on Prolia q 6 M- last 10/03/2016. If patient has multiple bone lesions- on the PET scan; I would recommend switching to X [place].   # Iron deficiency anemia- question related to urinary blood loss. By mouth iron tolerating well. Hemoglobin 12. Hold off any IV iron at this time.  # No lupron today; Follow up in 4 weeks/labs/Lupron. Discussed with Dr.Brandon. Cancel bone scan; PET scan prior to next visit.  # 40 minutes face-to-face with the patient discussing the above plan of care; more than 50% of time spent on prognosis/ natural history; counseling and coordination.

## 2016-12-01 NOTE — Progress Notes (Signed)
[place] OFFICE PROGRESS NOTE  Patient Care Team: Venia Carbon, MD as PCP - General   SUMMARY OF ONCOLOGIC HISTORY: Oncology History    # 1996 PROSTATE CANCER [Gleason score 2+3] s/p Radical Prostatectomy [[facility]]; ?Biochem RECURRENT PROSTATE CANCER-Hormone sensitive;  Intermittent Lupron; on Lupron q 29M [Jan 2016 PSA- 7.9].  HOLD LUPRON in MAY 2017; AUg 2017- Re-start  # Osteoporosis [BMD- 2015]- on Reclast q 70M; FEB 2017- Start Prolia q 6 M     Prostate cancer ([place])   03/22/2009 Initial Diagnosis    Prostate cancer ([place])      INTERVAL HISTORY:  A very pleasant 83 year old male patient with above history of  Castrate resistant recurrent prostate cancer- on Lupron Recently underwent cystoscopy with stent placement for his left hydronephrosis. It is suspected that he has extrinsic compression at the level of the ureters causing this left hydronephrosis. His been referred back to [place] to discuss further treatment options.  Patient denies any new onset of abdominal pain nausea vomiting or diarrhea. Denies any abdominal discomfort while on iron pills. Appetite is good. No weight loss. No hot flashes. He feels fairly well for his age. Denies any difficulty urination. No jaw pain. He walks with a rolling walker. Denies any blood in urine.  REVIEW OF SYSTEMS:  A complete 10 point review of system is done which is negative except mentioned above/history of present illness.   PAST MEDICAL HISTORY :  Past Medical History:  Diagnosis Date  . Aortic atherosclerosis ([place])    Noted on CT  . Arthritis   . Colon polyps    adenomatous  . Dysrhythmia    pt unaware of this  . History of fracture of tibia   . History of tachycardia    patient unaware of this  . Kyphosis of cervicothoracic region    SEVERE  . Osteoporosis 01/2014   T -3.8 (01/2014)  . Other constipation    chronic  . Prostate cancer ([place])    surgery then lupron  . Unspecified venous (peripheral)  insufficiency   . Vitamin B12 deficiency     PAST SURGICAL HISTORY :   Past Surgical History:  Procedure Laterality Date  . BASAL CELL CARCINOMA EXCISION  2004   Left side of face  . CATARACT EXTRACTION Bilateral 2000, 2003   both eyes  . COLONOSCOPY     h/o adenomatous polyps  . CYSTOSCOPY W/ RETROGRADES Bilateral 11/19/2016   Procedure: CYSTOSCOPY WITH RETROGRADE PYELOGRAM;  Surgeon: Hollice Espy, MD;  Location: [facility] OR[facility];  Service: Urology;  Laterality: Bilateral;  General vs. Spinal  . CYSTOSCOPY WITH URETEROSCOPY AND STENT PLACEMENT Left 11/19/2016   Procedure: CYSTOSCOPY WITH URETEROSCOPY AND STENT PLACEMENT;  Surgeon: Hollice Espy, MD;  Location: [facility] OR[facility];  Service: Urology;  Laterality: Left;  . JOINT REPLACEMENT  2005   Left knee  . JOINT REPLACEMENT  12/13   Right knee  . PROSTATE SURGERY  1996   cancer  . sigmoid resection    . TIBIA FRACTURE SURGERY  1999  . URETERAL BIOPSY Left 11/19/2016   Procedure: URETERAL BIOPSY;  Surgeon: Hollice Espy, MD;  Location: [facility] OR[facility];  Service: Urology;  Laterality: Left;  Marland Kitchen VOLVULUS REDUCTION  12/10    FAMILY HISTORY :   Family History  Problem Relation Age of Onset  . Diabetes Son   . Hypertension Paternal Grandfather   . Prostate cancer Neg Hx   . Bladder Cancer Neg Hx   . Kidney cancer Neg Hx  SOCIAL HISTORY:   Social History  Substance Use Topics  . Smoking status: Never Smoker  . Smokeless tobacco: Never Used  . Alcohol use 0.0 - 0.6 oz/week     Comment: in past    ALLERGIES:  is allergic to other.  MEDICATIONS:  Current Outpatient Prescriptions  Medication Sig Dispense Refill  . aspirin 81 MG EC tablet Take 81 mg by mouth every other day.     . Biotin 2.5 MG CAPS Take by mouth.    . bismuth subsalicylate (PEPTO BISMOL) 262 MG/15ML suspension Take 10 mLs by mouth 4 (four) times daily -  before meals and at bedtime. Takes 2 1/2 times a day    . Calcium Carbonate-Vitamin D (CALCIUM-D) 600-400 MG-UNIT  TABS Take 1 tablet by mouth daily.     . carbamide peroxide (DEBROX) 6.5 % OTIC solution 5 drops as needed (ear wax impaction).    . cholecalciferol (VITAMIN D) 1000 UNITS tablet Take 1,000 Units by mouth 2 (two) times daily.     . clindamycin (CLEOCIN) 150 MG capsule Take 150 mg by mouth. Take 4 capsules prior to dental work    . cyclobenzaprine (FLEXERIL) 5 MG tablet Take 5 mg by mouth at bedtime as needed for muscle spasms.    . diphenhydrAMINE (BENADRYL) 25 mg capsule Take 25 mg by mouth every 4 (four) hours as needed for itching or allergies.    . fluticasone (FLONASE) 50 MCG/ACT nasal spray USE 2 SPRAYS IN EACH NOSTRIL DAILY 48 g 3  . HYDROcodone-acetaminophen (NORCO/VICODIN) 5-325 MG tablet Take 1-2 tablets by mouth every 6 (six) hours as needed for moderate pain. 10 tablet 0  . ibuprofen (ADVIL,MOTRIN) 200 MG tablet Take 400 mg by mouth daily as needed.    . latanoprost (XALATAN) 0.005 % ophthalmic solution Place 1 drop into both eyes at bedtime.    Marland Kitchen leuprolide (LUPRON) 30 MG injection Inject 30 mg into the muscle every 4 (four) months. Dose and interval not clear yet    . loratadine (CLARITIN) 10 MG tablet TAKE 1 TABLET DAILY (Patient taking differently: 2 tabs hs) 90 tablet 3  . nystatin (NYSTATIN) powder Apply topically 2 (two) times daily as needed (to groin and abd fold areas as needed for rash).    . sennosides-docusate sodium (SENOKOT-S) 8.6-50 MG tablet Take 4 tablets by mouth daily.     . Skin Protectants, Misc. (EUCERIN) cream Apply 1 application topically as needed for dry skin.    Marland Kitchen traMADol (ULTRAM) 50 MG tablet Take 1 tablet (50 mg total) by mouth 3 (three) times daily as needed. (Patient taking differently: Take 50 mg by mouth every 6 (six) hours as needed. ) 270 tablet 0  . triamcinolone cream (KENALOG) 0.1 % Apply 1 application topically every other day. Alternating with Ciclopirox    . vitamin B-12 (CYANOCOBALAMIN) 1000 MCG tablet Take 1 tablet (1,000 mcg total) by mouth  daily. 90 tablet 3  . vitamin C (ASCORBIC ACID) 500 MG tablet Take 500 mg by mouth daily.    . zoledronic acid (RECLAST) 5 MG/100ML SOLN injection Inject 5 mg into the vein every 6 (six) months.     . enzalutamide (XTANDI) 40 MG capsule Take 3 capsules (120 mg total) by mouth daily. 90 capsule 4  . polyethylene glycol powder (GLYCOLAX/MIRALAX) powder FILL TO LINE (17 GRAMS ), MIX AND DRINK TWICE A DAY AND 8.5 GRAMS AT NOON (Patient not taking: Reported on 12/01/2016) 510 g 3   No current facility-administered medications  for this visit.    Facility-Administered Medications Ordered in Other Visits  Medication Dose Route Frequency Provider Last Rate Last Dose  . leuprolide (LUPRON) injection 30 mg  30 mg Intramuscular Once Leia Alf, MD        PHYSICAL EXAMINATION: ECOG PERFORMANCE STATUS: 1 - Symptomatic but completely ambulatory  BP (!) 143/75 (BP Location: Right Arm, Patient Position: Sitting)   Pulse 68   Temp 97.6 F (36.4 C) (Tympanic)   Resp 20   Ht 6' 2.25" (1.886 m)   Wt 177 lb 12.8 oz (80.6 kg)   BMI 22.67 kg/m   Filed Weights   12/01/16 1422  Weight: 177 lb 12.8 oz (80.6 kg)    GENERAL: Well-nourished well-developed; Alert, no distress and comfortable.  Alone; walks with a rolling walker EYES: no pallor or icterus OROPHARYNX: no thrush or ulceration; good dentition  NECK: supple, no masses felt LYMPH:  no palpable lymphadenopathy in the cervical, axillary or inguinal regions LUNGS: clear to auscultation and  No wheeze or crackles HEART/CVS: regular rate & rhythm and no murmurs; No lower extremity edema ABDOMEN:abdomen soft, non-tender and normal bowel sounds Musculoskeletal:no cyanosis of digits and no clubbing  PSYCH: alert & oriented x 3 with fluent speech NEURO: no focal motor/sensory deficits SKIN:  no rashes or significant lesions  LABORATORY DATA:  I have reviewed the data as listed    Component Value Date/Time   NA 135 12/01/2016 1412   NA 140  02/11/2012 0415   K 4.2 12/01/2016 1412   K 3.9 02/11/2012 0415   CL 102 12/01/2016 1412   CL 108 (H) 02/11/2012 0415   CO2 23 12/01/2016 1412   CO2 25 02/11/2012 0415   GLUCOSE 157 (H) 12/01/2016 1412   GLUCOSE 125 (H) 02/11/2012 0415   BUN 20 12/01/2016 1412   BUN 12 02/11/2012 0415   CREATININE 0.95 12/01/2016 1412   CREATININE 1.00 03/23/2014 1400   CALCIUM 9.1 12/01/2016 1412   CALCIUM 8.3 (L) 03/23/2014 1400   PROT 7.7 07/02/2016 1344   PROT 7.0 03/07/2014 1414   ALBUMIN 3.9 07/02/2016 1344   ALBUMIN 3.7 03/07/2014 1414   AST 24 07/02/2016 1344   AST 16 03/07/2014 1414   ALT 12 (L) 07/02/2016 1344   ALT 19 03/07/2014 1414   ALKPHOS 63 07/02/2016 1344   ALKPHOS 69 03/07/2014 1414   BILITOT 0.5 07/02/2016 1344   BILITOT 0.3 03/07/2014 1414   GFRNONAA >60 12/01/2016 1412   GFRNONAA >60 03/23/2014 1400   GFRNONAA 49 (L) 09/02/2013 1428   GFRAA >60 12/01/2016 1412   GFRAA >60 03/23/2014 1400   GFRAA 57 (L) 09/02/2013 1428    No results found for: SPEP, UPEP  Lab Results  Component Value Date   WBC 6.8 12/01/2016   NEUTROABS 3.5 12/01/2016   HGB 11.8 (L) 12/01/2016   HCT 34.7 (L) 12/01/2016   MCV 90.0 12/01/2016   PLT 144 (L) 12/01/2016      Chemistry      Component Value Date/Time   NA 135 12/01/2016 1412   NA 140 02/11/2012 0415   K 4.2 12/01/2016 1412   K 3.9 02/11/2012 0415   CL 102 12/01/2016 1412   CL 108 (H) 02/11/2012 0415   CO2 23 12/01/2016 1412   CO2 25 02/11/2012 0415   BUN 20 12/01/2016 1412   BUN 12 02/11/2012 0415   CREATININE 0.95 12/01/2016 1412   CREATININE 1.00 03/23/2014 1400   GLU 114 06/30/2016  Component Value Date/Time   CALCIUM 9.1 12/01/2016 1412   CALCIUM 8.3 (L) 03/23/2014 1400   ALKPHOS 63 07/02/2016 1344   ALKPHOS 69 03/07/2014 1414   AST 24 07/02/2016 1344   AST 16 03/07/2014 1414   ALT 12 (L) 07/02/2016 1344   ALT 19 03/07/2014 1414   BILITOT 0.5 07/02/2016 1344   BILITOT 0.3 03/07/2014 1414     Results  for Benjamin, Villarreal (MRN [id number]) as of 11/03/2016 10:44  Ref. Range 10/03/2015 10:22 01/03/2016 09:50 04/04/2016 09:56 07/02/2016 13:44 10/03/2016 13:41  PSA Latest Ref Range: 0.00 - 4.00 ng/mL 2.19 2.35 2.23 3.98   Prostatic Specific Antigen Latest Ref Range: 0.00 - 4.00 ng/mL     5.37 (H)         ASSESSMENT & PLAN:   Prostate cancer ([place]) # #CASTRATE RESISTANT PROSTATE CANCER- Metastatic disease. PSA aug 10th 2018- 5.37. Re-started on Lupron 22.5 on 8/15 /2018.  Discussed with the patient the concerns for progressive metastatic prostate cancer; also possibly leading to left ureter obstruction/hydronephrosis [C discussion below]  # Will plan to start X-tandi [3 pills/day-can reduce dose given his age]-discussed the mechanism of action of Xtandi; potential side effects including but not limited to fatigue small risk of seizures.  # Patient is a poor candidate for chemotherapy given his age. He understands all treatments are palliative; not curative.  # Left hydronephrosis/? Extrinsic mass pushing on Left ureters  s/p  Dr.Brandon; Urology; awaiting repeat CT scan as per urology.   s/p # Osteoporosis- Reviewed the bone density from 2015 - Severe osteoporosis. on Prolia q 6 M- last 10/03/2016. If patient has multiple bone lesions- on the PET scan; I would recommend switching to X [place].   # Iron deficiency anemia- question related to urinary blood loss. By mouth iron tolerating well. Hemoglobin 12. Hold off any IV iron at this time.  # No lupron today; Follow up in 4 weeks/labs/Lupron. Discussed with Dr.Brandon. Cancel bone scan; PET scan prior to next visit.  # 40 minutes face-to-face with the patient discussing the above plan of care; more than 50% of time spent on prognosis/ natural history; counseling and coordination.      Cammie Sickle, MD 12/01/2016 4:14 PM

## 2016-12-01 NOTE — Progress Notes (Signed)
Patient here for his lupron injection today. He has no medical complaints. He recently had a uteral stent placement for h/o left hydronephrosis. He denies any difficulty voiding or blood in urine.

## 2016-12-02 ENCOUNTER — Telehealth: Payer: Self-pay | Admitting: Pharmacist

## 2016-12-02 DIAGNOSIS — C61 Malignant neoplasm of prostate: Secondary | ICD-10-CM

## 2016-12-02 MED ORDER — ENZALUTAMIDE 40 MG PO CAPS: 120.0000 mg | ORAL_CAPSULE | Freq: Every day | ORAL | 4 refills | Status: DC

## 2016-12-02 NOTE — Telephone Encounter (Signed)
Oral Oncology Pharmacist Encounter  Received new prescription for Xtandi (enzalutamide) for the treatment of metastatic prostate cancer in conjunction with Lupron, planned duration until disease progression or unacceptable drug toxicity.  CBC/CMP from 12/01/16 assessed, no relevant lab abnormalities. Prescription dose and frequency assessed.   Current medication list in Epic reviewed, no relevant DDIs with Xtandi identified.   I spoke with patient for overview of new oral chemotherapy following his office visit on 12/01/16.   Pt is doing well. Counseled patient on administration, dosing, side effects, monitoring, drug-food interactions, safe handling, storage, and disposal. Patient will take 3 capsules (120 mg total) by mouth daily.  Side effects include but not limited to: Fatigue, HTN, peripheral edema.    Reviewed with patient importance of keeping a medication schedule and plan for any missed doses.  Mr. Strubel voiced understanding and appreciation. All questions answered.  Prescription has been e-scribed to the [facility] for benefits analysis and approval.  Oral Oncology Clinic will continue to follow for insurance authorization, copayment issues, initial counseling and start date.  Provided patient with [facility] [place] phone number. Patient knows to call the office with questions or concerns. Oral Chemotherapy Navigation Clinic will continue to follow.  Darl Pikes, PharmD, BCPS Hematology/Oncology Clinical Pharmacist [facility] [place] [facility] [phone]  12/02/2016 3:13 PM

## 2016-12-03 ENCOUNTER — Ambulatory Visit
Admission: RE | Admit: 2016-12-03 | Discharge: 2016-12-03 | Disposition: A | Discharge status: Home / Self Care | Payer: Medicare Other | Source: Ambulatory Visit | Attending: Urology | Admitting: Urology

## 2016-12-03 DIAGNOSIS — N1330 Unspecified hydronephrosis: Secondary | ICD-10-CM

## 2016-12-03 DIAGNOSIS — Z9079 Acquired absence of other genital organ(s): Secondary | ICD-10-CM | POA: Insufficient documentation

## 2016-12-03 DIAGNOSIS — N289 Disorder of kidney and ureter, unspecified: Secondary | ICD-10-CM | POA: Insufficient documentation

## 2016-12-03 DIAGNOSIS — I700 Atherosclerosis of aorta: Secondary | ICD-10-CM | POA: Insufficient documentation

## 2016-12-03 DIAGNOSIS — R591 Generalized enlarged lymph nodes: Secondary | ICD-10-CM | POA: Insufficient documentation

## 2016-12-05 NOTE — Telephone Encounter (Signed)
Oral Oncology Patient Advocate Encounter  Received notification from [employer] that prior authorization for [name] is required.  PA submitted on CoverMyMeds Key Paper Status is pending  Oral Oncology Clinic will continue to follow.   [place] Patient Advocate [phone] 12/05/2016 10:23 AM

## 2016-12-05 NOTE — Telephone Encounter (Signed)
Oral Oncology Patient Advocate Encounter  Prior Authorization for [name] has been approved.    PA# [id number] Effective dates: 11/03/2016 through 02/23/2098  Oral Oncology Clinic will continue to follow.     [place] Patient Advocate [phone] 12/05/2016 10:25 AM

## 2016-12-08 MED FILL — XTANDI 40 MG CAPSULE: 40 | 30 days supply | Qty: 90 | Fill #0

## 2016-12-08 NOTE — Telephone Encounter (Signed)
Oral Oncology Patient Advocate Encounter  Called [facility] to give them the credit card information on this patient, and asked to mail his Xtandi medication out. Patient will have in hand by Thursday.  [facility] Patient Advocate [phone] 12/08/2016 4:37 PM

## 2016-12-09 DIAGNOSIS — H40003 Preglaucoma, unspecified, bilateral: Secondary | ICD-10-CM | POA: Diagnosis not present

## 2016-12-10 ENCOUNTER — Ambulatory Visit (INDEPENDENT_AMBULATORY_CARE_PROVIDER_SITE_OTHER): Payer: Medicare Other | Admitting: Urology

## 2016-12-10 ENCOUNTER — Encounter: Payer: Self-pay | Admitting: Urology

## 2016-12-10 VITALS — BP 119/64 | HR 72 | Ht 74.25 in | Wt 178.0 lb

## 2016-12-10 DIAGNOSIS — N1330 Unspecified hydronephrosis: Secondary | ICD-10-CM

## 2016-12-10 DIAGNOSIS — C61 Malignant neoplasm of prostate: Secondary | ICD-10-CM

## 2016-12-10 DIAGNOSIS — N3289 Other specified disorders of bladder: Secondary | ICD-10-CM | POA: Diagnosis not present

## 2016-12-10 LAB — URINALYSIS, COMPLETE
BILIRUBIN UA: NEGATIVE
Glucose, UA: NEGATIVE
Ketones, UA: NEGATIVE
NITRITE UA: NEGATIVE
PH UA: 5.5 (ref 5.0–7.5)
Protein, UA: NEGATIVE
SPEC GRAV UA: 1.01 (ref 1.005–1.030)
Urobilinogen, Ur: 0.2 mg/dL (ref 0.2–1.0)

## 2016-12-10 LAB — MICROSCOPIC EXAMINATION
BACTERIA UA: NONE SEEN
EPITHELIAL CELLS (NON RENAL): NONE SEEN /HPF (ref 0–10)

## 2016-12-10 NOTE — Progress Notes (Signed)
12/10/2016 2:32 PM   Benjamin Villarreal 22-May-1920 [id number]  Referring provider: Venia Carbon, MD [address] [place],  [address]  Chief Complaint  Patient presents with  . Post-op Follow-up    HPI: 83 year old male referred by Dr. Rogue Bussing with left-sided hydronephrosis with an enhancing lesion at the left UVJ measuring 1.9 cm concerning for possible bladder malignancy.  He has new left periaortic lymphadenopathy measuring 9 mm as well as the 9 mm left iliac lymph node.  He was taken to the OR on 11/19/16 Which time diagnostic ureteroscopy was performed which no intraluminal lesion within the ureter. Findings were most consistent with extrinsic compression on the ureter. The stent was placed, but due to tortuosity of the ureter, was unclear whether the position was adequate.  Follow-up noncontrast CT scan on 12/03/2016 shows decompression of the left collecting system following stent placement, despite the fact that the proximal coil of the stent was within the proximal ureter.  She also been interval increase in size of left periaortic lymph node.  He has tolerated the stent very well.  He has some mild left flank discomfort with voiding but this is not bothersome.  No dysuria or gross hematuria.     He also has an incidental hyperdense lesion originating from the left mid kidney measuring 1.3 cm which is not definitively characterized.  He does have a personal history of prostate cancer status post open prostatectomy 1996. He developed biochemical recurrence and is been managed on intermittent ADT.   He had a brief break from Lupron, last injection 01/11/2016 but resumed after PSA began to rise. More recently, his PSA has began to slowly rise Most recently to 5.34 on 10/03/2016 up from 2.23 on 2/18.    He has since followed up wit his oncologist and now is being treated for metastatic castrate resistant prostate cancer.  He has been prescribed  Xtandi with plan for PET  scan later this month.  He has not yet started this medication.     Most recent creatinine around the time of the CT scan on 10/03/2016 normal, 1.06.    PMH: Past Medical History:  Diagnosis Date  . Aortic atherosclerosis (Benjamin Villarreal)    Noted on CT  . Arthritis   . Colon polyps    adenomatous  . Dysrhythmia    pt unaware of this  . History of fracture of tibia   . History of tachycardia    patient unaware of this  . Kyphosis of cervicothoracic region    SEVERE  . Osteoporosis 01/2014   T -3.8 (01/2014)  . Other constipation    chronic  . Prostate cancer (Benjamin Villarreal)    surgery then lupron  . Unspecified venous (peripheral) insufficiency   . Vitamin B12 deficiency     Surgical History: Past Surgical History:  Procedure Laterality Date  . BASAL CELL CARCINOMA EXCISION  2004   Left side of face  . CATARACT EXTRACTION Bilateral 2000, 2003   both eyes  . COLONOSCOPY     h/o adenomatous polyps  . CYSTOSCOPY W/ RETROGRADES Bilateral 11/19/2016   Procedure: CYSTOSCOPY WITH RETROGRADE PYELOGRAM;  Surgeon: Hollice Espy, MD;  Location: [facility];  Service: Urology;  Laterality: Bilateral;  General vs. Spinal  . CYSTOSCOPY WITH URETEROSCOPY AND STENT PLACEMENT Left 11/19/2016   Procedure: CYSTOSCOPY WITH URETEROSCOPY AND STENT PLACEMENT;  Surgeon: Hollice Espy, MD;  Location: [facility];  Service: Urology;  Laterality: Left;  . JOINT REPLACEMENT  2005   Left  knee  . JOINT REPLACEMENT  12/13   Right knee  . PROSTATE SURGERY  1996   cancer  . sigmoid resection    . TIBIA FRACTURE SURGERY  1999  . URETERAL BIOPSY Left 11/19/2016   Procedure: URETERAL BIOPSY;  Surgeon: Hollice Espy, MD;  Location: [facility];  Service: Urology;  Laterality: Left;  Marland Kitchen VOLVULUS REDUCTION  12/10    Home Medications:  Allergies as of 12/10/2016      Reactions   Other    RAW ONIONS- SINUS REACTION      Medication List       Accurate as of 12/10/16  2:32 PM. Always use your most recent med list.            aspirin 81 MG EC tablet Take 81 mg by mouth every other day.   Biotin 2.5 MG Caps Take by mouth.   bismuth subsalicylate 834 HD/62IW suspension Commonly known as:  PEPTO BISMOL Take 10 mLs by mouth 4 (four) times daily -  before meals and at bedtime. Takes 2 1/2 times a day   Calcium-D 600-400 MG-UNIT Tabs Take 1 tablet by mouth daily.   carbamide peroxide 6.5 % OTIC solution Commonly known as:  DEBROX 5 drops as needed (ear wax impaction).   cholecalciferol 1000 units tablet Commonly known as:  VITAMIN D Take 1,000 Units by mouth 2 (two) times daily.   clindamycin 150 MG capsule Commonly known as:  CLEOCIN Take 150 mg by mouth. Take 4 capsules prior to dental work   cyclobenzaprine 5 MG tablet Commonly known as:  FLEXERIL Take 5 mg by mouth at bedtime as needed for muscle spasms.   diphenhydrAMINE 25 mg capsule Commonly known as:  BENADRYL Take 25 mg by mouth every 4 (four) hours as needed for itching or allergies.   enzalutamide 40 MG capsule Commonly known as:  XTANDI Take 3 capsules (120 mg total) by mouth daily.   eucerin cream Apply 1 application topically as needed for dry skin.   fluticasone 50 MCG/ACT nasal spray Commonly known as:  FLONASE USE 2 SPRAYS IN EACH NOSTRIL DAILY   HYDROcodone-acetaminophen 5-325 MG tablet Commonly known as:  NORCO/VICODIN Take 1-2 tablets by mouth every 6 (six) hours as needed for moderate pain.   ibuprofen 200 MG tablet Commonly known as:  ADVIL,MOTRIN Take 400 mg by mouth daily as needed.   latanoprost 0.005 % ophthalmic solution Commonly known as:  XALATAN Place 1 drop into both eyes at bedtime.   leuprolide 30 MG injection Commonly known as:  LUPRON Inject 30 mg into the muscle every 4 (four) months. Dose and interval not clear yet   loratadine 10 MG tablet Commonly known as:  CLARITIN TAKE 1 TABLET DAILY   nystatin powder Generic drug:  nystatin Apply topically 2 (two) times daily as needed (to groin  and abd fold areas as needed for rash).   polyethylene glycol powder powder Commonly known as:  GLYCOLAX/MIRALAX FILL TO LINE (17 GRAMS ), MIX AND DRINK TWICE A DAY AND 8.5 GRAMS AT NOON   sennosides-docusate sodium 8.6-50 MG tablet Commonly known as:  SENOKOT-S Take 4 tablets by mouth daily.   traMADol 50 MG tablet Commonly known as:  ULTRAM Take 1 tablet (50 mg total) by mouth 3 (three) times daily as needed.   triamcinolone cream 0.1 % Commonly known as:  KENALOG Apply 1 application topically every other day. Alternating with Ciclopirox   vitamin B-12 1000 MCG tablet Commonly known as:  CYANOCOBALAMIN  Take 1 tablet (1,000 mcg total) by mouth daily.   vitamin C 500 MG tablet Commonly known as:  ASCORBIC ACID Take 500 mg by mouth daily.   zoledronic acid 5 MG/100ML Soln injection Commonly known as:  RECLAST Inject 5 mg into the vein every 6 (six) months.       Allergies:  Allergies  Allergen Reactions  . Other     RAW ONIONS- SINUS REACTION    Family History: Family History  Problem Relation Age of Onset  . Diabetes Son   . Hypertension Paternal Grandfather   . Prostate cancer Neg Hx   . Bladder Cancer Neg Hx   . Kidney cancer Neg Hx     Social History:  reports that he has never smoked. He has never used smokeless tobacco. He reports that he drinks alcohol. He reports that he does not use drugs.  ROS: UROLOGY Frequent Urination?: No Hard to postpone urination?: No Burning/pain with urination?: No Get up at night to urinate?: Yes Leakage of urine?: Yes Urine stream starts and stops?: No Trouble starting stream?: No Do you have to strain to urinate?: Yes Blood in urine?: No Urinary tract infection?: No Sexually transmitted disease?: No Injury to kidneys or bladder?: No Painful intercourse?: No Weak stream?: No Erection problems?: No Penile pain?: No  Gastrointestinal Nausea?: No Vomiting?: No Indigestion/heartburn?: No Diarrhea?:  No Constipation?: No  Constitutional Fever: No Night sweats?: No Weight loss?: No Fatigue?: No  Skin Skin rash/lesions?: No Itching?: No  Eyes Blurred vision?: No Double vision?: Yes  Ears/Nose/Throat Sore throat?: No Sinus problems?: Yes  Hematologic/Lymphatic Swollen glands?: No Easy bruising?: No  Cardiovascular Leg swelling?: No Chest pain?: No  Respiratory Cough?: Yes Shortness of breath?: No  Endocrine Excessive thirst?: No  Musculoskeletal Back pain?: No Joint pain?: No  Neurological Headaches?: No Dizziness?: No  Psychologic Depression?: No Anxiety?: No  Physical Exam: BP 119/64 (BP Location: Right Arm, Patient Position: Sitting, Cuff Size: Normal)   Pulse 72   Ht 6' 2.25" (1.886 m)   Wt 178 lb (80.7 kg)   BMI 22.70 kg/m   Constitutional:  Alert and oriented, No acute distress.  Elderly, ambulating with walker. Accompanied today by his daughter. HEENT: [place] AT, moist mucus membranes.  Trachea midline, no masses. Cardiovascular: No clubbing, cyanosis, or edema. Respiratory: Normal respiratory effort, no increased work of breathing. Skin: No rashes, bruises or suspicious lesions. Neurologic: Grossly intact, no focal deficits, moving all 4 extremities.   Psychiatric: Normal mood and affect.  Laboratory Data: Lab Results  Component Value Date   WBC 6.8 12/01/2016   HGB 11.8 (L) 12/01/2016   HCT 34.7 (L) 12/01/2016   MCV 90.0 12/01/2016   PLT 144 (L) 12/01/2016    Lab Results  Component Value Date   CREATININE 0.95 12/01/2016    Lab Results  Component Value Date   PSA 3.98 07/02/2016   PSA 2.23 04/04/2016   PSA 2.35 01/03/2016    Lab Results  Component Value Date   TESTOSTERONE <3 (L) 10/03/2016   Urinalysis N/a  Pertinent Imaging: CLINICAL DATA:  Left kidney hydronephrosis.  EXAM: CT ABDOMEN AND PELVIS WITHOUT CONTRAST  TECHNIQUE: Multidetector CT imaging of the abdomen and pelvis was performed following the  standard protocol without IV contrast.  COMPARISON:  10/06/2016  FINDINGS: Lower chest: No acute abnormality.  Hepatobiliary: No focal liver abnormality. The gallbladder appears within normal limits. No biliary dilatation.  Pancreas: Fatty replacement of the pancreas identified.  Spleen: Normal in size  without focal abnormality.  Adrenals/Urinary Tract: Normal appearance of the adrenal glands. No right kidney stone or hydronephrosis identified. Again noted are bilateral kidney lesions of varying density. These are incompletely characterized without IV contrast. This includes an hyperdense lesion arising from the inferior pole of the right kidney measuring 1.2 cm and a hyperdense lesion arising from the inferior pole the left kidney measuring 1.6 cm.  There has been interval decompression of previously dilated left renal collecting system. Left-sided nephroureteral stent is identified. The proximal pigtail is within the proximal left ureter distal to the UPJ.  Previously described enhancing lesion at the left UVJ is difficult to characterize without IV contrast. On today's study this measures approximately 2.5 x 1.4 cm, image 75 of series 2. Previously 1.9 x 1.7 cm.  Stomach/Bowel: Stomach is normal. The small bowel loops have a normal course and caliber without evidence for a bowel obstruction. The appendix is visualized and appears normal. Unremarkable appearance of the colon. No pathologic dilatation of the large or small bowel loops.  Vascular/Lymphatic: Aortic atherosclerosis. Left-sided periaortic node measures 11 mm, image 39 of series 2. Previously 9 mm. Bilateral pelvic lymph node dissection. 9 mm left common iliac lymph node is stable, image 48 of series 2. Adjacent node measures 1 cm, image 51 of series 2. Previously this measured the same.  Reproductive: Previous prostatectomy.  Other: No free fluid or fluid collections within the abdomen  or pelvis.  Musculoskeletal: Chronic compression fracture involve L3 is identified. There is degenerative disc disease noted at L4-5. No aggressive lytic or sclerotic bone lesions.  IMPRESSION: 1. Interval decompression of the left renal collecting system status post stent placement. Stent appears to of migrated distally. The pigtail is in the proximal left ureter below the level of the UPJ. 2. Difficult to reassess enhancing lesion at the left UVJ due to lack of IV contrast material. This appears to be slightly increased in size from previous exam. 3. Increase in size of left periaortic lymph node. Stable appearance of borderline enlarged left common iliac nodes. 4. Bilateral kidney lesions of varying density are incompletely characterized without IV contrast. 5. Status post prostatectomy and bilateral pelvic lymph node dissection. 6.  Aortic Atherosclerosis (ICD10-I70.0).   Electronically Signed   By: Kerby Moors M.D.   On: 12/03/2016 09:25  CT scan personally reviewed today. Resolution of left hydronephrosis with proximal coil of left ureteral stent in proximal ureter.     Assessment & Plan:   83 yo M with metastatic castrate resistant prostate cancer resulting in extrinsic compression on the left ureter resulting in hydronephrosis.     1. Prostate cancer [facility]) Managed by Dr. Rogue Bussing- starting on Xtandi/ Lupron PET scan ordered for later this month - Urinalysis, Complete  2. Hydronephrosis, left As above Improved hydronephrosis with placement of a ureteral stent despite suboptimal positioning (significant proximal ureteral tortuosity) Plan to leave stent in place at this time, plain for stent exchange at 3 month interval At this point, if there is minimal encrustation, will plan for every 6 month stent exchanges until there has been radiological decrease size of pelvic mass  Return in about 2 months (around 02/09/2017) for to arrange for stent exchange, UA/  UCx.  Hollice Espy, MD  [facility] [address] [place], [place] [address] [phone]  I spent 25 min with this patient of which greater than 50% was spent in counseling and coordination of care with the patient.   Case was discussed  with Dr. Rogue Bussing

## 2016-12-11 ENCOUNTER — Other Ambulatory Visit: Payer: Medicare Other

## 2016-12-11 ENCOUNTER — Ambulatory Visit: Payer: Medicare Other

## 2016-12-12 ENCOUNTER — Encounter: Payer: Self-pay | Admitting: Pharmacist

## 2016-12-12 NOTE — Telephone Encounter (Deleted)
Oral Chemotherapy Pharmacist Encounter  Called [employer] to check on the status of the status of Ms. [place] application. They are requesting proof that there is no foundation assistance available and a financial hardship letter.   Both were faxed to [employer]. Will continue to follow patient medication access.   Thank you,  Darl Pikes, PharmD, BCPS Hematology/Oncology Clinical Pharmacist [facility] [phone]  12/12/2016 3:05 PM

## 2016-12-15 ENCOUNTER — Ambulatory Visit: Payer: Medicare Other | Admitting: Internal Medicine

## 2016-12-15 ENCOUNTER — Other Ambulatory Visit: Payer: Medicare Other

## 2016-12-17 NOTE — Telephone Encounter (Signed)
Errounous Encounter

## 2016-12-23 ENCOUNTER — Encounter
Admission: RE | Admit: 2016-12-23 | Discharge: 2016-12-23 | Disposition: A | Discharge status: Home / Self Care | Payer: Medicare Other | Source: Ambulatory Visit | Attending: Internal Medicine | Admitting: Internal Medicine

## 2016-12-23 DIAGNOSIS — C61 Malignant neoplasm of prostate: Secondary | ICD-10-CM | POA: Insufficient documentation

## 2016-12-23 DIAGNOSIS — R590 Localized enlarged lymph nodes: Secondary | ICD-10-CM | POA: Diagnosis not present

## 2016-12-23 MED ORDER — AXUMIN (FLUCICLOVINE F 18) INJECTION
10.3500 | Freq: Once | INTRAVENOUS | Status: AC
  Administered 2016-12-23: 10.35 via INTRAVENOUS

## 2016-12-29 ENCOUNTER — Inpatient Hospital Stay: Payer: Medicare Other | Attending: Internal Medicine

## 2016-12-29 ENCOUNTER — Inpatient Hospital Stay (HOSPITAL_BASED_OUTPATIENT_CLINIC_OR_DEPARTMENT_OTHER): Payer: Medicare Other | Admitting: Internal Medicine

## 2016-12-29 ENCOUNTER — Inpatient Hospital Stay: Payer: Medicare Other

## 2016-12-29 VITALS — BP 118/61 | HR 66 | Temp 97.8°F | Resp 20 | Ht 74.5 in | Wt 178.4 lb

## 2016-12-29 DIAGNOSIS — I700 Atherosclerosis of aorta: Secondary | ICD-10-CM | POA: Insufficient documentation

## 2016-12-29 DIAGNOSIS — M129 Arthropathy, unspecified: Secondary | ICD-10-CM

## 2016-12-29 DIAGNOSIS — Z86010 Personal history of colonic polyps: Secondary | ICD-10-CM | POA: Diagnosis not present

## 2016-12-29 DIAGNOSIS — C61 Malignant neoplasm of prostate: Secondary | ICD-10-CM

## 2016-12-29 DIAGNOSIS — M40203 Unspecified kyphosis, cervicothoracic region: Secondary | ICD-10-CM | POA: Diagnosis not present

## 2016-12-29 DIAGNOSIS — N1330 Unspecified hydronephrosis: Secondary | ICD-10-CM

## 2016-12-29 DIAGNOSIS — I739 Peripheral vascular disease, unspecified: Secondary | ICD-10-CM | POA: Diagnosis not present

## 2016-12-29 DIAGNOSIS — R5383 Other fatigue: Secondary | ICD-10-CM | POA: Insufficient documentation

## 2016-12-29 DIAGNOSIS — Z79818 Long term (current) use of other agents affecting estrogen receptors and estrogen levels: Secondary | ICD-10-CM

## 2016-12-29 DIAGNOSIS — Z8781 Personal history of (healed) traumatic fracture: Secondary | ICD-10-CM | POA: Diagnosis not present

## 2016-12-29 DIAGNOSIS — M810 Age-related osteoporosis without current pathological fracture: Secondary | ICD-10-CM | POA: Insufficient documentation

## 2016-12-29 DIAGNOSIS — Z85828 Personal history of other malignant neoplasm of skin: Secondary | ICD-10-CM | POA: Insufficient documentation

## 2016-12-29 DIAGNOSIS — D509 Iron deficiency anemia, unspecified: Secondary | ICD-10-CM | POA: Diagnosis not present

## 2016-12-29 DIAGNOSIS — Z79899 Other long term (current) drug therapy: Secondary | ICD-10-CM | POA: Diagnosis not present

## 2016-12-29 DIAGNOSIS — E538 Deficiency of other specified B group vitamins: Secondary | ICD-10-CM

## 2016-12-29 DIAGNOSIS — K5900 Constipation, unspecified: Secondary | ICD-10-CM | POA: Insufficient documentation

## 2016-12-29 DIAGNOSIS — Z7982 Long term (current) use of aspirin: Secondary | ICD-10-CM | POA: Insufficient documentation

## 2016-12-29 LAB — COMPREHENSIVE METABOLIC PANEL
ALBUMIN: 4.1 g/dL (ref 3.5–5.0)
ALK PHOS: 48 U/L (ref 38–126)
ALT: 14 U/L — ABNORMAL LOW (ref 17–63)
ANION GAP: 9 (ref 5–15)
AST: 27 U/L (ref 15–41)
BUN: 21 mg/dL — ABNORMAL HIGH (ref 6–20)
CO2: 21 mmol/L — AB (ref 22–32)
Calcium: 9.1 mg/dL (ref 8.9–10.3)
Chloride: 106 mmol/L (ref 101–111)
Creatinine, Ser: 0.88 mg/dL (ref 0.61–1.24)
GFR calc Af Amer: >60 mL/min (ref >60)
GFR calc non Af Amer: >60 mL/min (ref >60)
GLUCOSE: 155 mg/dL — AB (ref 65–99)
POTASSIUM: 4.1 mmol/L (ref 3.5–5.1)
SODIUM: 136 mmol/L (ref 135–145)
Total Bilirubin: 0.6 mg/dL (ref 0.3–1.2)
Total Protein: 7.3 g/dL (ref 6.5–8.1)

## 2016-12-29 LAB — CBC WITH DIFFERENTIAL/PLATELET
Basophils Absolute: 0.1 10*3/uL (ref 0–0.1)
Basophils Relative: 1 %
Eosinophils Absolute: 0.3 10*3/uL (ref 0–0.7)
Eosinophils Relative: 4 %
HEMATOCRIT: 35.7 % — AB (ref 40.0–52.0)
Hemoglobin: 12.2 g/dL — ABNORMAL LOW (ref 13.0–18.0)
LYMPHS ABS: 1.7 10*3/uL (ref 1.0–3.6)
LYMPHS PCT: 31 %
MCH: 30.9 pg (ref 26.0–34.0)
MCHC: 34.1 g/dL (ref 32.0–36.0)
MCV: 90.6 fL (ref 80.0–100.0)
MONO ABS: 0.5 10*3/uL (ref 0.2–1.0)
MONOS PCT: 9 %
NEUTROS ABS: 3.1 10*3/uL (ref 1.4–6.5)
Neutrophils Relative %: 55 %
Platelets: 132 10*3/uL — ABNORMAL LOW (ref 150–440)
RBC: 3.95 MIL/uL — ABNORMAL LOW (ref 4.40–5.90)
RDW: 14 % (ref 11.5–14.5)
WBC: 5.7 10*3/uL (ref 3.8–10.6)

## 2016-12-29 LAB — PSA: Prostatic Specific Antigen: 2.53 ng/mL (ref 0.00–4.00)

## 2016-12-29 MED ORDER — LEUPROLIDE ACETATE (3 MONTH) 22.5 MG IM KIT
22.5000 mg | PACK | Freq: Once | INTRAMUSCULAR | Status: AC
  Administered 2016-12-29: 22.5 mg via INTRAMUSCULAR
  Filled 2016-12-29: qty 22.5

## 2016-12-29 NOTE — Assessment & Plan Note (Addendum)
# #  CASTRATE RESISTANT PROSTATE CANCER- Metastatic disease. PSA aug 10th 2018- 5.37. Re-started on Lupron 22.5 on 8/15 /2018.  OCT 30th PET scan-  Pelvic/RP LN; left later bladder wall- possibly leading to left ureter obstruction/hydronephrosis [C discussion below]  # Continue X-tandi [3 pills/day-can reduce dose given his age]-; tolerating well except for mild fatigue.  # Left hydronephrosis/? Extrinsic mass pushing on Left ureters s/p stenting.Renal function improved.   s/p # Osteoporosis- Reviewed the bone density from 2015 - Severe osteoporosis. on Prolia q 6 M- last 10/03/2016. Continue every 6 months.   # Iron deficiency anemia- question related to urinary blood loss. By mouth iron tolerating well. Hemoglobin 12.   # follow up in 6 weeks/labs- PSA; [will need 3 months/PSA;Labs;Lupron; Prolia-feb 2019.]  # I reviewed the blood work- with the patient in detail; also reviewed the imaging independently [as summarized above]; and with the patient in detail.

## 2016-12-29 NOTE — Progress Notes (Signed)
[place] [facility] OFFICE PROGRESS NOTE  Patient Care Team: Benjamin Carbon, MD as PCP - General   SUMMARY OF ONCOLOGIC HISTORY: Oncology History    # 1996 PROSTATE CANCER [Gleason score 2+3] s/p Radical Prostatectomy [[facility]]; ?Biochem RECURRENT PROSTATE CANCER-Hormone sensitive;  Intermittent Lupron; on Lupron q 80M [Jan 2016 PSA- 7.9].  HOLD LUPRON in MAY 2017; AUg 2017- Re-start  # Osteoporosis [BMD- 2015]- on Reclast q 45M; FEB 2017- Start Prolia q 6 M     Prostate cancer ([place])   03/22/2009 Initial Diagnosis    Prostate cancer ([place])      INTERVAL HISTORY:  A very pleasant 83 year old male patient with above history of  Castrate resistant recurrent prostate cancer- on Lupron Recently underwent cystoscopy with stent placement for his left hydronephrosis. It is suspected that he has extrinsic compression at the level of the ureters causing this left hydronephrosis. He is here to discuss the results of the auximin PEt scan.  Patient complains of mild fatigue otherwise denies any headaches or seizure-like episodes. He is currently taking 3 pills of Xtandi.  Patient denies any new onset of abdominal pain nausea vomiting or diarrhea.   REVIEW OF SYSTEMS:  A complete 10 point review of system is done which is negative except mentioned above/history of present illness.   PAST MEDICAL HISTORY :  Past Medical History:  Diagnosis Date  . Aortic atherosclerosis ([place])    Noted on CT  . Arthritis   . Colon polyps    adenomatous  . Dysrhythmia    pt unaware of this  . History of fracture of tibia   . History of tachycardia    patient unaware of this  . Kyphosis of cervicothoracic region    SEVERE  . Osteoporosis 01/2014   T -3.8 (01/2014)  . Other constipation    chronic  . Prostate cancer ([place])    surgery then lupron  . Unspecified venous (peripheral) insufficiency   . Vitamin B12 deficiency     PAST SURGICAL HISTORY :   Past Surgical History:  Procedure Laterality  Date  . BASAL CELL CARCINOMA EXCISION  2004   Left side of face  . CATARACT EXTRACTION Bilateral 2000, 2003   both eyes  . COLONOSCOPY     h/o adenomatous polyps  . JOINT REPLACEMENT  2005   Left knee  . JOINT REPLACEMENT  12/13   Right knee  . PROSTATE SURGERY  1996   cancer  . sigmoid resection    . TIBIA FRACTURE SURGERY  1999  . VOLVULUS REDUCTION  12/10    FAMILY HISTORY :   Family History  Problem Relation Age of Onset  . Diabetes Son   . Hypertension Paternal Grandfather   . Prostate cancer Neg Hx   . Bladder Cancer Neg Hx   . Kidney cancer Neg Hx     SOCIAL HISTORY:   Social History   Tobacco Use  . Smoking status: Never Smoker  . Smokeless tobacco: Never Used  Substance Use Topics  . Alcohol use: Yes    Alcohol/week: 0.0 - 0.6 oz    Comment: in past  . Drug use: No    ALLERGIES:  is allergic to other.  MEDICATIONS:  Current Outpatient Medications  Medication Sig Dispense Refill  . aspirin 81 MG EC tablet Take 81 mg by mouth every other day.     . Biotin 2.5 MG CAPS Take by mouth.    . bismuth subsalicylate (PEPTO BISMOL) 262 MG/15ML suspension  Take 10 mLs by mouth 4 (four) times daily -  before meals and at bedtime. Takes 2 1/2 times a day    . Calcium Carbonate-Vitamin D (CALCIUM-D) 600-400 MG-UNIT TABS Take 1 tablet by mouth daily.     . carbamide peroxide (DEBROX) 6.5 % OTIC solution 5 drops as needed (ear wax impaction).    . cholecalciferol (VITAMIN D) 1000 UNITS tablet Take 1,000 Units by mouth 2 (two) times daily.     . cyclobenzaprine (FLEXERIL) 5 MG tablet Take 5 mg by mouth at bedtime as needed for muscle spasms.    . enzalutamide (XTANDI) 40 MG capsule Take 3 capsules (120 mg total) by mouth daily. 90 capsule 4  . fluticasone (FLONASE) 50 MCG/ACT nasal spray USE 2 SPRAYS IN EACH NOSTRIL DAILY 48 g 3  . ibuprofen (ADVIL,MOTRIN) 200 MG tablet Take 400 mg by mouth daily as needed.    . latanoprost (XALATAN) 0.005 % ophthalmic solution Place 1  drop into both eyes at bedtime.    Marland Kitchen leuprolide (LUPRON) 30 MG injection Inject 30 mg into the muscle every 4 (four) months. Dose and interval not clear yet    . loratadine (CLARITIN) 10 MG tablet TAKE 1 TABLET DAILY (Patient taking differently: 2 tabs hs) 90 tablet 3  . nystatin (NYSTATIN) powder Apply topically 2 (two) times daily as needed (to groin and abd fold areas as needed for rash).    . polyethylene glycol powder (GLYCOLAX/MIRALAX) powder FILL TO LINE (59 GRAMS ), MIX AND DRINK TWICE A DAY AND 8.5 GRAMS AT NOON 510 g 3  . sennosides-docusate sodium (SENOKOT-S) 8.6-50 MG tablet Take 4 tablets by mouth daily.     . Skin Protectants, Misc. (EUCERIN) cream Apply 1 application topically as needed for dry skin.    Marland Kitchen traMADol (ULTRAM) 50 MG tablet Take 1 tablet (50 mg total) by mouth 3 (three) times daily as needed. (Patient taking differently: Take 50 mg by mouth every 6 (six) hours as needed. ) 270 tablet 0  . triamcinolone cream (KENALOG) 0.1 % Apply 1 application topically every other day. Alternating with Ciclopirox    . vitamin B-12 (CYANOCOBALAMIN) 1000 MCG tablet Take 1 tablet (1,000 mcg total) by mouth daily. 90 tablet 3  . vitamin C (ASCORBIC ACID) 500 MG tablet Take 500 mg by mouth daily.    . zoledronic acid (RECLAST) 5 MG/100ML SOLN injection Inject 5 mg into the vein every 6 (six) months.     . clindamycin (CLEOCIN) 150 MG capsule Take 150 mg by mouth. Take 4 capsules prior to dental work    . diphenhydrAMINE (BENADRYL) 25 mg capsule Take 25 mg by mouth every 4 (four) hours as needed for itching or allergies.     No current facility-administered medications for this visit.    Facility-Administered Medications Ordered in Other Visits  Medication Dose Route Frequency Provider Last Rate Last Dose  . leuprolide (LUPRON) injection 30 mg  30 mg Intramuscular Once Benjamin Alf, MD        PHYSICAL EXAMINATION: ECOG PERFORMANCE STATUS: 1 - Symptomatic but completely ambulatory  BP  118/61 (BP Location: Left Arm, Patient Position: Sitting)   Pulse 66   Temp 97.8 F (36.6 C) (Tympanic)   Resp 20   Ht 6' 2.5" (1.892 m)   Wt 178 lb 6.4 oz (80.9 kg)   BMI 22.60 kg/m   Filed Weights   12/29/16 1021  Weight: 178 lb 6.4 oz (80.9 kg)    GENERAL: Well-nourished well-developed; Alert,  no distress and comfortable.  With his daughter; walks with a rolling walker EYES: no pallor or icterus OROPHARYNX: no thrush or ulceration; good dentition  NECK: supple, no masses felt LYMPH:  no palpable lymphadenopathy in the cervical, axillary or inguinal regions LUNGS: clear to auscultation and  No wheeze or crackles HEART/CVS: regular rate & rhythm and no murmurs; No lower extremity edema ABDOMEN:abdomen soft, non-tender and normal bowel sounds Musculoskeletal:no cyanosis of digits and no clubbing  PSYCH: alert & oriented x 3 with fluent speech NEURO: no focal motor/sensory deficits SKIN:  no rashes or significant lesions  LABORATORY DATA:  I have reviewed the data as listed    Component Value Date/Time   NA 136 12/29/2016 0947   NA 140 02/11/2012 0415   K 4.1 12/29/2016 0947   K 3.9 02/11/2012 0415   CL 106 12/29/2016 0947   CL 108 (H) 02/11/2012 0415   CO2 21 (L) 12/29/2016 0947   CO2 25 02/11/2012 0415   GLUCOSE 155 (H) 12/29/2016 0947   GLUCOSE 125 (H) 02/11/2012 0415   BUN 21 (H) 12/29/2016 0947   BUN 12 02/11/2012 0415   CREATININE 0.88 12/29/2016 0947   CREATININE 1.00 03/23/2014 1400   CALCIUM 9.1 12/29/2016 0947   CALCIUM 8.3 (L) 03/23/2014 1400   PROT 7.3 12/29/2016 0947   PROT 7.0 03/07/2014 1414   ALBUMIN 4.1 12/29/2016 0947   ALBUMIN 3.7 03/07/2014 1414   AST 27 12/29/2016 0947   AST 16 03/07/2014 1414   ALT 14 (L) 12/29/2016 0947   ALT 19 03/07/2014 1414   ALKPHOS 48 12/29/2016 0947   ALKPHOS 69 03/07/2014 1414   BILITOT 0.6 12/29/2016 0947   BILITOT 0.3 03/07/2014 1414   GFRNONAA >60 12/29/2016 0947   GFRNONAA >60 03/23/2014 1400   GFRNONAA  49 (L) 09/02/2013 1428   GFRAA >60 12/29/2016 0947   GFRAA >60 03/23/2014 1400   GFRAA 57 (L) 09/02/2013 1428    No results found for: SPEP, UPEP  Lab Results  Component Value Date   WBC 5.7 12/29/2016   NEUTROABS 3.1 12/29/2016   HGB 12.2 (L) 12/29/2016   HCT 35.7 (L) 12/29/2016   MCV 90.6 12/29/2016   PLT 132 (L) 12/29/2016      Chemistry      Component Value Date/Time   NA 136 12/29/2016 0947   NA 140 02/11/2012 0415   K 4.1 12/29/2016 0947   K 3.9 02/11/2012 0415   CL 106 12/29/2016 0947   CL 108 (H) 02/11/2012 0415   CO2 21 (L) 12/29/2016 0947   CO2 25 02/11/2012 0415   BUN 21 (H) 12/29/2016 0947   BUN 12 02/11/2012 0415   CREATININE 0.88 12/29/2016 0947   CREATININE 1.00 03/23/2014 1400   GLU 114 06/30/2016      Component Value Date/Time   CALCIUM 9.1 12/29/2016 0947   CALCIUM 8.3 (L) 03/23/2014 1400   ALKPHOS 48 12/29/2016 0947   ALKPHOS 69 03/07/2014 1414   AST 27 12/29/2016 0947   AST 16 03/07/2014 1414   ALT 14 (L) 12/29/2016 0947   ALT 19 03/07/2014 1414   BILITOT 0.6 12/29/2016 0947   BILITOT 0.3 03/07/2014 1414     Results for Benjamin, Villarreal (MRN [id number]) as of 12/29/2016 10:34  Ref. Range 04/04/2016 09:56 07/02/2016 13:44 10/03/2016 13:41 11/03/2016 09:49 12/01/2016 14:12  PSA Latest Ref Range: 0.00 - 4.00 ng/mL 2.23 3.98     Prostatic Specific Antigen Latest Ref Range: 0.00 - 4.00 ng/mL   5.37 (  H) 8.02 (H) 8.99 (H)    IMPRESSION: 1. Radiotracer accumulation with in LEFT periaortic lymph nodes and LEFT common iliac lymph nodes consistent with prostate cancer metastasis. 2. Focal activity along the posterior LEFT aspect of the bladder is concerning for local prostate cancer invasion. 3. No evidence skeletal metastasis.   Electronically Signed   By: Suzy Bouchard M.D.   On: 12/23/2016 15:20     ASSESSMENT & PLAN:   Prostate cancer ([place]) # #CASTRATE RESISTANT PROSTATE CANCER- Metastatic disease. PSA aug 10th 2018- 5.37. Re-started on  Lupron 22.5 on 8/15 /2018.  OCT 30th PET scan-  Pelvic/RP LN; left later bladder wall- possibly leading to left ureter obstruction/hydronephrosis [C discussion below]  # Continue X-tandi [3 pills/day-can reduce dose given his age]-; tolerating well except for mild fatigue.  # Left hydronephrosis/? Extrinsic mass pushing on Left ureters s/p stenting.Renal function improved.   s/p # Osteoporosis- Reviewed the bone density from 2015 - Severe osteoporosis. on Prolia q 6 M- last 10/03/2016. Continue every 6 months.   # Iron deficiency anemia- question related to urinary blood loss. By mouth iron tolerating well. Hemoglobin 12.   # follow up in 6 weeks/labs- PSA; [will need 3 months/PSA;Labs;Lupron; Prolia-feb 2019.]     Cammie Sickle, MD 12/29/2016 12:58 PM

## 2017-01-01 MED FILL — XTANDI 40 MG CAPSULE: 40 | 30 days supply | Qty: 90 | Fill #1

## 2017-01-08 ENCOUNTER — Encounter: Payer: Self-pay | Admitting: Internal Medicine

## 2017-01-08 ENCOUNTER — Ambulatory Visit: Payer: Medicare Other | Admitting: Internal Medicine

## 2017-01-08 VITALS — BP 118/70 | HR 72 | Temp 98.0°F | Resp 16 | Wt 178.0 lb

## 2017-01-08 DIAGNOSIS — I872 Venous insufficiency (chronic) (peripheral): Secondary | ICD-10-CM | POA: Diagnosis not present

## 2017-01-08 DIAGNOSIS — J301 Allergic rhinitis due to pollen: Secondary | ICD-10-CM | POA: Diagnosis not present

## 2017-01-08 DIAGNOSIS — C61 Malignant neoplasm of prostate: Secondary | ICD-10-CM

## 2017-01-08 DIAGNOSIS — G463 Brain stem stroke syndrome: Secondary | ICD-10-CM | POA: Diagnosis not present

## 2017-01-08 DIAGNOSIS — K5909 Other constipation: Secondary | ICD-10-CM

## 2017-01-08 NOTE — Assessment & Plan Note (Signed)
Doing well on the current regimen

## 2017-01-08 NOTE — Assessment & Plan Note (Signed)
PSA spiked so on xtandi Generally tolerating and has responded Continues regular follow up with Dr Burlene Arnt

## 2017-01-08 NOTE — Progress Notes (Signed)
Subjective:    Patient ID: Benjamin Villarreal, male    DOB: 23-Apr-1920, 83 y.o.   MRN: [id number]  HPI Assisted living visit for follow up of his chronic medical conditions Reviewed status with Luellen Pucker RN  Reviewed the oncology notes Now on xtandi and lupron shots Last PSA down  No apparent problems with this--though he may some more fatigue prolia every 6 months  Some drainage and congestion Stopped the flonase---wonders whether he needed this He will try the spray again--mostly post nasal drip in the morning Discussed humidifier now that the heat is back on  Bowels are fine on the miralax and senna No blood   Current Outpatient Medications on File Prior to Visit  Medication Sig Dispense Refill  . aspirin 81 MG EC tablet Take 81 mg by mouth every other day.     . Biotin 2.5 MG CAPS Take by mouth.    . bismuth subsalicylate (PEPTO BISMOL) 262 MG/15ML suspension Take 10 mLs by mouth 4 (four) times daily -  before meals and at bedtime. Takes 2 1/2 times a day    . Calcium Carbonate-Vitamin D (CALCIUM-D) 600-400 MG-UNIT TABS Take 1 tablet by mouth daily.     . carbamide peroxide (DEBROX) 6.5 % OTIC solution 5 drops as needed (ear wax impaction).    . cholecalciferol (VITAMIN D) 1000 UNITS tablet Take 1,000 Units by mouth 2 (two) times daily.     . clindamycin (CLEOCIN) 150 MG capsule Take 150 mg by mouth. Take 4 capsules prior to dental work    . cyclobenzaprine (FLEXERIL) 5 MG tablet Take 5 mg by mouth at bedtime as needed for muscle spasms.    . diphenhydrAMINE (BENADRYL) 25 mg capsule Take 25 mg by mouth every 4 (four) hours as needed for itching or allergies.    . enzalutamide (XTANDI) 40 MG capsule Take 3 capsules (120 mg total) by mouth daily. 90 capsule 4  . fluticasone (FLONASE) 50 MCG/ACT nasal spray USE 2 SPRAYS IN EACH NOSTRIL DAILY 48 g 3  . ibuprofen (ADVIL,MOTRIN) 200 MG tablet Take 400 mg by mouth daily as needed.    . latanoprost (XALATAN) 0.005 % ophthalmic solution Place  1 drop into both eyes at bedtime.    Marland Kitchen leuprolide (LUPRON) 30 MG injection Inject 30 mg into the muscle every 4 (four) months. Dose and interval not clear yet    . loratadine (CLARITIN) 10 MG tablet TAKE 1 TABLET DAILY (Patient taking differently: 2 tabs hs) 90 tablet 3  . nystatin (NYSTATIN) powder Apply topically 2 (two) times daily as needed (to groin and abd fold areas as needed for rash).    . polyethylene glycol powder (GLYCOLAX/MIRALAX) powder FILL TO LINE (78 GRAMS ), MIX AND DRINK TWICE A DAY AND 8.5 GRAMS AT NOON 510 g 3  . sennosides-docusate sodium (SENOKOT-S) 8.6-50 MG tablet Take 4 tablets by mouth daily.     . Skin Protectants, Misc. (EUCERIN) cream Apply 1 application topically as needed for dry skin.    Marland Kitchen traMADol (ULTRAM) 50 MG tablet Take 1 tablet (50 mg total) by mouth 3 (three) times daily as needed. (Patient taking differently: Take 50 mg by mouth every 6 (six) hours as needed. ) 270 tablet 0  . triamcinolone cream (KENALOG) 0.1 % Apply 1 application topically every other day. Alternating with Ciclopirox    . vitamin B-12 (CYANOCOBALAMIN) 1000 MCG tablet Take 1 tablet (1,000 mcg total) by mouth daily. 90 tablet 3  . vitamin C (ASCORBIC ACID)  500 MG tablet Take 500 mg by mouth daily.    . zoledronic acid (RECLAST) 5 MG/100ML SOLN injection Inject 5 mg into the vein every 6 (six) months.      Current Facility-Administered Medications on File Prior to Visit  Medication Dose Route Frequency Provider Last Rate Last Dose  . leuprolide (LUPRON) injection 30 mg  30 mg Intramuscular Once Leia Alf, MD        Allergies  Allergen Reactions  . Other     RAW ONIONS- SINUS REACTION    Past Medical History:  Diagnosis Date  . Aortic atherosclerosis ([place])    Noted on CT  . Arthritis   . Colon polyps    adenomatous  . Dysrhythmia    pt unaware of this  . History of fracture of tibia   . History of tachycardia    patient unaware of this  . Kyphosis of cervicothoracic  region    SEVERE  . Osteoporosis 01/2014   T -3.8 (01/2014)  . Other constipation    chronic  . Prostate cancer ([place])    surgery then lupron  . Unspecified venous (peripheral) insufficiency   . Vitamin B12 deficiency     Past Surgical History:  Procedure Laterality Date  . BASAL CELL CARCINOMA EXCISION  2004   Left side of face  . CATARACT EXTRACTION Bilateral 2000, 2003   both eyes  . COLONOSCOPY     h/o adenomatous polyps  . CYSTOSCOPY W/ RETROGRADES Bilateral 11/19/2016   Procedure: CYSTOSCOPY WITH RETROGRADE PYELOGRAM;  Surgeon: Hollice Espy, MD;  Location: [facility];  Service: Urology;  Laterality: Bilateral;  General vs. Spinal  . CYSTOSCOPY WITH URETEROSCOPY AND STENT PLACEMENT Left 11/19/2016   Procedure: CYSTOSCOPY WITH URETEROSCOPY AND STENT PLACEMENT;  Surgeon: Hollice Espy, MD;  Location: [facility];  Service: Urology;  Laterality: Left;  . JOINT REPLACEMENT  2005   Left knee  . JOINT REPLACEMENT  12/13   Right knee  . PROSTATE SURGERY  1996   cancer  . sigmoid resection    . TIBIA FRACTURE SURGERY  1999  . URETERAL BIOPSY Left 11/19/2016   Procedure: URETERAL BIOPSY;  Surgeon: Hollice Espy, MD;  Location: [facility];  Service: Urology;  Laterality: Left;  Marland Kitchen VOLVULUS REDUCTION  12/10    Family History  Problem Relation Age of Onset  . Diabetes Son   . Hypertension Paternal Grandfather   . Prostate cancer Neg Hx   . Bladder Cancer Neg Hx   . Kidney cancer Neg Hx     Social History   Socioeconomic History  . Marital status: Widowed    Spouse name: [name]  . Number of children: 3  . Years of education: Not on file  . Highest education level: Not on file  Social Needs  . Financial resource strain: Not on file  . Food insecurity - worry: Not on file  . Food insecurity - inability: Not on file  . Transportation needs - medical: Not on file  . Transportation needs - non-medical: Not on file  Occupational History  . Occupation: Research scientist (physical sciences)     Comment: retired  Tobacco Use  . Smoking status: Never Smoker  . Smokeless tobacco: Never Used  Substance and Sexual Activity  . Alcohol use: Yes    Alcohol/week: 0.0 - 0.6 oz    Comment: in past  . Drug use: No  . Sexual activity: Not on file  Other Topics Concern  . Not on file  Social History Narrative  Has living will   Daughter [name] holds health care POA.   Has DNR and we have reviewed this.   Would not want tube feedings if cognitively unaware   Review of Systems Appetite is okay Weight stable Sleeps --nocturia x 1-2  No recent vertigo--has been off the every other day asa lately (his decision) No SOB Gets occasional chest pain---light, mostly when lying down. No heartburn. No palpitations No stomach or abdominal pain Still makes visits--no longer leads services Walks daily--around a  No edema problems--- uses the support socks    Objective:   Physical Exam  Constitutional: He appears well-developed. No distress.  Neck: No thyromegaly present.  Cardiovascular: Normal rate and regular rhythm. Exam reveals no gallop.  Gr 2/6 systolic murmur at apex  Pulmonary/Chest: Effort normal and breath sounds normal. No respiratory distress. He has no wheezes. He has no rales.  Abdominal: Soft. He exhibits no distension. There is no tenderness. There is no rebound and no guarding.  Musculoskeletal: He exhibits no edema or tenderness.  Lymphadenopathy:    He has no cervical adenopathy.  Psychiatric: He has a normal mood and affect. His behavior is normal.          Assessment & Plan:

## 2017-01-08 NOTE — Assessment & Plan Note (Signed)
Benjamin Villarreal wears the support socks

## 2017-01-08 NOTE — Assessment & Plan Note (Signed)
No recent vertigo He has chosen to skip the aspirin for now

## 2017-01-08 NOTE — Assessment & Plan Note (Signed)
Will retry the fluticasone spray

## 2017-01-26 ENCOUNTER — Ambulatory Visit (INDEPENDENT_AMBULATORY_CARE_PROVIDER_SITE_OTHER): Payer: Medicare Other | Admitting: Podiatry

## 2017-01-26 ENCOUNTER — Encounter: Payer: Self-pay | Admitting: Podiatry

## 2017-01-26 ENCOUNTER — Other Ambulatory Visit: Payer: Self-pay | Admitting: Podiatry

## 2017-01-26 ENCOUNTER — Ambulatory Visit (INDEPENDENT_AMBULATORY_CARE_PROVIDER_SITE_OTHER): Payer: Medicare Other

## 2017-01-26 DIAGNOSIS — B351 Tinea unguium: Secondary | ICD-10-CM | POA: Diagnosis not present

## 2017-01-26 DIAGNOSIS — L84 Corns and callosities: Secondary | ICD-10-CM | POA: Diagnosis not present

## 2017-01-26 DIAGNOSIS — S92302A Fracture of unspecified metatarsal bone(s), left foot, initial encounter for closed fracture: Secondary | ICD-10-CM

## 2017-01-26 DIAGNOSIS — M79672 Pain in left foot: Secondary | ICD-10-CM

## 2017-01-26 DIAGNOSIS — S92912A Unspecified fracture of left toe(s), initial encounter for closed fracture: Secondary | ICD-10-CM

## 2017-01-26 DIAGNOSIS — M79609 Pain in unspecified limb: Secondary | ICD-10-CM

## 2017-01-26 NOTE — Progress Notes (Signed)
Complaint:  Visit Type: Patient returns to my office for continued preventative foot care services. Complaint: Patient states" my nails have grown long and thick and become painful to walk and wear shoes" Patient has been diagnosed with neuropathy.. The patient presents for preventative foot care services. No changes to ROS  Podiatric Exam: Vascular: dorsalis pedis and posterior tibial pulses are barely palpable, right foot.  No palpation is noted to the dorsalis pedis and posterior tibial pulses due to swelling, left foot.  Increased temperature noted in the left foot. Sensorium: Diminished  Semmes Weinstein monofilament test. Normal tactile sensation bilaterally. Nail Exam: Pt has thick disfigured discolored nails with subungual debris noted bilateral entire nail hallux through fifth toenails Ulcer Exam: There is no evidence of ulcer or pre-ulcerative changes or infection. Orthopedic Exam: Muscle tone and strength are WNL. No limitations in general ROM. No crepitus or effusions noted. Foot type and digits show no abnormalities.  Rearfoot  DJD noted  B/L.  Marland Kitchen  Patient has swollen increased temperature of left forefoot..  Excessive swelling noted on forefoot.  Palpable pain fourth toe  and  over third metatarsal head left foot. Skin:  Porokeratosis sub 1 right. No infection or ulcers  Diagnosis:  Onychomycosis, , Pain in right toe, pain in left toes Porokeratosis right  Closed fracture left foot.  Treatment & Plan Procedures and Treatment: Consent by patient was obtained for treatment procedures. The patient understood the discussion of treatment and procedures well. All questions were answered thoroughly reviewed. Debridement of mycotic and hypertrophic toenails, 1 through 5 bilateral and clearing of subungual debris. No ulceration, no infection noted. Debride porokeratosis right. During my treatment for his nails. I noted there was significant swelling and increased temperature noted on his left  forefoot  X-rays were taken of his swollen left foot and it was determined that he had a fracture of the fourth digit left foot and a probable cancellous fracture at the head of the third metatarsal left foot.  Significant bony changes noted to the third and fourth digits of the left foot.  Marked osteopenia noted. Discussed this condition with this 83 y.o. Patient.  I told this  patient to wear the anklet on his left foot to help to control the swelling.Marland Kitchen  He was also dispensed a surgical shoe.  Patient was told to return to the office in 2-4 weeks for evaluation of his left foot.  Patient again states he hasno recollection of any injury to his left foot in the last few weeks. Return Visit-Office Procedure: Patient instructed to return to the office for a follow up visit 3 months for preventative foot care services.    Gardiner Barefoot DPM

## 2017-02-04 ENCOUNTER — Other Ambulatory Visit: Payer: Self-pay | Admitting: Internal Medicine

## 2017-02-05 MED FILL — XTANDI 40 MG CAPSULE: 40 | 30 days supply | Qty: 90 | Fill #2

## 2017-02-09 ENCOUNTER — Other Ambulatory Visit: Payer: Self-pay

## 2017-02-09 ENCOUNTER — Encounter: Payer: Self-pay | Admitting: Podiatry

## 2017-02-09 ENCOUNTER — Inpatient Hospital Stay: Payer: Medicare Other

## 2017-02-09 ENCOUNTER — Inpatient Hospital Stay: Payer: Medicare Other | Attending: Internal Medicine | Admitting: Internal Medicine

## 2017-02-09 ENCOUNTER — Ambulatory Visit (INDEPENDENT_AMBULATORY_CARE_PROVIDER_SITE_OTHER): Payer: Medicare Other | Admitting: Podiatry

## 2017-02-09 VITALS — BP 105/67 | HR 76 | Temp 97.8°F | Resp 20 | Wt 179.8 lb

## 2017-02-09 DIAGNOSIS — R000 Tachycardia, unspecified: Secondary | ICD-10-CM | POA: Diagnosis not present

## 2017-02-09 DIAGNOSIS — I700 Atherosclerosis of aorta: Secondary | ICD-10-CM | POA: Diagnosis not present

## 2017-02-09 DIAGNOSIS — Z79899 Other long term (current) drug therapy: Secondary | ICD-10-CM | POA: Diagnosis not present

## 2017-02-09 DIAGNOSIS — Z7982 Long term (current) use of aspirin: Secondary | ICD-10-CM | POA: Diagnosis not present

## 2017-02-09 DIAGNOSIS — D509 Iron deficiency anemia, unspecified: Secondary | ICD-10-CM | POA: Insufficient documentation

## 2017-02-09 DIAGNOSIS — N1330 Unspecified hydronephrosis: Secondary | ICD-10-CM | POA: Diagnosis not present

## 2017-02-09 DIAGNOSIS — C7911 Secondary malignant neoplasm of bladder: Secondary | ICD-10-CM | POA: Diagnosis not present

## 2017-02-09 DIAGNOSIS — Z8781 Personal history of (healed) traumatic fracture: Secondary | ICD-10-CM | POA: Diagnosis not present

## 2017-02-09 DIAGNOSIS — C61 Malignant neoplasm of prostate: Secondary | ICD-10-CM | POA: Diagnosis not present

## 2017-02-09 DIAGNOSIS — M40203 Unspecified kyphosis, cervicothoracic region: Secondary | ICD-10-CM | POA: Diagnosis not present

## 2017-02-09 DIAGNOSIS — M810 Age-related osteoporosis without current pathological fracture: Secondary | ICD-10-CM | POA: Diagnosis not present

## 2017-02-09 DIAGNOSIS — Z85828 Personal history of other malignant neoplasm of skin: Secondary | ICD-10-CM | POA: Diagnosis not present

## 2017-02-09 DIAGNOSIS — S92902A Unspecified fracture of left foot, initial encounter for closed fracture: Secondary | ICD-10-CM

## 2017-02-09 DIAGNOSIS — Z86010 Personal history of colonic polyps: Secondary | ICD-10-CM | POA: Diagnosis not present

## 2017-02-09 DIAGNOSIS — S92912A Unspecified fracture of left toe(s), initial encounter for closed fracture: Secondary | ICD-10-CM

## 2017-02-09 DIAGNOSIS — I872 Venous insufficiency (chronic) (peripheral): Secondary | ICD-10-CM | POA: Insufficient documentation

## 2017-02-09 DIAGNOSIS — E538 Deficiency of other specified B group vitamins: Secondary | ICD-10-CM | POA: Insufficient documentation

## 2017-02-09 LAB — CBC WITH DIFFERENTIAL/PLATELET
BASOS ABS: 0.1 10*3/uL (ref 0–0.1)
BASOS PCT: 2 %
EOS ABS: 0.3 10*3/uL (ref 0–0.7)
EOS PCT: 5 %
HCT: 34.9 % — ABNORMAL LOW (ref 40.0–52.0)
Hemoglobin: 11.8 g/dL — ABNORMAL LOW (ref 13.0–18.0)
Lymphocytes Relative: 35 %
Lymphs Abs: 1.9 10*3/uL (ref 1.0–3.6)
MCH: 30.9 pg (ref 26.0–34.0)
MCHC: 33.9 g/dL (ref 32.0–36.0)
MCV: 91 fL (ref 80.0–100.0)
MONO ABS: 0.5 10*3/uL (ref 0.2–1.0)
Monocytes Relative: 9 %
Neutro Abs: 2.8 10*3/uL (ref 1.4–6.5)
Neutrophils Relative %: 49 %
PLATELETS: 137 10*3/uL — AB (ref 150–440)
RBC: 3.84 MIL/uL — AB (ref 4.40–5.90)
RDW: 14.1 % (ref 11.5–14.5)
WBC: 5.7 10*3/uL (ref 3.8–10.6)

## 2017-02-09 LAB — COMPREHENSIVE METABOLIC PANEL
ALBUMIN: 3.9 g/dL (ref 3.5–5.0)
ALT: 12 U/L — AB (ref 17–63)
AST: 25 U/L (ref 15–41)
Alkaline Phosphatase: 46 U/L (ref 38–126)
Anion gap: 10 (ref 5–15)
BUN: 23 mg/dL — AB (ref 6–20)
CHLORIDE: 103 mmol/L (ref 101–111)
CO2: 20 mmol/L — AB (ref 22–32)
Calcium: 8.6 mg/dL — ABNORMAL LOW (ref 8.9–10.3)
Creatinine, Ser: 1.01 mg/dL (ref 0.61–1.24)
GFR calc Af Amer: >60 mL/min (ref >60)
Glucose, Bld: 178 mg/dL — ABNORMAL HIGH (ref 65–99)
POTASSIUM: 4.2 mmol/L (ref 3.5–5.1)
SODIUM: 133 mmol/L — AB (ref 135–145)
Total Bilirubin: 0.5 mg/dL (ref 0.3–1.2)
Total Protein: 7 g/dL (ref 6.5–8.1)

## 2017-02-09 LAB — PSA: PROSTATIC SPECIFIC ANTIGEN: 3.68 ng/mL (ref 0.00–4.00)

## 2017-02-09 NOTE — Assessment & Plan Note (Addendum)
# #  CASTRATE RESISTANT PROSTATE CANCER- Metastatic disease. PSA aug 10th 2018- 5.37. Re-started on Lupron 22.5 on 8/15 /2018.  OCT 30th PET scan-  Pelvic/RP LN; left later bladder wall- possibly leading to left ureter obstruction/hydronephrosis [C discussion below]  # Continue X-tandi [3 pills/day-can reduce dose given his age]-; tolerating well except for mild fatigue. PSA improving.   #Discussed the treatments are palliative; and not curative.  Also discussed that the median the life expectancy of castrate resistant prostate cancer is approximately 2 years.  # Left hydronephrosis/? Extrinsic mass pushing on Left ureters s/p stenting. Intermittent/ Left lower quad pain/ awaiting urology evaluation tomorrow.   # Severe Osteoporosis-  Continue Prolia every 6 months.  Technically patient would be a candidate for Xgeva every month.  Will discuss at next visit.  # Iron deficiency anemia- question related to urinary blood loss. By mouth iron tolerating well. Hemoglobin 11.8.   # follow up in Start of Feb 2019; lupron;Prolia. Labs- cbc/cmp/psa.   Addendum:  PSA is stable/improved from 3 months ago; however today-PSA is 3.68; which is slightly increased from a month ago.  No new recommendations continue Xtandi at this time.  Patient will be informed.

## 2017-02-09 NOTE — Progress Notes (Signed)
This patient presents to the office for continued evaluation of a diagnosed closed fracture of his left foot.  He has been walking with a surgical shoed wearing an anklet on hling.  He says that the foot is doing welland is not experiencing any pain.  He says there is still significant swelling.  He presents the office today for continued evaluation and treatment of his left foot.   General Appearance  Alert, conversant and in no acute stress.  Vascular  Dorsalis pedis and posterior pulses are palpable  bilaterally.  Capillary return is within normal limits  Bilaterally. Temperature is within normal limits  Bilaterally  Neurologic  Senn-Weinstein monofilament wire test within normal limits  bilaterally. Muscle power  Within normal limits bilaterally.  Nails Thick disfigured discolored nails with subungual debride bilaterally from hallux to fifth toes bilaterally. No evidence of bacterial infection or drainage bilaterally.  Orthopedic  No limitations of motion of motion feet bilaterally.  No crepitus or effusions noted.  No bony pathology or digital deformities noted.rear foot DJD noted bilaterally.  Continued swelling over the left forefoot, but the swelling has diminished and the temperature has resolved.  No palpable pain persisting on the fourth toe and the third metatarsal head left foot.  Skin  normotropic skin with no porokeratosis noted bilaterally.  No signs of infections or ulcers noted.     Closed fracture left foot.   ROV  Patient was told to continue to use the anklet until he returns.  Told this patient that he can return to his regular footgear in 2 weeks and then let pain be his guide.  Return to clinic in 4 weeks for a follow.   Gardiner Barefoot DPM

## 2017-02-09 NOTE — Progress Notes (Signed)
[facility] OFFICE PROGRESS NOTE  Patient Care Team: Venia Carbon, MD as PCP - General   SUMMARY OF ONCOLOGIC HISTORY: Oncology History    # 1996 PROSTATE CANCER [Gleason score 2+3] s/p Radical Prostatectomy [[facility]]; ?Biochem RECURRENT PROSTATE CANCER-Hormone sensitive;  Intermittent Lupron; on Lupron q 29M [Jan 2016 PSA- 7.9].  HOLD LUPRON in MAY 2017; AUg 2017- Re-start  # OCT 2018- Xtandi 3 pills a day; OCT 30th PET- Retroperitoneal/pelvic adenopathy/recurrence lateral bladder wall; NO Bone lesions.   # Osteoporosis [BMD- 5462]- on Reclast q 11M; FEB 2017- Start Prolia q 6 M     Prostate cancer ([place])    INTERVAL HISTORY:  A very pleasant 83 year old male patient with above history of  Castrate resistant recurrent prostate cancer- on Lupron; and also [name] [since October 2018] is here for follow-up.  Accompanied by 2 daughters.   Patient complains of mild fatigue otherwise denies any headaches or seizure-like episodes. He is currently taking 3 pills of Xtandi.  He notes to have intermittent pain in his left flank which improves with urination.  Denies any burning pain.  No fever or chills.  He is awaiting to have urology evaluation tomorrow.  Patient denies any new onset of abdominal pain nausea vomiting or diarrhea.   REVIEW OF SYSTEMS:  A complete 10 point review of system is done which is negative except mentioned above/history of present illness.   PAST MEDICAL HISTORY :  Past Medical History:  Diagnosis Date  . Aortic atherosclerosis ([place])    Noted on CT  . Arthritis   . Colon polyps    adenomatous  . Dysrhythmia    pt unaware of this  . History of fracture of tibia   . History of tachycardia    patient unaware of this  . Kyphosis of cervicothoracic region    SEVERE  . Osteoporosis 01/2014   T -3.8 (01/2014)  . Other constipation    chronic  . Prostate cancer ([place])    surgery then lupron  . Unspecified venous (peripheral) insufficiency   .  Vitamin B12 deficiency     PAST SURGICAL HISTORY :   Past Surgical History:  Procedure Laterality Date  . BASAL CELL CARCINOMA EXCISION  2004   Left side of face  . CATARACT EXTRACTION Bilateral 2000, 2003   both eyes  . COLONOSCOPY     h/o adenomatous polyps  . CYSTOSCOPY W/ RETROGRADES Bilateral 11/19/2016   Procedure: CYSTOSCOPY WITH RETROGRADE PYELOGRAM;  Surgeon: Hollice Espy, MD;  Location: [facility] ORS;  Service: Urology;  Laterality: Bilateral;  General vs. Spinal  . CYSTOSCOPY WITH URETEROSCOPY AND STENT PLACEMENT Left 11/19/2016   Procedure: CYSTOSCOPY WITH URETEROSCOPY AND STENT PLACEMENT;  Surgeon: Hollice Espy, MD;  Location: [facility] ORS;  Service: Urology;  Laterality: Left;  . JOINT REPLACEMENT  2005   Left knee  . JOINT REPLACEMENT  12/13   Right knee  . PROSTATE SURGERY  1996   cancer  . sigmoid resection    . TIBIA FRACTURE SURGERY  1999  . URETERAL BIOPSY Left 11/19/2016   Procedure: URETERAL BIOPSY;  Surgeon: Hollice Espy, MD;  Location: [facility] ORS;  Service: Urology;  Laterality: Left;  Marland Kitchen VOLVULUS REDUCTION  12/10    FAMILY HISTORY :   Family History  Problem Relation Age of Onset  . Diabetes Son   . Hypertension Paternal Grandfather   . Prostate cancer Neg Hx   . Bladder Cancer Neg Hx   . Kidney cancer Neg Hx  SOCIAL HISTORY:   Social History   Tobacco Use  . Smoking status: Never Smoker  . Smokeless tobacco: Never Used  Substance Use Topics  . Alcohol use: Yes    Alcohol/week: 0.0 - 0.6 oz    Comment: in past  . Drug use: No    ALLERGIES:  is allergic to other.  MEDICATIONS:  Current Outpatient Medications  Medication Sig Dispense Refill  . Biotin 2.5 MG CAPS Take 1 capsule by mouth daily.     . Calcium Carbonate-Vitamin D (CALCIUM-D) 600-400 MG-UNIT TABS Take 1 tablet by mouth daily.     . cholecalciferol (VITAMIN D) 1000 UNITS tablet Take 1,000 Units by mouth 2 (two) times daily.     . enzalutamide (XTANDI) 40 MG capsule Take 3  capsules (120 mg total) by mouth daily. 90 capsule 4  . ferrous sulfate 325 (65 FE) MG tablet Take 325 mg by mouth 2 (two) times daily with a meal.    . latanoprost (XALATAN) 0.005 % ophthalmic solution Place 1 drop into both eyes at bedtime.    Marland Kitchen leuprolide (LUPRON) 30 MG injection Inject 30 mg into the muscle every 4 (four) months. Dose and interval not clear yet    . loratadine (CLARITIN) 10 MG tablet TAKE 1 TABLET DAILY 90 tablet 3  . nystatin (NYSTATIN) powder Apply topically 2 (two) times daily as needed (to groin and abd fold areas as needed for rash).    . polyethylene glycol powder (GLYCOLAX/MIRALAX) powder FILL TO LINE (40 GRAMS ), MIX AND DRINK TWICE A DAY AND 8.5 GRAMS AT NOON 510 g 3  . sennosides-docusate sodium (SENOKOT-S) 8.6-50 MG tablet Take 4 tablets by mouth daily.     . vitamin B-12 (CYANOCOBALAMIN) 1000 MCG tablet Take 1 tablet (1,000 mcg total) by mouth daily. 90 tablet 3  . vitamin C (ASCORBIC ACID) 500 MG tablet Take 500 mg by mouth daily.    . zoledronic acid (RECLAST) 5 MG/100ML SOLN injection Inject 5 mg into the vein every 6 (six) months.     Marland Kitchen aspirin 81 MG EC tablet Take 81 mg by mouth every other day.     . bismuth subsalicylate (PEPTO BISMOL) 262 MG/15ML suspension Take 10 mLs by mouth 4 (four) times daily -  before meals and at bedtime. Takes 2 1/2 times a day    . clindamycin (CLEOCIN) 150 MG capsule Take 150 mg by mouth. Take 4 capsules prior to dental work    . cyclobenzaprine (FLEXERIL) 5 MG tablet Take 5 mg by mouth at bedtime as needed for muscle spasms.    . diphenhydrAMINE (BENADRYL) 25 mg capsule Take 25 mg by mouth every 4 (four) hours as needed for itching or allergies.    Marland Kitchen traMADol (ULTRAM) 50 MG tablet Take 1 tablet (50 mg total) by mouth 3 (three) times daily as needed. (Patient not taking: Reported on 02/09/2017) 270 tablet 0  . triamcinolone cream (KENALOG) 0.1 % Apply 1 application topically every other day. Alternating with Ciclopirox     No  current facility-administered medications for this visit.    Facility-Administered Medications Ordered in Other Visits  Medication Dose Route Frequency Provider Last Rate Last Dose  . leuprolide (LUPRON) injection 30 mg  30 mg Intramuscular Once Leia Alf, MD        PHYSICAL EXAMINATION: ECOG PERFORMANCE STATUS: 1 - Symptomatic but completely ambulatory  BP 105/67   Pulse 76   Temp 97.8 F (36.6 C) (Tympanic)   Resp 20  Wt 179 lb 12.8 oz (81.6 kg)   BMI 22.78 kg/m   Filed Weights   02/09/17 1446  Weight: 179 lb 12.8 oz (81.6 kg)    GENERAL: Well-nourished well-developed; Alert, no distress and comfortable.  With his daughter; walks with a rolling walker EYES: no pallor or icterus OROPHARYNX: no thrush or ulceration; good dentition  NECK: supple, no masses felt LYMPH:  no palpable lymphadenopathy in the cervical, axillary or inguinal regions LUNGS: clear to auscultation and  No wheeze or crackles HEART/CVS: regular rate & rhythm and no murmurs; No lower extremity edema ABDOMEN:abdomen soft, non-tender and normal bowel sounds Musculoskeletal:no cyanosis of digits and no clubbing  PSYCH: alert & oriented x 3 with fluent speech NEURO: no focal motor/sensory deficits SKIN:  no rashes or significant lesions  LABORATORY DATA:  I have reviewed the data as listed    Component Value Date/Time   NA 133 (L) 02/09/2017 1355   NA 140 02/11/2012 0415   K 4.2 02/09/2017 1355   K 3.9 02/11/2012 0415   CL 103 02/09/2017 1355   CL 108 (H) 02/11/2012 0415   CO2 20 (L) 02/09/2017 1355   CO2 25 02/11/2012 0415   GLUCOSE 178 (H) 02/09/2017 1355   GLUCOSE 125 (H) 02/11/2012 0415   BUN 23 (H) 02/09/2017 1355   BUN 12 02/11/2012 0415   CREATININE 1.01 02/09/2017 1355   CREATININE 1.00 03/23/2014 1400   CALCIUM 8.6 (L) 02/09/2017 1355   CALCIUM 8.3 (L) 03/23/2014 1400   PROT 7.0 02/09/2017 1355   PROT 7.0 03/07/2014 1414   ALBUMIN 3.9 02/09/2017 1355   ALBUMIN 3.7 03/07/2014  1414   AST 25 02/09/2017 1355   AST 16 03/07/2014 1414   ALT 12 (L) 02/09/2017 1355   ALT 19 03/07/2014 1414   ALKPHOS 46 02/09/2017 1355   ALKPHOS 69 03/07/2014 1414   BILITOT 0.5 02/09/2017 1355   BILITOT 0.3 03/07/2014 1414   GFRNONAA >60 02/09/2017 1355   GFRNONAA >60 03/23/2014 1400   GFRNONAA 49 (L) 09/02/2013 1428   GFRAA >60 02/09/2017 1355   GFRAA >60 03/23/2014 1400   GFRAA 57 (L) 09/02/2013 1428    No results found for: SPEP, UPEP  Lab Results  Component Value Date   WBC 5.7 02/09/2017   NEUTROABS 2.8 02/09/2017   HGB 11.8 (L) 02/09/2017   HCT 34.9 (L) 02/09/2017   MCV 91.0 02/09/2017   PLT 137 (L) 02/09/2017      Chemistry      Component Value Date/Time   NA 133 (L) 02/09/2017 1355   NA 140 02/11/2012 0415   K 4.2 02/09/2017 1355   K 3.9 02/11/2012 0415   CL 103 02/09/2017 1355   CL 108 (H) 02/11/2012 0415   CO2 20 (L) 02/09/2017 1355   CO2 25 02/11/2012 0415   BUN 23 (H) 02/09/2017 1355   BUN 12 02/11/2012 0415   CREATININE 1.01 02/09/2017 1355   CREATININE 1.00 03/23/2014 1400   GLU 114 06/30/2016      Component Value Date/Time   CALCIUM 8.6 (L) 02/09/2017 1355   CALCIUM 8.3 (L) 03/23/2014 1400   ALKPHOS 46 02/09/2017 1355   ALKPHOS 69 03/07/2014 1414   AST 25 02/09/2017 1355   AST 16 03/07/2014 1414   ALT 12 (L) 02/09/2017 1355   ALT 19 03/07/2014 1414   BILITOT 0.5 02/09/2017 1355   BILITOT 0.3 03/07/2014 1414     Results for RENATO, SPELLMAN (MRN [id number]) as of 12/29/2016 10:34  Ref.  Range 04/04/2016 09:56 07/02/2016 13:44 10/03/2016 13:41 11/03/2016 09:49 12/01/2016 14:12  PSA Latest Ref Range: 0.00 - 4.00 ng/mL 2.23 3.98     Prostatic Specific Antigen Latest Ref Range: 0.00 - 4.00 ng/mL   5.37 (H) 8.02 (H) 8.99 (H)    IMPRESSION: 1. Radiotracer accumulation with in LEFT periaortic lymph nodes and LEFT common iliac lymph nodes consistent with prostate cancer metastasis. 2. Focal activity along the posterior LEFT aspect of the bladder  is concerning for local prostate cancer invasion. 3. No evidence skeletal metastasis.   Electronically Signed   By: Suzy Bouchard M.D.   On: 12/23/2016 15:20   Results for RAYLYN, SPECKMAN (MRN [id number]) as of 02/09/2017 15:11  Ref. Range 09/10/2009 10:18 12/11/2009 10:02 03/13/2010 15:19 06/12/2010 14:49 09/11/2010 14:53 12/11/2010 14:13 03/12/2011 14:15 06/11/2011 10:02 09/10/2011 14:18 12/10/2011 10:09 03/31/2012 15:09 06/30/2012 14:41 09/01/2012 14:23 12/02/2012 14:49 03/04/2013 15:44 06/02/2013 09:58 09/02/2013 14:28 12/05/2013 14:27 03/07/2014 14:14 07/25/2014 09:57 11/20/2014 10:35 03/28/2015 10:10 06/25/2015 10:10 10/03/2015 10:22 01/03/2016 09:50 04/04/2016 09:56 07/02/2016 13:44 10/03/2016 13:41 11/03/2016 09:49 12/01/2016 14:12 12/29/2016 09:38  PSA Latest Ref Range: 0.00 - 4.00 ng/mL ========== TEST N... ========== TEST N... ========== TEST N... ========== TEST N... 0.7 0.9 1.1 1.2 1.4 1.2 1.3 1.4 1.7 1.9 2.2 2.6 3.4 5.4 (H) 7.9 (H) 3.24 3.33 3.20 2.50 2.19 2.35 2.23 3.98      Prostatic Specific Antigen Latest Ref Range: 0.00 - 4.00 ng/mL                            5.37 (H) 8.02 (H) 8.99 (H) 2.53    ASSESSMENT & PLAN:   Prostate cancer ([place]) # #CASTRATE RESISTANT PROSTATE CANCER- Metastatic disease. PSA aug 10th 2018- 5.37. Re-started on Lupron 22.5 on 8/15 /2018.  OCT 30th PET scan-  Pelvic/RP LN; left later bladder wall- possibly leading to left ureter obstruction/hydronephrosis [C discussion below]  # Continue X-tandi [3 pills/day-can reduce dose given his age]-; tolerating well except for mild fatigue. PSA improving.   #Discussed the treatments are palliative; and not curative.  Also discussed that the median the life expectancy of castrate resistant prostate cancer is approximately 2 years.  # Left hydronephrosis/? Extrinsic mass pushing on Left ureters s/p stenting. Intermittent/ Left lower quad pain/ awaiting urology evaluation tomorrow.   # Severe Osteoporosis-  Continue every 6 months.   # Iron  deficiency anemia- question related to urinary blood loss. By mouth iron tolerating well. Hemoglobin 11.8.   # follow up in Start of Feb 2019; lupron;Prolia. Labs- cbc/cmp/psa.      Cammie Sickle, MD 02/10/2017 8:35 AM

## 2017-02-10 ENCOUNTER — Encounter: Payer: Self-pay | Admitting: Urology

## 2017-02-10 ENCOUNTER — Ambulatory Visit (INDEPENDENT_AMBULATORY_CARE_PROVIDER_SITE_OTHER): Payer: Medicare Other | Admitting: Urology

## 2017-02-10 ENCOUNTER — Other Ambulatory Visit: Payer: Self-pay | Admitting: Radiology

## 2017-02-10 VITALS — BP 139/64 | HR 68 | Ht 74.0 in | Wt 167.0 lb

## 2017-02-10 DIAGNOSIS — N1330 Unspecified hydronephrosis: Secondary | ICD-10-CM

## 2017-02-10 DIAGNOSIS — Z01818 Encounter for other preprocedural examination: Secondary | ICD-10-CM | POA: Diagnosis not present

## 2017-02-10 DIAGNOSIS — C61 Malignant neoplasm of prostate: Secondary | ICD-10-CM

## 2017-02-10 NOTE — Progress Notes (Signed)
02/10/2017 12:32 PM   Benjamin Villarreal 07-17-1920 [id number]  Referring provider: Venia Carbon, MD [address] [place], [place] [address]  Chief Complaint  Patient presents with  . Hydronephrosis    discuss surgery    HPI: 83 year old male with a history of castrate resistant prostate cancer managed by Dr. Rogue Bussing found to have a 1.9 cm left UVJ mass.  He was taken to the operating room on 10/2016 for further evaluation at which time it was noted to be extrinsic to the bladder and ureter.  Ureteral stent was placed.  Follow-up CT scan on 11/2016 showed a decompressed left collecting system with a stent coiled in the proximal ureter.  He is now back on Lupron as well as Xtandi.  He has had PSA response to this medication.  He returns today to discuss management of his indwelling ureteral stent.  He denies any significant associated urinary symptoms of the stent.  No gross hematuria, urgency, frequency, or any other symptoms.  He does have occasional mild left flank pressure when he voids, otherwise no complaints.   PMH: Past Medical History:  Diagnosis Date  . Aortic atherosclerosis ([place])    Noted on CT  . Arthritis   . Colon polyps    adenomatous  . Dysrhythmia    pt unaware of this  . History of fracture of tibia   . History of tachycardia    patient unaware of this  . Kyphosis of cervicothoracic region    SEVERE  . Osteoporosis 01/2014   T -3.8 (01/2014)  . Other constipation    chronic  . Prostate cancer ([place])    surgery then lupron  . Unspecified venous (peripheral) insufficiency   . Vitamin B12 deficiency     Surgical History: Past Surgical History:  Procedure Laterality Date  . BASAL CELL CARCINOMA EXCISION  2004   Left side of face  . CATARACT EXTRACTION Bilateral 2000, 2003   both eyes  . COLONOSCOPY     h/o adenomatous polyps  . CYSTOSCOPY W/ RETROGRADES Bilateral 11/19/2016   Procedure: CYSTOSCOPY WITH RETROGRADE PYELOGRAM;  Surgeon:  Hollice Espy, MD;  Location: [facility];  Service: Urology;  Laterality: Bilateral;  General vs. Spinal  . CYSTOSCOPY WITH URETEROSCOPY AND STENT PLACEMENT Left 11/19/2016   Procedure: CYSTOSCOPY WITH URETEROSCOPY AND STENT PLACEMENT;  Surgeon: Hollice Espy, MD;  Location: [facility];  Service: Urology;  Laterality: Left;  . FRACTURE SURGERY    . JOINT REPLACEMENT  2005   Left knee  . JOINT REPLACEMENT  12/13   Right knee  . PROSTATE SURGERY  1996   cancer  . sigmoid resection    . TIBIA FRACTURE SURGERY  1999  . URETERAL BIOPSY Left 11/19/2016   Procedure: URETERAL BIOPSY;  Surgeon: Hollice Espy, MD;  Location: [facility];  Service: Urology;  Laterality: Left;  Marland Kitchen VOLVULUS REDUCTION  12/10    Home Medications:  Allergies as of 02/10/2017      Reactions   Other    RAW ONIONS- SINUS REACTION      Medication List        Accurate as of 02/10/17 11:59 PM. Always use your most recent med list.          aspirin 81 MG EC tablet Take 81 mg by mouth every other day.   Biotin 2.5 MG Caps Take 2.5 mg by mouth daily.   bismuth subsalicylate 557 DU/20UR suspension Commonly known as:  PEPTO BISMOL Take 10 mLs by mouth  4 (four) times daily -  before meals and at bedtime. Takes 2 1/2 times a day   Calcium-D 600-400 MG-UNIT Tabs Take 2 tablets by mouth daily.   cholecalciferol 1000 units tablet Commonly known as:  VITAMIN D Take 1,000 Units by mouth 2 (two) times daily.   clindamycin 150 MG capsule Commonly known as:  CLEOCIN Take 500 mg by mouth See admin instructions. Take 4 capsules prior to dental work   cyclobenzaprine 5 MG tablet Commonly known as:  FLEXERIL Take 5 mg by mouth at bedtime as needed for muscle spasms.   diphenhydrAMINE 25 mg capsule Commonly known as:  BENADRYL Take 25 mg by mouth every 4 (four) hours as needed for itching or allergies.   enzalutamide 40 MG capsule Commonly known as:  XTANDI Take 3 capsules (120 mg total) by mouth daily.   ferrous  sulfate 325 (65 FE) MG tablet Take 325 mg by mouth 2 (two) times daily with a meal.   ibuprofen 200 MG tablet Commonly known as:  ADVIL,MOTRIN Take 400 mg by mouth every 8 (eight) hours as needed for headache or mild pain.   latanoprost 0.005 % ophthalmic solution Commonly known as:  XALATAN Place 1 drop into both eyes at bedtime.   leuprolide 30 MG injection Commonly known as:  LUPRON Inject 30 mg into the muscle every 4 (four) months. Dose and interval not clear yet   loratadine 10 MG tablet Commonly known as:  CLARITIN TAKE 1 TABLET DAILY   nystatin powder Generic drug:  nystatin Apply topically 2 (two) times daily.   polyethylene glycol powder powder Commonly known as:  GLYCOLAX/MIRALAX FILL TO LINE (17 GRAMS ), MIX AND DRINK TWICE A DAY AND 8.5 GRAMS AT NOON   sennosides-docusate sodium 8.6-50 MG tablet Commonly known as:  SENOKOT-S Take 2 tablets by mouth every evening.   traMADol 50 MG tablet Commonly known as:  ULTRAM Take 1 tablet (50 mg total) by mouth 3 (three) times daily as needed.   triamcinolone cream 0.1 % Commonly known as:  KENALOG Apply 1 application topically every other day. Alternating with Ciclopirox   vitamin B-12 1000 MCG tablet Commonly known as:  CYANOCOBALAMIN Take 1,000 mcg by mouth daily.   vitamin B-12 1000 MCG tablet Commonly known as:  CYANOCOBALAMIN Take 1 tablet (1,000 mcg total) by mouth daily.   vitamin C 500 MG tablet Commonly known as:  ASCORBIC ACID Take 500 mg by mouth daily.   vitamin C 500 MG tablet Commonly known as:  ASCORBIC ACID Take 500 mg by mouth daily.   zoledronic acid 5 MG/100ML Soln injection Commonly known as:  RECLAST Inject 5 mg into the vein every 6 (six) months.       Allergies:  Allergies  Allergen Reactions  . Other     RAW ONIONS- SINUS REACTION    Family History: Family History  Problem Relation Age of Onset  . Diabetes Son   . Hypertension Paternal Grandfather   . Prostate cancer  Neg Hx   . Bladder Cancer Neg Hx   . Kidney cancer Neg Hx     Social History:  reports that  has never smoked. he has never used smokeless tobacco. He reports that he drinks alcohol. He reports that he does not use drugs.  ROS: UROLOGY Frequent Urination?: No Hard to postpone urination?: No Burning/pain with urination?: No Get up at night to urinate?: Yes Leakage of urine?: Yes Urine stream starts and stops?: No Trouble starting stream?: No  Do you have to strain to urinate?: No Blood in urine?: No Urinary tract infection?: No Sexually transmitted disease?: No Injury to kidneys or bladder?: No Painful intercourse?: No Weak stream?: No Erection problems?: No Penile pain?: No  Gastrointestinal Nausea?: No Vomiting?: No Indigestion/heartburn?: No Diarrhea?: No Constipation?: No  Constitutional Fever: No Night sweats?: No Weight loss?: No Fatigue?: Yes  Skin Skin rash/lesions?: No Itching?: No  Eyes Blurred vision?: No Double vision?: No  Ears/Nose/Throat Sore throat?: No Sinus problems?: No  Hematologic/Lymphatic Swollen glands?: No Easy bruising?: No  Cardiovascular Leg swelling?: No Chest pain?: No  Respiratory Cough?: No Shortness of breath?: No  Endocrine Excessive thirst?: No  Musculoskeletal Back pain?: No Joint pain?: No  Neurological Headaches?: No Dizziness?: No  Psychologic Depression?: No Anxiety?: No  Physical Exam: BP 139/64   Pulse 68   Ht 6\' 2"  (1.88 m)   Wt 167 lb (75.8 kg)   BMI 21.44 kg/m   Constitutional:  Alert and oriented, No acute distress.  Elderly, frail.  Accompanied by daughters today.  Relating slowly with walker.  Hard of hearing. HEENT: [place] AT, moist mucus membranes.  Trachea midline, no masses. Cardiovascular: No clubbing, cyanosis, or edema. Respiratory: Normal respiratory effort, no increased work of breathing. GU: No CVA tenderness.  Neurologic: Grossly intact, no focal deficits, moving all 4  extremities. Psychiatric: Normal mood and affect.  Laboratory Data: Lab Results  Component Value Date   WBC 5.7 02/09/2017   HGB 11.8 (L) 02/09/2017   HCT 34.9 (L) 02/09/2017   MCV 91.0 02/09/2017   PLT 137 (L) 02/09/2017    Lab Results  Component Value Date   CREATININE 1.01 02/09/2017    Lab Results  Component Value Date   TESTOSTERONE <3 (L) 10/03/2016     Component     Latest Ref Rng & Units 10/03/2016 11/03/2016 12/01/2016 12/29/2016  Prostatic Specific Antigen     0.00 - 4.00 ng/mL 5.37 (H) 8.02 (H) 8.99 (H) 2.53   Component     Latest Ref Rng & Units 02/09/2017  Prostatic Specific Antigen     0.00 - 4.00 ng/mL 3.68    Urinalysis Lab Results  Component Value Date   SPECGRAV 1.010 02/10/2017   PHUR 6.0 02/10/2017   COLORU Yellow 02/10/2017   APPEARANCEUR Clear 02/10/2017   LEUKOCYTESUR Negative 02/10/2017   PROTEINUR Negative 02/10/2017   GLUCOSEU Negative 02/10/2017   KETONESU Negative 02/10/2017   RBCU Negative 02/10/2017   BILIRUBINUR Negative 02/10/2017   UUROB 0.2 02/10/2017   NITRITE Negative 02/10/2017    Lab Results  Component Value Date   LABMICR See below: 12/10/2016   WBCUA 0-5 12/10/2016   RBCUA 0-2 12/10/2016   LABEPIT None seen 12/10/2016   BACTERIA None seen 12/10/2016    Pertinent Imaging: Results for orders placed during the hospital encounter of 12/03/16  CT RENAL STONE STUDY   Narrative CLINICAL DATA:  Left kidney hydronephrosis.  EXAM: CT ABDOMEN AND PELVIS WITHOUT CONTRAST  TECHNIQUE: Multidetector CT imaging of the abdomen and pelvis was performed following the standard protocol without IV contrast.  COMPARISON:  10/06/2016  FINDINGS: Lower chest: No acute abnormality.  Hepatobiliary: No focal liver abnormality. The gallbladder appears within normal limits. No biliary dilatation.  Pancreas: Fatty replacement of the pancreas identified.  Spleen: Normal in size without focal abnormality.  Adrenals/Urinary Tract:  Normal appearance of the adrenal glands. No right kidney stone or hydronephrosis identified. Again noted are bilateral kidney lesions of varying density. These are incompletely characterized  without IV contrast. This includes an hyperdense lesion arising from the inferior pole of the right kidney measuring 1.2 cm and a hyperdense lesion arising from the inferior pole the left kidney measuring 1.6 cm.  There has been interval decompression of previously dilated left renal collecting system. Left-sided nephroureteral stent is identified. The proximal pigtail is within the proximal left ureter distal to the UPJ.  Previously described enhancing lesion at the left UVJ is difficult to characterize without IV contrast. On today's study this measures approximately 2.5 x 1.4 cm, image 75 of series 2. Previously 1.9 x 1.7 cm.  Stomach/Bowel: Stomach is normal. The small bowel loops have a normal course and caliber without evidence for a bowel obstruction. The appendix is visualized and appears normal. Unremarkable appearance of the colon. No pathologic dilatation of the large or small bowel loops.  Vascular/Lymphatic: Aortic atherosclerosis. Left-sided periaortic node measures 11 mm, image 39 of series 2. Previously 9 mm. Bilateral pelvic lymph node dissection. 9 mm left common iliac lymph node is stable, image 48 of series 2. Adjacent node measures 1 cm, image 51 of series 2. Previously this measured the same.  Reproductive: Previous prostatectomy.  Other: No free fluid or fluid collections within the abdomen or pelvis.  Musculoskeletal: Chronic compression fracture involve L3 is identified. There is degenerative disc disease noted at L4-5. No aggressive lytic or sclerotic bone lesions.  IMPRESSION: 1. Interval decompression of the left renal collecting system status post stent placement. Stent appears to of migrated distally. The pigtail is in the proximal left ureter below the  level of the UPJ. 2. Difficult to reassess enhancing lesion at the left UVJ due to lack of IV contrast material. This appears to be slightly increased in size from previous exam. 3. Increase in size of left periaortic lymph node. Stable appearance of borderline enlarged left common iliac nodes. 4. Bilateral kidney lesions of varying density are incompletely characterized without IV contrast. 5. Status post prostatectomy and bilateral pelvic lymph node dissection. 6.  Aortic Atherosclerosis (ICD10-I70.0).   Electronically Signed   By: Kerby Moors M.D.   On: 12/03/2016 09:25    CT scan reviewed again today.  Assessment & Plan:    1. Hydronephrosis, unspecified hydronephrosis type Secondary to extrinsic compression from pelvic mass presumably recurrent castrate resistant prostate cancer  We discussed ureteral stent exchange.  I like to take him to the operating room within the month to exchange his stent.  Depending on the degree of encrustation, we will spread out his stent exchanges to less frequently.    Additionally, if his PSA continues to decline and serial imaging reveals improving/resolution of his mass, we may consider stent removal at some point down the road.  We discussed the risks of stent exchange including risk of general anesthesia, infection, bleeding, stent discomfort amongst others.  All questions answered.  - Urinalysis, Complete - CULTURE, URINE COMPREHENSIVE  2. Prostate cancer [facility]) Managed by Dr. Rogue Bussing, currently on Lupron and Xtandi   Schedule cystoscopy, left ureteral stent exchange  Hollice Espy, MD  [place] [address], [place], [place] [address] [phone]

## 2017-02-10 NOTE — H&P (View-Only) (Signed)
02/10/2017 12:32 PM   Benjamin Villarreal [birth year] [id number]  Referring provider: Venia Carbon, MD [address] [place], [place] [address]  Chief Complaint  Patient presents with  . Hydronephrosis    discuss surgery    HPI: 84 year old male with a history of castrate resistant prostate cancer managed by Dr. Rogue Bussing found to have a 1.9 cm left UVJ mass.  He was taken to the operating room on 10/2016 for further evaluation at which time it was noted to be extrinsic to the bladder and ureter.  Ureteral stent was placed.  Follow-up CT scan on 11/2016 showed a decompressed left collecting system with a stent coiled in the proximal ureter.  He is now back on Lupron as well as Xtandi.  He has had PSA response to this medication.  He returns today to discuss management of his indwelling ureteral stent.  He denies any significant associated urinary symptoms of the stent.  No gross hematuria, urgency, frequency, or any other symptoms.  He does have occasional mild left flank pressure when he voids, otherwise no complaints.   PMH: Past Medical History:  Diagnosis Date  . Aortic atherosclerosis ([place])    Noted on CT  . Arthritis   . Colon polyps    adenomatous  . Dysrhythmia    pt unaware of this  . History of fracture of tibia   . History of tachycardia    patient unaware of this  . Kyphosis of cervicothoracic region    SEVERE  . Osteoporosis 01/2014   T -3.8 (01/2014)  . Other constipation    chronic  . Prostate cancer ([place])    surgery then lupron  . Unspecified venous (peripheral) insufficiency   . Vitamin B12 deficiency     Surgical History: Past Surgical History:  Procedure Laterality Date  . BASAL CELL CARCINOMA EXCISION  2004   Left side of face  . CATARACT EXTRACTION Bilateral 2000, 2003   both eyes  . COLONOSCOPY     h/o adenomatous polyps  . CYSTOSCOPY W/ RETROGRADES Bilateral 11/19/2016   Procedure: CYSTOSCOPY WITH RETROGRADE PYELOGRAM;  Surgeon:  Hollice Espy, MD;  Location: [facility];  Service: Urology;  Laterality: Bilateral;  General vs. Spinal  . CYSTOSCOPY WITH URETEROSCOPY AND STENT PLACEMENT Left 11/19/2016   Procedure: CYSTOSCOPY WITH URETEROSCOPY AND STENT PLACEMENT;  Surgeon: Hollice Espy, MD;  Location: [facility];  Service: Urology;  Laterality: Left;  . FRACTURE SURGERY    . JOINT REPLACEMENT  2005   Left knee  . JOINT REPLACEMENT  12/13   Right knee  . PROSTATE SURGERY  1996   cancer  . sigmoid resection    . TIBIA FRACTURE SURGERY  1999  . URETERAL BIOPSY Left 11/19/2016   Procedure: URETERAL BIOPSY;  Surgeon: Hollice Espy, MD;  Location: [facility];  Service: Urology;  Laterality: Left;  Marland Kitchen VOLVULUS REDUCTION  12/10    Home Medications:  Allergies as of 02/10/2017      Reactions   Other    RAW ONIONS- SINUS REACTION      Medication List        Accurate as of 02/10/17 11:59 PM. Always use your most recent med list.          aspirin 81 MG EC tablet Take 81 mg by mouth every other day.   Biotin 2.5 MG Caps Take 2.5 mg by mouth daily.   bismuth subsalicylate 650 PT/46FK suspension Commonly known as:  PEPTO BISMOL Take 10 mLs by mouth  4 (four) times daily -  before meals and at bedtime. Takes 2 1/2 times a day   Calcium-D 600-400 MG-UNIT Tabs Take 2 tablets by mouth daily.   cholecalciferol 1000 units tablet Commonly known as:  VITAMIN D Take 1,000 Units by mouth 2 (two) times daily.   clindamycin 150 MG capsule Commonly known as:  CLEOCIN Take 500 mg by mouth See admin instructions. Take 4 capsules prior to dental work   cyclobenzaprine 5 MG tablet Commonly known as:  FLEXERIL Take 5 mg by mouth at bedtime as needed for muscle spasms.   diphenhydrAMINE 25 mg capsule Commonly known as:  BENADRYL Take 25 mg by mouth every 4 (four) hours as needed for itching or allergies.   enzalutamide 40 MG capsule Commonly known as:  XTANDI Take 3 capsules (120 mg total) by mouth daily.   ferrous  sulfate 325 (65 FE) MG tablet Take 325 mg by mouth 2 (two) times daily with a meal.   ibuprofen 200 MG tablet Commonly known as:  ADVIL,MOTRIN Take 400 mg by mouth every 8 (eight) hours as needed for headache or mild pain.   latanoprost 0.005 % ophthalmic solution Commonly known as:  XALATAN Place 1 drop into both eyes at bedtime.   leuprolide 30 MG injection Commonly known as:  LUPRON Inject 30 mg into the muscle every 4 (four) months. Dose and interval not clear yet   loratadine 10 MG tablet Commonly known as:  CLARITIN TAKE 1 TABLET DAILY   nystatin powder Generic drug:  nystatin Apply topically 2 (two) times daily.   polyethylene glycol powder powder Commonly known as:  GLYCOLAX/MIRALAX FILL TO LINE (17 GRAMS ), MIX AND DRINK TWICE A DAY AND 8.5 GRAMS AT NOON   sennosides-docusate sodium 8.6-50 MG tablet Commonly known as:  SENOKOT-S Take 2 tablets by mouth every evening.   traMADol 50 MG tablet Commonly known as:  ULTRAM Take 1 tablet (50 mg total) by mouth 3 (three) times daily as needed.   triamcinolone cream 0.1 % Commonly known as:  KENALOG Apply 1 application topically every other day. Alternating with Ciclopirox   vitamin B-12 1000 MCG tablet Commonly known as:  CYANOCOBALAMIN Take 1,000 mcg by mouth daily.   vitamin B-12 1000 MCG tablet Commonly known as:  CYANOCOBALAMIN Take 1 tablet (1,000 mcg total) by mouth daily.   vitamin C 500 MG tablet Commonly known as:  ASCORBIC ACID Take 500 mg by mouth daily.   vitamin C 500 MG tablet Commonly known as:  ASCORBIC ACID Take 500 mg by mouth daily.   zoledronic acid 5 MG/100ML Soln injection Commonly known as:  RECLAST Inject 5 mg into the vein every 6 (six) months.       Allergies:  Allergies  Allergen Reactions  . Other     RAW ONIONS- SINUS REACTION    Family History: Family History  Problem Relation Age of Onset  . Diabetes Son   . Hypertension Paternal Grandfather   . Prostate cancer  Neg Hx   . Bladder Cancer Neg Hx   . Kidney cancer Neg Hx     Social History:  reports that  has never smoked. he has never used smokeless tobacco. He reports that he drinks alcohol. He reports that he does not use drugs.  ROS: UROLOGY Frequent Urination?: No Hard to postpone urination?: No Burning/pain with urination?: No Get up at night to urinate?: Yes Leakage of urine?: Yes Urine stream starts and stops?: No Trouble starting stream?: No  Do you have to strain to urinate?: No Blood in urine?: No Urinary tract infection?: No Sexually transmitted disease?: No Injury to kidneys or bladder?: No Painful intercourse?: No Weak stream?: No Erection problems?: No Penile pain?: No  Gastrointestinal Nausea?: No Vomiting?: No Indigestion/heartburn?: No Diarrhea?: No Constipation?: No  Constitutional Fever: No Night sweats?: No Weight loss?: No Fatigue?: Yes  Skin Skin rash/lesions?: No Itching?: No  Eyes Blurred vision?: No Double vision?: No  Ears/Nose/Throat Sore throat?: No Sinus problems?: No  Hematologic/Lymphatic Swollen glands?: No Easy bruising?: No  Cardiovascular Leg swelling?: No Chest pain?: No  Respiratory Cough?: No Shortness of breath?: No  Endocrine Excessive thirst?: No  Musculoskeletal Back pain?: No Joint pain?: No  Neurological Headaches?: No Dizziness?: No  Psychologic Depression?: No Anxiety?: No  Physical Exam: BP 139/64   Pulse 68   Ht 6\' 2"  (1.88 m)   Wt 167 lb (75.8 kg)   BMI 21.44 kg/m   Constitutional:  Alert and oriented, No acute distress.  Elderly, frail.  Accompanied by daughters today.  Relating slowly with walker.  Hard of hearing. HEENT: [place] AT, moist mucus membranes.  Trachea midline, no masses. Cardiovascular: No clubbing, cyanosis, or edema. Respiratory: Normal respiratory effort, no increased work of breathing. GU: No CVA tenderness.  Neurologic: Grossly intact, no focal deficits, moving all 4  extremities. Psychiatric: Normal mood and affect.  Laboratory Data: Lab Results  Component Value Date   WBC 5.7 02/09/2017   HGB 11.8 (L) 02/09/2017   HCT 34.9 (L) 02/09/2017   MCV 91.0 02/09/2017   PLT 137 (L) 02/09/2017    Lab Results  Component Value Date   CREATININE 1.01 02/09/2017    Lab Results  Component Value Date   TESTOSTERONE <3 (L) 10/03/2016     Component     Latest Ref Rng & Units 10/03/2016 11/03/2016 12/01/2016 12/29/2016  Prostatic Specific Antigen     0.00 - 4.00 ng/mL 5.37 (H) 8.02 (H) 8.99 (H) 2.53   Component     Latest Ref Rng & Units 02/09/2017  Prostatic Specific Antigen     0.00 - 4.00 ng/mL 3.68    Urinalysis Lab Results  Component Value Date   SPECGRAV 1.010 02/10/2017   PHUR 6.0 02/10/2017   COLORU Yellow 02/10/2017   APPEARANCEUR Clear 02/10/2017   LEUKOCYTESUR Negative 02/10/2017   PROTEINUR Negative 02/10/2017   GLUCOSEU Negative 02/10/2017   KETONESU Negative 02/10/2017   RBCU Negative 02/10/2017   BILIRUBINUR Negative 02/10/2017   UUROB 0.2 02/10/2017   NITRITE Negative 02/10/2017    Lab Results  Component Value Date   LABMICR See below: 12/10/2016   WBCUA 0-5 12/10/2016   RBCUA 0-2 12/10/2016   LABEPIT None seen 12/10/2016   BACTERIA None seen 12/10/2016    Pertinent Imaging: Results for orders placed during the hospital encounter of 12/03/16  CT RENAL STONE STUDY   Narrative CLINICAL DATA:  Left kidney hydronephrosis.  EXAM: CT ABDOMEN AND PELVIS WITHOUT CONTRAST  TECHNIQUE: Multidetector CT imaging of the abdomen and pelvis was performed following the standard protocol without IV contrast.  COMPARISON:  10/06/2016  FINDINGS: Lower chest: No acute abnormality.  Hepatobiliary: No focal liver abnormality. The gallbladder appears within normal limits. No biliary dilatation.  Pancreas: Fatty replacement of the pancreas identified.  Spleen: Normal in size without focal abnormality.  Adrenals/Urinary Tract:  Normal appearance of the adrenal glands. No right kidney stone or hydronephrosis identified. Again noted are bilateral kidney lesions of varying density. These are incompletely characterized  without IV contrast. This includes an hyperdense lesion arising from the inferior pole of the right kidney measuring 1.2 cm and a hyperdense lesion arising from the inferior pole the left kidney measuring 1.6 cm.  There has been interval decompression of previously dilated left renal collecting system. Left-sided nephroureteral stent is identified. The proximal pigtail is within the proximal left ureter distal to the UPJ.  Previously described enhancing lesion at the left UVJ is difficult to characterize without IV contrast. On today's study this measures approximately 2.5 x 1.4 cm, image 75 of series 2. Previously 1.9 x 1.7 cm.  Stomach/Bowel: Stomach is normal. The small bowel loops have a normal course and caliber without evidence for a bowel obstruction. The appendix is visualized and appears normal. Unremarkable appearance of the colon. No pathologic dilatation of the large or small bowel loops.  Vascular/Lymphatic: Aortic atherosclerosis. Left-sided periaortic node measures 11 mm, image 39 of series 2. Previously 9 mm. Bilateral pelvic lymph node dissection. 9 mm left common iliac lymph node is stable, image 48 of series 2. Adjacent node measures 1 cm, image 51 of series 2. Previously this measured the same.  Reproductive: Previous prostatectomy.  Other: No free fluid or fluid collections within the abdomen or pelvis.  Musculoskeletal: Chronic compression fracture involve L3 is identified. There is degenerative disc disease noted at L4-5. No aggressive lytic or sclerotic bone lesions.  IMPRESSION: 1. Interval decompression of the left renal collecting system status post stent placement. Stent appears to of migrated distally. The pigtail is in the proximal left ureter below the  level of the UPJ. 2. Difficult to reassess enhancing lesion at the left UVJ due to lack of IV contrast material. This appears to be slightly increased in size from previous exam. 3. Increase in size of left periaortic lymph node. Stable appearance of borderline enlarged left common iliac nodes. 4. Bilateral kidney lesions of varying density are incompletely characterized without IV contrast. 5. Status post prostatectomy and bilateral pelvic lymph node dissection. 6.  Aortic Atherosclerosis (ICD10-I70.0).   Electronically Signed   By: Kerby Moors M.D.   On: 12/03/2016 09:25    CT scan reviewed again today.  Assessment & Plan:    1. Hydronephrosis, unspecified hydronephrosis type Secondary to extrinsic compression from pelvic mass presumably recurrent castrate resistant prostate cancer  We discussed ureteral stent exchange.  I like to take him to the operating room within the month to exchange his stent.  Depending on the degree of encrustation, we will spread out his stent exchanges to less frequently.    Additionally, if his PSA continues to decline and serial imaging reveals improving/resolution of his mass, we may consider stent removal at some point down the road.  We discussed the risks of stent exchange including risk of general anesthesia, infection, bleeding, stent discomfort amongst others.  All questions answered.  - Urinalysis, Complete - CULTURE, URINE COMPREHENSIVE  2. Prostate cancer [facility]) Managed by Dr. Rogue Bussing, currently on Lupron and Xtandi   Schedule cystoscopy, left ureteral stent exchange  Hollice Espy, MD  [facility] [address] [place], [place] [address] [phone]

## 2017-02-11 ENCOUNTER — Telehealth: Payer: Self-pay | Admitting: *Deleted

## 2017-02-11 LAB — URINALYSIS, COMPLETE
Bilirubin, UA: NEGATIVE
Glucose, UA: NEGATIVE
KETONES UA: NEGATIVE
Leukocytes, UA: NEGATIVE
NITRITE UA: NEGATIVE
Protein, UA: NEGATIVE
RBC UA: NEGATIVE
Specific Gravity, UA: 1.01 (ref 1.005–1.030)
UUROB: 0.2 mg/dL (ref 0.2–1.0)
pH, UA: 6 (ref 5.0–7.5)

## 2017-02-11 NOTE — Telephone Encounter (Signed)
-----   Message from Cammie Sickle, MD sent at 02/10/2017 11:21 AM EST ----- Please inform patient-that his PSA is stable/improved from 3 months ago; however today-PSA is 3.68; which is slightly increased from a month ago.  No new recommendations continue Xtandi at this time.  Follow-up as planned

## 2017-02-11 NOTE — Telephone Encounter (Signed)
Pt aware of plan of care

## 2017-02-13 LAB — CULTURE, URINE COMPREHENSIVE

## 2017-02-20 ENCOUNTER — Encounter
Admission: RE | Admit: 2017-02-20 | Discharge: 2017-02-20 | Disposition: A | Discharge status: Home / Self Care | Payer: Medicare Other | Source: Ambulatory Visit | Attending: Urology | Admitting: Urology

## 2017-02-20 ENCOUNTER — Other Ambulatory Visit: Payer: Self-pay

## 2017-02-20 NOTE — Patient Instructions (Signed)
Your procedure is scheduled on: Wednesday 02/25/17 Report to [facility]. 2ND FLOOR MEDICAL MALL ENTRANCE. To find out your arrival time please call [phone] between 1PM - 3PM on Monday 02/23/17.  Remember: Instructions that are not followed completely may result in serious medical risk, up to and including death, or upon the discretion of your surgeon and anesthesiologist your surgery may need to be rescheduled.    __X__ 1. Do not eat anything after midnight the night before your    procedure.  No gum chewing or hard candies.  You may drink clear   liquids up to 2 hours before you are scheduled to arrive at the   hospital for your procedure. Do not drink clear liquids within 2   hours of scheduled arrival to the hospital as this may lead to your   procedure being delayed or rescheduled.       Clear liquids include:   Water or Apple juice without pulp   Clear carbohydrate beverage such as Clearfast or Gatorade   Black coffee or Clear Tea (no milk, no creamer, do not add anything   to the coffee or tea)    Diabetics should only drink water   __X__ 2. No Alcohol for 24 hours before or after surgery.   ____ 3. Bring all medications with you on the day of surgery if instructed.    __X__ 4. Notify your doctor if there is any change in your medical condition     (cold, fever, infections).             __X___5. No smoking within 24 hours of your surgery.     Do not wear jewelry, make-up, hairpins, clips or nail polish.  Do not wear lotions, powders, or perfumes.   Do not shave 48 hours prior to surgery. Men may shave face and neck.  Do not bring valuables to the hospital.    [facility] is not responsible for any belongings or valuables.               Contacts, dentures or bridgework may not be worn into surgery.  Leave your suitcase in the car. After surgery it may be brought to your room.  For patients admitted to the hospital, discharge time is determined by your                 treatment team.   Patients discharged the day of surgery will not be allowed to drive home.   Please read over the following fact sheets that you were given:   MRSA Information   __X__ Take these medicines the morning of surgery with A SIP OF WATER:    1.   2.   3.   4.  5.  6.  ____ Fleet Enema (as directed)   ____ Use CHG Soap/SAGE wipes as directed  ____ Use inhalers on the day of surgery  ____ Stop metformin 2 days prior to surgery    ____ Take 1/2 of usual insulin dose the night before surgery and none on the morning of surgery.   ____ Stop Coumadin/Plavix/aspirin on   __X__ Stop Anti-inflammatories such as Advil, Aleve, Ibuprofen, Motrin, Naproxen, Naprosyn, Goodies,powder, or aspirin products.  OK to take Tylenol.   __X__ Stop supplements, Vitamin E, Fish Oil until after surgery.    ____ Bring C-Pap to the hospital.

## 2017-02-23 ENCOUNTER — Telehealth: Payer: Self-pay | Admitting: Pharmacist

## 2017-02-23 NOTE — Telephone Encounter (Signed)
Oral Chemotherapy Pharmacist Encounter  Follow-Up Form  Called patient today to follow up regarding patient's oral chemotherapy medication: Xtandi (enzalutamide)   Original Start date of oral chemotherapy: 11/2016  Pt reports 0 tablets/doses of Xtandi missed in the last month. Reviewed plan for missed doses.   Pt reports the following side effects: occasionally fatigued  New medications?: None reported  Other Issues: N/A  Patient knows to call the office with questions or concerns. Oral [facility]cology Clinic will continue to follow.  Darl Pikes, PharmD, BCPS Hematology/Oncology Clinical Pharmacist [facility] [phone]  02/23/2017 2:50 PM

## 2017-02-24 MED ORDER — CEFAZOLIN SODIUM-DEXTROSE 1-4 GM/50ML-% IV SOLN
1.0000 g | INTRAVENOUS | Status: AC
  Administered 2017-02-25: 1 g via INTRAVENOUS

## 2017-02-25 ENCOUNTER — Encounter: Payer: Self-pay | Admitting: *Deleted

## 2017-02-25 ENCOUNTER — Ambulatory Visit
Admission: RE | Admit: 2017-02-25 | Discharge: 2017-02-25 | Disposition: A | Discharge status: Home / Self Care | Payer: Medicare Other | Source: Ambulatory Visit | Attending: Urology | Admitting: Urology

## 2017-02-25 ENCOUNTER — Ambulatory Visit: Payer: Medicare Other | Admitting: Anesthesiology

## 2017-02-25 ENCOUNTER — Other Ambulatory Visit: Payer: Self-pay

## 2017-02-25 ENCOUNTER — Encounter
Admission: RE | Disposition: A | Discharge status: Home / Self Care | Payer: Self-pay | Source: Ambulatory Visit | Attending: Urology

## 2017-02-25 DIAGNOSIS — Z86010 Personal history of colonic polyps: Secondary | ICD-10-CM | POA: Diagnosis not present

## 2017-02-25 DIAGNOSIS — C61 Malignant neoplasm of prostate: Secondary | ICD-10-CM

## 2017-02-25 DIAGNOSIS — N289 Disorder of kidney and ureter, unspecified: Secondary | ICD-10-CM | POA: Insufficient documentation

## 2017-02-25 DIAGNOSIS — G709 Myoneural disorder, unspecified: Secondary | ICD-10-CM | POA: Insufficient documentation

## 2017-02-25 DIAGNOSIS — Z91018 Allergy to other foods: Secondary | ICD-10-CM | POA: Insufficient documentation

## 2017-02-25 DIAGNOSIS — N1330 Unspecified hydronephrosis: Secondary | ICD-10-CM

## 2017-02-25 DIAGNOSIS — Z7982 Long term (current) use of aspirin: Secondary | ICD-10-CM | POA: Diagnosis not present

## 2017-02-25 DIAGNOSIS — Z9079 Acquired absence of other genital organ(s): Secondary | ICD-10-CM | POA: Diagnosis not present

## 2017-02-25 DIAGNOSIS — Z85828 Personal history of other malignant neoplasm of skin: Secondary | ICD-10-CM | POA: Diagnosis not present

## 2017-02-25 DIAGNOSIS — D649 Anemia, unspecified: Secondary | ICD-10-CM | POA: Diagnosis not present

## 2017-02-25 DIAGNOSIS — Z833 Family history of diabetes mellitus: Secondary | ICD-10-CM | POA: Insufficient documentation

## 2017-02-25 DIAGNOSIS — M1990 Unspecified osteoarthritis, unspecified site: Secondary | ICD-10-CM | POA: Diagnosis not present

## 2017-02-25 DIAGNOSIS — Z79899 Other long term (current) drug therapy: Secondary | ICD-10-CM | POA: Insufficient documentation

## 2017-02-25 DIAGNOSIS — Z8249 Family history of ischemic heart disease and other diseases of the circulatory system: Secondary | ICD-10-CM | POA: Insufficient documentation

## 2017-02-25 DIAGNOSIS — D500 Iron deficiency anemia secondary to blood loss (chronic): Secondary | ICD-10-CM | POA: Diagnosis not present

## 2017-02-25 DIAGNOSIS — I739 Peripheral vascular disease, unspecified: Secondary | ICD-10-CM | POA: Diagnosis not present

## 2017-02-25 HISTORY — PX: CYSTOSCOPY W/ URETERAL STENT PLACEMENT: SHX1429

## 2017-02-25 LAB — GLUCOSE, CAPILLARY
GLUCOSE-CAPILLARY: 98 mg/dL (ref 65–99)
Glucose-Capillary: 99 mg/dL (ref 65–99)

## 2017-02-25 SURGERY — CYSTOSCOPY, FLEXIBLE, WITH STENT REPLACEMENT
Anesthesia: General | Laterality: Left

## 2017-02-25 MED ORDER — LIDOCAINE HCL (PF) 2 % IJ SOLN
INTRAMUSCULAR | Status: AC
  Filled 2017-02-25: qty 10

## 2017-02-25 MED ORDER — CEFAZOLIN SODIUM-DEXTROSE 1-4 GM/50ML-% IV SOLN
INTRAVENOUS | Status: AC
  Filled 2017-02-25: qty 50

## 2017-02-25 MED ORDER — ONDANSETRON HCL 4 MG/2ML IJ SOLN
INTRAMUSCULAR | Status: DC | PRN
  Administered 2017-02-25: 4 mg via INTRAVENOUS

## 2017-02-25 MED ORDER — EPHEDRINE SULFATE 50 MG/ML IJ SOLN
INTRAMUSCULAR | Status: AC
  Filled 2017-02-25: qty 1

## 2017-02-25 MED ORDER — FENTANYL CITRATE (PF) 100 MCG/2ML IJ SOLN: 25.0000 ug | INTRAMUSCULAR | Status: DC | PRN

## 2017-02-25 MED ORDER — SODIUM CHLORIDE 0.9 % IR SOLN
Status: DC | PRN
  Administered 2017-02-25: 300 mL via INTRAVESICAL

## 2017-02-25 MED ORDER — FENTANYL CITRATE (PF) 100 MCG/2ML IJ SOLN
INTRAMUSCULAR | Status: DC | PRN
  Administered 2017-02-25: 50 ug via INTRAVENOUS

## 2017-02-25 MED ORDER — FENTANYL CITRATE (PF) 100 MCG/2ML IJ SOLN
INTRAMUSCULAR | Status: AC
  Filled 2017-02-25: qty 2

## 2017-02-25 MED ORDER — EPHEDRINE SULFATE 50 MG/ML IJ SOLN
INTRAMUSCULAR | Status: DC | PRN
  Administered 2017-02-25: 10 mg via INTRAVENOUS

## 2017-02-25 MED ORDER — SODIUM CHLORIDE 0.9 % IV SOLN
INTRAVENOUS | Status: DC
  Administered 2017-02-25 (×2): via INTRAVENOUS

## 2017-02-25 MED ORDER — DEXAMETHASONE SODIUM PHOSPHATE 10 MG/ML IJ SOLN
INTRAMUSCULAR | Status: AC
  Filled 2017-02-25: qty 1

## 2017-02-25 MED ORDER — GLYCOPYRROLATE 0.2 MG/ML IJ SOLN
INTRAMUSCULAR | Status: DC | PRN
  Administered 2017-02-25: 0.2 mg via INTRAVENOUS

## 2017-02-25 MED ORDER — LIDOCAINE HCL (CARDIAC) 20 MG/ML IV SOLN
INTRAVENOUS | Status: DC | PRN
  Administered 2017-02-25: 100 mg via INTRAVENOUS

## 2017-02-25 MED ORDER — GLYCOPYRROLATE 0.2 MG/ML IJ SOLN
INTRAMUSCULAR | Status: AC
  Filled 2017-02-25: qty 1

## 2017-02-25 MED ORDER — ONDANSETRON HCL 4 MG/2ML IJ SOLN: 4.0000 mg | Freq: Once | INTRAMUSCULAR | Status: DC | PRN

## 2017-02-25 MED ORDER — ONDANSETRON HCL 4 MG/2ML IJ SOLN
INTRAMUSCULAR | Status: AC
  Filled 2017-02-25: qty 2

## 2017-02-25 MED ORDER — DEXAMETHASONE SODIUM PHOSPHATE 10 MG/ML IJ SOLN
INTRAMUSCULAR | Status: DC | PRN
  Administered 2017-02-25: 10 mg via INTRAVENOUS

## 2017-02-25 MED ORDER — PROPOFOL 10 MG/ML IV BOLUS
INTRAVENOUS | Status: DC | PRN
  Administered 2017-02-25: 100 mg via INTRAVENOUS

## 2017-02-25 SURGICAL SUPPLY — 21 items
BAG DRAIN CYSTO-URO LG1000N (MISCELLANEOUS) ×2 IMPLANT
BRUSH SCRUB EZ  4% CHG (MISCELLANEOUS)
BRUSH SCRUB EZ 4% CHG (MISCELLANEOUS) IMPLANT
CATH URETL 5X70 OPEN END (CATHETERS) ×2 IMPLANT
CONRAY 43 FOR UROLOGY 50M (MISCELLANEOUS) ×2 IMPLANT
GLOVE BIO SURGEON STRL SZ 6.5 (GLOVE) ×2 IMPLANT
GOWN STRL REUS W/ TWL LRG LVL3 (GOWN DISPOSABLE) ×2 IMPLANT
GOWN STRL REUS W/TWL LRG LVL3 (GOWN DISPOSABLE) ×2
KIT RM TURNOVER CYSTO AR (KITS) ×2 IMPLANT
PACK CYSTO AR (MISCELLANEOUS) ×2 IMPLANT
SENSORWIRE 0.038 NOT ANGLED (WIRE) ×2
SET CYSTO W/LG BORE CLAMP LF (SET/KITS/TRAYS/PACK) ×2 IMPLANT
SOL .9 NS 3000ML IRR  AL (IV SOLUTION) ×1
SOL .9 NS 3000ML IRR UROMATIC (IV SOLUTION) ×1 IMPLANT
STENT URET 6FRX24 CONTOUR (STENTS) IMPLANT
STENT URET 6FRX26 CONTOUR (STENTS) IMPLANT
STENT URO INLAY 6FRX26CM (STENTS) ×2 IMPLANT
SURGILUBE 2OZ TUBE FLIPTOP (MISCELLANEOUS) ×2 IMPLANT
SYRINGE IRR TOOMEY STRL 70CC (SYRINGE) ×2 IMPLANT
WATER STERILE IRR 1000ML POUR (IV SOLUTION) ×2 IMPLANT
WIRE SENSOR 0.038 NOT ANGLED (WIRE) ×1 IMPLANT

## 2017-02-25 NOTE — Anesthesia Preprocedure Evaluation (Addendum)
Anesthesia Evaluation  Patient identified by MRN, date of birth, ID band Patient awake    Reviewed: Allergy & Precautions, NPO status , Patient's Chart, lab work & pertinent test results, reviewed documented beta blocker date and time   Airway Mallampati: II  TM Distance: >3 FB     Dental  (+) Chipped   Pulmonary           Cardiovascular + Peripheral Vascular Disease  + dysrhythmias      Neuro/Psych  Neuromuscular disease    GI/Hepatic   Endo/Other    Renal/GU Renal disease     Musculoskeletal  (+) Arthritis ,   Abdominal   Peds  Hematology  (+) anemia ,   Anesthesia Other Findings Denies stroke or TIA.  Reproductive/Obstetrics                            Anesthesia Physical Anesthesia Plan  ASA: III  Anesthesia Plan: General   Post-op Pain Management:    Induction: Intravenous  PONV Risk Score and Plan:   Airway Management Planned: LMA  Additional Equipment:   Intra-op Plan:   Post-operative Plan:   Informed Consent: I have reviewed the patients History and Physical, chart, labs and discussed the procedure including the risks, benefits and alternatives for the proposed anesthesia with the patient or authorized representative who has indicated his/her understanding and acceptance.     Plan Discussed with: CRNA  Anesthesia Plan Comments:         Anesthesia Quick Evaluation

## 2017-02-25 NOTE — Op Note (Signed)
Date of procedure: 02/25/17  Preoperative diagnosis:  1. Left hydronephrosis 2. Prostate cancer  Postoperative diagnosis:  1. Same as above  Procedure: 1. Cystoscopy 2. Left retrograde pyelogram 3. Left ureteral stent exchange  Surgeon: Hollice Espy, MD  Anesthesia: General  Complications: None  Intraoperative findings: Previous ureteral stent located with the proximal coil in the mid ureter, mild amount of mid ureteral dilation.  No stent encrustation appreciated.  Stent replaced in appropriate position.  No obvious bladder mass or extrinsic compression appreciated.  EBL: Minimal  Specimens: None  Drains: 6 x 26 French double-J ureteral stent on left, Bard Optima  Indication: Benjamin Villarreal is a 84 y.o. patient with castrate resistant prostate cancer with recurrence of local pelvic disease with extrinsic compression on the left UVJ resulting in left hydronephrosis managed with an indwelling ureteral stent first placed on 10/2016.  After reviewing the management options for treatment, he elected to proceed with the above surgical procedure(s). We have discussed the potential benefits and risks of the procedure, side effects of the proposed treatment, the likelihood of the patient achieving the goals of the procedure, and any potential problems that might occur during the procedure or recuperation. Informed consent has been obtained.  Description of procedure:  The patient was taken to the operating room and general anesthesia was induced.  The patient was placed in the dorsal lithotomy position, prepped and draped in the usual sterile fashion, and preoperative antibiotics were administered. A preoperative time-out was performed.   A 21 French scope was advanced per urethra into the bladder.  Of note, the prostate was surgically absent.  The distal coil of the left ureteral stent was seen emanating from the UO and a fairly longer portion of the stent was seen within the bladder than  anticipated.  On scout imaging, the proximal coil of the stent was seen at the level of the mid ureter.  An open-ended ureteral catheter was placed alongside the stent retrograde pyelogram was performed.  This demonstrated nonspecific dilation of the mid ureter without proximal hydroureteronephrosis.  A wire was then placed up to level the renal pelvis alongside of the ureteral stent without difficulty.  Stent graspers was then used to remove the stent leaving the wire in place.  The wire was then backloaded over rigid cystoscope.  A 6 x 26 French double-J ureteral stent was advanced all the way up to level the renal pelvis.  The wire is partially drawn until full coils noted within the renal pelvis.  The wire was then fully withdrawn a full coils noted within the bladder.  The bladder was then drained.  Patient was then clean and dry, repositioned in supine position, reversed from anesthesia, taken the PACU in stable condition.  Plan: Patient will follow up in 6 months to discuss his next ureteral stent exchange.  He has had some response in his PSA but has not had any interval serial imaging.  He will be seeing his oncologist again next month.  Advised his family to contact our office if he does have any CT scans to assess the status of his pelvic mass and to evaluate consideration of stent removal if he has had an excellent response to [place].  All questions answered.  Hollice Espy, M.D.

## 2017-02-25 NOTE — Anesthesia Postprocedure Evaluation (Signed)
Anesthesia Post Note  Patient: Schyler Counsell  Procedure(s) Performed: CYSTOSCOPY WITH STENT REPLACEMENT (Left )  Patient location during evaluation: PACU Anesthesia Type: General Level of consciousness: awake and alert Pain management: pain level controlled Vital Signs Assessment: post-procedure vital signs reviewed and stable Respiratory status: spontaneous breathing, nonlabored ventilation, respiratory function stable and patient connected to nasal cannula oxygen Cardiovascular status: blood pressure returned to baseline and stable Postop Assessment: no apparent nausea or vomiting Anesthetic complications: no     Last Vitals:  Vitals:   02/25/17 1350 02/25/17 1355  BP: (!) 141/71   Pulse: 63 62  Resp: 16 17  Temp:  36.6 C  SpO2: 95% 95%    Last Pain:  Vitals:   02/25/17 1124  TempSrc: Oral  PainSc: 0-No pain                 Rahn Lacuesta S

## 2017-02-25 NOTE — Discharge Instructions (Signed)
° ° ° ° ° ° ° °  AMBULATORY SURGERY  DISCHARGE INSTRUCTIONS   1) The drugs that you were given will stay in your system until tomorrow so for the next 24 hours you should not:  A) Drive an automobile B) Make any legal decisions C) Drink any alcoholic beverage   2) You may resume regular meals tomorrow.  Today it is better to start with liquids and gradually work up to solid foods.  You may eat anything you prefer, but it is better to start with liquids, then soup and crackers, and gradually work up to solid foods.   3) Please notify your doctor immediately if you have any unusual bleeding, trouble breathing, redness and pain at the surgery site, drainage, fever, or pain not relieved by medication.    4) Additional Instructions:        Please contact your physician with any problems or Same Day Surgery at [phone], Monday through Friday 6 am to 4 pm, or [place] at [facility] number at [phone].You have a ureteral stent in place.  This is a tube that extends from your kidney to your bladder.  This may cause urinary bleeding, burning with urination, and urinary frequency.  Please call our office or present to the ED if you develop fevers >101 or pain which is not able to be controlled with oral pain medications.  You may be given either Flomax and/ or ditropan to help with bladder spasms and stent pain in addition to pain medications.    It is imperative that you follow-up in 6 months to discuss stent exchange.  If the stent remains in place without exchange in a timely manner, this could result in complications including kidney damage.  [facility] [address] [place], [place] [address] [phone]

## 2017-02-25 NOTE — Anesthesia Post-op Follow-up Note (Signed)
Anesthesia QCDR form completed.        

## 2017-02-25 NOTE — Interval H&P Note (Signed)
History and Physical Interval Note:  02/25/2017 12:36 PM  Benjamin Villarreal  has presented today for surgery, with the diagnosis of prostate cancer, left hydronephrosis  The various methods of treatment have been discussed with the patient and family. After consideration of risks, benefits and other options for treatment, the patient has consented to  Procedure(s): CYSTOSCOPY WITH STENT REPLACEMENT (Left) as a surgical intervention .  The patient's history has been reviewed, patient examined, no change in status, stable for surgery.  I have reviewed the patient's chart and labs.  Questions were answered to the patient's satisfaction.    RRR CTAB   Hollice Espy

## 2017-02-25 NOTE — Anesthesia Procedure Notes (Signed)
Procedure Name: LMA Insertion Date/Time: 02/25/2017 12:52 PM Performed by: Silvana Newness, CRNA Pre-anesthesia Checklist: Patient identified, Emergency Drugs available, Suction available, Patient being monitored and Timeout performed Patient Re-evaluated:Patient Re-evaluated prior to induction Oxygen Delivery Method: Circle system utilized Preoxygenation: Pre-oxygenation with 100% oxygen Induction Type: IV induction Ventilation: Mask ventilation without difficulty LMA: LMA inserted LMA Size: 4.0 Number of attempts: 1 Placement Confirmation: positive ETCO2 and breath sounds checked- equal and bilateral Tube secured with: Tape Dental Injury: Teeth and Oropharynx as per pre-operative assessment

## 2017-02-25 NOTE — Transfer of Care (Signed)
Immediate Anesthesia Transfer of Care Note  Patient: Benjamin Villarreal  Procedure(s) Performed: CYSTOSCOPY WITH STENT REPLACEMENT (Left )  Patient Location: PACU  Anesthesia Type:General  Level of Consciousness: drowsy and patient cooperative  Airway & Oxygen Therapy: Patient Spontanous Breathing and Patient connected to face mask oxygen  Post-op Assessment: Report given to RN and Post -op Vital signs reviewed and stable  Post vital signs: Reviewed and stable  Last Vitals:  Vitals:   02/25/17 1124 02/25/17 1318  BP: 129/66 125/60  Pulse: 79 71  Resp: 18 14  Temp: 36.7 C   SpO2: 99% 99%    Last Pain:  Vitals:   02/25/17 1124  TempSrc: Oral  PainSc: 0-No pain         Complications: No apparent anesthesia complications

## 2017-03-02 ENCOUNTER — Ambulatory Visit (INDEPENDENT_AMBULATORY_CARE_PROVIDER_SITE_OTHER): Payer: Medicare Other | Admitting: Podiatry

## 2017-03-02 ENCOUNTER — Encounter: Payer: Self-pay | Admitting: Podiatry

## 2017-03-02 ENCOUNTER — Ambulatory Visit (INDEPENDENT_AMBULATORY_CARE_PROVIDER_SITE_OTHER): Payer: Medicare Other

## 2017-03-02 DIAGNOSIS — S92912D Unspecified fracture of left toe(s), subsequent encounter for fracture with routine healing: Secondary | ICD-10-CM

## 2017-03-02 NOTE — Progress Notes (Signed)
This patient presents to the office for continued evaluation of a diagnosis closed fracture of his fourth toe and probable fracture third metatarsal head.  Patient presents the office today stating he is not having any pain or discomfort.  He is ambulating in a surgical shoe.  He still has significant swelling.  He presents the office today for continued evaluation and treatment of his left foot.  General Appearance  Alert, conversant and in no acute stress.  Vascular  Dorsalis pedis and posterior pulses are palpable  bilaterally.  Capillary return is within normal limits  bilaterally. Temperature is within normal limits  Bilaterally.  Neurologic  Senn-Weinstein monofilament wire test within normal limits  bilaterally. Muscle power within normal limits bilaterally.  Nails Thick disfigured discolored nails with subungual debris bilaterally from hallux to fifth toes bilaterally. No evidence of bacterial infection or drainage bilaterally.  Orthopedic  No limitations of motion of motion feet bilaterally. Examination of the left foot reveals no palpable pain noted at the fracture sites.  There is no evidence of any increased temperature noted in the left forefoot.  There is still dramatic swelling noted on the left foot.  Skin  normotropic skin with no porokeratosis noted bilaterally.  No signs of infections or ulcers noted.    Closed fracture left foot.    ROV  X-rays taken still reveal a fracture of the fourth toe of the left foot and possible/probable fracture third metatarsal head left foot.  There is still dramatic swelling noted in the left foot which has not resolved.  He has been told to wear the compression sock, but he presents the office today with no sock.  I am concerned about the significant swelling and I rechecked the x-ray to reveal no evidence of any fracture at the base of the metatarsals of the left foot.  Told this patient. He needs to use compression to reduce the swelling and to  elevate his foot, both sleeping and in his recliner.  Clinically he is doing well except for the swelling that is noted.RTC for fracture healing prn.  Told to return for preventative foot care services in 2 months.   Gardiner Barefoot DPM

## 2017-03-06 MED FILL — XTANDI 40 MG CAPSULE: 40 | 30 days supply | Qty: 90 | Fill #3

## 2017-03-09 ENCOUNTER — Ambulatory Visit: Payer: Medicare Other | Admitting: Podiatry

## 2017-03-23 ENCOUNTER — Encounter: Payer: Self-pay | Admitting: Podiatry

## 2017-03-23 ENCOUNTER — Ambulatory Visit (INDEPENDENT_AMBULATORY_CARE_PROVIDER_SITE_OTHER): Payer: Medicare Other

## 2017-03-23 ENCOUNTER — Ambulatory Visit (INDEPENDENT_AMBULATORY_CARE_PROVIDER_SITE_OTHER): Payer: Medicare Other | Admitting: Podiatry

## 2017-03-23 DIAGNOSIS — S92912D Unspecified fracture of left toe(s), subsequent encounter for fracture with routine healing: Secondary | ICD-10-CM | POA: Diagnosis not present

## 2017-03-23 DIAGNOSIS — I890 Lymphedema, not elsewhere classified: Secondary | ICD-10-CM

## 2017-03-23 NOTE — Progress Notes (Signed)
This patient presents to the office for continued evaluation of a diagnosis closed fracture of his fourth toe and probable fracture third metatarsal head.  Patient presents the office today stating he is not having any pain or discomfort.  He is ambulating in a surgical shoe.  He still has significant swelling.  He presents the office today for continued evaluation and treatment of his left foot.  General Appearance  Alert, conversant and in no acute stress.  Vascular  Dorsalis pedis and posterior pulses are palpable  bilaterally.  Capillary return is within normal limits  bilaterally. Temperature is within normal limits  Bilaterally.  Neurologic  Senn-Weinstein monofilament wire test within normal limits  bilaterally. Muscle power within normal limits bilaterally.  Nails Thick disfigured discolored nails with subungual debris bilaterally from hallux to fifth toes bilaterally. No evidence of bacterial infection or drainage bilaterally.  Orthopedic  No limitations of motion of motion feet bilaterally. Examination of the left foot reveals no palpable pain noted at the fracture sites.  There is no evidence of any increased temperature noted in the left forefoot.  There is still dramatic swelling noted on the left foot.  Skin  normotropic skin with no porokeratosis noted bilaterally.  No signs of infections or ulcers noted.    Closed fracture left foot.    ROV  X-rays taken still reveal a fracture of the fourth toe of the left foot and possible/probable fracture third metatarsal head left foot.  There is still dramatic swelling noted in the left foot which has not resolved.  He has been wearing his compression sock.  The swelling is still prominent and is even getting harder foot. There is still pitting edema noted on the dorsum of the metatarsals, left foot.  Patient is concerned about the swelling.  After review the x-rays we discussed his condition and we chose to apply an [employer]  to be  worn  f in  an effort to reduce his swelling.  RTC 1 week for unna boot removal.   Gardiner Barefoot DPM

## 2017-03-30 ENCOUNTER — Encounter: Payer: Self-pay | Admitting: Podiatry

## 2017-03-30 ENCOUNTER — Other Ambulatory Visit: Payer: Medicare Other

## 2017-03-30 ENCOUNTER — Ambulatory Visit: Payer: Medicare Other

## 2017-03-30 ENCOUNTER — Ambulatory Visit: Payer: Medicare Other | Admitting: Internal Medicine

## 2017-03-30 ENCOUNTER — Ambulatory Visit (INDEPENDENT_AMBULATORY_CARE_PROVIDER_SITE_OTHER): Payer: Medicare Other | Admitting: Podiatry

## 2017-03-30 DIAGNOSIS — S92912D Unspecified fracture of left toe(s), subsequent encounter for fracture with routine healing: Secondary | ICD-10-CM

## 2017-03-30 DIAGNOSIS — I890 Lymphedema, not elsewhere classified: Secondary | ICD-10-CM

## 2017-03-30 NOTE — Progress Notes (Signed)
This patient presents the office for continued evaluation and treatment of a toe fracture, left foot with significant swelling on the top of his left foot.  He presents the office today wearing an Haematologist which has now been removed.  He is ambulating with his surgical shoe.  He presents the office today for continued evaluation and treatment of this left foot.   General Appearance  Alert, conversant and in no acute stress.  Vascular  Dorsalis pedis and posterior pulses are palpable  bilaterally.  Capillary return is within normal limits  bilaterally. Temperature is within normal limits  Bilaterally.  Neurologic  Senn-Weinstein monofilament wire test diminished  bilaterally. Muscle power within normal limits bilaterally.  Nails Thick disfigured discolored nails with subungual debris bilaterally from hallux to fifth toes bilaterally. No evidence of bacterial infection or drainage bilaterally.  Orthopedic  No limitations of motion of motion feet bilaterally.  No crepitus or effusions noted.  No bony pathology or digital deformities noted.. No evidence of any palpable pain noted at the [facility] the head of the metatarsals or the toes both feet  Skin  normotropic skin with no porokeratosis noted bilaterally.  No signs of infections or ulcers noted.  There does appear to be increased redness and swelling noted in his left lower leg above the level of the Unna boot that he was wearing.  Swelling persists over the metatarsals, left foot and above the [employer] joint, left foot.  All digits of the left foot are swollen.   Closed fracture  Lymphedema  ROV  Removal of the Unna boot was performed.  There is has been an improvement in the amount of swelling but swelling still persists.  This patient again has no pain or discomfort in his left foot and we are most concerned about the persistent swelling.  In reviewing this patient was noted to have increased swelling and redness had a brief preventative  foot care services visit.  X-rays taken do reveal pathology to the fourth toe of the left foot with possible third metatarsal head fracture and no evidence of any Lisfranc joint breakdown. Due to the fact that the swelling persists. I discussed this with the patient and his daughter and we decided to order an MRI to see if there is any orthopedic problem related to his left foot.  There is a diagnosis of a chronic venous insufficiency which may explain his swelling.  I told this patient that he should obtain an  MRI to rule out any orthopedic pathology.  If there is no bony pathology on the MRI, we will then need to consider a vascular referral.  RTC after MRI results are received. Patient to be called when the MRI is scheduled.     Gardiner Barefoot DPM

## 2017-04-01 ENCOUNTER — Inpatient Hospital Stay (HOSPITAL_BASED_OUTPATIENT_CLINIC_OR_DEPARTMENT_OTHER): Payer: Medicare Other | Admitting: Internal Medicine

## 2017-04-01 ENCOUNTER — Other Ambulatory Visit: Payer: Self-pay

## 2017-04-01 ENCOUNTER — Inpatient Hospital Stay: Payer: Medicare Other | Attending: Internal Medicine

## 2017-04-01 ENCOUNTER — Inpatient Hospital Stay: Payer: Medicare Other

## 2017-04-01 VITALS — BP 116/64 | HR 71 | Temp 97.9°F | Resp 20

## 2017-04-01 DIAGNOSIS — M129 Arthropathy, unspecified: Secondary | ICD-10-CM | POA: Diagnosis not present

## 2017-04-01 DIAGNOSIS — R1909 Other intra-abdominal and pelvic swelling, mass and lump: Secondary | ICD-10-CM

## 2017-04-01 DIAGNOSIS — R110 Nausea: Secondary | ICD-10-CM | POA: Diagnosis not present

## 2017-04-01 DIAGNOSIS — Z192 Hormone resistant malignancy status: Secondary | ICD-10-CM | POA: Diagnosis not present

## 2017-04-01 DIAGNOSIS — C778 Secondary and unspecified malignant neoplasm of lymph nodes of multiple regions: Secondary | ICD-10-CM | POA: Insufficient documentation

## 2017-04-01 DIAGNOSIS — C61 Malignant neoplasm of prostate: Secondary | ICD-10-CM | POA: Diagnosis not present

## 2017-04-01 DIAGNOSIS — E538 Deficiency of other specified B group vitamins: Secondary | ICD-10-CM | POA: Insufficient documentation

## 2017-04-01 DIAGNOSIS — Z86010 Personal history of colonic polyps: Secondary | ICD-10-CM | POA: Diagnosis not present

## 2017-04-01 DIAGNOSIS — M40203 Unspecified kyphosis, cervicothoracic region: Secondary | ICD-10-CM | POA: Insufficient documentation

## 2017-04-01 DIAGNOSIS — I739 Peripheral vascular disease, unspecified: Secondary | ICD-10-CM

## 2017-04-01 DIAGNOSIS — K5909 Other constipation: Secondary | ICD-10-CM | POA: Insufficient documentation

## 2017-04-01 DIAGNOSIS — D509 Iron deficiency anemia, unspecified: Secondary | ICD-10-CM

## 2017-04-01 DIAGNOSIS — Z85828 Personal history of other malignant neoplasm of skin: Secondary | ICD-10-CM | POA: Insufficient documentation

## 2017-04-01 DIAGNOSIS — K5900 Constipation, unspecified: Secondary | ICD-10-CM

## 2017-04-01 DIAGNOSIS — Z79899 Other long term (current) drug therapy: Secondary | ICD-10-CM

## 2017-04-01 DIAGNOSIS — R21 Rash and other nonspecific skin eruption: Secondary | ICD-10-CM | POA: Diagnosis not present

## 2017-04-01 DIAGNOSIS — Z79818 Long term (current) use of other agents affecting estrogen receptors and estrogen levels: Secondary | ICD-10-CM

## 2017-04-01 DIAGNOSIS — I700 Atherosclerosis of aorta: Secondary | ICD-10-CM | POA: Insufficient documentation

## 2017-04-01 DIAGNOSIS — M810 Age-related osteoporosis without current pathological fracture: Secondary | ICD-10-CM | POA: Insufficient documentation

## 2017-04-01 DIAGNOSIS — M7989 Other specified soft tissue disorders: Secondary | ICD-10-CM | POA: Diagnosis not present

## 2017-04-01 DIAGNOSIS — M65879 Other synovitis and tenosynovitis, unspecified ankle and foot: Secondary | ICD-10-CM | POA: Diagnosis not present

## 2017-04-01 LAB — CBC WITH DIFFERENTIAL/PLATELET
Basophils Absolute: 0.1 10*3/uL (ref 0–0.1)
Basophils Relative: 2 %
EOS ABS: 0.3 10*3/uL (ref 0–0.7)
EOS PCT: 5 %
HCT: 36.4 % — ABNORMAL LOW (ref 40.0–52.0)
Hemoglobin: 12.2 g/dL — ABNORMAL LOW (ref 13.0–18.0)
Lymphocytes Relative: 25 %
Lymphs Abs: 1.8 10*3/uL (ref 1.0–3.6)
MCH: 30.9 pg (ref 26.0–34.0)
MCHC: 33.6 g/dL (ref 32.0–36.0)
MCV: 92.1 fL (ref 80.0–100.0)
MONO ABS: 0.7 10*3/uL (ref 0.2–1.0)
MONOS PCT: 10 %
Neutro Abs: 4.3 10*3/uL (ref 1.4–6.5)
Neutrophils Relative %: 58 %
PLATELETS: 145 10*3/uL — AB (ref 150–440)
RBC: 3.96 MIL/uL — ABNORMAL LOW (ref 4.40–5.90)
RDW: 13.9 % (ref 11.5–14.5)
WBC: 7.3 10*3/uL (ref 3.8–10.6)

## 2017-04-01 LAB — COMPREHENSIVE METABOLIC PANEL
ALT: 11 U/L — AB (ref 17–63)
AST: 25 U/L (ref 15–41)
Albumin: 3.9 g/dL (ref 3.5–5.0)
Alkaline Phosphatase: 52 U/L (ref 38–126)
Anion gap: 12 (ref 5–15)
BUN: 21 mg/dL — AB (ref 6–20)
CO2: 20 mmol/L — AB (ref 22–32)
CREATININE: 0.95 mg/dL (ref 0.61–1.24)
Calcium: 8.9 mg/dL (ref 8.9–10.3)
Chloride: 104 mmol/L (ref 101–111)
GFR calc Af Amer: >60 mL/min (ref >60)
GLUCOSE: 143 mg/dL — AB (ref 65–99)
Potassium: 4.3 mmol/L (ref 3.5–5.1)
SODIUM: 136 mmol/L (ref 135–145)
Total Bilirubin: 0.5 mg/dL (ref 0.3–1.2)
Total Protein: 7 g/dL (ref 6.5–8.1)

## 2017-04-01 LAB — PSA: Prostatic Specific Antigen: 3.72 ng/mL (ref 0.00–4.00)

## 2017-04-01 MED ORDER — DENOSUMAB 60 MG/ML ~~LOC~~ SOLN
60.0000 mg | Freq: Once | SUBCUTANEOUS | Status: AC
  Administered 2017-04-01: 60 mg via SUBCUTANEOUS
  Filled 2017-04-01: qty 1

## 2017-04-01 MED ORDER — LEUPROLIDE ACETATE (3 MONTH) 22.5 MG IM KIT
22.5000 mg | PACK | Freq: Once | INTRAMUSCULAR | Status: AC
  Administered 2017-04-01: 22.5 mg via INTRAMUSCULAR
  Filled 2017-04-01: qty 22.5

## 2017-04-01 NOTE — Assessment & Plan Note (Addendum)
# #  CASTRATE RESISTANT PROSTATE CANCER- Metastatic disease with left bladder invasion/obstruction. PSA aug 10th 2018- 5.37. Re-started on Lupron 22.5 on 8/15 /2018.  OCT 30th PET scan-  Pelvic/RP LN; left later bladder wall- possibly leading to left ureter obstruction/hydronephrosis [C discussion below]  # Continue X-tandi [3 pills/day]  3.68/improved.  tolerating well except for mild fatigue. PSA-pending today.   #Discussed with pt/son again today that the treatments are palliative; and not curative.  Also discussed that the median the life expectancy of castrate resistant prostate cancer is approximately 2 years.  I do not think it is age he is a good candidate for chemotherapy.  Patient is on agree.  # Left hydronephrosis/? Extrinsic mass pushing on Left ureters s/p stenting exchange in  Jan 2019.  Hold off repeat imaging at this time unless patient's PSA significantly elevated or renal function is deteriorating.  # Severe Osteoporosis-/Castrate resistant prostate cancer [no bone mets] recommend prolia every 6 months [04/01/2017]; given his age.    # Iron deficiency anemia- question related to urinary blood loss. By mouth iron tolerating well. Hemoglobin 11.8.   # follow up in 6 weeks/labs- cbc/cmp/psa.

## 2017-04-01 NOTE — Progress Notes (Signed)
OFFICE PROGRESS NOTE  Patient Care Team: Venia Carbon, MD as PCP - General   SUMMARY OF ONCOLOGIC HISTORY: Oncology History    # 1996 PROSTATE CANCER [Gleason score 2+3] s/p Radical Prostatectomy [[facility]]; ?Biochem RECURRENT PROSTATE CANCER-Hormone sensitive;  Intermittent Lupron; on Lupron q 784M [Jan 2016 PSA- 7.9].  HOLD LUPRON in MAY 2017; AUg 2017- Re-start  # OCT 2018- Xtandi 3 pills a day; OCT 30th PET- Retroperitoneal/pelvic adenopathy/recurrence lateral bladder wall; NO Bone lesions.   # Osteoporosis [BMD- 3790]- on Reclast q 84M; FEB 2017- Start Prolia q 6 M     Prostate cancer ([place])    INTERVAL HISTORY:  A very pleasant 84 year old male patient with above history of  Castrate resistant recurrent prostate cancer- on Lupron; and also [name] [since October 2018] is here for follow-up.  Accompanied his son.   In the interim patient had his left ureteral stent exchanged; denies any pain.  Mild nausea after taking his Xtandi pills.  He is taking 3 pills once a day.  No unusual fatigue no falls.  No swelling of the legs.  Patient denies any new onset of abdominal pain vomiting or diarrhea.   REVIEW OF SYSTEMS:  A complete 10 point review of system is done which is negative except mentioned above/history of present illness.   PAST MEDICAL HISTORY :  Past Medical History:  Diagnosis Date  . Aortic atherosclerosis ([place])    Noted on CT  . Arthritis   . Colon polyps    adenomatous  . Dysrhythmia    pt unaware of this  . History of fracture of tibia   . History of tachycardia    patient unaware of this  . Kyphosis of cervicothoracic region    SEVERE  . Osteoporosis 01/2014   T -3.8 (01/2014)  . Other constipation    chronic  . Prostate cancer ([place])    surgery then lupron  . Unspecified venous (peripheral) insufficiency   . Vitamin B12 deficiency     PAST SURGICAL HISTORY :   Past Surgical History:  Procedure Laterality Date  . BASAL CELL CARCINOMA EXCISION   2004   Left side of face  . CATARACT EXTRACTION Bilateral 2000, 2003   both eyes  . COLONOSCOPY     h/o adenomatous polyps  . CYSTOSCOPY W/ RETROGRADES Bilateral 11/19/2016   Procedure: CYSTOSCOPY WITH RETROGRADE PYELOGRAM;  Surgeon: Hollice Espy, MD;  Location: [facility];  Service: Urology;  Laterality: Bilateral;  General vs. Spinal  . CYSTOSCOPY W/ URETERAL STENT PLACEMENT Left 02/25/2017   Procedure: CYSTOSCOPY WITH STENT REPLACEMENT;  Surgeon: Hollice Espy, MD;  Location: [facility];  Service: Urology;  Laterality: Left;  . CYSTOSCOPY WITH URETEROSCOPY AND STENT PLACEMENT Left 11/19/2016   Procedure: CYSTOSCOPY WITH URETEROSCOPY AND STENT PLACEMENT;  Surgeon: Hollice Espy, MD;  Location: [facility];  Service: Urology;  Laterality: Left;  . FRACTURE SURGERY    . JOINT REPLACEMENT  2005   Left knee  . JOINT REPLACEMENT  12/13   Right knee  . PROSTATE SURGERY  1996   cancer  . sigmoid resection    . TIBIA FRACTURE SURGERY  1999  . URETERAL BIOPSY Left 11/19/2016   Procedure: URETERAL BIOPSY;  Surgeon: Hollice Espy, MD;  Location: [facility];  Service: Urology;  Laterality: Left;  Marland Kitchen VOLVULUS REDUCTION  12/10    FAMILY HISTORY :   Family History  Problem Relation Age of Onset  . Diabetes Son   . Hypertension Paternal Grandfather   .  Prostate cancer Neg Hx   . Bladder Cancer Neg Hx   . Kidney cancer Neg Hx     SOCIAL HISTORY:   Social History   Tobacco Use  . Smoking status: Never Smoker  . Smokeless tobacco: Never Used  Substance Use Topics  . Alcohol use: Yes    Alcohol/week: 0.0 - 0.6 oz    Comment: in past  . Drug use: No    ALLERGIES:  is allergic to other.  MEDICATIONS:  Current Outpatient Medications  Medication Sig Dispense Refill  . Biotin 2.5 MG CAPS Take 2.5 mg by mouth daily.     . Calcium Carbonate-Vitamin D (CALCIUM-D) 600-400 MG-UNIT TABS Take 2 tablets by mouth daily.     . cholecalciferol (VITAMIN D) 1000 UNITS tablet Take 1,000 Units by mouth 2  (two) times daily.     . clindamycin (CLEOCIN) 150 MG capsule Take 500 mg by mouth See admin instructions. Take 4 capsules prior to dental work     . diphenhydrAMINE (BENADRYL) 25 mg capsule Take 25 mg by mouth every 4 (four) hours as needed for itching or allergies.    . enzalutamide (XTANDI) 40 MG capsule Take 3 capsules (120 mg total) by mouth daily. 90 capsule 4  . ferrous sulfate 325 (65 FE) MG tablet Take 325 mg by mouth 2 (two) times daily with a meal.    . ibuprofen (ADVIL,MOTRIN) 200 MG tablet Take 400 mg by mouth every 8 (eight) hours as needed for headache or mild pain.    Marland Kitchen latanoprost (XALATAN) 0.005 % ophthalmic solution Place 1 drop into both eyes at bedtime.    Marland Kitchen leuprolide (LUPRON) 30 MG injection Inject 30 mg into the muscle every 4 (four) months. Dose and interval not clear yet    . loratadine (CLARITIN) 10 MG tablet TAKE 1 TABLET DAILY 90 tablet 3  . nystatin (NYSTATIN) powder Apply topically 2 (two) times daily.     . polyethylene glycol powder (GLYCOLAX/MIRALAX) powder FILL TO LINE (17 GRAMS ), MIX AND DRINK TWICE A DAY AND 8.5 GRAMS AT NOON 510 g 3  . sennosides-docusate sodium (SENOKOT-S) 8.6-50 MG tablet Take 2 tablets by mouth every evening.     . triamcinolone cream (KENALOG) 0.1 % Apply 1 application topically every other day. Alternating with Ciclopirox    . vitamin B-12 (CYANOCOBALAMIN) 1000 MCG tablet Take 1,000 mcg by mouth daily.    . vitamin C (ASCORBIC ACID) 500 MG tablet Take 500 mg by mouth daily.    . zoledronic acid (RECLAST) 5 MG/100ML SOLN injection Inject 5 mg into the vein every 6 (six) months.     . traMADol (ULTRAM) 50 MG tablet Take 1 tablet (50 mg total) by mouth 3 (three) times daily as needed. (Patient not taking: Reported on 02/25/2017) 270 tablet 0   No current facility-administered medications for this visit.    Facility-Administered Medications Ordered in Other Visits  Medication Dose Route Frequency Provider Last Rate Last Dose  . leuprolide  (LUPRON) injection 30 mg  30 mg Intramuscular Once Leia Alf, MD        PHYSICAL EXAMINATION: ECOG PERFORMANCE STATUS: 1 - Symptomatic but completely ambulatory  BP 116/64   Pulse 71   Temp 97.9 F (36.6 C) (Tympanic)   Resp 20   There were no vitals filed for this visit.  GENERAL: Well-nourished well-developed; Alert, no distress and comfortable.  With his son.  Walks with a rolling walker EYES: no pallor or icterus OROPHARYNX: no thrush  or ulceration; good dentition  NECK: supple, no masses felt LYMPH:  no palpable lymphadenopathy in the cervical, axillary or inguinal regions LUNGS: clear to auscultation and  No wheeze or crackles HEART/CVS: regular rate & rhythm and no murmurs; No lower extremity edema ABDOMEN:abdomen soft, non-tender and normal bowel sounds Musculoskeletal:no cyanosis of digits and no clubbing  PSYCH: alert & oriented x 3 with fluent speech NEURO: no focal motor/sensory deficits SKIN:  no rashes or significant lesions  LABORATORY DATA:  I have reviewed the data as listed    Component Value Date/Time   NA 136 04/01/2017 1112   NA 140 02/11/2012 0415   K 4.3 04/01/2017 1112   K 3.9 02/11/2012 0415   CL 104 04/01/2017 1112   CL 108 (H) 02/11/2012 0415   CO2 20 (L) 04/01/2017 1112   CO2 25 02/11/2012 0415   GLUCOSE 143 (H) 04/01/2017 1112   GLUCOSE 125 (H) 02/11/2012 0415   BUN 21 (H) 04/01/2017 1112   BUN 12 02/11/2012 0415   CREATININE 0.95 04/01/2017 1112   CREATININE 1.00 03/23/2014 1400   CALCIUM 8.9 04/01/2017 1112   CALCIUM 8.3 (L) 03/23/2014 1400   PROT 7.0 04/01/2017 1112   PROT 7.0 03/07/2014 1414   ALBUMIN 3.9 04/01/2017 1112   ALBUMIN 3.7 03/07/2014 1414   AST 25 04/01/2017 1112   AST 16 03/07/2014 1414   ALT 11 (L) 04/01/2017 1112   ALT 19 03/07/2014 1414   ALKPHOS 52 04/01/2017 1112   ALKPHOS 69 03/07/2014 1414   BILITOT 0.5 04/01/2017 1112   BILITOT 0.3 03/07/2014 1414   GFRNONAA >60 04/01/2017 1112   GFRNONAA >60  03/23/2014 1400   GFRNONAA 49 (L) 09/02/2013 1428   GFRAA >60 04/01/2017 1112   GFRAA >60 03/23/2014 1400   GFRAA 57 (L) 09/02/2013 1428    No results found for: SPEP, UPEP  Lab Results  Component Value Date   WBC 7.3 04/01/2017   NEUTROABS 4.3 04/01/2017   HGB 12.2 (L) 04/01/2017   HCT 36.4 (L) 04/01/2017   MCV 92.1 04/01/2017   PLT 145 (L) 04/01/2017      Chemistry      Component Value Date/Time   NA 136 04/01/2017 1112   NA 140 02/11/2012 0415   K 4.3 04/01/2017 1112   K 3.9 02/11/2012 0415   CL 104 04/01/2017 1112   CL 108 (H) 02/11/2012 0415   CO2 20 (L) 04/01/2017 1112   CO2 25 02/11/2012 0415   BUN 21 (H) 04/01/2017 1112   BUN 12 02/11/2012 0415   CREATININE 0.95 04/01/2017 1112   CREATININE 1.00 03/23/2014 1400   GLU 114 06/30/2016      Component Value Date/Time   CALCIUM 8.9 04/01/2017 1112   CALCIUM 8.3 (L) 03/23/2014 1400   ALKPHOS 52 04/01/2017 1112   ALKPHOS 69 03/07/2014 1414   AST 25 04/01/2017 1112   AST 16 03/07/2014 1414   ALT 11 (L) 04/01/2017 1112   ALT 19 03/07/2014 1414   BILITOT 0.5 04/01/2017 1112   BILITOT 0.3 03/07/2014 1414     Results for RICHARDS, PHERIGO (MRN [id number]) as of 12/29/2016 10:34  Ref. Range 04/04/2016 09:56 07/02/2016 13:44 10/03/2016 13:41 11/03/2016 09:49 12/01/2016 14:12  PSA Latest Ref Range: 0.00 - 4.00 ng/mL 2.23 3.98     Prostatic Specific Antigen Latest Ref Range: 0.00 - 4.00 ng/mL   5.37 (H) 8.02 (H) 8.99 (H)    IMPRESSION: 1. Radiotracer accumulation with in LEFT periaortic lymph nodes and LEFT common  iliac lymph nodes consistent with prostate cancer metastasis. 2. Focal activity along the posterior LEFT aspect of the bladder is concerning for local prostate cancer invasion. 3. No evidence skeletal metastasis.   Electronically Signed   By: Suzy Bouchard M.D.   On: 12/23/2016 15:20   Results for JESHURUN, OAXACA (MRN [id number]) as of 02/09/2017 15:11  Ref. Range 09/10/2009 10:18 12/11/2009 10:02 03/13/2010  15:19 06/12/2010 14:49 09/11/2010 14:53 12/11/2010 14:13 03/12/2011 14:15 06/11/2011 10:02 09/10/2011 14:18 12/10/2011 10:09 03/31/2012 15:09 06/30/2012 14:41 09/01/2012 14:23 12/02/2012 14:49 03/04/2013 15:44 06/02/2013 09:58 09/02/2013 14:28 12/05/2013 14:27 03/07/2014 14:14 07/25/2014 09:57 11/20/2014 10:35 03/28/2015 10:10 06/25/2015 10:10 10/03/2015 10:22 01/03/2016 09:50 04/04/2016 09:56 07/02/2016 13:44 10/03/2016 13:41 11/03/2016 09:49 12/01/2016 14:12 12/29/2016 09:38  PSA Latest Ref Range: 0.00 - 4.00 ng/mL ========== TEST N... ========== TEST N... ========== TEST N... ========== TEST N... 0.7 0.9 1.1 1.2 1.4 1.2 1.3 1.4 1.7 1.9 2.2 2.6 3.4 5.4 (H) 7.9 (H) 3.24 3.33 3.20 2.50 2.19 2.35 2.23 3.98      Prostatic Specific Antigen Latest Ref Range: 0.00 - 4.00 ng/mL                            5.37 (H) 8.02 (H) 8.99 (H) 2.53    ASSESSMENT & PLAN:   Prostate cancer ([place]) # #CASTRATE RESISTANT PROSTATE CANCER- Metastatic disease with left bladder invasion/obstruction. PSA aug 10th 2018- 5.37. Re-started on Lupron 22.5 on 8/15 /2018.  OCT 30th PET scan-  Pelvic/RP LN; left later bladder wall- possibly leading to left ureter obstruction/hydronephrosis [C discussion below]  # Continue X-tandi [3 pills/day]  3.68/improved.  tolerating well except for mild fatigue. PSA-pending today.   #Discussed with pt/son again today that the treatments are palliative; and not curative.  Also discussed that the median the life expectancy of castrate resistant prostate cancer is approximately 2 years.  I do not think it is age he is a good candidate for chemotherapy.  Patient is on agree.  # Left hydronephrosis/? Extrinsic mass pushing on Left ureters s/p stenting exchange in  Jan 2019.  Hold off repeat imaging at this time unless patient's PSA significantly elevated or renal function is deteriorating.  # Severe Osteoporosis-/Castrate resistant prostate cancer [no bone mets] recommend prolia every 6 months [04/01/2017]; given his age.    # Iron  deficiency anemia- question related to urinary blood loss. By mouth iron tolerating well. Hemoglobin 11.8.   # follow up in 6 weeks/labs- cbc/cmp/psa.     Cammie Sickle, MD 04/01/2017 12:45 PM

## 2017-04-08 ENCOUNTER — Ambulatory Visit: Payer: Medicare Other | Admitting: Internal Medicine

## 2017-04-08 VITALS — BP 112/72 | HR 72 | Temp 97.7°F | Resp 20 | Wt 183.2 lb

## 2017-04-08 DIAGNOSIS — M79672 Pain in left foot: Secondary | ICD-10-CM | POA: Diagnosis not present

## 2017-04-08 DIAGNOSIS — L989 Disorder of the skin and subcutaneous tissue, unspecified: Secondary | ICD-10-CM

## 2017-04-08 DIAGNOSIS — R2242 Localized swelling, mass and lump, left lower limb: Secondary | ICD-10-CM | POA: Diagnosis not present

## 2017-04-08 DIAGNOSIS — I872 Venous insufficiency (chronic) (peripheral): Secondary | ICD-10-CM

## 2017-04-08 DIAGNOSIS — C61 Malignant neoplasm of prostate: Secondary | ICD-10-CM | POA: Diagnosis not present

## 2017-04-08 DIAGNOSIS — M79605 Pain in left leg: Secondary | ICD-10-CM | POA: Diagnosis not present

## 2017-04-08 DIAGNOSIS — M818 Other osteoporosis without current pathological fracture: Secondary | ICD-10-CM

## 2017-04-08 DIAGNOSIS — L539 Erythematous condition, unspecified: Secondary | ICD-10-CM | POA: Diagnosis not present

## 2017-04-08 DIAGNOSIS — K5909 Other constipation: Secondary | ICD-10-CM

## 2017-04-08 MED FILL — XTANDI 40 MG CAPSULE: 40 | 30 days supply | Qty: 90 | Fill #4

## 2017-04-09 ENCOUNTER — Other Ambulatory Visit: Payer: Self-pay

## 2017-04-09 DIAGNOSIS — S92902G Unspecified fracture of left foot, subsequent encounter for fracture with delayed healing: Secondary | ICD-10-CM

## 2017-04-09 NOTE — Progress Notes (Unsigned)
No precert required 

## 2017-04-15 ENCOUNTER — Ambulatory Visit
Admission: RE | Admit: 2017-04-15 | Discharge: 2017-04-15 | Disposition: A | Discharge status: Home / Self Care | Payer: Medicare Other | Source: Ambulatory Visit | Attending: Podiatry | Admitting: Podiatry

## 2017-04-15 DIAGNOSIS — X58XXXD Exposure to other specified factors, subsequent encounter: Secondary | ICD-10-CM | POA: Insufficient documentation

## 2017-04-15 DIAGNOSIS — M65872 Other synovitis and tenosynovitis, left ankle and foot: Secondary | ICD-10-CM | POA: Insufficient documentation

## 2017-04-15 DIAGNOSIS — S92515G Nondisplaced fracture of proximal phalanx of left lesser toe(s), subsequent encounter for fracture with delayed healing: Secondary | ICD-10-CM | POA: Insufficient documentation

## 2017-04-15 DIAGNOSIS — R600 Localized edema: Secondary | ICD-10-CM | POA: Insufficient documentation

## 2017-04-15 DIAGNOSIS — S92902G Unspecified fracture of left foot, subsequent encounter for fracture with delayed healing: Secondary | ICD-10-CM | POA: Diagnosis present

## 2017-04-20 ENCOUNTER — Other Ambulatory Visit: Payer: Self-pay

## 2017-04-20 ENCOUNTER — Inpatient Hospital Stay (HOSPITAL_BASED_OUTPATIENT_CLINIC_OR_DEPARTMENT_OTHER): Payer: Medicare Other | Admitting: Oncology

## 2017-04-20 ENCOUNTER — Ambulatory Visit
Admission: RE | Admit: 2017-04-20 | Discharge: 2017-04-20 | Disposition: A | Discharge status: Home / Self Care | Payer: Medicare Other | Source: Ambulatory Visit | Attending: Oncology | Admitting: Oncology

## 2017-04-20 ENCOUNTER — Telehealth: Payer: Self-pay | Admitting: *Deleted

## 2017-04-20 VITALS — BP 116/63 | HR 71 | Temp 97.9°F | Resp 16 | Wt 186.0 lb

## 2017-04-20 DIAGNOSIS — Z85828 Personal history of other malignant neoplasm of skin: Secondary | ICD-10-CM

## 2017-04-20 DIAGNOSIS — E538 Deficiency of other specified B group vitamins: Secondary | ICD-10-CM

## 2017-04-20 DIAGNOSIS — Z79899 Other long term (current) drug therapy: Secondary | ICD-10-CM

## 2017-04-20 DIAGNOSIS — Z79818 Long term (current) use of other agents affecting estrogen receptors and estrogen levels: Secondary | ICD-10-CM | POA: Diagnosis not present

## 2017-04-20 DIAGNOSIS — M7989 Other specified soft tissue disorders: Secondary | ICD-10-CM

## 2017-04-20 DIAGNOSIS — M810 Age-related osteoporosis without current pathological fracture: Secondary | ICD-10-CM | POA: Diagnosis not present

## 2017-04-20 DIAGNOSIS — M65879 Other synovitis and tenosynovitis, unspecified ankle and foot: Secondary | ICD-10-CM | POA: Diagnosis not present

## 2017-04-20 DIAGNOSIS — R1909 Other intra-abdominal and pelvic swelling, mass and lump: Secondary | ICD-10-CM | POA: Diagnosis not present

## 2017-04-20 DIAGNOSIS — I700 Atherosclerosis of aorta: Secondary | ICD-10-CM

## 2017-04-20 DIAGNOSIS — L03818 Cellulitis of other sites: Secondary | ICD-10-CM

## 2017-04-20 DIAGNOSIS — Z192 Hormone resistant malignancy status: Secondary | ICD-10-CM

## 2017-04-20 DIAGNOSIS — I739 Peripheral vascular disease, unspecified: Secondary | ICD-10-CM

## 2017-04-20 DIAGNOSIS — M40203 Unspecified kyphosis, cervicothoracic region: Secondary | ICD-10-CM

## 2017-04-20 DIAGNOSIS — Z86010 Personal history of colonic polyps: Secondary | ICD-10-CM

## 2017-04-20 DIAGNOSIS — R600 Localized edema: Secondary | ICD-10-CM | POA: Diagnosis not present

## 2017-04-20 DIAGNOSIS — C778 Secondary and unspecified malignant neoplasm of lymph nodes of multiple regions: Secondary | ICD-10-CM | POA: Diagnosis not present

## 2017-04-20 DIAGNOSIS — M129 Arthropathy, unspecified: Secondary | ICD-10-CM

## 2017-04-20 DIAGNOSIS — D509 Iron deficiency anemia, unspecified: Secondary | ICD-10-CM | POA: Diagnosis not present

## 2017-04-20 DIAGNOSIS — C61 Malignant neoplasm of prostate: Secondary | ICD-10-CM | POA: Diagnosis not present

## 2017-04-20 DIAGNOSIS — R110 Nausea: Secondary | ICD-10-CM | POA: Diagnosis not present

## 2017-04-20 DIAGNOSIS — R21 Rash and other nonspecific skin eruption: Secondary | ICD-10-CM | POA: Diagnosis not present

## 2017-04-20 DIAGNOSIS — K5900 Constipation, unspecified: Secondary | ICD-10-CM

## 2017-04-20 MED ORDER — CEPHALEXIN 500 MG PO CAPS: 500.0000 mg | ORAL_CAPSULE | Freq: Three times a day (TID) | ORAL | 0 refills | Status: DC

## 2017-04-20 NOTE — Telephone Encounter (Signed)
Daughter called to report that his left leg is swollen,  he had MRI by Dr Prudence Davidson 2/20 for pain and swelling of the foot but the swelling has now gone up his leg into the thigh. She is concerned about this and will e mail me a picture of his leg. Please advise.

## 2017-04-20 NOTE — Telephone Encounter (Signed)
Patient coming in at 145 to see [facility] NP [place] after discussing with L Allen,NP

## 2017-04-20 NOTE — Progress Notes (Signed)
Symptom Management Consult note [facility]  [phone] [phone]  Patient Care Team: Venia Carbon, MD as PCP - General   Name of the patient: Benjamin Villarreal  [id number]  1921/02/15   Date of visit: 04/21/17  Diagnosis-  Prostate Cancer   Chief complaint/ Reason for visit- Left leg swelling  Heme/Onc history: # 1996 PROSTATE CANCER [Gleason score 2+3] s/p Radical Prostatectomy [[facility]]; ?Biochem RECURRENT PROSTATE CANCER-Hormone sensitive;  Intermittent Lupron; on Lupron q 25M [Jan 2016 PSA- 7.9].  HOLD LUPRON in MAY 2017; AUg 2017- Re-start # OCT 2018- Xtandi 3 pills a day; OCT 30th PET- Retroperitoneal/pelvic adenopathy/recurrence lateral bladder wall; NO Bone lesions.  # Osteoporosis [BMD- 9470]- on Reclast q 76M; FE # #CASTRATE RESISTANT PROSTATE CANCER- Metastatic disease with left bladder invasion/obstruction. PSA aug 10th 2018- 5.37. Re-started on Lupron 22.5 on 8/15 /2018.  OCT 30th PET scan-  Pelvic/RP LN; left later bladder wall- possibly leading to left ureter obstruction/hydronephrosis [C discussion below]  Interval history- The patient was recently seen by Dr. Rogue Bussing on 04/01/2017 for follow-up after beginning Xtandi 3 pills daily. He admitted to occasional nausea after taking the pills but otherwise felt well.He had recently had his left ureteral stent exchanged for extrinsic mass that appears to be pushing on left ureters causing possible left hydronephrosis. They discussed holding off on repeat imaging unless kidney function deteriorates or PSA rises. He was to follow-up in 6 weeks.  Patient presents today for left lower extremity swelling. He noticed swelling to his left foot late last year when he was told he had broken 2 toes on left foot. X-rays revealed fracture of the fourth digit left foot and fracture of the head of the third metatarsalis also on the left foot. He was told to wear an ankle boot on the foot to control  the swelling. The swelling had been controlled until approximately 1 week ago when he noticed worsening swelling up his calf extending to his knee causing discomfort. The daughter called this morning because she noticed how much larger his leg appeared. He has been wearing his TED hose and ankle boot daily. He recently had an MRI of left foot showing swelling in the dorsum of the foot extending to the toes and mild tenosynovitis.   The edema has been moderate.  Onset of symptoms was 7 days ago, gradually worsening since that time. The edema is present all day. The patient states never.  The swelling has been aggravated by nothing, relieved by nothing, and been associated with recent injury two broken toes.    ECOG FS:1 - Symptomatic but completely ambulatory  Review of systems- Review of Systems  Constitutional: Negative.  Negative for chills, fever, malaise/fatigue and weight loss.  HENT: Negative for congestion and ear pain.   Eyes: Negative.  Negative for blurred vision and double vision.  Respiratory: Negative.  Negative for cough, sputum production and shortness of breath.   Cardiovascular: Positive for leg swelling (left leg). Negative for chest pain and palpitations.  Gastrointestinal: Negative.  Negative for abdominal pain, constipation, diarrhea, nausea and vomiting.  Genitourinary: Negative for dysuria, frequency and urgency.  Musculoskeletal: Negative for back pain and falls.  Skin: Negative.  Negative for rash.  Neurological: Negative.  Negative for weakness and headaches.  Endo/Heme/Allergies: Negative.  Does not bruise/bleed easily.  Psychiatric/Behavioral: Negative.  Negative for depression. The patient is not nervous/anxious and does not have insomnia.      Current treatment- Xtandi 3 pills  daily  Allergies  Allergen Reactions  . Other     RAW ONIONS- SINUS REACTION     Past Medical History:  Diagnosis Date  . Aortic atherosclerosis ([place])    Noted on CT  . Arthritis    . Colon polyps    adenomatous  . Dysrhythmia    pt unaware of this  . History of fracture of tibia   . History of tachycardia    patient unaware of this  . Kyphosis of cervicothoracic region    SEVERE  . Osteoporosis 01/2014   T -3.8 (01/2014)  . Other constipation    chronic  . Prostate cancer ([place])    surgery then lupron  . Unspecified venous (peripheral) insufficiency   . Vitamin B12 deficiency      Past Surgical History:  Procedure Laterality Date  . BASAL CELL CARCINOMA EXCISION  2004   Left side of face  . CATARACT EXTRACTION Bilateral 2000, 2003   both eyes  . COLONOSCOPY     h/o adenomatous polyps  . CYSTOSCOPY W/ RETROGRADES Bilateral 11/19/2016   Procedure: CYSTOSCOPY WITH RETROGRADE PYELOGRAM;  Surgeon: Hollice Espy, MD;  Location: [facility];  Service: Urology;  Laterality: Bilateral;  General vs. Spinal  . CYSTOSCOPY W/ URETERAL STENT PLACEMENT Left 02/25/2017   Procedure: CYSTOSCOPY WITH STENT REPLACEMENT;  Surgeon: Hollice Espy, MD;  Location: [facility];  Service: Urology;  Laterality: Left;  . CYSTOSCOPY WITH URETEROSCOPY AND STENT PLACEMENT Left 11/19/2016   Procedure: CYSTOSCOPY WITH URETEROSCOPY AND STENT PLACEMENT;  Surgeon: Hollice Espy, MD;  Location: [facility];  Service: Urology;  Laterality: Left;  . FRACTURE SURGERY    . JOINT REPLACEMENT  2005   Left knee  . JOINT REPLACEMENT  12/13   Right knee  . PROSTATE SURGERY  1996   cancer  . sigmoid resection    . TIBIA FRACTURE SURGERY  1999  . URETERAL BIOPSY Left 11/19/2016   Procedure: URETERAL BIOPSY;  Surgeon: Hollice Espy, MD;  Location: [facility];  Service: Urology;  Laterality: Left;  Marland Kitchen VOLVULUS REDUCTION  12/10    Social History   Socioeconomic History  . Marital status: Widowed    Spouse name: [name]  . Number of children: 3  . Years of education: Not on file  . Highest education level: Not on file  Social Needs  . Financial resource strain: Not on file  . Food insecurity -  worry: Not on file  . Food insecurity - inability: Not on file  . Transportation needs - medical: Not on file  . Transportation needs - non-medical: Not on file  Occupational History  . Occupation: Research scientist (physical sciences)    Comment: retired  Tobacco Use  . Smoking status: Never Smoker  . Smokeless tobacco: Never Used  Substance and Sexual Activity  . Alcohol use: Yes    Alcohol/week: 0.0 - 0.6 oz    Comment: in past  . Drug use: No  . Sexual activity: Not on file  Other Topics Concern  . Not on file  Social History Narrative   Has living will   Daughter [name] holds health care POA.   Has DNR and we have reviewed this.   Would not want tube feedings if cognitively unaware    Family History  Problem Relation Age of Onset  . Diabetes Son   . Hypertension Paternal Grandfather   . Prostate cancer Neg Hx   . Bladder Cancer Neg Hx   . Kidney cancer Neg Hx  Current Outpatient Medications:  .  Biotin 2.5 MG CAPS, Take 2.5 mg by mouth daily. , Disp: , Rfl:  .  Calcium Carbonate-Vitamin D (CALCIUM-D) 600-400 MG-UNIT TABS, Take 2 tablets by mouth daily. , Disp: , Rfl:  .  cholecalciferol (VITAMIN D) 1000 UNITS tablet, Take 1,000 Units by mouth 2 (two) times daily. , Disp: , Rfl:  .  clindamycin (CLEOCIN) 150 MG capsule, Take 500 mg by mouth See admin instructions. Take 4 capsules prior to dental work , Disp: , Rfl:  .  diphenhydrAMINE (BENADRYL) 25 mg capsule, Take 25 mg by mouth every 4 (four) hours as needed for itching or allergies., Disp: , Rfl:  .  enzalutamide (XTANDI) 40 MG capsule, Take 3 capsules (120 mg total) by mouth daily., Disp: 90 capsule, Rfl: 4 .  ferrous sulfate 325 (65 FE) MG tablet, Take 325 mg by mouth 2 (two) times daily with a meal., Disp: , Rfl:  .  ibuprofen (ADVIL,MOTRIN) 200 MG tablet, Take 400 mg by mouth every 8 (eight) hours as needed for headache or mild pain., Disp: , Rfl:  .  latanoprost (XALATAN) 0.005 % ophthalmic solution, Place 1 drop into  both eyes at bedtime., Disp: , Rfl:  .  leuprolide (LUPRON) 30 MG injection, Inject 30 mg into the muscle every 4 (four) months. Dose and interval not clear yet, Disp: , Rfl:  .  loratadine (CLARITIN) 10 MG tablet, TAKE 1 TABLET DAILY, Disp: 90 tablet, Rfl: 3 .  nystatin (NYSTATIN) powder, Apply topically 2 (two) times daily. , Disp: , Rfl:  .  polyethylene glycol powder (GLYCOLAX/MIRALAX) powder, FILL TO LINE (17 GRAMS ), MIX AND DRINK TWICE A DAY AND 8.5 GRAMS AT NOON, Disp: 510 g, Rfl: 3 .  sennosides-docusate sodium (SENOKOT-S) 8.6-50 MG tablet, Take 2 tablets by mouth every evening. , Disp: , Rfl:  .  traMADol (ULTRAM) 50 MG tablet, Take 1 tablet (50 mg total) by mouth 3 (three) times daily as needed., Disp: 270 tablet, Rfl: 0 .  triamcinolone cream (KENALOG) 0.1 %, Apply 1 application topically every other day. Alternating with Ciclopirox, Disp: , Rfl:  .  vitamin B-12 (CYANOCOBALAMIN) 1000 MCG tablet, Take 1,000 mcg by mouth daily., Disp: , Rfl:  .  vitamin C (ASCORBIC ACID) 500 MG tablet, Take 500 mg by mouth daily., Disp: , Rfl:  .  zoledronic acid (RECLAST) 5 MG/100ML SOLN injection, Inject 5 mg into the vein every 6 (six) months. , Disp: , Rfl:  .  cephALEXin (KEFLEX) 500 MG capsule, Take 1 capsule (500 mg total) by mouth 3 (three) times daily., Disp: 30 capsule, Rfl: 0 No current facility-administered medications for this visit.   Facility-Administered Medications Ordered in Other Visits:  .  leuprolide (LUPRON) injection 30 mg, 30 mg, Intramuscular, Once, Leia Alf, MD  Physical exam:  Vitals:   04/20/17 1333  BP: 116/63  Pulse: 71  Resp: 16  Temp: 97.9 F (36.6 C)  Weight: 186 lb (84.4 kg)   Physical Exam  Constitutional: He is oriented to person, place, and time and well-developed, well-nourished, and in no distress. Vital signs are normal.  HENT:  Head: Normocephalic and atraumatic.  Eyes: Pupils are equal, round, and reactive to light.  Neck: Normal range of  motion.  Cardiovascular: Normal rate, regular rhythm and normal heart sounds.  No murmur heard. Pulmonary/Chest: Effort normal and breath sounds normal. He has no wheezes.  Abdominal: Soft. Normal appearance and bowel sounds are normal. He exhibits no distension. There is  no tenderness.  Musculoskeletal: Normal range of motion. He exhibits no edema.       Left knee: He exhibits swelling and erythema. Tenderness found.       Left ankle: He exhibits swelling. Tenderness. Achilles tendon normal.  Neurological: He is alert and oriented to person, place, and time. Gait normal.  Skin: Skin is warm and dry. Rash noted. There is erythema.     Psychiatric: Mood, memory, affect and judgment normal.     CMP Latest Ref Rng & Units 04/01/2017  Glucose 65 - 99 mg/dL 143(H)  BUN 6 - 20 mg/dL 21(H)  Creatinine 0.61 - 1.24 mg/dL 0.95  Sodium 135 - 145 mmol/L 136  Potassium 3.5 - 5.1 mmol/L 4.3  Chloride 101 - 111 mmol/L 104  CO2 22 - 32 mmol/L 20(L)  Calcium 8.9 - 10.3 mg/dL 8.9  Total Protein 6.5 - 8.1 g/dL 7.0  Total Bilirubin 0.3 - 1.2 mg/dL 0.5  Alkaline Phos 38 - 126 U/L 52  AST 15 - 41 U/L 25  ALT 17 - 63 U/L 11(L)   CBC Latest Ref Rng & Units 04/01/2017  WBC 3.8 - 10.6 K/uL 7.3  Hemoglobin 13.0 - 18.0 g/dL 12.2(L)  Hematocrit 40.0 - 52.0 % 36.4(L)  Platelets 150 - 440 K/uL 145(L)    No images are attached to the encounter.  Mr Foot Left Wo Contrast  Result Date: 04/16/2017 CLINICAL DATA:  Pain in foot, prior fracture, toe pain and swelling. EXAM: MRI OF THE LEFT FOOT WITHOUT CONTRAST TECHNIQUE: Multiplanar, multisequence MR imaging of the left forefoot was performed. No intravenous contrast was administered. COMPARISON:  Multiple exams, including radiographs from 03/23/2017 FINDINGS: Bones/Joint/Cartilage Prior fracture of the distal metaphysis of the proximal phalanx fourth toe with ill definition of the distal fourth toe proximal phalanx head likely related to partial absorption and  healing response, although without persistent marrow edema in this vicinity on inversion recovery weighted images. Interphalangeal loss of articular space, likely degenerative. No malalignment at the Lisfranc joint. First digit sesamoids unremarkable. Ligaments Lisfranc ligament intact on image 16/7. Muscles and Tendons Low-level edema tracks along the plantar musculotendinous structures. Mild flexor hallucis longus tenosynovitis at the knot of Henry's. Soft tissues Diffuse subcutaneous edema along the dorsum of the foot tracking into all of the toes, especially dorsally. IMPRESSION: 1. Prior fracture of the distal metaphysis of the proximal phalanx fourth toe, with poor definition of the small distal fracture fragment, but no significant degree of current marrow edema in this vicinity. No other fracture identified. 2. Subcutaneous edema in the dorsum of the foot extending into the toes. 3. Mild flexor hallucis longus tenosynovitis. 4. Low-level edema tracks along the plantar musculotendinous structures. Electronically Signed   By: Van Clines M.D.   On: 04/16/2017 07:57   US Venous Img Lower Unilateral Left  Result Date: 04/20/2017 CLINICAL DATA:  Left lower extremity swelling and pain for several weeks EXAM: LEFT LOWER EXTREMITY VENOUS DOPPLER ULTRASOUND TECHNIQUE: Gray-scale sonography with graded compression, as well as color Doppler and duplex ultrasound were performed to evaluate the lower extremity deep venous systems from the level of the common femoral vein and including the common femoral, femoral, profunda femoral, popliteal and calf veins including the posterior tibial, peroneal and gastrocnemius veins when visible. The superficial great saphenous vein was also interrogated. Spectral Doppler was utilized to evaluate flow at rest and with distal augmentation maneuvers in the common femoral, femoral and popliteal veins. COMPARISON:  None. FINDINGS: Contralateral Common Femoral Vein: Respiratory  phasicity is normal and symmetric with the symptomatic side. No evidence of thrombus. Normal compressibility. Common Femoral Vein: No evidence of thrombus. Normal compressibility, respiratory phasicity and response to augmentation. Saphenofemoral Junction: No evidence of thrombus. Normal compressibility and flow on color Doppler imaging. Profunda Femoral Vein: No evidence of thrombus. Normal compressibility and flow on color Doppler imaging. Femoral Vein: No evidence of thrombus. Normal compressibility, respiratory phasicity and response to augmentation. Popliteal Vein: No evidence of thrombus. Normal compressibility, respiratory phasicity and response to augmentation. Calf Veins: Limited assessment of the calf veins secondary to peripheral edema. No gross occlusive thrombus. Superficial Great Saphenous Vein: No evidence of thrombus. Normal compressibility. Venous Reflux:  None. Other Findings:  Subcutaneous peripheral calf edema. IMPRESSION: No significant femoropopliteal DVT. Limited assessment of the calf veins. Subcutaneous peripheral edema. Electronically Signed   By: Jerilynn Mages.  Shick M.D.   On: 04/20/2017 15:26   Dg Foot Complete Left  Result Date: 03/23/2017 Please see detailed radiograph report in office note.    Assessment and plan- Patient is a 84 y.o. male who presents for worsening left lower extremity swelling. On initial assessment, left lower leg was covered with TED hose and a ankle boot. Patient sitting quietly in exam chair. He does not appear to be in pain. Vital signs are stable. He is afebrile. +2 pitting edema from top of foot extending to left knee. Skin feels tight. Skin is warm. Small areas of erythema noted on left lateral aspect of ankle. There does not appear to be any open wounds. Small closed lesion present with scabbing. Toes are edematous but movable. No bruising noted. Pedal, posterior tibialis and popliteal pulses identified in left leg. No tender areas noted.   1. STAT Venous  Doppler of Left lower extremity. Patient has several risk factors including recent fracture of left toes,  inactivity and prostate cancer. Results:  No significant femoral popliteal DVT. Limited assessment of the calf veins and subcutaneous peripheral edema. Patient notified of results. 2. Rash/cellulitis/erythema on the left ankle and calf: Given patient has had a noticeable increase in swelling in the left ankle extending to the knee,  will try an antibiotic such as Keflex 500 mg 3 times a day for 7 days to see if this helps with the swelling. He has been afebrile but leg is warm to touch and there are several areas of erythema. Will check back with patient in the next couple days to see if this is improving. 3. RTC as scheduled to see Dr. Rogue Bussing in March 2019.     Visit Diagnosis 1. Swelling of left lower extremity   2. Cellulitis of other specified site     Patient expressed understanding and was in agreement with this plan. He also understands that He can call clinic at any time with any questions, concerns, or complaints.   Greater than 50% was spent in counseling and coordination of care with this patient including but not limited to discussion of the relevant topics above (See A&P) including, but not limited to diagnosis and management of acute and chronic medical conditions.    Faythe Casa, AGNP-C [facility]- [phone] Pager- [phone] 04/21/2017 10:03 AM

## 2017-04-22 DIAGNOSIS — Z833 Family history of diabetes mellitus: Secondary | ICD-10-CM | POA: Diagnosis not present

## 2017-04-22 DIAGNOSIS — L820 Inflamed seborrheic keratosis: Secondary | ICD-10-CM | POA: Diagnosis not present

## 2017-04-22 DIAGNOSIS — L821 Other seborrheic keratosis: Secondary | ICD-10-CM | POA: Diagnosis not present

## 2017-04-22 DIAGNOSIS — L03115 Cellulitis of right lower limb: Secondary | ICD-10-CM | POA: Diagnosis not present

## 2017-04-29 ENCOUNTER — Telehealth: Payer: Self-pay | Admitting: Podiatry

## 2017-04-29 DIAGNOSIS — L03116 Cellulitis of left lower limb: Secondary | ICD-10-CM | POA: Insufficient documentation

## 2017-04-29 DIAGNOSIS — L309 Dermatitis, unspecified: Secondary | ICD-10-CM | POA: Diagnosis not present

## 2017-04-29 NOTE — Telephone Encounter (Signed)
Patients daughter called from [place] to advise the patient had developed an infection in his leg and was seen at the urgent care of [facility] over the weekend. She advised he is now established with [facility] and would not need to keep his appointment for the MRI results with Dr. Prudence Davidson that is scheduled for 04/30/17. Per her request appointment with Prudence Davidson has been cancelled.

## 2017-04-30 ENCOUNTER — Other Ambulatory Visit: Payer: Self-pay | Admitting: Internal Medicine

## 2017-04-30 ENCOUNTER — Ambulatory Visit: Payer: Medicare Other | Admitting: Podiatry

## 2017-04-30 DIAGNOSIS — C61 Malignant neoplasm of prostate: Secondary | ICD-10-CM

## 2017-05-01 ENCOUNTER — Encounter: Payer: Self-pay | Admitting: *Deleted

## 2017-05-01 ENCOUNTER — Inpatient Hospital Stay: Payer: Medicare Other

## 2017-05-01 ENCOUNTER — Other Ambulatory Visit: Payer: Self-pay

## 2017-05-01 ENCOUNTER — Encounter: Payer: Self-pay | Admitting: Internal Medicine

## 2017-05-01 ENCOUNTER — Inpatient Hospital Stay
Admission: EM | Admit: 2017-05-01 | Discharge: 2017-05-04 | DRG: 603 | Disposition: A | Discharge status: Home / Self Care | Payer: Medicare Other | Attending: Internal Medicine | Admitting: Internal Medicine

## 2017-05-01 DIAGNOSIS — M810 Age-related osteoporosis without current pathological fracture: Secondary | ICD-10-CM | POA: Diagnosis not present

## 2017-05-01 DIAGNOSIS — Z85828 Personal history of other malignant neoplasm of skin: Secondary | ICD-10-CM

## 2017-05-01 DIAGNOSIS — R531 Weakness: Secondary | ICD-10-CM | POA: Diagnosis present

## 2017-05-01 DIAGNOSIS — Z91018 Allergy to other foods: Secondary | ICD-10-CM | POA: Diagnosis not present

## 2017-05-01 DIAGNOSIS — L03116 Cellulitis of left lower limb: Secondary | ICD-10-CM | POA: Diagnosis not present

## 2017-05-01 DIAGNOSIS — I872 Venous insufficiency (chronic) (peripheral): Secondary | ICD-10-CM | POA: Diagnosis present

## 2017-05-01 DIAGNOSIS — L0390 Cellulitis, unspecified: Secondary | ICD-10-CM | POA: Diagnosis not present

## 2017-05-01 DIAGNOSIS — E872 Acidosis, unspecified: Secondary | ICD-10-CM

## 2017-05-01 DIAGNOSIS — I739 Peripheral vascular disease, unspecified: Secondary | ICD-10-CM | POA: Diagnosis present

## 2017-05-01 DIAGNOSIS — Z96653 Presence of artificial knee joint, bilateral: Secondary | ICD-10-CM | POA: Diagnosis present

## 2017-05-01 DIAGNOSIS — E538 Deficiency of other specified B group vitamins: Secondary | ICD-10-CM | POA: Diagnosis not present

## 2017-05-01 DIAGNOSIS — M1990 Unspecified osteoarthritis, unspecified site: Secondary | ICD-10-CM | POA: Diagnosis present

## 2017-05-01 DIAGNOSIS — R600 Localized edema: Secondary | ICD-10-CM | POA: Diagnosis not present

## 2017-05-01 DIAGNOSIS — Z8546 Personal history of malignant neoplasm of prostate: Secondary | ICD-10-CM

## 2017-05-01 DIAGNOSIS — Z79899 Other long term (current) drug therapy: Secondary | ICD-10-CM | POA: Diagnosis not present

## 2017-05-01 DIAGNOSIS — M7989 Other specified soft tissue disorders: Secondary | ICD-10-CM | POA: Diagnosis not present

## 2017-05-01 DIAGNOSIS — J301 Allergic rhinitis due to pollen: Secondary | ICD-10-CM | POA: Diagnosis present

## 2017-05-01 DIAGNOSIS — R609 Edema, unspecified: Secondary | ICD-10-CM

## 2017-05-01 DIAGNOSIS — M009 Pyogenic arthritis, unspecified: Secondary | ICD-10-CM

## 2017-05-01 DIAGNOSIS — I700 Atherosclerosis of aorta: Secondary | ICD-10-CM | POA: Diagnosis present

## 2017-05-01 DIAGNOSIS — L02419 Cutaneous abscess of limb, unspecified: Secondary | ICD-10-CM | POA: Diagnosis present

## 2017-05-01 DIAGNOSIS — L03119 Cellulitis of unspecified part of limb: Secondary | ICD-10-CM

## 2017-05-01 DIAGNOSIS — C61 Malignant neoplasm of prostate: Secondary | ICD-10-CM | POA: Diagnosis not present

## 2017-05-01 LAB — CBC WITH DIFFERENTIAL/PLATELET
BASOS ABS: 0.2 10*3/uL — AB (ref 0–0.1)
BASOS PCT: 3 %
EOS ABS: 0.5 10*3/uL (ref 0–0.7)
EOS PCT: 7 %
HEMATOCRIT: 36.3 % — AB (ref 40.0–52.0)
Hemoglobin: 12.3 g/dL — ABNORMAL LOW (ref 13.0–18.0)
Lymphocytes Relative: 29 %
Lymphs Abs: 2 10*3/uL (ref 1.0–3.6)
MCH: 31.1 pg (ref 26.0–34.0)
MCHC: 33.7 g/dL (ref 32.0–36.0)
MCV: 92.2 fL (ref 80.0–100.0)
MONO ABS: 0.7 10*3/uL (ref 0.2–1.0)
Monocytes Relative: 11 %
NEUTROS ABS: 3.4 10*3/uL (ref 1.4–6.5)
Neutrophils Relative %: 50 %
PLATELETS: 161 10*3/uL (ref 150–440)
RBC: 3.94 MIL/uL — ABNORMAL LOW (ref 4.40–5.90)
RDW: 13.7 % (ref 11.5–14.5)
WBC: 6.8 10*3/uL (ref 3.8–10.6)

## 2017-05-01 LAB — COMPREHENSIVE METABOLIC PANEL
ALBUMIN: 4.1 g/dL (ref 3.5–5.0)
ALK PHOS: 58 U/L (ref 38–126)
ALT: 13 U/L — ABNORMAL LOW (ref 17–63)
ANION GAP: 10 (ref 5–15)
AST: 27 U/L (ref 15–41)
BILIRUBIN TOTAL: 0.7 mg/dL (ref 0.3–1.2)
BUN: 20 mg/dL (ref 6–20)
CALCIUM: 8.8 mg/dL — AB (ref 8.9–10.3)
CO2: 20 mmol/L — ABNORMAL LOW (ref 22–32)
Chloride: 103 mmol/L (ref 101–111)
Creatinine, Ser: 1.13 mg/dL (ref 0.61–1.24)
GFR calc Af Amer: >60 mL/min (ref >60)
GFR calc non Af Amer: 54 mL/min — ABNORMAL LOW (ref >60)
GLUCOSE: 126 mg/dL — AB (ref 65–99)
Potassium: 4.3 mmol/L (ref 3.5–5.1)
Sodium: 133 mmol/L — ABNORMAL LOW (ref 135–145)
TOTAL PROTEIN: 7.8 g/dL (ref 6.5–8.1)

## 2017-05-01 LAB — LACTIC ACID, PLASMA
LACTIC ACID, VENOUS: 2.1 mmol/L — AB (ref 0.5–1.9)
Lactic Acid, Venous: 2.1 mmol/L (ref 0.5–1.9)

## 2017-05-01 LAB — MRSA PCR SCREENING: MRSA BY PCR: POSITIVE — AB

## 2017-05-01 MED ORDER — ACETAMINOPHEN 650 MG RE SUPP: 650.0000 mg | Freq: Four times a day (QID) | RECTAL | Status: DC | PRN

## 2017-05-01 MED ORDER — BIOTIN 2.5 MG PO CAPS: 2.5000 mg | ORAL_CAPSULE | Freq: Every day | ORAL | Status: DC

## 2017-05-01 MED ORDER — INFLUENZA VAC SPLIT HIGH-DOSE 0.5 ML IM SUSY
0.5000 mL | PREFILLED_SYRINGE | INTRAMUSCULAR | Status: DC
  Filled 2017-05-01: qty 0.5

## 2017-05-01 MED ORDER — CALCIUM CARBONATE-VITAMIN D 500-200 MG-UNIT PO TABS
2.0000 | ORAL_TABLET | Freq: Every day | ORAL | Status: DC
  Administered 2017-05-02 – 2017-05-04 (×3): 2 via ORAL
  Filled 2017-05-01 (×3): qty 2

## 2017-05-01 MED ORDER — SENNOSIDES-DOCUSATE SODIUM 8.6-50 MG PO TABS
2.0000 | ORAL_TABLET | Freq: Two times a day (BID) | ORAL | Status: DC
  Administered 2017-05-01 – 2017-05-04 (×6): 2 via ORAL
  Filled 2017-05-01 (×6): qty 2

## 2017-05-01 MED ORDER — VANCOMYCIN HCL 10 G IV SOLR
1250.0000 mg | INTRAVENOUS | Status: DC
  Administered 2017-05-01 – 2017-05-04 (×4): 1250 mg via INTRAVENOUS
  Filled 2017-05-01 (×5): qty 1250

## 2017-05-01 MED ORDER — MUPIROCIN 2 % EX OINT
1.0000 "application " | TOPICAL_OINTMENT | Freq: Two times a day (BID) | CUTANEOUS | Status: DC
  Administered 2017-05-01 – 2017-05-04 (×6): 1 via NASAL
  Filled 2017-05-01: qty 22

## 2017-05-01 MED ORDER — LORATADINE 10 MG PO TABS
10.0000 mg | ORAL_TABLET | Freq: Every day | ORAL | Status: DC
  Administered 2017-05-02 – 2017-05-04 (×3): 10 mg via ORAL
  Filled 2017-05-01 (×3): qty 1

## 2017-05-01 MED ORDER — VITAMIN B-12 1000 MCG PO TABS
1000.0000 ug | ORAL_TABLET | Freq: Every day | ORAL | Status: DC
  Administered 2017-05-02 – 2017-05-04 (×3): 1000 ug via ORAL
  Filled 2017-05-01 (×3): qty 1

## 2017-05-01 MED ORDER — CALCIUM-D 600-400 MG-UNIT PO TABS: 2.0000 | ORAL_TABLET | Freq: Every day | ORAL | Status: DC

## 2017-05-01 MED ORDER — CHLORHEXIDINE GLUCONATE CLOTH 2 % EX PADS
6.0000 | MEDICATED_PAD | Freq: Every day | CUTANEOUS | Status: DC
  Administered 2017-05-02 – 2017-05-04 (×3): 6 via TOPICAL

## 2017-05-01 MED ORDER — IBUPROFEN 400 MG PO TABS: 400.0000 mg | ORAL_TABLET | Freq: Three times a day (TID) | ORAL | Status: DC | PRN

## 2017-05-01 MED ORDER — LATANOPROST 0.005 % OP SOLN
1.0000 [drp] | Freq: Every day | OPHTHALMIC | Status: DC
  Administered 2017-05-01 – 2017-05-03 (×3): 1 [drp] via OPHTHALMIC
  Filled 2017-05-01: qty 2.5

## 2017-05-01 MED ORDER — TRAMADOL HCL 50 MG PO TABS: 50.0000 mg | ORAL_TABLET | Freq: Four times a day (QID) | ORAL | Status: DC | PRN

## 2017-05-01 MED ORDER — FERROUS SULFATE 325 (65 FE) MG PO TABS
325.0000 mg | ORAL_TABLET | Freq: Two times a day (BID) | ORAL | Status: DC
  Administered 2017-05-02 – 2017-05-04 (×5): 325 mg via ORAL
  Filled 2017-05-01 (×5): qty 1

## 2017-05-01 MED ORDER — DIPHENHYDRAMINE HCL 25 MG PO CAPS: 25.0000 mg | ORAL_CAPSULE | ORAL | Status: DC | PRN

## 2017-05-01 MED ORDER — CEFAZOLIN SODIUM-DEXTROSE 1-4 GM/50ML-% IV SOLN: 1.0000 g | Freq: Once | INTRAVENOUS | Status: DC

## 2017-05-01 MED ORDER — POLYETHYLENE GLYCOL 3350 17 GM/SCOOP PO POWD
17.0000 g | Freq: Every day | ORAL | Status: DC
  Administered 2017-05-02 – 2017-05-04 (×3): 17 g via ORAL
  Filled 2017-05-01: qty 255

## 2017-05-01 MED ORDER — VANCOMYCIN HCL 10 G IV SOLR
1250.0000 mg | INTRAVENOUS | Status: DC
  Filled 2017-05-01: qty 1250

## 2017-05-01 MED ORDER — ENOXAPARIN SODIUM 40 MG/0.4ML ~~LOC~~ SOLN
40.0000 mg | SUBCUTANEOUS | Status: DC
  Administered 2017-05-01 – 2017-05-03 (×3): 40 mg via SUBCUTANEOUS
  Filled 2017-05-01 (×3): qty 0.4

## 2017-05-01 MED ORDER — VANCOMYCIN HCL IN DEXTROSE 1-5 GM/200ML-% IV SOLN
1000.0000 mg | Freq: Once | INTRAVENOUS | Status: AC
  Administered 2017-05-01: 1000 mg via INTRAVENOUS
  Filled 2017-05-01 (×2): qty 200

## 2017-05-01 MED ORDER — SENNA-DOCUSATE SODIUM 8.6-50 MG PO TABS: 2.0000 | ORAL_TABLET | Freq: Every evening | ORAL | Status: DC

## 2017-05-01 MED ORDER — ACETAMINOPHEN 325 MG PO TABS: 650.0000 mg | ORAL_TABLET | Freq: Four times a day (QID) | ORAL | Status: DC | PRN

## 2017-05-01 MED ORDER — VITAMIN C 500 MG PO TABS
500.0000 mg | ORAL_TABLET | Freq: Every day | ORAL | Status: DC
  Administered 2017-05-02 – 2017-05-04 (×3): 500 mg via ORAL
  Filled 2017-05-01 (×3): qty 1

## 2017-05-01 MED ORDER — ZOLEDRONIC ACID 5 MG/100ML IV SOLN: 5.0000 mg | INTRAVENOUS | Status: DC

## 2017-05-01 MED ORDER — VITAMIN D 1000 UNITS PO TABS
1000.0000 [IU] | ORAL_TABLET | Freq: Two times a day (BID) | ORAL | Status: DC
  Administered 2017-05-01 – 2017-05-04 (×6): 1000 [IU] via ORAL
  Filled 2017-05-01 (×6): qty 1

## 2017-05-01 MED FILL — XTANDI 40 MG CAPSULE: 40 | 30 days supply | Qty: 90 | Fill #0

## 2017-05-01 NOTE — ED Triage Notes (Signed)
Pt to ER via POV c/o left foot cellulitis. Pt had left foot fracture in October and has had ongoing problems since. Sent over to ER for possible IV antibiotics. Pt has artificial left knee and is concerned about infection spreading up leg. Left leg swollen and red up to left knee. Pt has been taking 2 PO antibiotics.   Pt alert and oriented X4, active, cooperative, pt in NAD. RR even and unlabored, color WNL.  Pt walking using walker.

## 2017-05-01 NOTE — Progress Notes (Signed)
Subjective:    Patient ID: Benjamin Villarreal, male    DOB: [birth year], 84 y.o.   MRN: [id number]  HPI  Routine visit for resident in [address] RN reports resident c/o left heel pain. Area is red and noticeable area that is hard and tender to touch. No known injury. Resident also concerned about skin lesion in right temporal region. Not sure how long it has been there. It is tender when he accidentally hits it on something. Otherwise doing about the same. Sleeps okay. Independent with ADL's. Walks with rolling walker. Appetite good. Denies issues with bowel or bladder.  History of Prostate Cancer: s/p surgical intervention. Now on Lupron and Xtandi. Follows with Dr. Janeth Rase.  CVI: He reports swelling in his LLE. He wears TED hose but he reports that doesn't seem to help.  Chronic Constipation: Controlled on Senna and Mirilax.  Osteoporosis: Getting Reclast.  Review of Systems      Past Medical History:  Diagnosis Date  . Aortic atherosclerosis ([place])    Noted on CT  . Arthritis   . Colon polyps    adenomatous  . Dysrhythmia    pt unaware of this  . History of fracture of tibia   . History of tachycardia    patient unaware of this  . Kyphosis of cervicothoracic region    SEVERE  . Osteoporosis 01/2014   T -3.8 (01/2014)  . Other constipation    chronic  . Prostate cancer ([place])    surgery then lupron  . Unspecified venous (peripheral) insufficiency   . Vitamin B12 deficiency     No current facility-administered medications for this visit.    Current Outpatient Medications  Medication Sig Dispense Refill  . aspirin EC 81 MG tablet Take 81 mg by mouth every other day.    . Biotin 2.5 MG CAPS Take 2.5 mg by mouth daily.     . Calcium Carbonate-Vitamin D (CALCIUM-D) 600-400 MG-UNIT TABS Take 1 tablet by mouth daily.     . cephALEXin (KEFLEX) 500 MG capsule Take 1 capsule (500 mg total) by mouth 3 (three) times daily. 30 capsule 0  . cholecalciferol (VITAMIN D) 1000 UNITS  tablet Take 1,000 Units by mouth 2 (two) times daily.     . clindamycin (CLEOCIN) 150 MG capsule Take 500 mg by mouth See admin instructions. Take 4 capsules prior to dental work     . diphenhydrAMINE (BENADRYL) 25 mg capsule Take 25 mg by mouth every 4 (four) hours as needed for itching or allergies.    . fluticasone (FLONASE) 50 MCG/ACT nasal spray Place 1 spray into both nostrils daily.    Marland Kitchen ibuprofen (ADVIL,MOTRIN) 200 MG tablet Take 400 mg by mouth 2 (two) times daily as needed for headache or mild pain.     Marland Kitchen latanoprost (XALATAN) 0.005 % ophthalmic solution Place 1 drop into both eyes at bedtime.    Marland Kitchen leuprolide (LUPRON) 30 MG injection Inject 30 mg into the muscle every 4 (four) months. Dose and interval not clear yet    . loratadine (CLARITIN) 10 MG tablet Take 20 mg by mouth at bedtime.    Marland Kitchen nystatin (NYSTATIN) powder Apply topically 2 (two) times daily as needed. Use 1 application twice daily as needed for rash    . polyethylene glycol powder (GLYCOLAX/MIRALAX) powder FILL TO LINE (17 GRAMS ), MIX AND DRINK TWICE A DAY AND 8.5 GRAMS AT NOON 510 g 3  . sennosides-docusate sodium (SENOKOT-S) 8.6-50 MG tablet Take 2 tablets by  mouth every evening.     . traMADol (ULTRAM) 50 MG tablet Take 1 tablet (50 mg total) by mouth 3 (three) times daily as needed. 270 tablet 0  . triamcinolone cream (KENALOG) 0.1 % Apply 1 application topically every other day. Alternating with Ciclopirox    . vitamin B-12 (CYANOCOBALAMIN) 1000 MCG tablet Take 1,000 mcg by mouth daily.    . vitamin C (ASCORBIC ACID) 500 MG tablet Take 500 mg by mouth daily.    [name] 40 MG capsule TAKE 3 CAPSULES (120 MG TOTAL) BY MOUTH DAILY. 90 capsule 4  . zoledronic acid (RECLAST) 5 MG/100ML SOLN injection Inject 5 mg into the vein every 6 (six) months.      Facility-Administered Medications Ordered in Other Visits  Medication Dose Route Frequency Provider Last Rate Last Dose  . leuprolide (LUPRON) injection 30 mg  30 mg  Intramuscular Once Leia Alf, MD        Allergies  Allergen Reactions  . Other     RAW ONIONS- SINUS REACTION    Family History  Problem Relation Age of Onset  . Diabetes Son   . Hypertension Paternal Grandfather   . Prostate cancer Neg Hx   . Bladder Cancer Neg Hx   . Kidney cancer Neg Hx     Social History   Socioeconomic History  . Marital status: Widowed    Spouse name: [name]  . Number of children: 3  . Years of education: Not on file  . Highest education level: Not on file  Social Needs  . Financial resource strain: Not on file  . Food insecurity - worry: Not on file  . Food insecurity - inability: Not on file  . Transportation needs - medical: Not on file  . Transportation needs - non-medical: Not on file  Occupational History  . Occupation: Research scientist (physical sciences)    Comment: retired  Tobacco Use  . Smoking status: Never Smoker  . Smokeless tobacco: Never Used  Substance and Sexual Activity  . Alcohol use: Yes    Alcohol/week: 0.0 - 0.6 oz    Comment: in past  . Drug use: No  . Sexual activity: Not on file  Other Topics Concern  . Not on file  Social History Narrative   Has living will   Daughter [name] holds health care POA.   Has DNR and we have reviewed this.   Would not want tube feedings if cognitively unaware     Constitutional: Denies fever, malaise, fatigue, headache or abrupt weight changes.  HEENT: Denies eye pain, eye redness, ear pain, ringing in the ears, wax buildup, runny nose, nasal congestion, bloody nose, or sore throat. Respiratory: Denies difficulty breathing, shortness of breath, cough or sputum production.   Cardiovascular: Pt reports swelling of left leg. Denies chest pain, chest tightness, palpitations or swelling in the hands.  Gastrointestinal: Pt reports constipation. Denies abdominal pain, bloating, diarrhea or blood in the stool.  GU: Denies urgency, frequency, pain with urination, burning sensation, blood in  urine, odor or discharge. Musculoskeletal: Pt reports left heel pain. Denies decrease in range of motion, difficulty with gait, muscle pain or joint swelling.  Skin: Pt reports skin lesion of right temporal area. Denies ulcercations.  Neurological: Denies dizziness, difficulty with memory, difficulty with speech or problems with balance and coordination.  Psych: Denies anxiety, depression, SI/HI.  No other specific complaints in a complete review of systems (except as listed in HPI above).  Objective:   Physical Exam  BP 112/72   Pulse 72   Temp 97.7 F (36.5 C)   Resp 20   Wt 183 lb 3.2 oz (83.1 kg)   BMI 23.52 kg/m  Wt Readings from Last 3 Encounters:  05/01/17 185 lb (83.9 kg)  04/20/17 186 lb (84.4 kg)  05/01/17 183 lb 3.2 oz (83.1 kg)    General: Appears his stated age, in NAD. Skin: Lesion concerning for BCC noted on right temple. Cardiovascular: Normal rate and rhythm. S1,S2 noted.  Murmur noted. 1+ pitting LLE edema. Pain with palpation of the left calf. Pulmonary/Chest: Normal effort and positive vesicular breath sounds. No respiratory distress. No wheezes, rales or ronchi noted.  Abdomen: Soft and nontender. Normal bowel sounds.  Musculoskeletal: Bony mass noted with palpation of left heel. Tender to palpation. Gait slow and steady with use of rolling walker. Neurological: Alert and oriented.    BMET    Component Value Date/Time   NA 133 (L) 05/01/2017 1458   NA 140 02/11/2012 0415   K 4.3 05/01/2017 1458   K 3.9 02/11/2012 0415   CL 103 05/01/2017 1458   CL 108 (H) 02/11/2012 0415   CO2 20 (L) 05/01/2017 1458   CO2 25 02/11/2012 0415   GLUCOSE 126 (H) 05/01/2017 1458   GLUCOSE 125 (H) 02/11/2012 0415   BUN 20 05/01/2017 1458   BUN 12 02/11/2012 0415   CREATININE 1.13 05/01/2017 1458   CREATININE 1.00 03/23/2014 1400   CALCIUM 8.8 (L) 05/01/2017 1458   CALCIUM 8.3 (L) 03/23/2014 1400   GFRNONAA 54 (L) 05/01/2017 1458   GFRNONAA >60 03/23/2014 1400    GFRNONAA 49 (L) 09/02/2013 1428   GFRAA >60 05/01/2017 1458   GFRAA >60 03/23/2014 1400   GFRAA 57 (L) 09/02/2013 1428    Lipid Panel  No results found for: CHOL, TRIG, HDL, CHOLHDL, VLDL, LDLCALC  CBC    Component Value Date/Time   WBC 6.8 05/01/2017 1458   RBC 3.94 (L) 05/01/2017 1458   HGB 12.3 (L) 05/01/2017 1458   HGB 11.3 (L) 03/07/2014 1414   HCT 36.3 (L) 05/01/2017 1458   HCT 34.2 (L) 03/07/2014 1414   PLT 161 05/01/2017 1458   PLT 143 (L) 03/07/2014 1414   MCV 92.2 05/01/2017 1458   MCV 84 03/07/2014 1414   MCH 31.1 05/01/2017 1458   MCHC 33.7 05/01/2017 1458   RDW 13.7 05/01/2017 1458   RDW 15.2 (H) 03/07/2014 1414   LYMPHSABS 2.0 05/01/2017 1458   LYMPHSABS 1.8 03/07/2014 1414   MONOABS 0.7 05/01/2017 1458   MONOABS 0.5 03/07/2014 1414   EOSABS 0.5 05/01/2017 1458   EOSABS 0.3 03/07/2014 1414   BASOSABS 0.2 (H) 05/01/2017 1458   BASOSABS 0.2 (H) 03/07/2014 1414    Hgb A1C No results found for: HGBA1C         Assessment & Plan:   Left Heel Pain:  Xray of left heel today He sees podiatry, will have him follow up with them if worse  Skin Lesion of Scalp:  Referral to dermatology for further evaluation and treatment   LLE Edema:  Venous doppler to r/o DVT  Will reassess as needed [name], NP

## 2017-05-01 NOTE — Assessment & Plan Note (Signed)
Continue Reclast

## 2017-05-01 NOTE — Progress Notes (Signed)
Pharmacy Antibiotic Note  Benjamin Villarreal is a 84 y.o. male admitted on 05/01/2017 with cellulitis with concern that infection may be in knee. Patient has history of osteoporosis and left knee surgery. Patient failed Ancef and Clindamycin outpatient. Pharmacy has been consulted for Vancomycin dosing.  Patient received one time dose of vancomycin 1000mg  once in ED.   Plan: Vancomycin 1250 mg Q18H. Will target trough of 15-20 as patient has history of osteoporosis and there is potential for infection to involve knee. Will obtain vancomycin trough prior to 4th dose. Pharmacy will continue to follow and adjust as needed.    Ke= 0.043  T1/2 = 16 hrs Vd = 58 L Cmin = 18.6 Stack dose @ 7 hrs  Height: 6\' 2"  (188 cm) Weight: 185 lb (83.9 kg) IBW/kg (Calculated) : 82.2  Temp (24hrs), Avg:98 F (36.7 C), Min:98 F (36.7 C), Max:98 F (36.7 C)  Recent Labs  Lab 05/01/17 1458  WBC 6.8  CREATININE 1.13  LATICACIDVEN 2.1*    Estimated Creatinine Clearance: 46.5 mL/min (by C-G formula based on SCr of 1.13 mg/dL).    Allergies  Allergen Reactions  . Other     RAW ONIONS- SINUS REACTION    Antimicrobials this admission: Vancomycin 3/8 >>  Dose adjustments this admission:   Microbiology results: MRSA PCR 3/8 >>  Thank you for allowing pharmacy to be a part of this patient's care.  Lendon Ka, PharmD Pharmacy Resident 05/01/2017 5:13 PM

## 2017-05-01 NOTE — ED Notes (Signed)
Pt reports that he has a broken R foot and has cellulitis in right leg.  Pt has been seen by podiatrist and [facility] MD.  Pt has been on 2 different PO antibiotics.  Pt is A&Ox4, in NAD.

## 2017-05-01 NOTE — Assessment & Plan Note (Signed)
Continue Lupron and Xtandi He will continue to follow with Dr. Janeth Rase

## 2017-05-01 NOTE — Assessment & Plan Note (Signed)
Continue Senna and Mirilax °

## 2017-05-01 NOTE — ED Provider Notes (Signed)
[facility] Emergency Department Provider Note  ____________________________________________   First MD Initiated Contact with Patient 05/01/17 1652     (approximate)  I have reviewed the triage vital signs and the nursing notes.   HISTORY  Chief Complaint Cellulitis    HPI Benjamin Villarreal is a 84 y.o. male who comes to the emergency department with painful redness and swelling of his left foot.  He was sent to the emergency department for evaluation for possible IV antibiotics.  He has taken 2 different courses of antibiotics recently orally without improvement in his symptoms.  He denies fevers or chills.  His symptoms have been gradual onset slowly progressive are now moderate severity.  His pain is moderate severity in his left foot radiating upwards towards his knee.  Associated with some swelling.  Pain is worse when ambulating improved with rest.  Past Medical History:  Diagnosis Date  . Aortic atherosclerosis ([place])    Noted on CT  . Arthritis   . Colon polyps    adenomatous  . History of fracture of tibia   . History of tachycardia    patient unaware of this  . Kyphosis of cervicothoracic region    SEVERE  . Osteoporosis 01/2014   T -3.8 (01/2014)  . Other constipation    chronic  . Prostate cancer ([place])    surgery then lupron  . Unspecified venous (peripheral) insufficiency   . Vitamin B12 deficiency     Patient Active Problem List   Diagnosis Date Noted  . Aortic atherosclerosis ([place]) 10/09/2016  . Iron deficiency anemia due to chronic blood loss 10/05/2016  . Peripheral neuropathy 01/24/2016  . 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 Allergic rhinitis due to pollen 10/21/2012  . Prostate cancer ([place]) 03/22/2009  . Chronic venous insufficiency 09/07/2008  . CONSTIPATION, CHRONIC 09/07/2008  . Osteoarthritis, multiple sites 09/05/2008  . Osteoporosis 09/05/2008    Past Surgical History:  Procedure Laterality Date  . BASAL CELL  CARCINOMA EXCISION  2004   Left side of face  . CATARACT EXTRACTION Bilateral 2000, 2003   both eyes  . COLONOSCOPY     h/o adenomatous polyps  . CYSTOSCOPY W/ RETROGRADES Bilateral 11/19/2016   Procedure: CYSTOSCOPY WITH RETROGRADE PYELOGRAM;  Surgeon: Hollice Espy, MD;  Location: [facility] ORS;  Service: Urology;  Laterality: Bilateral;  General vs. Spinal  . CYSTOSCOPY W/ URETERAL STENT PLACEMENT Left 02/25/2017   Procedure: CYSTOSCOPY WITH STENT REPLACEMENT;  Surgeon: Hollice Espy, MD;  Location: [facility] ORS;  Service: Urology;  Laterality: Left;  . CYSTOSCOPY WITH URETEROSCOPY AND STENT PLACEMENT Left 11/19/2016   Procedure: CYSTOSCOPY WITH URETEROSCOPY AND STENT PLACEMENT;  Surgeon: Hollice Espy, MD;  Location: [facility] ORS;  Service: Urology;  Laterality: Left;  . FRACTURE SURGERY    . JOINT REPLACEMENT  2005   Left knee  . JOINT REPLACEMENT  12/13   Right knee  . PROSTATE SURGERY  1996   cancer  . sigmoid resection    . TIBIA FRACTURE SURGERY  1999  . URETERAL BIOPSY Left 11/19/2016   Procedure: URETERAL BIOPSY;  Surgeon: Hollice Espy, MD;  Location: [facility] ORS;  Service: Urology;  Laterality: Left;  Marland Kitchen VOLVULUS REDUCTION  12/10    Prior to Admission medications   Medication Sig Start Date End Date Taking? Authorizing Provider  acetaminophen (TYLENOL) 325 MG tablet Take 325-650 mg by mouth every 4 (four) hours as needed.   Yes [provider]  alum & mag hydroxide-simeth (MAALOX/MYLANTA) 200-200-20 MG/5ML suspension  Take 30 mLs by mouth every 4 (four) hours as needed for indigestion or heartburn.   Yes [provider]  aspirin EC 81 MG tablet Take 81 mg by mouth every other day.   Yes [provider]  Biotin 2.5 MG CAPS Take 2.5 mg by mouth daily.    Yes [provider]  bismuth subsalicylate (KAOPECTATE) 262 MG/15ML suspension Take 10 mLs by mouth 3 (three) times daily as needed.   Yes [provider]  Calcium Carbonate-Vitamin D  (CALCIUM-D) 600-400 MG-UNIT TABS Take 1 tablet by mouth daily.    Yes [provider]  carbamide peroxide (DEBROX) 6.5 % OTIC solution 5 drops daily as needed.   Yes [provider]  cholecalciferol (VITAMIN D) 1000 UNITS tablet Take 1,000 Units by mouth 2 (two) times daily.    Yes [provider]  clindamycin (CLEOCIN) 150 MG capsule Take 500 mg by mouth See admin instructions. Take 4 capsules prior to dental work  03/27/16  Yes [provider]  cyclobenzaprine (FLEXERIL) 5 MG tablet Take 5 mg by mouth at bedtime as needed for muscle spasms.   Yes [provider]  diphenhydrAMINE (BENADRYL) 25 mg capsule Take 25 mg by mouth every 4 (four) hours as needed for itching or allergies.   Yes [provider]  fluticasone (FLONASE) 50 MCG/ACT nasal spray Place 1 spray into both nostrils daily.   Yes [provider]  ibuprofen (ADVIL,MOTRIN) 200 MG tablet Take 400 mg by mouth 2 (two) times daily as needed for headache or mild pain.    Yes [provider]  latanoprost (XALATAN) 0.005 % ophthalmic solution Place 1 drop into both eyes at bedtime.   Yes [provider]  leuprolide (LUPRON) 30 MG injection Inject 30 mg into the muscle every 4 (four) months. Dose and interval not clear yet   Yes [provider]  loratadine (CLARITIN) 10 MG tablet Take 20 mg by mouth at bedtime.   Yes [provider]  magnesium hydroxide (MILK OF MAGNESIA) 400 MG/5ML suspension Take 30 mLs by mouth daily as needed for mild constipation.   Yes [provider]  nystatin (NYSTATIN) powder Apply topically 2 (two) times daily as needed. Use 1 application twice daily as needed for rash   Yes [provider]  polyethylene glycol powder (GLYCOLAX/MIRALAX) powder FILL TO LINE (17 GRAMS ), MIX AND DRINK TWICE A DAY AND 8.5 GRAMS AT NOON 09/30/16  Yes Venia Carbon, MD  sennosides-docusate sodium (SENOKOT-S) 8.6-50 MG tablet Take  4 tablets by mouth every evening.    Yes [provider]  sodium chloride (OCEAN) 0.65 % SOLN nasal spray Place 1-2 sprays into both nostrils 2 (two) times daily as needed for congestion.   Yes [provider]  traMADol (ULTRAM) 50 MG tablet Take by mouth 4 (four) times daily as needed.   Yes [provider]  vitamin B-12 (CYANOCOBALAMIN) 1000 MCG tablet Take 1,000 mcg by mouth daily.   Yes [provider]  vitamin C (ASCORBIC ACID) 500 MG tablet Take 500 mg by mouth daily.   Yes [provider]  XTANDI 40 MG capsule TAKE 3 CAPSULES (120 MG TOTAL) BY MOUTH DAILY. 04/30/17  Yes Cammie Sickle, MD  zoledronic acid (RECLAST) 5 MG/100ML SOLN injection Inject 5 mg into the vein every 6 (six) months.    Yes [provider]  cephALEXin (KEFLEX) 500 MG capsule Take 1 capsule (500 mg total) by mouth 3 (three) times  daily. Patient not taking: Reported on 05/01/2017 04/20/17   Jacquelin Hawking, NP  traMADol (ULTRAM) 50 MG tablet Take 1 tablet (50 mg total) by mouth 3 (three) times daily as needed. Patient not taking: Reported on 05/01/2017 04/09/16   Viviana Simpler I, MD  triamcinolone cream (KENALOG) 0.1 % Apply 1 application topically daily as needed.     [provider]    Allergies Other  Family History  Problem Relation Age of Onset  . Leukemia Mother   . Diabetes Son   . Prostate cancer Neg Hx   . Bladder Cancer Neg Hx   . Kidney cancer Neg Hx     Social History Social History   Tobacco Use  . Smoking status: Never Smoker  . Smokeless tobacco: Never Used  Substance Use Topics  . Alcohol use: No    Alcohol/week: 0.0 - 0.6 oz    Frequency: Never    Comment: in past  . Drug use: No    Review of Systems Constitutional: No fever/chills Eyes: No visual changes. ENT: No sore throat. Cardiovascular: Denies chest pain. Respiratory: Denies shortness of breath. Gastrointestinal: No abdominal pain.  No nausea, no vomiting.   No diarrhea.  No constipation. Genitourinary: Negative for dysuria. Musculoskeletal: Negative for back pain. Skin: Positive for rash Neurological: Negative for headaches, focal weakness or numbness.   ____________________________________________   PHYSICAL EXAM:  VITAL SIGNS: ED Triage Vitals [05/01/17 1450]  Enc Vitals Group     BP (!) 147/79     Pulse Rate 66     Resp 18     Temp 98 F (36.7 C)     Temp Source Oral     SpO2 100 %     Weight 185 lb (83.9 kg)     Height 6\' 2"  (1.88 m)     Head Circumference      Peak Flow      Pain Score 0     Pain Loc      Pain Edu?      Excl. in [place]?     Constitutional: Pleasant cooperative speaks in full clear sentences no diaphoresis Eyes: PERRL EOMI. Head: Atraumatic. Nose: No congestion/rhinnorhea. Mouth/Throat: No trismus Neck: No stridor.   Cardiovascular: Normal rate, regular rhythm. Grossly normal heart sounds.  Good peripheral circulation. Respiratory: Normal respiratory effort.  No retractions. Lungs CTAB and moving good air Gastrointestinal: Soft nontender Musculoskeletal: 1+ edema to left lower extremity Neurologic:  Normal speech and language. No gross focal neurologic deficits are appreciated. Skin: Left lower extremity with erythema warmth and tenderness although no bulla blister sloughing or other signs of necrotizing soft tissue infection Psychiatric: Mood and affect are normal. Speech and behavior are normal.    ____________________________________________   DIFFERENTIAL includes but not limited to  Cellulitis, lymphadenitis, deep vein thrombosis, necrotizing soft tissue infection ____________________________________________   LABS (all labs ordered are listed, but only abnormal results are displayed)  Labs Reviewed  MRSA PCR SCREENING - Abnormal; Notable for the following components:      Result Value   MRSA by PCR POSITIVE (*)    All other components within normal limits  LACTIC ACID, PLASMA -  Abnormal; Notable for the following components:   Lactic Acid, Venous 2.1 (*)    All other components within normal limits  LACTIC ACID, PLASMA - Abnormal; Notable for the following components:   Lactic Acid, Venous 2.1 (*)    All other components within normal limits  COMPREHENSIVE METABOLIC PANEL - Abnormal;  Notable for the following components:   Sodium 133 (*)    CO2 20 (*)    Glucose, Bld 126 (*)    Calcium 8.8 (*)    ALT 13 (*)    GFR calc non Af Amer 54 (*)    All other components within normal limits  CBC WITH DIFFERENTIAL/PLATELET - Abnormal; Notable for the following components:   RBC 3.94 (*)    Hemoglobin 12.3 (*)    HCT 36.3 (*)    Basophils Absolute 0.2 (*)    All other components within normal limits  COMPREHENSIVE METABOLIC PANEL - Abnormal; Notable for the following components:   CO2 20 (*)    Glucose, Bld 114 (*)    Calcium 8.5 (*)    ALT 12 (*)    GFR calc non Af Amer 57 (*)    All other components within normal limits  CBC - Abnormal; Notable for the following components:   RBC 3.78 (*)    Hemoglobin 11.7 (*)    HCT 35.0 (*)    Platelets 141 (*)    All other components within normal limits  LACTIC ACID, PLASMA  LACTIC ACID, PLASMA  VANCOMYCIN, TROUGH  CREATININE, SERUM    Lab work reviewed by me shows elevated lactic acid concerning for under resuscitation and infection __________________________________________  EKG   ____________________________________________  RADIOLOGY   ____________________________________________   PROCEDURES  Procedure(s) performed: no  Procedures  Critical Care performed: no  Observation: no ____________________________________________   INITIAL IMPRESSION / ASSESSMENT AND PLAN / ED COURSE  Pertinent labs & imaging results that were available during my care of the patient were reviewed by me and considered in my medical decision making (see chart for details).  Patient arrives with clinical cellulitis  to his left lower extremity.  He has failed to outpatient oral antibiotics I do believe at this time he requires inpatient admission for IV antibiotics and possible infectious disease consultation.  He is not septic.  I discussed with the patient and family who verbalized understanding and agreeable with the plan.  I then discussed with the hospitalist who has graciously agreed to admit the patient to his service.      ____________________________________________   FINAL CLINICAL IMPRESSION(S) / ED DIAGNOSES  Final diagnoses:  Lactic acidosis  Cellulitis of left lower extremity      NEW MEDICATIONS STARTED DURING THIS VISIT:  Current Discharge Medication List       Note:  This document was prepared using Dragon voice recognition software and may include unintentional dictation errors.     Darel Hong, MD 05/03/17 [phone]

## 2017-05-01 NOTE — Progress Notes (Signed)
Family Meeting Note  Advance Directive:yes  Today a meeting took place with the Patient, daughter     The following clinical team members were present during this meeting:MD  The following were discussed:Patient's diagnosis: Left lower extremity cellulitis failed outpatient antibiotics, chronic osteoporosis, prostate cancer, vitamin B12 deficiency, management and plan of care discussed in detail with the patient and his daughter at bedside, Patient's progosis: Unable to determine and Goals for treatment: Full Code,daughter Benjamin Villarreal is HCPOA  Additional follow-up to be provided: Hospitalist   Time spent during discussion:17 ,MIN  Nicholes Mango, MD

## 2017-05-01 NOTE — Patient Instructions (Signed)
Basal Cell Carcinoma Basal cell carcinoma is the most common form of skin cancer. It begins in the basal cells, which are at the bottom of the outer skin layer (epidermis). It occurs most often on parts of the body that are frequently exposed to the sun, such as:  Parts of the head, including the scalp or face.  Ears.  Neck.  Arms or legs.  Backs of the hands.  Basal cell carcinoma can almost always be cured. It rarely spreads to other areas of the body (metastasizes). Basal cell carcinoma may come back at the same location (recur), but it can be treated again if this occurs. What are the causes? This condition is usually caused by exposure to ultraviolet (UV) light. UV light may come from the sun or from tanning beds. Other causes include:  Exposure to arsenic.  Exposure to radiation.  Exposure to toxic tars and oils.  Certain genetic conditions, such as xeroderma pigmentosum.  What increases the risk? This condition is more likely to develop in:  People who are older than 82 years of age.  People who have fair skin (light complexion).  People who have blonde or red hair.  People who have blue, green, or gray eyes.  People who have childhood freckling.  People who have had sun exposure over long periods of time, especially during childhood.  People who have had repeated sunburns.  People who have a weakened immune system.  People who have been exposed to certain chemicals, such as tar, soot, and arsenic.  People who have chronic inflammatory conditions.  People who have chronic infections.  People who use tanning beds.  What are the signs or symptoms? The main symptom of this condition is a growth or lesion on the skin. The shape and color of the growth or lesion may vary. The five main types include:  An open sore that may remain open for 3 weeks or longer. The sore may bleed or crust. This type of lesion can be an early sign of basal cell carcinoma. Basal  cell carcinoma often shows up as a sore that does not heal.  A reddish area that may crust, itch, or cause discomfort. This may occur on areas that are exposed to the sun. These patches might be easier to feel than to see.  A shiny or clear bump that is red, white, or pink. In people who have dark hair, the bump is often tan, black, or brown. These bumps can look like moles.  A pink growth with a raised border. The growth will have a crusted and indented area in the center. Small blood vessels may appear on the surface of the growth as it gets bigger.  A scarlike area that looks like shiny, stretched skin. The area may be white, yellow, or waxy. It often has irregular borders. This may be a sign of more aggressive basal cell carcinoma.  How is this diagnosed? This condition may be diagnosed with:  A physical exam.  Removal of a tissue sample to be examined under a microscope (biopsy).  How is this treated? Treatment for this condition involves removing the cancerous tissue. The method that is used for this depends on the type, size, location, and number of tumors. Possible treatments include:  Mohs surgery. In this procedure, the cancerous skin cells are removed layer by layer until all of the tumor has been removed.  Surgical removal (excision) of the tumor. This involves removing the entire tumor and a small amount of   normal skin that surrounds it.  Cryosurgery. This involves freezing the tumor with liquid nitrogen.  Plastic surgery. The tumor is removed, and healthy skin from another part of the body is used to cover the wound. This may be done for large tumors that are in areas where it is not possible to stretch the nearby skin to sew the edges of the wound together.  Radiation. This may be used for tumors on the face.  Photodynamic therapy. A chemical cream is applied to the skin, and light exposure is used to activate the chemical.  Electrodesiccation and curettage. This  involves alternately scraping and burning the tumor while using an electric current to control bleeding.  Chemical treatments, such as imiquimod cream and interferon injections. These may be used to remove superficial tumors with minimal scarring.  Follow these instructions at home:  Avoid unprotected sun exposure.  Do self-exams as told by your health care provider. Look for new spots or changes in your skin.  Keep all follow-up visits as told by your health care provider. This is important. How is this prevented?  Avoid the sun when it is the strongest. This is usually between 10:00 a.m. and 4:00 p.m.  When you are out in the sun, use a sunscreen that has a sun protection factor (SPF) of at least 30.  Apply sunscreen at least 30 minutes before exposure to the sun.  Reapply sunscreen every 2-4 hours while you are outside. Also reapply it after swimming and after excessive sweating.  Always wear hats, protective clothing, and UV-blocking sunglasses when you are outdoors.  Do not use tanning beds. Contact a health care provider if:  You notice any new spots or any changes in your skin.  You have had a basal cell carcinoma tumor removed and you notice a new growth in the same location. This information is not intended to replace advice given to you by your health care provider. Make sure you discuss any questions you have with your health care provider. Document Released: 08/17/2002 Document Revised: 07/19/2015 Document Reviewed: 06/05/2014 Elsevier Interactive Patient Education  2018 [employer]  

## 2017-05-01 NOTE — Progress Notes (Signed)
Patient ordered zoledronic acid 5mg  as a continuation of outpatient regimen. Zoledronic acid is restricted to outpatient use and is not part of the inpatient formulary. Order has been discontinued per policy.   Currie Paris

## 2017-05-01 NOTE — Assessment & Plan Note (Signed)
Continue TED's Encouraged elevation

## 2017-05-01 NOTE — H&P (Signed)
[facility] [place] NAME: Benjamin Villarreal    MR#:  [id number]  DATE OF BIRTH:  02-Dec-1920  DATE OF ADMISSION:  05/01/2017  PRIMARY CARE PHYSICIAN: Benjamin Carbon, MD   REQUESTING/REFERRING PHYSICIAN: Rifenbark  CHIEF COMPLAINT:   Left leg redness HISTORY OF PRESENT ILLNESS:  Benjamin Villarreal  is a 84 y.o. male with a known history of osteoporosis, prostate cancer, B12 deficiency, left leg cellulitis , [facility] ,failed outpatient antibiotics Ancef and clindamycin is presenting to the ED with worsening of the left leg cellulitis.  Patient had left knee surgery and concerned that this infection might be extending into his knee joint daughter at bedside denies any fever lactic acid is elevated at 2.1 but no leukocytosis  PAST MEDICAL HISTORY:   Past Medical History:  Diagnosis Date  . Aortic atherosclerosis ([place])    Noted on CT  . Arthritis   . Colon polyps    adenomatous  . Dysrhythmia    pt unaware of this  . History of fracture of tibia   . History of tachycardia    patient unaware of this  . Kyphosis of cervicothoracic region    SEVERE  . Osteoporosis 01/2014   T -3.8 (01/2014)  . Other constipation    chronic  . Prostate cancer ([place])    surgery then lupron  . Unspecified venous (peripheral) insufficiency   . Vitamin B12 deficiency     PAST SURGICAL HISTOIRY:   Past Surgical History:  Procedure Laterality Date  . BASAL CELL CARCINOMA EXCISION  2004   Left side of face  . CATARACT EXTRACTION Bilateral 2000, 2003   both eyes  . COLONOSCOPY     h/o adenomatous polyps  . CYSTOSCOPY W/ RETROGRADES Bilateral 11/19/2016   Procedure: CYSTOSCOPY WITH RETROGRADE PYELOGRAM;  Surgeon: Benjamin Espy, MD;  Location: [facility];  Service: Urology;  Laterality: Bilateral;  General vs. Spinal  . CYSTOSCOPY W/ URETERAL STENT PLACEMENT Left 02/25/2017   Procedure: CYSTOSCOPY WITH STENT REPLACEMENT;  Surgeon: Benjamin Espy,  MD;  Location: [facility];  Service: Urology;  Laterality: Left;  . CYSTOSCOPY WITH URETEROSCOPY AND STENT PLACEMENT Left 11/19/2016   Procedure: CYSTOSCOPY WITH URETEROSCOPY AND STENT PLACEMENT;  Surgeon: Benjamin Espy, MD;  Location: [facility];  Service: Urology;  Laterality: Left;  . FRACTURE SURGERY    . JOINT REPLACEMENT  2005   Left knee  . JOINT REPLACEMENT  12/13   Right knee  . PROSTATE SURGERY  1996   cancer  . sigmoid resection    . TIBIA FRACTURE SURGERY  1999  . URETERAL BIOPSY Left 11/19/2016   Procedure: URETERAL BIOPSY;  Surgeon: Benjamin Espy, MD;  Location: [facility];  Service: Urology;  Laterality: Left;  Marland Kitchen VOLVULUS REDUCTION  12/10    SOCIAL HISTORY:   Social History   Tobacco Use  . Smoking status: Never Smoker  . Smokeless tobacco: Never Used  Substance Use Topics  . Alcohol use: Yes    Alcohol/week: 0.0 - 0.6 oz    Comment: in past    FAMILY HISTORY:   Family History  Problem Relation Age of Onset  . Diabetes Son   . Hypertension Paternal Grandfather   . Prostate cancer Neg Hx   . Bladder Cancer Neg Hx   . Kidney cancer Neg Hx     DRUG ALLERGIES:   Allergies  Allergen Reactions  . Other     RAW ONIONS- SINUS REACTION  REVIEW OF SYSTEMS:  CONSTITUTIONAL: No fever, fatigue or weakness.  EYES: No blurred or double vision.  EARS, NOSE, AND THROAT: No tinnitus or ear pain.  RESPIRATORY: No cough, shortness of breath, wheezing or hemoptysis.  CARDIOVASCULAR: No chest pain, orthopnea, edema.  GASTROINTESTINAL: No nausea, vomiting, diarrhea or abdominal pain.  GENITOURINARY: No dysuria, hematuria.  ENDOCRINE: No polyuria, nocturia,  HEMATOLOGY: No anemia, easy bruising or bleeding SKIN: No rash or lesion. MUSCULOSKELETAL: Left leg redness and swelling  nEUROLOGIC: No tingling, numbness, weakness.  PSYCHIATRY: No anxiety or depression.   MEDICATIONS AT HOME:   Prior to Admission medications   Medication Sig Start Date End Date Taking?  Authorizing Provider  XTANDI 40 MG capsule TAKE 3 CAPSULES (120 MG TOTAL) BY MOUTH DAILY. 04/30/17  Yes Cammie Sickle, MD  Biotin 2.5 MG CAPS Take 2.5 mg by mouth daily.     [provider]  Calcium Carbonate-Vitamin D (CALCIUM-D) 600-400 MG-UNIT TABS Take 2 tablets by mouth daily.     [provider]  cephALEXin (KEFLEX) 500 MG capsule Take 1 capsule (500 mg total) by mouth 3 (three) times daily. 04/20/17   Jacquelin Hawking, NP  cholecalciferol (VITAMIN D) 1000 UNITS tablet Take 1,000 Units by mouth 2 (two) times daily.     [provider]  clindamycin (CLEOCIN) 150 MG capsule Take 500 mg by mouth See admin instructions. Take 4 capsules prior to dental work  03/27/16   [provider]  diphenhydrAMINE (BENADRYL) 25 mg capsule Take 25 mg by mouth every 4 (four) hours as needed for itching or allergies.    [provider]  ferrous sulfate 325 (65 FE) MG tablet Take 325 mg by mouth 2 (two) times daily with a meal.    [provider]  ibuprofen (ADVIL,MOTRIN) 200 MG tablet Take 400 mg by mouth every 8 (eight) hours as needed for headache or mild pain.    [provider]  latanoprost (XALATAN) 0.005 % ophthalmic solution Place 1 drop into both eyes at bedtime.    [provider]  leuprolide (LUPRON) 30 MG injection Inject 30 mg into the muscle every 4 (four) months. Dose and interval not clear yet    [provider]  loratadine (CLARITIN) 10 MG tablet TAKE 1 TABLET DAILY 02/04/17   Viviana Simpler I, MD  nystatin (NYSTATIN) powder Apply topically 2 (two) times daily.     [provider]  polyethylene glycol powder (GLYCOLAX/MIRALAX) powder FILL TO LINE (17 GRAMS ), MIX AND DRINK TWICE A DAY AND 8.5 GRAMS AT NOON 09/30/16   Viviana Simpler I, MD  sennosides-docusate sodium (SENOKOT-S) 8.6-50 MG tablet Take 2 tablets by mouth every evening.     [provider]  traMADol (ULTRAM) 50 MG tablet Take 1 tablet  (50 mg total) by mouth 3 (three) times daily as needed. 04/09/16   Benjamin Carbon, MD  triamcinolone cream (KENALOG) 0.1 % Apply 1 application topically every other day. Alternating with Ciclopirox    [provider]  vitamin B-12 (CYANOCOBALAMIN) 1000 MCG tablet Take 1,000 mcg by mouth daily.    [provider]  vitamin C (ASCORBIC ACID) 500 MG tablet Take 500 mg by mouth daily.    [provider]  zoledronic acid (RECLAST) 5 MG/100ML SOLN injection Inject 5 mg into the vein every 6 (six) months.     [provider]      VITAL SIGNS:  Blood pressure (!) 164/94, pulse 63, temperature 98 F (36.7  C), temperature source Oral, resp. rate 18, height 6\' 2"  (1.88 m), weight 83.9 kg (185 lb), SpO2 100 %.  PHYSICAL EXAMINATION:  GENERAL:  84 y.o.-year-old patient lying in the bed with no acute distress.  EYES: Pupils equal, round, reactive to light and accommodation. No scleral icterus. Extraocular muscles intact.  HEENT: Head atraumatic, normocephalic. Oropharynx and nasopharynx clear.  NECK:  Supple, no jugular venous distention. No thyroid enlargement, no tenderness.  LUNGS: Normal breath sounds bilaterally, no wheezing, rales,rhonchi or crepitation. No use of accessory muscles of respiration.  CARDIOVASCULAR: S1, S2 normal. No murmurs, rubs, or gallops.  ABDOMEN: Soft, nontender, nondistended. Bowel sounds present. No organomegaly or mass.  EXTREMITIES: Left lower extremity erythema, edema, tenderness extending up to the left knee joint  no pedal edema, cyanosis, or clubbing.  NEUROLOGIC: Cranial nerves II through XII are intact. Sensation intact. Gait not checked.  PSYCHIATRIC: The patient is alert and oriented x 3.  SKIN: No obvious rash, lesion, or ulcer.   LABORATORY PANEL:   CBC Recent Labs  Lab 05/01/17 1458  WBC 6.8  HGB 12.3*  HCT 36.3*  PLT 161    ------------------------------------------------------------------------------------------------------------------  Chemistries  Recent Labs  Lab 05/01/17 1458  NA 133*  K 4.3  CL 103  CO2 20*  GLUCOSE 126*  BUN 20  CREATININE 1.13  CALCIUM 8.8*  AST 27  ALT 13*  ALKPHOS 58  BILITOT 0.7   ------------------------------------------------------------------------------------------------------------------  Cardiac Enzymes No results for input(s): TROPONINI in the last 168 hours. ------------------------------------------------------------------------------------------------------------------  RADIOLOGY:  No results found.  EKG:   Orders placed or performed during the hospital encounter of 11/11/16  . EKG 12-Lead  . EKG 12-Lead    IMPRESSION AND PLAN:   Irvine Glorioso  is a 84 y.o. male with a known history of osteoporosis, prostate cancer, B12 deficiency, left leg cellulitis , [facility] ,failed outpatient antibiotics Ancef and clindamycin is presenting to the ED with worsening of the left leg cellulitis.  Patient had left knee surgery and concerned that this infection might be extending into his knee joint  #Acute left lower extremity cellulitis failed outpatient antibiotics, Admit to MedSurg unit IV vancomycin We will get x-ray of the knee to rule out septic joint We will get left lower extremity venous Dopplers to rule out DVT Pain management as needed  #Vitamin B12 deficiency check B12 levels and replete as needed   #Chronic history of osteoporosis Resume home medication  #Chronic history of prostate cancer follow-up with the urologist as recommended     All the records are reviewed and case discussed with ED provider. Management plans discussed with the patient, family and they are in agreement.  CODE STATUS: FC ,DAUGHTER IS HCPOA  TOTAL TIME TAKING CARE OF THIS PATIENT: 42  minutes.   Note: This dictation was prepared with Dragon dictation  along with smaller phrase technology. Any transcriptional errors that result from this process are unintentional.  Nicholes Mango M.D on 05/01/2017 at 5:46 PM  Between 7am to 6pm - Pager - [phone]  After 6pm go to [email or web address] - password [email or web address] [facility]  Office  [phone]  CC: Primary care physician; Benjamin Carbon, MD

## 2017-05-02 ENCOUNTER — Inpatient Hospital Stay: Payer: Medicare Other

## 2017-05-02 LAB — COMPREHENSIVE METABOLIC PANEL
ALBUMIN: 3.6 g/dL (ref 3.5–5.0)
ALT: 12 U/L — ABNORMAL LOW (ref 17–63)
ANION GAP: 8 (ref 5–15)
AST: 23 U/L (ref 15–41)
Alkaline Phosphatase: 49 U/L (ref 38–126)
BUN: 20 mg/dL (ref 6–20)
CHLORIDE: 110 mmol/L (ref 101–111)
CO2: 20 mmol/L — AB (ref 22–32)
Calcium: 8.5 mg/dL — ABNORMAL LOW (ref 8.9–10.3)
Creatinine, Ser: 1.08 mg/dL (ref 0.61–1.24)
GFR calc Af Amer: >60 mL/min (ref >60)
GFR calc non Af Amer: 57 mL/min — ABNORMAL LOW (ref >60)
GLUCOSE: 114 mg/dL — AB (ref 65–99)
POTASSIUM: 4.2 mmol/L (ref 3.5–5.1)
SODIUM: 138 mmol/L (ref 135–145)
Total Bilirubin: 0.7 mg/dL (ref 0.3–1.2)
Total Protein: 6.7 g/dL (ref 6.5–8.1)

## 2017-05-02 LAB — CBC
HEMATOCRIT: 35 % — AB (ref 40.0–52.0)
Hemoglobin: 11.7 g/dL — ABNORMAL LOW (ref 13.0–18.0)
MCH: 30.9 pg (ref 26.0–34.0)
MCHC: 33.4 g/dL (ref 32.0–36.0)
MCV: 92.5 fL (ref 80.0–100.0)
Platelets: 141 10*3/uL — ABNORMAL LOW (ref 150–440)
RBC: 3.78 MIL/uL — ABNORMAL LOW (ref 4.40–5.90)
RDW: 13.9 % (ref 11.5–14.5)
WBC: 5.5 10*3/uL (ref 3.8–10.6)

## 2017-05-02 LAB — LACTIC ACID, PLASMA
LACTIC ACID, VENOUS: 1.5 mmol/L (ref 0.5–1.9)
Lactic Acid, Venous: 1.7 mmol/L (ref 0.5–1.9)

## 2017-05-02 NOTE — Plan of Care (Signed)
  Activity: Risk for activity intolerance will decrease 05/02/2017 1545 - Progressing by Oris Drone, RN  Pt oob; stands at bedside to use the urinal x 1 min. assist Safety: Ability to remain free from injury will improve 05/02/2017 1545 - Progressing by Oris Drone, RN  Pt free from injury to date this shift; bed alarm remains on the bed Skin Integrity: Skin integrity will improve 05/02/2017 1545 - Progressing by Oris Drone, RN  LLE red and warm to the touch; with slight swelling; pt verbalized the swelling has gone down a lot

## 2017-05-02 NOTE — Progress Notes (Signed)
[facility] [place] NAME: Benjamin Villarreal    MR#:  [id number]  DATE OF BIRTH:  11/16/1920  SUBJECTIVE:  CHIEF COMPLAINT: Patient's leg redness and swelling are improving  REVIEW OF SYSTEMS:  CONSTITUTIONAL: No fever, fatigue or weakness.  EYES: No blurred or double vision.  EARS, NOSE, AND THROAT: No tinnitus or ear pain.  RESPIRATORY: No cough, shortness of breath, wheezing or hemoptysis.  CARDIOVASCULAR: No chest pain, orthopnea, edema.  GASTROINTESTINAL: No nausea, vomiting, diarrhea or abdominal pain.  GENITOURINARY: No dysuria, hematuria.  ENDOCRINE: No polyuria, nocturia,  HEMATOLOGY: No anemia, easy bruising or bleeding SKIN: No rash or lesion. MUSCULOSKELETAL: Left leg redness and  swelling are improving NEUROLOGIC: No tingling, numbness, weakness.  PSYCHIATRY: No anxiety or depression.   DRUG ALLERGIES:   Allergies  Allergen Reactions  . Other     RAW ONIONS- SINUS REACTION    VITALS:  Blood pressure (!) 136/58, pulse (!) 53, temperature 98.3 F (36.8 C), temperature source Oral, resp. rate 20, height 6\' 2"  (1.88 m), weight 83.9 kg (185 lb), SpO2 98 %.  PHYSICAL EXAMINATION:  GENERAL:  84 y.o.-year-old patient lying in the bed with no acute distress.  EYES: Pupils equal, round, reactive to light and accommodation. No scleral icterus. Extraocular muscles intact.  HEENT: Head atraumatic, normocephalic. Oropharynx and nasopharynx clear.  NECK:  Supple, no jugular venous distention. No thyroid enlargement, no tenderness.  LUNGS: Normal breath sounds bilaterally, no wheezing, rales,rhonchi or crepitation. No use of accessory muscles of respiration.  CARDIOVASCULAR: S1, S2 normal. No murmurs, rubs, or gallops.  ABDOMEN: Soft, nontender, nondistended. Bowel sounds present. No organomegaly or mass.  EXTREMITIES: Left leg with less erythema and tenderness no pedal edema, cyanosis, or clubbing.  NEUROLOGIC: Cranial nerves II  through XII are intact. Muscle strength 5/5 in all extremities. Sensation intact. Gait not checked.  PSYCHIATRIC: The patient is alert and oriented x 3.  SKIN: No obvious rash, lesion, or ulcer.    LABORATORY PANEL:   CBC Recent Labs  Lab 05/02/17 0524  WBC 5.5  HGB 11.7*  HCT 35.0*  PLT 141*   ------------------------------------------------------------------------------------------------------------------  Chemistries  Recent Labs  Lab 05/02/17 0524  NA 138  K 4.2  CL 110  CO2 20*  GLUCOSE 114*  BUN 20  CREATININE 1.08  CALCIUM 8.5*  AST 23  ALT 12*  ALKPHOS 49  BILITOT 0.7   ------------------------------------------------------------------------------------------------------------------  Cardiac Enzymes No results for input(s): TROPONINI in the last 168 hours. ------------------------------------------------------------------------------------------------------------------  RADIOLOGY:  US Venous Img Lower Unilateral Left  Result Date: 05/02/2017 CLINICAL DATA:  Left lower extremity pain and edema. History of malignancy. Evaluate for DVT. EXAM: LEFT LOWER EXTREMITY VENOUS DOPPLER ULTRASOUND TECHNIQUE: Gray-scale sonography with graded compression, as well as color Doppler and duplex ultrasound were performed to evaluate the lower extremity deep venous systems from the level of the common femoral vein and including the common femoral, femoral, profunda femoral, popliteal and calf veins including the posterior tibial, peroneal and gastrocnemius veins when visible. The superficial great saphenous vein was also interrogated. Spectral Doppler was utilized to evaluate flow at rest and with distal augmentation maneuvers in the common femoral, femoral and popliteal veins. COMPARISON:  None. FINDINGS: Contralateral Common Femoral Vein: Respiratory phasicity is normal and symmetric with the symptomatic side. No evidence of thrombus. Normal compressibility. Common Femoral Vein: No  evidence of thrombus. Normal compressibility, respiratory phasicity and response to augmentation. Saphenofemoral Junction: No evidence of thrombus. Normal compressibility  and flow on color Doppler imaging. Profunda Femoral Vein: No evidence of thrombus. Normal compressibility and flow on color Doppler imaging. Femoral Vein: No evidence of thrombus. Normal compressibility, respiratory phasicity and response to augmentation. Popliteal Vein: No evidence of thrombus. Normal compressibility, respiratory phasicity and response to augmentation. Calf Veins: No evidence of thrombus. Normal compressibility and flow on color Doppler imaging. Superficial Great Saphenous Vein: No evidence of thrombus. Normal compressibility. Venous Reflux:  None. Other Findings:  None. IMPRESSION: No evidence of DVT within the left lower extremity. Electronically Signed   By: Sandi Mariscal M.D.   On: 05/02/2017 09:10   Dg Knee Complete 4 Views Left  Result Date: 05/01/2017 CLINICAL DATA:  Erythema and swelling of the left lower leg for months. EXAM: LEFT KNEE - COMPLETE 4+ VIEW COMPARISON:  None. FINDINGS: Left total knee arthroplasty with two cannulated screws deep to the lateral aspect of the tibial tray. No joint effusion. There is nonspecific mild generalized soft tissue swelling about the left knee. No hardware loosening, fracture nor suspicious osseous lesions. IMPRESSION: Generalized soft tissue swelling about the left knee without acute osseous abnormality. No joint effusion. Status post left total knee arthroplasty without evidence of hardware failure. Electronically Signed   By: Ashley Royalty M.D.   On: 05/01/2017 18:10    EKG:   Orders placed or performed during the hospital encounter of 11/11/16  . EKG 12-Lead  . EKG 12-Lead    ASSESSMENT AND PLAN:     Benjamin Villarreal  is a 84 y.o. male with a known history of osteoporosis, prostate cancer, B12 deficiency, left leg cellulitis , [facility] ,failed outpatient  antibiotics Ancef and clindamycin is presenting to the ED with worsening of the left leg cellulitis.  Patient had left knee surgery and concerned that this infection might be extending into his knee joint  #Acute left lower extremity cellulitis failed outpatient antibiotics, Admit to MedSurg unit IV vancomycin  x-ray of the knee - generalized soft tissue swelling without acute osseous abnormality.  No joint effusion.  No evidence of hardware failure Neg left lower extremity venous Dopplers for DVT Pain management as needed  #Vitamin B12 deficiency check B12 levels and replete as needed   #Chronic history of osteoporosis Resume home medication  #Chronic history of prostate cancer follow-up with the urologist as recommended   weakness PT assessment  All the records are reviewed and case discussed with Care Management/Social Workerr. Management plans discussed with the patient, family and they are in agreement.  CODE STATUS: fc   TOTAL TIME TAKING CARE OF THIS PATIENT: 36  minutes.   POSSIBLE D/C IN 1-2 DAYS, DEPENDING ON CLINICAL CONDITION.  Note: This dictation was prepared with Dragon dictation along with smaller phrase technology. Any transcriptional errors that result from this process are unintentional.   Nicholes Mango M.D on 05/02/2017 at 3:32 PM  Between 7am to 6pm - Pager - [phone] After 6pm go to [email or web address] - password [email or web address] [facility]  Office  [phone]  CC: Primary care physician; Venia Carbon, MD

## 2017-05-02 NOTE — Progress Notes (Signed)
Clarification received from the pt with his son at the bedside that he thinks he remembers getting a flu shot last fall; unable to clarify with [place] until Monday, 05/04/17 per Abby's voicemail message at [phone]

## 2017-05-03 LAB — VANCOMYCIN, TROUGH: Vancomycin Tr: 15 ug/mL (ref 15–20)

## 2017-05-03 NOTE — Plan of Care (Signed)
  Activity: Risk for activity intolerance will decrease 05/03/2017 1633 - Progressing by Oris Drone, RN  Pt oob to chair x 2; oob to bathroom x 1; 1 person assist this shift Nutrition: Adequate nutrition will be maintained 05/03/2017 1633 - Progressing by Oris Drone, RN  Pt tolerating regular diet; good PO intake Safety: Ability to remain free from injury will improve 05/03/2017 1633 - Progressing by Oris Drone, RN  Pt remains on Moderate Fall Risks this shift Clinical Measurements: Ability to avoid or minimize complications of infection will improve 05/03/2017 1633 - Progressing by Oris Drone, RN  Pt's left lower extremity elevated on a pillow while in bed; while in chair feet up Skin Integrity: Skin integrity will improve 05/03/2017 1633 - Progressing by Oris Drone, RN  Left lower extremity remains red and warm with slight edema

## 2017-05-03 NOTE — Progress Notes (Signed)
Pharmacy Antibiotic Note  Benjamin Villarreal is a 84 y.o. male admitted on 05/01/2017 with cellulitis with concern that infection may be in knee. Patient has history of osteoporosis and left knee surgery. Patient failed Ancef and Clindamycin outpatient. Pharmacy has been consulted for Vancomycin dosing.  Patient received one time dose of vancomycin 1000mg  once in ED.   Plan: Vancomycin 1250 mg Q18H. Will target trough of 15-20 as patient has history of osteoporosis and there is potential for infection to involve knee. Will obtain vancomycin trough prior to 4th dose. Pharmacy will continue to follow and adjust as needed.  Ke= 0.043  T1/2 = 16 hrs Vd = 58 L Cmin = 18.6 Stack dose @ 7 hrs  3/10: Vancomycin trough= 15 mcg/ml. Will continue with current regimen. Will f/u renal fxn and f/u for next vancomycin level.    Height: 6\' 2"  (188 cm) Weight: 185 lb (83.9 kg) IBW/kg (Calculated) : 82.2  Temp (24hrs), Avg:98 F (36.7 C), Min:97.8 F (36.6 C), Max:98.3 F (36.8 C)  Recent Labs  Lab 05/01/17 1458 05/01/17 1721 05/02/17 0524 05/02/17 1557 05/02/17 1749 05/03/17 1130  WBC 6.8  --  5.5  --   --   --   CREATININE 1.13  --  1.08  --   --   --   LATICACIDVEN 2.1* 2.1*  --  1.7 1.5  --   VANCOTROUGH  --   --   --   --   --  15    Estimated Creatinine Clearance: 48.6 mL/min (by C-G formula based on SCr of 1.08 mg/dL).    Allergies  Allergen Reactions  . Other     RAW ONIONS- SINUS REACTION    Antimicrobials this admission: Vancomycin 3/8 >>  Dose adjustments this admission: 3/10  VancT= 15     No chg  Microbiology results: MRSA PCR 3/8 >>  Thank you for allowing pharmacy to be a part of this patient's care.  Chinita Greenland PharmD Clinical Pharmacist 05/03/2017

## 2017-05-03 NOTE — Evaluation (Signed)
Physical Therapy Evaluation Patient Details Name: Benjamin Villarreal MRN: [id number] DOB: [birth year] Today's Date: 05/03/2017   History of Present Illness  84 y.o. male with a known history of osteoporosis, prostate cancer, B12 deficiency, left leg cellulitis , [facility] ,failed outpatient antibiotics Ancef and clindamycin is presenting to the ED with worsening of the left leg cellulitis  Clinical Impression  Pt did well with PT exam and was able to do all mobility w/o assist, also able to walk (slowly and with high step gait pattern to clear toes) 200 ft. He is near his baseline but does feel a little weak, pt will not likely need PT services on d/c, but we will keep him on caseload here in the hospital to insure as much independence and actively tolerance as possible.    Follow Up Recommendations No PT follow up    Equipment Recommendations       Recommendations for Other Services       Precautions / Restrictions Precautions Precautions: Fall Restrictions Weight Bearing Restrictions: No      Mobility  Bed Mobility Overal bed mobility: Independent                Transfers Overall transfer level: Independent Equipment used: Rolling walker (2 wheeled)             General transfer comment: Pt is able to rise to standing w/o assist, heavy use of UEs  Ambulation/Gait Ambulation/Gait assistance: Modified independent (Device/Increase time) Ambulation Distance (Feet): 200 Feet Assistive device: Rolling walker (2 wheeled)       General Gait Details: Pt did well with ambulation, does have altered gait to clear toes (R>L).  No LOBs, no signficant fatigue.  Stairs            Wheelchair Mobility    Modified Rankin (Stroke Patients Only)       Balance Overall balance assessment: Modified Independent                                           Pertinent Vitals/Pain Pain Assessment: No/denies pain    Home Living Family/patient  expects to be discharged to:: Assisted living               Home Equipment: Walker - 4 wheels      Prior Function Level of Independence: Independent with assistive device(s)         Comments: Pt is able to ambulate down to dining home, generally able to be active     Hand Dominance        Extremity/Trunk Assessment   Upper Extremity Assessment Upper Extremity Assessment: Overall WFL for tasks assessed    Lower Extremity Assessment Lower Extremity Assessment: Overall WFL for tasks assessed;Generalized weakness(L foot drop, minimal R ankle AROM, grossly 3+/5)       Communication   Communication: No difficulties  Cognition Arousal/Alertness: Awake/alert Behavior During Therapy: WFL for tasks assessed/performed Overall Cognitive Status: Within Functional Limits for tasks assessed                                        General Comments      Exercises     Assessment/Plan    PT Assessment Patient needs continued PT services  PT Problem List Decreased strength;Decreased range of  motion;Decreased activity tolerance;Decreased balance;Decreased mobility;Decreased coordination;Decreased safety awareness;Decreased knowledge of use of DME       PT Treatment Interventions Gait training;Functional mobility training;DME instruction;Therapeutic activities;Therapeutic exercise;Balance training;Neuromuscular re-education;Patient/family education    PT Goals (Current goals can be found in the Care Plan section)  Acute Rehab PT Goals Patient Stated Goal: go back to ALF PT Goal Formulation: With patient Time For Goal Achievement: 05/17/17 Potential to Achieve Goals: Good    Frequency Min 2X/week   Barriers to discharge        Co-evaluation               AM-PAC PT "6 Clicks" Daily Activity  Outcome Measure Difficulty turning over in bed (including adjusting bedclothes, sheets and blankets)?: None Difficulty moving from lying on back to sitting  on the side of the bed? : None Difficulty sitting down on and standing up from a chair with arms (e.g., wheelchair, bedside commode, etc,.)?: None Help needed moving to and from a bed to chair (including a wheelchair)?: None Help needed walking in hospital room?: None Help needed climbing 3-5 steps with a railing? : None 6 Click Score: 24    End of Session Equipment Utilized During Treatment: Gait belt Activity Tolerance: Patient tolerated treatment well Patient left: with chair alarm set;with call bell/phone within reach Nurse Communication: Mobility status PT Visit Diagnosis: Muscle weakness (generalized) (M62.81);Difficulty in walking, not elsewhere classified (R26.2)    Time: 9357-0177 PT Time Calculation (min) (ACUTE ONLY): 29 min   Charges:   PT Evaluation $PT Eval Low Complexity: 1 Low     PT G CodesKreg Shropshire, DPT 05/03/2017, 5:42 PM

## 2017-05-03 NOTE — Progress Notes (Addendum)
[facility] [place] NAME: Benjamin Villarreal    MR#:  [id number]  DATE OF BIRTH:  [birth year]  SUBJECTIVE:  CHIEF COMPLAINT: Patient's leg redness and swelling are improving  REVIEW OF SYSTEMS:  CONSTITUTIONAL: No fever, fatigue or weakness.  EYES: No blurred or double vision.  EARS, NOSE, AND THROAT: No tinnitus or ear pain.  RESPIRATORY: No cough, shortness of breath, wheezing or hemoptysis.  CARDIOVASCULAR: No chest pain, orthopnea, edema.  GASTROINTESTINAL: No nausea, vomiting, diarrhea or abdominal pain.  GENITOURINARY: No dysuria, hematuria.  ENDOCRINE: No polyuria, nocturia,  HEMATOLOGY: No anemia, easy bruising or bleeding SKIN: No rash or lesion. MUSCULOSKELETAL: Left leg redness and  swelling are improving NEUROLOGIC: No tingling, numbness, weakness.  PSYCHIATRY: No anxiety or depression.   DRUG ALLERGIES:   Allergies  Allergen Reactions  . Other     RAW ONIONS- SINUS REACTION    VITALS:  Blood pressure (!) 110/58, pulse 63, temperature 98 F (36.7 C), temperature source Oral, resp. rate 18, height 6\' 2"  (1.88 m), weight 83.9 kg (185 lb), SpO2 97 %.  PHYSICAL EXAMINATION:  GENERAL:  84 y.o.-year-old patient lying in the bed with no acute distress.  EYES: Pupils equal, round, reactive to light and accommodation. No scleral icterus. Extraocular muscles intact.  HEENT: Head atraumatic, normocephalic. Oropharynx and nasopharynx clear.  NECK:  Supple, no jugular venous distention. No thyroid enlargement, no tenderness.  LUNGS: Normal breath sounds bilaterally, no wheezing, rales,rhonchi or crepitation. No use of accessory muscles of respiration.  CARDIOVASCULAR: S1, S2 normal. No murmurs, rubs, or gallops.  ABDOMEN: Soft, nontender, nondistended. Bowel sounds present. No organomegaly or mass.  EXTREMITIES: Left leg with less erythema and tenderness no pedal edema, cyanosis, or clubbing.  NEUROLOGIC: Cranial nerves II  through XII are intact. Muscle strength 5/5 in all extremities. Sensation intact. Gait not checked.  PSYCHIATRIC: The patient is alert and oriented x 3.  SKIN: No obvious rash, lesion, or ulcer.    LABORATORY PANEL:   CBC Recent Labs  Lab 05/02/17 0524  WBC 5.5  HGB 11.7*  HCT 35.0*  PLT 141*   ------------------------------------------------------------------------------------------------------------------  Chemistries  Recent Labs  Lab 05/02/17 0524  NA 138  K 4.2  CL 110  CO2 20*  GLUCOSE 114*  BUN 20  CREATININE 1.08  CALCIUM 8.5*  AST 23  ALT 12*  ALKPHOS 49  BILITOT 0.7   ------------------------------------------------------------------------------------------------------------------  Cardiac Enzymes No results for input(s): TROPONINI in the last 168 hours. ------------------------------------------------------------------------------------------------------------------  RADIOLOGY:  US Venous Img Lower Unilateral Left  Result Date: 05/02/2017 CLINICAL DATA:  Left lower extremity pain and edema. History of malignancy. Evaluate for DVT. EXAM: LEFT LOWER EXTREMITY VENOUS DOPPLER ULTRASOUND TECHNIQUE: Gray-scale sonography with graded compression, as well as color Doppler and duplex ultrasound were performed to evaluate the lower extremity deep venous systems from the level of the common femoral vein and including the common femoral, femoral, profunda femoral, popliteal and calf veins including the posterior tibial, peroneal and gastrocnemius veins when visible. The superficial great saphenous vein was also interrogated. Spectral Doppler was utilized to evaluate flow at rest and with distal augmentation maneuvers in the common femoral, femoral and popliteal veins. COMPARISON:  None. FINDINGS: Contralateral Common Femoral Vein: Respiratory phasicity is normal and symmetric with the symptomatic side. No evidence of thrombus. Normal compressibility. Common Femoral Vein: No  evidence of thrombus. Normal compressibility, respiratory phasicity and response to augmentation. Saphenofemoral Junction: No evidence of thrombus. Normal compressibility and  flow on color Doppler imaging. Profunda Femoral Vein: No evidence of thrombus. Normal compressibility and flow on color Doppler imaging. Femoral Vein: No evidence of thrombus. Normal compressibility, respiratory phasicity and response to augmentation. Popliteal Vein: No evidence of thrombus. Normal compressibility, respiratory phasicity and response to augmentation. Calf Veins: No evidence of thrombus. Normal compressibility and flow on color Doppler imaging. Superficial Great Saphenous Vein: No evidence of thrombus. Normal compressibility. Venous Reflux:  None. Other Findings:  None. IMPRESSION: No evidence of DVT within the left lower extremity. Electronically Signed   By: Sandi Mariscal M.D.   On: 05/02/2017 09:10   Dg Knee Complete 4 Views Left  Result Date: 05/01/2017 CLINICAL DATA:  Erythema and swelling of the left lower leg for months. EXAM: LEFT KNEE - COMPLETE 4+ VIEW COMPARISON:  None. FINDINGS: Left total knee arthroplasty with two cannulated screws deep to the lateral aspect of the tibial tray. No joint effusion. There is nonspecific mild generalized soft tissue swelling about the left knee. No hardware loosening, fracture nor suspicious osseous lesions. IMPRESSION: Generalized soft tissue swelling about the left knee without acute osseous abnormality. No joint effusion. Status post left total knee arthroplasty without evidence of hardware failure. Electronically Signed   By: Ashley Royalty M.D.   On: 05/01/2017 18:10    EKG:   Orders placed or performed during the hospital encounter of 11/11/16  . EKG 12-Lead  . EKG 12-Lead    ASSESSMENT AND PLAN:     Benjamin Villarreal  is a 84 y.o. male with a known history of osteoporosis, prostate cancer, B12 deficiency, left leg cellulitis , [facility] ,failed outpatient  antibiotics Ancef and clindamycin is presenting to the ED with worsening of the left leg cellulitis.  Patient had left knee surgery and concerned that this infection might be extending into his knee joint  #Acute left lower extremity cellulitis failed outpatient antibiotics, Clinically improving IV vancomycin will be continued and changed to Augmentin at the time of discharge  x-ray of the knee - generalized soft tissue swelling without acute osseous abnormality.  No joint effusion.  No evidence of hardware failure Neg left lower extremity venous Dopplers for DVT Pain management as needed  #Vitamin B12 deficiency check B12 levels and replete as needed   #Chronic history of osteoporosis Resume home medication  #Chronic history of prostate cancer follow-up with the urologist as recommended   encourage out of bed and ambulate as tolerated  weakness PT assessment  All the records are reviewed and case discussed with Care Management/Social Workerr. Management plans discussed with the patient,  daughter at bedside and they are in agreement.  CODE STATUS: fc   TOTAL TIME TAKING CARE OF THIS PATIENT: 36  minutes.   POSSIBLE D/C IN 1-2 DAYS, DEPENDING ON CLINICAL CONDITION.  Note: This dictation was prepared with Dragon dictation along with smaller phrase technology. Any transcriptional errors that result from this process are unintentional.   Nicholes Mango M.D on 05/03/2017 at 2:12 PM  Between 7am to 6pm - Pager - [phone] After 6pm go to [email or web address] - password [email or web address] [facility]  Office  [phone]  CC: Primary care physician; Venia Carbon, MD

## 2017-05-04 LAB — CREATININE, SERUM
Creatinine, Ser: 0.9 mg/dL (ref 0.61–1.24)
GFR calc Af Amer: >60 mL/min (ref >60)
GFR calc non Af Amer: >60 mL/min (ref >60)

## 2017-05-04 MED ORDER — MUPIROCIN 2 % EX OINT: 1.0000 "application " | TOPICAL_OINTMENT | Freq: Two times a day (BID) | CUTANEOUS | 0 refills | Status: DC

## 2017-05-04 MED ORDER — FERROUS SULFATE 325 (65 FE) MG PO TABS: 325.0000 mg | ORAL_TABLET | Freq: Two times a day (BID) | ORAL | Status: DC

## 2017-05-04 MED ORDER — SULFAMETHOXAZOLE-TRIMETHOPRIM 800-160 MG PO TABS: 1.0000 | ORAL_TABLET | Freq: Two times a day (BID) | ORAL | 0 refills | Status: DC

## 2017-05-04 MED ORDER — TRAMADOL HCL 50 MG PO TABS: 50.0000 mg | ORAL_TABLET | Freq: Four times a day (QID) | ORAL | 0 refills | Status: DC | PRN

## 2017-05-04 NOTE — NC FL2 (Addendum)
[place] LEVEL OF CARE SCREENING TOOL     IDENTIFICATION  Patient Name: Benjamin Villarreal Birthdate: May 01, 1920 Sex: male Admission Date (Current Location): 05/01/2017  [place] and [place] Number:  Engineering geologist and Address:  [facility], [address], [place], [place] [address]      Provider Number: [id number]  Attending Physician Name and Address:  Nicholes Mango, MD  Relative Name and Phone Number:  Brashear,[phone]    Current Level of Care: Hospital Recommended Level of Care: [place] Prior Approval Number:    Date Approved/Denied:   PASRR Number:    Discharge Plan: Other (Comment)([facility])    Current Diagnoses: Patient Active Problem List   Diagnosis Date Noted  . Aortic atherosclerosis ([place]) 10/09/2016  . Iron deficiency anemia due to chronic blood loss 10/05/2016  . Peripheral neuropathy 01/24/2016  . 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 Allergic rhinitis due to pollen 10/21/2012  . Prostate cancer ([place]) 03/22/2009  . Chronic venous insufficiency 09/07/2008  . CONSTIPATION, CHRONIC 09/07/2008  . Osteoarthritis, multiple sites 09/05/2008  . Osteoporosis 09/05/2008    Orientation RESPIRATION BLADDER Height & Weight     Self, Time, Place, Situation  Normal Continent Weight: 185 lb (83.9 kg) Height:  6\' 2"  (188 cm)  BEHAVIORAL SYMPTOMS/MOOD NEUROLOGICAL BOWEL NUTRITION STATUS      Continent Diet(Regular diet)  AMBULATORY STATUS COMMUNICATION OF NEEDS Skin   Supervision Verbally Normal                       Personal Care Assistance Level of Assistance  Bathing, Feeding, Dressing Bathing Assistance: Limited assistance Feeding assistance: Independent Dressing Assistance: Limited assistance     Functional Limitations Info  Sight, Hearing, Speech Sight Info: Adequate Hearing Info: Adequate Speech Info: Adequate    SPECIAL CARE FACTORS FREQUENCY                       Contractures Contractures Info: Not present    Additional Factors Info  Code Status, Allergies Code Status Info: Full Code Allergies Info: RAW ONIONS           Current Medications (05/04/2017):  This is the current hospital active medication list Current Facility-Administered Medications  Medication Dose Route Frequency Provider Last Rate Last Dose  . acetaminophen (TYLENOL) tablet 650 mg  650 mg Oral Q6H PRN Gouru, Aruna, MD       Or  . acetaminophen (TYLENOL) suppository 650 mg  650 mg Rectal Q6H PRN Gouru, Aruna, MD      . calcium-vitamin D (OSCAL WITH D) 500-200 MG-UNIT per tablet 2 tablet  2 tablet Oral Q breakfast Gouru, Aruna, MD   2 tablet at 05/04/17 0943  . Chlorhexidine Gluconate Cloth 2 % PADS 6 each  6 each Topical Q0600 Nicholes Mango, MD   6 each at 05/04/17 0526  . cholecalciferol (VITAMIN D) tablet 1,000 Units  1,000 Units Oral BID Nicholes Mango, MD   1,000 Units at 05/04/17 0943  . diphenhydrAMINE (BENADRYL) capsule 25 mg  25 mg Oral Q4H PRN Gouru, Aruna, MD      . enoxaparin (LOVENOX) injection 40 mg  40 mg Subcutaneous Q24H Gouru, Aruna, MD   40 mg at 05/03/17 2032  . ferrous sulfate tablet 325 mg  325 mg Oral BID WC Gouru, Aruna, MD   325 mg at 05/04/17 0943  . ibuprofen (ADVIL,MOTRIN) tablet 400 mg  400 mg Oral Q8H PRN Gouru,  Illene Silver, MD      . latanoprost (XALATAN) 0.005 % ophthalmic solution 1 drop  1 drop Both Eyes QHS Gouru, Aruna, MD   1 drop at 05/03/17 2032  . loratadine (CLARITIN) tablet 10 mg  10 mg Oral Daily Gouru, Aruna, MD   10 mg at 05/04/17 0943  . mupirocin ointment (BACTROBAN) 2 % 1 application  1 application Nasal BID Nicholes Mango, MD   1 application at 08/24/14 0944  . polyethylene glycol powder (GLYCOLAX/MIRALAX) container 17 g  17 g Oral Q1400 Gouru, Aruna, MD   17 g at 05/03/17 1744  . senna-docusate (Senokot-S) tablet 2 tablet  2 tablet Oral BID Nicholes Mango, MD   2 tablet at 05/04/17 0943  . traMADol (ULTRAM) tablet 50 mg  50 mg Oral  Q6H PRN Gouru, Aruna, MD      . vancomycin (VANCOCIN) 1,250 mg in sodium chloride 0.9 % 250 mL IVPB  1,250 mg Intravenous Q18H Lifsey, Betti Cruz, RPH   Stopped at 05/04/17 0701  . vitamin B-12 (CYANOCOBALAMIN) tablet 1,000 mcg  1,000 mcg Oral Daily Gouru, Aruna, MD   1,000 mcg at 05/04/17 0943  . vitamin C (ASCORBIC ACID) tablet 500 mg  500 mg Oral Daily Gouru, Aruna, MD   500 mg at 05/04/17 0109   Facility-Administered Medications Ordered in Other Encounters  Medication Dose Route Frequency Provider Last Rate Last Dose  . leuprolide (LUPRON) injection 30 mg  30 mg Intramuscular Once Leia Alf, MD         Discharge Medications:  STOP taking these medications   cephALEXin 500 MG capsule Commonly known as:  KEFLEX   clindamycin 150 MG capsule Commonly known as:  CLEOCIN     TAKE these medications   acetaminophen 325 MG tablet Commonly known as:  TYLENOL Take 325-650 mg by mouth every 4 (four) hours as needed.   alum & mag hydroxide-simeth 200-200-20 MG/5ML suspension Commonly known as:  MAALOX/MYLANTA Take 30 mLs by mouth every 4 (four) hours as needed for indigestion or heartburn.   aspirin EC 81 MG tablet Take 81 mg by mouth every other day.   Biotin 2.5 MG Caps Take 2.5 mg by mouth daily.   Calcium-D 600-400 MG-UNIT Tabs Take 1 tablet by mouth daily.   carbamide peroxide 6.5 % OTIC solution Commonly known as:  DEBROX 5 drops daily as needed.   cholecalciferol 1000 units tablet Commonly known as:  VITAMIN D Take 1,000 Units by mouth 2 (two) times daily.   cyclobenzaprine 5 MG tablet Commonly known as:  FLEXERIL Take 5 mg by mouth at bedtime as needed for muscle spasms.   diphenhydrAMINE 25 mg capsule Commonly known as:  BENADRYL Take 25 mg by mouth every 4 (four) hours as needed for itching or allergies.   ferrous sulfate 325 (65 FE) MG tablet Take 1 tablet (325 mg total) by mouth 2 (two) times daily with a meal.   fluticasone 50 MCG/ACT  nasal spray Commonly known as:  FLONASE Place 1 spray into both nostrils daily.   ibuprofen 200 MG tablet Commonly known as:  ADVIL,MOTRIN Take 400 mg by mouth 2 (two) times daily as needed for headache or mild pain.   KAOPECTATE 262 MG/15ML suspension Generic drug:  bismuth subsalicylate Take 10 mLs by mouth 3 (three) times daily as needed.   latanoprost 0.005 % ophthalmic solution Commonly known as:  XALATAN Place 1 drop into both eyes at bedtime.   leuprolide 30 MG injection Commonly known as:  LUPRON  Inject 30 mg into the muscle every 4 (four) months. Dose and interval not clear yet   loratadine 10 MG tablet Commonly known as:  CLARITIN Take 20 mg by mouth at bedtime.   magnesium hydroxide 400 MG/5ML suspension Commonly known as:  MILK OF MAGNESIA Take 30 mLs by mouth daily as needed for mild constipation.   mupirocin ointment 2 % Commonly known as:  BACTROBAN Place 1 application into the nose 2 (two) times daily.   nystatin powder Generic drug:  nystatin Apply topically 2 (two) times daily as needed. Use 1 application twice daily as needed for rash   polyethylene glycol powder powder Commonly known as:  GLYCOLAX/MIRALAX FILL TO LINE (17 GRAMS ), MIX AND DRINK TWICE A DAY AND 8.5 GRAMS AT NOON   sennosides-docusate sodium 8.6-50 MG tablet Commonly known as:  SENOKOT-S Take 4 tablets by mouth every evening.   sodium chloride 0.65 % Soln nasal spray Commonly known as:  OCEAN Place 1-2 sprays into both nostrils 2 (two) times daily as needed for congestion.   sulfamethoxazole-trimethoprim 800-160 MG tablet Commonly known as:  BACTRIM DS,SEPTRA DS Take 1 tablet by mouth 2 (two) times daily.   traMADol 50 MG tablet Commonly known as:  ULTRAM Take 1 tablet (50 mg total) by mouth every 6 (six) hours as needed for moderate pain. What changed:    when to take this  reasons to take this  Another medication with the same name was removed. Continue  taking this medication, and follow the directions you see here.   triamcinolone cream 0.1 % Commonly known as:  KENALOG Apply 1 application topically daily as needed.   vitamin B-12 1000 MCG tablet Commonly known as:  CYANOCOBALAMIN Take 1,000 mcg by mouth daily.   vitamin C 500 MG tablet Commonly known as:  ASCORBIC ACID Take 500 mg by mouth daily.   XTANDI 40 MG capsule Generic drug:  enzalutamide TAKE 3 CAPSULES (120 MG TOTAL) BY MOUTH DAILY.   zoledronic acid 5 MG/100ML Soln injection Commonly known as:  RECLAST Inject 5 mg into the vein every 6 (six) months.         Relevant Imaging Results:  Relevant Lab Results:   Additional Information SSN [id number]  Ross Ludwig, [place]

## 2017-05-04 NOTE — Care Management Important Message (Signed)
Important Message  Patient Details  Name: Benjamin Villarreal MRN: [id number] Date of Birth: [birth year]   Medicare Important Message Given:  Yes  Signed IM notice given   Katrina Stack, RN 05/04/2017, 2:09 PM

## 2017-05-04 NOTE — Discharge Instructions (Signed)
Follow-up with primary care physician at the facility Follow-up with podiatry and orthopedics as recommended

## 2017-05-04 NOTE — Discharge Summary (Signed)
[facility] NAME: Jadie Comas    MR#:  [id number]  DATE OF BIRTH:  1920/12/07  DATE OF ADMISSION:  05/01/2017 ADMITTING PHYSICIAN: Nicholes Mango, MD  DATE OF DISCHARGE:  05/04/17  PRIMARY CARE PHYSICIAN: Venia Carbon, MD    ADMISSION DIAGNOSIS:  Edema [R60.9] Lactic acidosis [E87.2] Cellulitis of left lower extremity [V89.381] Septic joint ([place]) [M00.9]  DISCHARGE DIAGNOSIS:   Left lower extremity cellulitis History of fracture of left foot in November 2018 SECONDARY DIAGNOSIS:   Past Medical History:  Diagnosis Date  . Aortic atherosclerosis ([place])    Noted on CT  . Arthritis   . Colon polyps    adenomatous  . History of fracture of tibia   . History of tachycardia    patient unaware of this  . Kyphosis of cervicothoracic region    SEVERE  . Osteoporosis 01/2014   T -3.8 (01/2014)  . Other constipation    chronic  . Prostate cancer ([place])    surgery then lupron  . Unspecified venous (peripheral) insufficiency   . Vitamin B12 deficiency     HOSPITAL COURSE:   HPI  Benjamin Villarreal  is a 84 y.o. male with a known history of osteoporosis, prostate cancer, B12 deficiency, left leg cellulitis , seen by [facility] ,failed outpatient antibiotics Ancef and clindamycin is presenting to the ED with worsening of the left leg cellulitis.  Patient had left knee surgery and concerned that this infection might be extending into his knee joint daughter at bedside denies any fever lactic acid is elevated at 2.1 but no leukocytosis  #Acute left lower extremity cellulitis failed outpatient antibiotics, Clinically improving IV vancomycin  change to  Bactrim at the time of discharge  x-ray of the knee - generalized soft tissue swelling without acute osseous abnormality.  No joint effusion.  No evidence of hardware failure Neg left lower extremity venous Dopplers for DVT Pain management as needed History of left foot fracture  with 2 metatarsal fractures per daughter in November 2018 recommended outpatient orthopedics follow-up Keep legs elevated and TED hose   #Vitamin B12 deficiency check B12 levels and replete as needed   #Chronic history of osteoporosis Resume home medication  #Chronic history of prostate cancer follow-up with the urologist as recommended   encourage out of bed and ambulate as tolerated  weakness PT assessed the patient and no PT needs identified  Disposition [facility]  DISCHARGE CONDITIONS:  stable  CONSULTS OBTAINED:     PROCEDURES  None   DRUG ALLERGIES:   Allergies  Allergen Reactions  . Other     RAW ONIONS- SINUS REACTION    DISCHARGE MEDICATIONS:   Allergies as of 05/04/2017      Reactions   Other    RAW ONIONS- SINUS REACTION      Medication List    STOP taking these medications   cephALEXin 500 MG capsule Commonly known as:  KEFLEX   clindamycin 150 MG capsule Commonly known as:  CLEOCIN     TAKE these medications   acetaminophen 325 MG tablet Commonly known as:  TYLENOL Take 325-650 mg by mouth every 4 (four) hours as needed.   alum & mag hydroxide-simeth 200-200-20 MG/5ML suspension Commonly known as:  MAALOX/MYLANTA Take 30 mLs by mouth every 4 (four) hours as needed for indigestion or heartburn.   aspirin EC 81 MG tablet Take 81 mg by mouth every other day.   Biotin 2.5 MG  Caps Take 2.5 mg by mouth daily.   Calcium-D 600-400 MG-UNIT Tabs Take 1 tablet by mouth daily.   carbamide peroxide 6.5 % OTIC solution Commonly known as:  DEBROX 5 drops daily as needed.   cholecalciferol 1000 units tablet Commonly known as:  VITAMIN D Take 1,000 Units by mouth 2 (two) times daily.   cyclobenzaprine 5 MG tablet Commonly known as:  FLEXERIL Take 5 mg by mouth at bedtime as needed for muscle spasms.   diphenhydrAMINE 25 mg capsule Commonly known as:  BENADRYL Take 25 mg by mouth every 4 (four) hours as needed for itching or  allergies.   ferrous sulfate 325 (65 FE) MG tablet Take 1 tablet (325 mg total) by mouth 2 (two) times daily with a meal.   fluticasone 50 MCG/ACT nasal spray Commonly known as:  FLONASE Place 1 spray into both nostrils daily.   ibuprofen 200 MG tablet Commonly known as:  ADVIL,MOTRIN Take 400 mg by mouth 2 (two) times daily as needed for headache or mild pain.   KAOPECTATE 262 MG/15ML suspension Generic drug:  bismuth subsalicylate Take 10 mLs by mouth 3 (three) times daily as needed.   latanoprost 0.005 % ophthalmic solution Commonly known as:  XALATAN Place 1 drop into both eyes at bedtime.   leuprolide 30 MG injection Commonly known as:  LUPRON Inject 30 mg into the muscle every 4 (four) months. Dose and interval not clear yet   loratadine 10 MG tablet Commonly known as:  CLARITIN Take 20 mg by mouth at bedtime.   magnesium hydroxide 400 MG/5ML suspension Commonly known as:  MILK OF MAGNESIA Take 30 mLs by mouth daily as needed for mild constipation.   mupirocin ointment 2 % Commonly known as:  BACTROBAN Place 1 application into the nose 2 (two) times daily.   nystatin powder Generic drug:  nystatin Apply topically 2 (two) times daily as needed. Use 1 application twice daily as needed for rash   polyethylene glycol powder powder Commonly known as:  GLYCOLAX/MIRALAX FILL TO LINE (17 GRAMS ), MIX AND DRINK TWICE A DAY AND 8.5 GRAMS AT NOON   sennosides-docusate sodium 8.6-50 MG tablet Commonly known as:  SENOKOT-S Take 4 tablets by mouth every evening.   sodium chloride 0.65 % Soln nasal spray Commonly known as:  OCEAN Place 1-2 sprays into both nostrils 2 (two) times daily as needed for congestion.   sulfamethoxazole-trimethoprim 800-160 MG tablet Commonly known as:  BACTRIM DS,SEPTRA DS Take 1 tablet by mouth 2 (two) times daily.   traMADol 50 MG tablet Commonly known as:  ULTRAM Take 1 tablet (50 mg total) by mouth every 6 (six) hours as needed for  moderate pain. What changed:    when to take this  reasons to take this  Another medication with the same name was removed. Continue taking this medication, and follow the directions you see here.   triamcinolone cream 0.1 % Commonly known as:  KENALOG Apply 1 application topically daily as needed.   vitamin B-12 1000 MCG tablet Commonly known as:  CYANOCOBALAMIN Take 1,000 mcg by mouth daily.   vitamin C 500 MG tablet Commonly known as:  ASCORBIC ACID Take 500 mg by mouth daily.   XTANDI 40 MG capsule Generic drug:  enzalutamide TAKE 3 CAPSULES (120 MG TOTAL) BY MOUTH DAILY.   zoledronic acid 5 MG/100ML Soln injection Commonly known as:  RECLAST Inject 5 mg into the vein every 6 (six) months.  DISCHARGE INSTRUCTIONS:   Follow-up with primary care physician at the facility Follow-up with podiatry and orthopedics as recommended  DIET:  Regular diet  DISCHARGE CONDITION:  Stable  ACTIVITY:  Activity as tolerated  OXYGEN:  Home Oxygen: No.   Oxygen Delivery: room air  DISCHARGE LOCATION:  [facility]   If you experience worsening of your admission symptoms, develop shortness of breath, life threatening emergency, suicidal or homicidal thoughts you must seek medical attention immediately by calling 911 or calling your MD immediately  if symptoms less severe.  You Must read complete instructions/literature along with all the possible adverse reactions/side effects for all the Medicines you take and that have been prescribed to you. Take any new Medicines after you have completely understood and accpet all the possible adverse reactions/side effects.   Please note  You were cared for by a hospitalist during your hospital stay. If you have any questions about your discharge medications or the care you received while you were in the hospital after you are discharged, you can call the unit and asked to speak with the hospitalist on call if the hospitalist that  took care of you is not available. Once you are discharged, your primary care physician will handle any further medical issues. Please note that NO REFILLS for any discharge medications will be authorized once you are discharged, as it is imperative that you return to your primary care physician (or establish a relationship with a primary care physician if you do not have one) for your aftercare needs so that they can reassess your need for medications and monitor your lab values.     Today  Chief Complaint  Patient presents with  . Cellulitis   Pt is doing much better.  Left leg redness significantly improved.  Daughter at bedside.  Okay to discharge patient to [facility]  ROS:  CONSTITUTIONAL: Denies fevers, chills. Denies any fatigue, weakness.  EYES: Denies blurry vision, double vision, eye pain. EARS, NOSE, THROAT: Denies tinnitus, ear pain, hearing loss. RESPIRATORY: Denies cough, wheeze, shortness of breath.  CARDIOVASCULAR: Denies chest pain, palpitations, edema.  GASTROINTESTINAL: Denies nausea, vomiting, diarrhea, abdominal pain. Denies bright red blood per rectum. GENITOURINARY: Denies dysuria, hematuria. ENDOCRINE: Denies nocturia or thyroid problems. HEMATOLOGIC AND LYMPHATIC: Denies easy bruising or bleeding. SKIN: Denies rash or lesion. MUSCULOSKELETAL: Denies pain in neck, back, shoulder, knees, hips or arthritic symptoms.  NEUROLOGIC: Denies paralysis, paresthesias.  PSYCHIATRIC: Denies anxiety or depressive symptoms.   VITAL SIGNS:  Blood pressure (!) 147/62, pulse 68, temperature 98 F (36.7 C), temperature source Oral, resp. rate 16, height 6\' 2"  (1.88 m), weight 83.9 kg (185 lb), SpO2 95 %.  I/O:    Intake/Output Summary (Last 24 hours) at 05/04/2017 1352 Last data filed at 05/04/2017 1226 Gross per 24 hour  Intake 253.12 ml  Output 2725 ml  Net -2471.88 ml    PHYSICAL EXAMINATION:  GENERAL:  84 y.o.-year-old patient lying in the bed with no acute  distress.  EYES: Pupils equal, round, reactive to light and accommodation. No scleral icterus. Extraocular muscles intact.  HEENT: Head atraumatic, normocephalic. Oropharynx and nasopharynx clear.  NECK:  Supple, no jugular venous distention. No thyroid enlargement, no tenderness.  LUNGS: Normal breath sounds bilaterally, no wheezing, rales,rhonchi or crepitation. No use of accessory muscles of respiration.  CARDIOVASCULAR: S1, S2 normal. No murmurs, rubs, or gallops.  ABDOMEN: Soft, non-tender, non-distended. Bowel sounds present. No organomegaly or mass.  EXTREMITIES: Left lower extremity erythema, edema and tenderness significantly  improved  no pedal edema, cyanosis, or clubbing.  NEUROLOGIC: Cranial nerves II through XII are intact. Muscle strength 5/5 in all extremities. Sensation intact. Gait not checked.  PSYCHIATRIC: The patient is alert and oriented x 3.  SKIN: No obvious rash, lesion, or ulcer.   DATA REVIEW:   CBC Recent Labs  Lab 05/02/17 0524  WBC 5.5  HGB 11.7*  HCT 35.0*  PLT 141*    Chemistries  Recent Labs  Lab 05/02/17 0524 05/04/17 0425  NA 138  --   K 4.2  --   CL 110  --   CO2 20*  --   GLUCOSE 114*  --   BUN 20  --   CREATININE 1.08 0.90  CALCIUM 8.5*  --   AST 23  --   ALT 12*  --   ALKPHOS 49  --   BILITOT 0.7  --     Cardiac Enzymes No results for input(s): TROPONINI in the last 168 hours.  Microbiology Results  Results for orders placed or performed during the hospital encounter of 05/01/17  MRSA PCR Screening     Status: Abnormal   Collection Time: 05/01/17  7:11 PM  Result Value Ref Range Status   MRSA by PCR POSITIVE (A) NEGATIVE Final    Comment:        The GeneXpert MRSA Assay (FDA approved for NASAL specimens only), is one component of a comprehensive MRSA colonization surveillance program. It is not intended to diagnose MRSA infection nor to guide or monitor treatment for MRSA infections. RESULT CALLED TO, READ BACK BY AND  VERIFIED WITH: [facility] RN AT 2125 05/01/17. MSS Performed at [facility], [address] [place], [place] [address]     RADIOLOGY:  US Venous Img Lower Unilateral Left  Result Date: 05/02/2017 CLINICAL DATA:  Left lower extremity pain and edema. History of malignancy. Evaluate for DVT. EXAM: LEFT LOWER EXTREMITY VENOUS DOPPLER ULTRASOUND TECHNIQUE: Gray-scale sonography with graded compression, as well as color Doppler and duplex ultrasound were performed to evaluate the lower extremity deep venous systems from the level of the common femoral vein and including the common femoral, femoral, profunda femoral, popliteal and calf veins including the posterior tibial, peroneal and gastrocnemius veins when visible. The superficial great saphenous vein was also interrogated. Spectral Doppler was utilized to evaluate flow at rest and with distal augmentation maneuvers in the common femoral, femoral and popliteal veins. COMPARISON:  None. FINDINGS: Contralateral Common Femoral Vein: Respiratory phasicity is normal and symmetric with the symptomatic side. No evidence of thrombus. Normal compressibility. Common Femoral Vein: No evidence of thrombus. Normal compressibility, respiratory phasicity and response to augmentation. Saphenofemoral Junction: No evidence of thrombus. Normal compressibility and flow on color Doppler imaging. Profunda Femoral Vein: No evidence of thrombus. Normal compressibility and flow on color Doppler imaging. Femoral Vein: No evidence of thrombus. Normal compressibility, respiratory phasicity and response to augmentation. Popliteal Vein: No evidence of thrombus. Normal compressibility, respiratory phasicity and response to augmentation. Calf Veins: No evidence of thrombus. Normal compressibility and flow on color Doppler imaging. Superficial Great Saphenous Vein: No evidence of thrombus. Normal compressibility. Venous Reflux:  None. Other Findings:  None. IMPRESSION: No  evidence of DVT within the left lower extremity. Electronically Signed   By: Sandi Mariscal M.D.   On: 05/02/2017 09:10   Dg Knee Complete 4 Views Left  Result Date: 05/01/2017 CLINICAL DATA:  Erythema and swelling of the left lower leg for months. EXAM: LEFT KNEE - COMPLETE  4+ VIEW COMPARISON:  None. FINDINGS: Left total knee arthroplasty with two cannulated screws deep to the lateral aspect of the tibial tray. No joint effusion. There is nonspecific mild generalized soft tissue swelling about the left knee. No hardware loosening, fracture nor suspicious osseous lesions. IMPRESSION: Generalized soft tissue swelling about the left knee without acute osseous abnormality. No joint effusion. Status post left total knee arthroplasty without evidence of hardware failure. Electronically Signed   By: Ashley Royalty M.D.   On: 05/01/2017 18:10    EKG:   Orders placed or performed during the hospital encounter of 11/11/16  . EKG 12-Lead  . EKG 12-Lead      Management plans discussed with the patient, family and they are in agreement.  CODE STATUS:     Code Status Orders  (From admission, onward)        Start     Ordered   05/01/17 1841  Full code  Continuous     05/01/17 1840    Code Status History    Date Active Date Inactive Code Status Order ID Comments User Context   This patient has a current code status but no historical code status.    Advance Directive Documentation     Most Recent Value  Type of Advance Directive  Healthcare Power of Attorney, Living will  Pre-existing out of facility DNR order (yellow form or pink MOST form)  No data  "MOST" Form in Place?  No data      TOTAL TIME TAKING CARE OF THIS PATIENT: 45  minutes.   Note: This dictation was prepared with Dragon dictation along with smaller phrase technology. Any transcriptional errors that result from this process are unintentional.   @[facility] @  on 05/04/2017 at 1:52 PM  Between 7am to 6pm - Pager - [phone]  After  6pm go to [email or web address] - password [email or web address] [facility]  Office  [phone]  CC: Primary care physician; Venia Carbon, MD

## 2017-05-04 NOTE — Clinical Social Work Note (Addendum)
Patient to be d/c'ed today to [facility].  Patient and family agreeable to plans will transport via daughter's car RN to call report to [facility] at [phone].  Patient's daughter is at bedside and is aware of discharge to ALF.  Evette Cristal, MSW, [place]

## 2017-05-04 NOTE — Progress Notes (Addendum)
IV was removed. Discharge instructions, follow-up appointments, and prescriptions were provided to the pt and daughter at bedside. All questions were answered.  The pt was taken downstairs via wheelchair by volunteers. Report called to Manuela Schwartz at [facility].

## 2017-05-04 NOTE — Clinical Social Work Note (Signed)
Clinical Social Work Assessment  Patient Details  Name: Benjamin Villarreal MRN: [id number] Date of Birth: 10-Nov-1920  Date of referral:  05/04/17               Reason for consult:  Facility Placement                Permission sought to share information with:  Family Supports, Customer service manager Permission granted to share information::  Yes, Verbal Permission Granted  Name::     [name] Daughter [phone] or Godfrey,Lee Daughter [phone] or Virl Cagey. Son [phone]   Agency::  [facility]  Relationship::     Contact Information:     Housing/Transportation Living arrangements for the past 2 months:  Assisted Living Facility([facility]) Source of Information:  Patient Patient Interpreter Needed:  None Criminal Activity/Legal Involvement Pertinent to Current Situation/Hospitalization:  No - Comment as needed Significant Relationships:  Adult Children Lives with:  Facility Resident Do you feel safe going back to the place where you live?  Yes Need for family participation in patient care:  No (Coment)  Care giving concerns:  Patient expressed that he is pleased with the care at [facility].   Social Worker assessment / plan:  Patient is a 84 year old male who is a resident at [facility].  Patient has been there for about 4 years, patient is alert and oriented x4.  Patient states that he is happy with the nurses at the ALF, and they know him well.  Patient expressed he is ready to discharge from the hospital.  Patient did not express any other concerns or issues with ALF.  Patient did not have any other questions or concerns.  Employment status:  Retired Forensic scientist:  Medicare PT Recommendations:  No Follow Up Information / Referral to community resources:     Patient/Family's Response to care:  Patient is in agreement to returning back to ALF.  Patient/Family's Understanding of and Emotional Response to Diagnosis, Current Treatment, and  Prognosis:  Patient is aware of curren treatment plan and prognosis.  Emotional Assessment Appearance:  Appears stated age Attitude/Demeanor/Rapport:    Affect (typically observed):  Calm, Stable Orientation:  Oriented to  Time, Oriented to Place, Oriented to Self, Oriented to Situation Alcohol / Substance use:  Not Applicable Psych involvement (Current and /or in the community):  No (Comment)  Discharge Needs  Concerns to be addressed:  Care Coordination Readmission within the last 30 days:  No Current discharge risk:  None Barriers to Discharge:  No Barriers Identified   Ross Ludwig, LCSWA 05/04/2017, 3:00 PM

## 2017-05-05 ENCOUNTER — Encounter: Payer: Self-pay | Admitting: Emergency Medicine

## 2017-05-05 ENCOUNTER — Telehealth: Payer: Self-pay | Admitting: Internal Medicine

## 2017-05-05 ENCOUNTER — Inpatient Hospital Stay
Admission: EM | Admit: 2017-05-05 | Discharge: 2017-05-07 | DRG: 603 | Disposition: A | Discharge status: Home Health | Payer: Medicare Other | Attending: Internal Medicine | Admitting: Internal Medicine

## 2017-05-05 ENCOUNTER — Other Ambulatory Visit: Payer: Self-pay

## 2017-05-05 ENCOUNTER — Telehealth: Payer: Self-pay | Admitting: Behavioral Health

## 2017-05-05 DIAGNOSIS — I872 Venous insufficiency (chronic) (peripheral): Secondary | ICD-10-CM | POA: Diagnosis not present

## 2017-05-05 DIAGNOSIS — M810 Age-related osteoporosis without current pathological fracture: Secondary | ICD-10-CM | POA: Diagnosis present

## 2017-05-05 DIAGNOSIS — L0390 Cellulitis, unspecified: Secondary | ICD-10-CM | POA: Diagnosis not present

## 2017-05-05 DIAGNOSIS — Z96653 Presence of artificial knee joint, bilateral: Secondary | ICD-10-CM | POA: Diagnosis present

## 2017-05-05 DIAGNOSIS — Z85828 Personal history of other malignant neoplasm of skin: Secondary | ICD-10-CM

## 2017-05-05 DIAGNOSIS — I700 Atherosclerosis of aorta: Secondary | ICD-10-CM | POA: Diagnosis not present

## 2017-05-05 DIAGNOSIS — K5909 Other constipation: Secondary | ICD-10-CM | POA: Diagnosis present

## 2017-05-05 DIAGNOSIS — C61 Malignant neoplasm of prostate: Secondary | ICD-10-CM | POA: Diagnosis present

## 2017-05-05 DIAGNOSIS — Z7982 Long term (current) use of aspirin: Secondary | ICD-10-CM

## 2017-05-05 DIAGNOSIS — B955 Unspecified streptococcus as the cause of diseases classified elsewhere: Secondary | ICD-10-CM | POA: Diagnosis not present

## 2017-05-05 DIAGNOSIS — L03116 Cellulitis of left lower limb: Secondary | ICD-10-CM | POA: Diagnosis not present

## 2017-05-05 DIAGNOSIS — Z8546 Personal history of malignant neoplasm of prostate: Secondary | ICD-10-CM

## 2017-05-05 DIAGNOSIS — Z91018 Allergy to other foods: Secondary | ICD-10-CM

## 2017-05-05 DIAGNOSIS — Z79899 Other long term (current) drug therapy: Secondary | ICD-10-CM

## 2017-05-05 LAB — COMPREHENSIVE METABOLIC PANEL
ALBUMIN: 4.2 g/dL (ref 3.5–5.0)
ALK PHOS: 55 U/L (ref 38–126)
ALT: 16 U/L — ABNORMAL LOW (ref 17–63)
ANION GAP: 10 (ref 5–15)
AST: 27 U/L (ref 15–41)
BILIRUBIN TOTAL: 0.8 mg/dL (ref 0.3–1.2)
BUN: 23 mg/dL — AB (ref 6–20)
CO2: 21 mmol/L — AB (ref 22–32)
Calcium: 9.3 mg/dL (ref 8.9–10.3)
Chloride: 103 mmol/L (ref 101–111)
Creatinine, Ser: 1.19 mg/dL (ref 0.61–1.24)
GFR calc Af Amer: 58 mL/min — ABNORMAL LOW (ref >60)
GFR calc non Af Amer: 50 mL/min — ABNORMAL LOW (ref >60)
GLUCOSE: 104 mg/dL — AB (ref 65–99)
POTASSIUM: 4.3 mmol/L (ref 3.5–5.1)
Sodium: 134 mmol/L — ABNORMAL LOW (ref 135–145)
TOTAL PROTEIN: 7.8 g/dL (ref 6.5–8.1)

## 2017-05-05 LAB — CBC WITH DIFFERENTIAL/PLATELET
BASOS ABS: 0.1 10*3/uL (ref 0–0.1)
BASOS PCT: 1 %
EOS ABS: 0.5 10*3/uL (ref 0–0.7)
Eosinophils Relative: 7 %
HEMATOCRIT: 37.6 % — AB (ref 40.0–52.0)
HEMOGLOBIN: 12.6 g/dL — AB (ref 13.0–18.0)
Lymphocytes Relative: 26 %
Lymphs Abs: 1.9 10*3/uL (ref 1.0–3.6)
MCH: 30.8 pg (ref 26.0–34.0)
MCHC: 33.6 g/dL (ref 32.0–36.0)
MCV: 91.6 fL (ref 80.0–100.0)
Monocytes Absolute: 0.8 10*3/uL (ref 0.2–1.0)
Monocytes Relative: 11 %
NEUTROS ABS: 4.1 10*3/uL (ref 1.4–6.5)
Neutrophils Relative %: 55 %
Platelets: 160 10*3/uL (ref 150–440)
RBC: 4.11 MIL/uL — ABNORMAL LOW (ref 4.40–5.90)
RDW: 13.9 % (ref 11.5–14.5)
WBC: 7.4 10*3/uL (ref 3.8–10.6)

## 2017-05-05 MED ORDER — VANCOMYCIN HCL IN DEXTROSE 1-5 GM/200ML-% IV SOLN
1000.0000 mg | Freq: Once | INTRAVENOUS | Status: AC
  Administered 2017-05-06: 1000 mg via INTRAVENOUS
  Filled 2017-05-05: qty 200

## 2017-05-05 NOTE — Telephone Encounter (Signed)
Noted. Thanks.

## 2017-05-05 NOTE — Telephone Encounter (Signed)
[name] RN is going to see him in his apartment at [facility] tomorrow for hospital follow up--that can actually count as the TCM visit

## 2017-05-05 NOTE — H&P (Signed)
[facility] [place] NAME: Benjamin Villarreal    MR#:  [id number]  DATE OF BIRTH:  Jun 02, 1920  DATE OF ADMISSION:  05/05/2017  PRIMARY CARE PHYSICIAN: Venia Carbon, MD   REQUESTING/REFERRING PHYSICIAN: Beather Arbour, MD  CHIEF COMPLAINT:   Chief Complaint  Patient presents with  . Cellulitis    HISTORY OF PRESENT ILLNESS:  Benjamin Villarreal  is a 84 y.o. male who presents with progressive cellulitis.  Patient was just admitted to our hospital and was treated with IV vancomycin with improvement in his cellulitis.  He was discharged on p.o. Bactrim.  He returns to our hospital 1 day after discharge with progression of his cellulitis again.  Hospitalist were called for admission.  PAST MEDICAL HISTORY:   Past Medical History:  Diagnosis Date  . Aortic atherosclerosis ([place])    Noted on CT  . Arthritis   . Colon polyps    adenomatous  . History of fracture of tibia   . History of tachycardia    patient unaware of this  . Kyphosis of cervicothoracic region    SEVERE  . Osteoporosis 01/2014   T -3.8 (01/2014)  . Other constipation    chronic  . Prostate cancer ([place])    surgery then lupron  . Unspecified venous (peripheral) insufficiency   . Vitamin B12 deficiency      PAST SURGICAL HISTORY:   Past Surgical History:  Procedure Laterality Date  . BASAL CELL CARCINOMA EXCISION  2004   Left side of face  . CATARACT EXTRACTION Bilateral 2000, 2003   both eyes  . COLONOSCOPY     h/o adenomatous polyps  . CYSTOSCOPY W/ RETROGRADES Bilateral 11/19/2016   Procedure: CYSTOSCOPY WITH RETROGRADE PYELOGRAM;  Surgeon: Hollice Espy, MD;  Location: [facility];  Service: Urology;  Laterality: Bilateral;  General vs. Spinal  . CYSTOSCOPY W/ URETERAL STENT PLACEMENT Left 02/25/2017   Procedure: CYSTOSCOPY WITH STENT REPLACEMENT;  Surgeon: Hollice Espy, MD;  Location: [facility];  Service: Urology;  Laterality: Left;  . CYSTOSCOPY WITH URETEROSCOPY  AND STENT PLACEMENT Left 11/19/2016   Procedure: CYSTOSCOPY WITH URETEROSCOPY AND STENT PLACEMENT;  Surgeon: Hollice Espy, MD;  Location: [facility];  Service: Urology;  Laterality: Left;  . FRACTURE SURGERY    . JOINT REPLACEMENT  2005   Left knee  . JOINT REPLACEMENT  12/13   Right knee  . PROSTATE SURGERY  1996   cancer  . sigmoid resection    . TIBIA FRACTURE SURGERY  1999  . URETERAL BIOPSY Left 11/19/2016   Procedure: URETERAL BIOPSY;  Surgeon: Hollice Espy, MD;  Location: [facility];  Service: Urology;  Laterality: Left;  Marland Kitchen VOLVULUS REDUCTION  12/10     SOCIAL HISTORY:   Social History   Tobacco Use  . Smoking status: Never Smoker  . Smokeless tobacco: Never Used  Substance Use Topics  . Alcohol use: No    Alcohol/week: 0.0 - 0.6 oz    Frequency: Never    Comment: in past     FAMILY HISTORY:   Family History  Problem Relation Age of Onset  . Leukemia Mother   . Diabetes Son   . Prostate cancer Neg Hx   . Bladder Cancer Neg Hx   . Kidney cancer Neg Hx      DRUG ALLERGIES:   Allergies  Allergen Reactions  . Other     RAW ONIONS- SINUS REACTION    MEDICATIONS AT HOME:   Prior to  Admission medications   Medication Sig Start Date End Date Taking? Authorizing Provider  acetaminophen (TYLENOL) 325 MG tablet Take 325-650 mg by mouth every 4 (four) hours as needed.    [provider]  alum & mag hydroxide-simeth (MAALOX/MYLANTA) 200-200-20 MG/5ML suspension Take 30 mLs by mouth every 4 (four) hours as needed for indigestion or heartburn.    [provider]  aspirin EC 81 MG tablet Take 81 mg by mouth every other day.    [provider]  Biotin 2.5 MG CAPS Take 2.5 mg by mouth daily.     [provider]  bismuth subsalicylate (KAOPECTATE) 262 MG/15ML suspension Take 10 mLs by mouth 3 (three) times daily as needed.    [provider]  Calcium Carbonate-Vitamin D (CALCIUM-D) 600-400 MG-UNIT TABS Take 1 tablet by  mouth daily.     [provider]  carbamide peroxide (DEBROX) 6.5 % OTIC solution 5 drops daily as needed.    [provider]  cholecalciferol (VITAMIN D) 1000 UNITS tablet Take 1,000 Units by mouth 2 (two) times daily.     [provider]  cyclobenzaprine (FLEXERIL) 5 MG tablet Take 5 mg by mouth at bedtime as needed for muscle spasms.    [provider]  diphenhydrAMINE (BENADRYL) 25 mg capsule Take 25 mg by mouth every 4 (four) hours as needed for itching or allergies.    [provider]  ferrous sulfate 325 (65 FE) MG tablet Take 1 tablet (325 mg total) by mouth 2 (two) times daily with a meal. 05/04/17   Gouru, Aruna, MD  fluticasone (FLONASE) 50 MCG/ACT nasal spray Place 1 spray into both nostrils daily.    [provider]  ibuprofen (ADVIL,MOTRIN) 200 MG tablet Take 400 mg by mouth 2 (two) times daily as needed for headache or mild pain.     [provider]  latanoprost (XALATAN) 0.005 % ophthalmic solution Place 1 drop into both eyes at bedtime.    [provider]  leuprolide (LUPRON) 30 MG injection Inject 30 mg into the muscle every 4 (four) months. Dose and interval not clear yet    [provider]  loratadine (CLARITIN) 10 MG tablet Take 20 mg by mouth at bedtime.    [provider]  magnesium hydroxide (MILK OF MAGNESIA) 400 MG/5ML suspension Take 30 mLs by mouth daily as needed for mild constipation.    [provider]  mupirocin ointment (BACTROBAN) 2 % Place 1 application into the nose 2 (two) times daily. 05/04/17   Gouru, Illene Silver, MD  nystatin (NYSTATIN) powder Apply topically 2 (two) times daily as needed. Use 1 application twice daily as needed for rash    [provider]  polyethylene glycol powder (GLYCOLAX/MIRALAX) powder FILL TO LINE (17 GRAMS ), MIX AND DRINK TWICE A DAY AND 8.5 GRAMS AT NOON 09/30/16   Viviana Simpler I, MD  sennosides-docusate sodium (SENOKOT-S) 8.6-50 MG  tablet Take 4 tablets by mouth every evening.     [provider]  sodium chloride (OCEAN) 0.65 % SOLN nasal spray Place 1-2 sprays into both nostrils 2 (two) times daily as needed for congestion.    [provider]  sulfamethoxazole-trimethoprim (BACTRIM DS,SEPTRA DS) 800-160 MG tablet Take 1 tablet by mouth 2 (two) times daily. 05/04/17   Gouru, Illene Silver, MD  traMADol (ULTRAM) 50 MG tablet Take 1 tablet (50 mg total) by mouth every 6 (six) hours as needed for moderate pain. 05/04/17   Nicholes Mango, MD  triamcinolone cream (  KENALOG) 0.1 % Apply 1 application topically daily as needed.     [provider]  vitamin B-12 (CYANOCOBALAMIN) 1000 MCG tablet Take 1,000 mcg by mouth daily.    [provider]  vitamin C (ASCORBIC ACID) 500 MG tablet Take 500 mg by mouth daily.    [provider]  XTANDI 40 MG capsule TAKE 3 CAPSULES (120 MG TOTAL) BY MOUTH DAILY. 04/30/17   Cammie Sickle, MD  zoledronic acid (RECLAST) 5 MG/100ML SOLN injection Inject 5 mg into the vein every 6 (six) months.     [provider]    REVIEW OF SYSTEMS:  Review of Systems  Constitutional: Negative for chills, fever, malaise/fatigue and weight loss.  HENT: Negative for ear pain, hearing loss and tinnitus.   Eyes: Negative for blurred vision, double vision, pain and redness.  Respiratory: Negative for cough, hemoptysis and shortness of breath.   Cardiovascular: Negative for chest pain, palpitations, orthopnea and leg swelling.  Gastrointestinal: Negative for abdominal pain, constipation, diarrhea, nausea and vomiting.  Genitourinary: Negative for dysuria, frequency and hematuria.  Musculoskeletal: Negative for back pain, joint pain and neck pain.  Skin:       Right lower extremity erythema, warmth and tenderness.  Neurological: Negative for dizziness, tremors, focal weakness and weakness.  Endo/Heme/Allergies: Negative for polydipsia. Does not bruise/bleed easily.   Psychiatric/Behavioral: Negative for depression. The patient is not nervous/anxious and does not have insomnia.      VITAL SIGNS:   Vitals:   05/05/17 2221 05/05/17 2222  BP: (!) 143/76   Pulse: 65   Resp: 20   Temp: 98 F (36.7 C)   TempSrc: Oral   SpO2: 98%   Weight:  81.6 kg (180 lb)  Height:  6\' 2"  (1.88 m)   Wt Readings from Last 3 Encounters:  05/05/17 81.6 kg (180 lb)  05/01/17 83.9 kg (185 lb)  04/20/17 84.4 kg (186 lb)    PHYSICAL EXAMINATION:  Physical Exam  Vitals reviewed. Constitutional: He is oriented to person, place, and time. He appears well-developed and well-nourished. No distress.  HENT:  Head: Normocephalic and atraumatic.  Mouth/Throat: Oropharynx is clear and moist.  Eyes: Conjunctivae and EOM are normal. Pupils are equal, round, and reactive to light. No scleral icterus.  Neck: Normal range of motion. Neck supple. No JVD present. No thyromegaly present.  Cardiovascular: Normal rate, regular rhythm and intact distal pulses. Exam reveals no gallop and no friction rub.  No murmur heard. Respiratory: Effort normal and breath sounds normal. No respiratory distress. He has no wheezes. He has no rales.  GI: Soft. Bowel sounds are normal. He exhibits no distension. There is no tenderness.  Musculoskeletal: Normal range of motion. He exhibits no edema.  No arthritis, no gout  Lymphadenopathy:    He has no cervical adenopathy.  Neurological: He is alert and oriented to person, place, and time. No cranial nerve deficit.  No dysarthria, no aphasia  Skin: Skin is warm and dry. No rash noted. There is erythema (Right lower extremity, with warmth and tenderness.).  Psychiatric: He has a normal mood and affect. His behavior is normal. Judgment and thought content normal.    LABORATORY PANEL:   CBC Recent Labs  Lab 05/05/17 2226  WBC 7.4  HGB 12.6*  HCT 37.6*  PLT 160    ------------------------------------------------------------------------------------------------------------------  Chemistries  Recent Labs  Lab 05/05/17 2226  NA 134*  K 4.3  CL 103  CO2 21*  GLUCOSE 104*  BUN 23*  CREATININE 1.19  CALCIUM 9.3  AST 27  ALT 16*  ALKPHOS 55  BILITOT 0.8   ------------------------------------------------------------------------------------------------------------------  Cardiac Enzymes No results for input(s): TROPONINI in the last 168 hours. ------------------------------------------------------------------------------------------------------------------  RADIOLOGY:  No results found.  EKG:   Orders placed or performed during the hospital encounter of 11/11/16  . EKG 12-Lead  . EKG 12-Lead    IMPRESSION AND PLAN:  Principal Problem:   Cellulitis -strongly suspect this patient likely has a partially resistant MRSA strain causing a cellulitis.  His MRSA PCR screen was positive here on last admission.  Most likely he has a strain that is Bactrim resistant, which would explain his rapid recurrence once discharged on that medication.  We will have him on vancomycin while he is here again, and we will get an ID consult for any further recommendation. Active Problems:   Chronic venous insufficiency -this is likely partially responsible for his predisposition to this cellulitis.  Continue home medications   Aortic atherosclerosis ([place]) - continue home medications    Prostate cancer [facility]) -patient is followed by urology and receives Lupron injections.  He can continue to follow with his urologist discharge.   CONSTIPATION, CHRONIC - continue home bowel regimen  Chart review performed and case discussed with ED provider. Labs, imaging and/or ECG reviewed by provider and discussed with patient/family. Management plans discussed with the patient and/or family.  DVT PROPHYLAXIS: SubQ lovenox  GI PROPHYLAXIS: None  ADMISSION STATUS:  Observation  CODE STATUS: Full Code Status History    Date Active Date Inactive Code Status Order ID Comments User Context   05/01/2017 18:40 05/04/2017 19:49 Full Code [id number]  Nicholes Mango, MD Inpatient    Advance Directive Documentation     Most Recent Value  Type of Advance Directive  Healthcare Power of Attorney, Living will  Pre-existing out of facility DNR order (yellow form or pink MOST form)  No data  "MOST" Form in Place?  No data      TOTAL TIME TAKING CARE OF THIS PATIENT: 40 minutes.   Jurgen Groeneveld [place] 05/05/2017, 11:57 PM  [facility]  Office  [phone]  CC: Primary care physician; Venia Carbon, MD  Note:  This document was prepared using Dragon voice recognition software and may include unintentional dictation errors.

## 2017-05-05 NOTE — Telephone Encounter (Signed)
Transition Care Management Follow-up Telephone Call   Date discharged? 05/04/17   How have you been since you were released from the hospital? Patient stated, "I'm doing fine, I slept better & taking my medications on my own".   Do you understand why you were in the hospital? yes   Do you understand the discharge instructions? yes   Where were you discharged to? [place]   Items Reviewed:  Medications reviewed: Patient voiced that he's not sure of which medications he takes specifically, but has a nurse to help him out at the assisted living facility if necessary.  Allergies reviewed: yes  Dietary changes reviewed: yes  Referrals reviewed: yes   Functional Questionnaire:   Activities of Daily Living (ADLs):   He states they are independent in the following: ambulation, bathing and hygiene, feeding, continence, grooming, toileting and dressing States they require assistance with the following: None   Any transportation issues/concerns?: yes, patient voiced that he's transported by his assisted living facility & requests that he has an afternoon appointment with PCP.   Any patient concerns? yes, he's concerned about the infection on his leg & hope that it heals well.   Confirmed importance and date/time of follow-up visits scheduled no, RN spoke with Lanae Boast from [facility] to help assist with getting the patient scheduled for hospital follow-up visit; informed him that the patient's preference was to be scheduled for 05/14/17 with PCP, however did not see any available appointments at this time.  Confirmed with patient if condition begins to worsen call PCP or go to the ER.  Patient was given the office number and encouraged to call back with question or concerns.  : yes

## 2017-05-05 NOTE — ED Provider Notes (Signed)
[facility] Emergency Department Provider Note   ____________________________________________   First MD Initiated Contact with Patient 05/05/17 2314     (approximate)  I have reviewed the triage vital signs and the nursing notes.   HISTORY  Chief Complaint Cellulitis    HPI Benjamin Villarreal is a 84 y.o. male sent to the ED from nursing facility with a chief complaint of recurrent cellulitis.  Patient was hospitalized from 3/8-3/11 on IV vancomycin for left lower leg cellulitis after failing outpatient antibiotics (Ancef and clindamycin).  He was on IV vancomycin while in the hospital and discharged to nursing facility yesterday.  Patient returns for recurrent redness, warmth and pain in the left leg.  Denies fever, chills, chest pain, shortness of breath, abdominal pain, nausea, vomiting.  Denies recent travel or trauma.   Past Medical History:  Diagnosis Date  . Aortic atherosclerosis ([place])    Noted on CT  . Arthritis   . Colon polyps    adenomatous  . History of fracture of tibia   . History of tachycardia    patient unaware of this  . Kyphosis of cervicothoracic region    SEVERE  . Osteoporosis 01/2014   T -3.8 (01/2014)  . Other constipation    chronic  . Prostate cancer ([place])    surgery then lupron  . Unspecified venous (peripheral) insufficiency   . Vitamin B12 deficiency     Patient Active Problem List   Diagnosis Date Noted  . Aortic atherosclerosis ([place]) 10/09/2016  . Iron deficiency anemia due to chronic blood loss 10/05/2016  . Peripheral neuropathy 01/24/2016  . 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 Allergic rhinitis due to pollen 10/21/2012  . Prostate cancer ([place]) 03/22/2009  . Chronic venous insufficiency 09/07/2008  . CONSTIPATION, CHRONIC 09/07/2008  . Osteoarthritis, multiple sites 09/05/2008  . Osteoporosis 09/05/2008    Past Surgical History:  Procedure Laterality Date  . BASAL CELL CARCINOMA EXCISION  2004   Left side of face  . CATARACT EXTRACTION Bilateral 2000, 2003   both eyes  . COLONOSCOPY     h/o adenomatous polyps  . CYSTOSCOPY W/ RETROGRADES Bilateral 11/19/2016   Procedure: CYSTOSCOPY WITH RETROGRADE PYELOGRAM;  Surgeon: Hollice Espy, MD;  Location: [facility] ORS;  Service: Urology;  Laterality: Bilateral;  General vs. Spinal  . CYSTOSCOPY W/ URETERAL STENT PLACEMENT Left 02/25/2017   Procedure: CYSTOSCOPY WITH STENT REPLACEMENT;  Surgeon: Hollice Espy, MD;  Location: [facility] ORS;  Service: Urology;  Laterality: Left;  . CYSTOSCOPY WITH URETEROSCOPY AND STENT PLACEMENT Left 11/19/2016   Procedure: CYSTOSCOPY WITH URETEROSCOPY AND STENT PLACEMENT;  Surgeon: Hollice Espy, MD;  Location: [facility] ORS;  Service: Urology;  Laterality: Left;  . FRACTURE SURGERY    . JOINT REPLACEMENT  2005   Left knee  . JOINT REPLACEMENT  12/13   Right knee  . PROSTATE SURGERY  1996   cancer  . sigmoid resection    . TIBIA FRACTURE SURGERY  1999  . URETERAL BIOPSY Left 11/19/2016   Procedure: URETERAL BIOPSY;  Surgeon: Hollice Espy, MD;  Location: [facility] ORS;  Service: Urology;  Laterality: Left;  Marland Kitchen VOLVULUS REDUCTION  12/10    Prior to Admission medications   Medication Sig Start Date End Date Taking? Authorizing Provider  acetaminophen (TYLENOL) 325 MG tablet Take 325-650 mg by mouth every 4 (four) hours as needed.    [provider]  alum & mag hydroxide-simeth (MAALOX/MYLANTA) 200-200-20 MG/5ML suspension Take 30 mLs by mouth every 4 (four) hours  as needed for indigestion or heartburn.    [provider]  aspirin EC 81 MG tablet Take 81 mg by mouth every other day.    [provider]  Biotin 2.5 MG CAPS Take 2.5 mg by mouth daily.     [provider]  bismuth subsalicylate (KAOPECTATE) 262 MG/15ML suspension Take 10 mLs by mouth 3 (three) times daily as needed.    [provider]  Calcium Carbonate-Vitamin D (CALCIUM-D) 600-400 MG-UNIT TABS Take 1 tablet by  mouth daily.     [provider]  carbamide peroxide (DEBROX) 6.5 % OTIC solution 5 drops daily as needed.    [provider]  cholecalciferol (VITAMIN D) 1000 UNITS tablet Take 1,000 Units by mouth 2 (two) times daily.     [provider]  cyclobenzaprine (FLEXERIL) 5 MG tablet Take 5 mg by mouth at bedtime as needed for muscle spasms.    [provider]  diphenhydrAMINE (BENADRYL) 25 mg capsule Take 25 mg by mouth every 4 (four) hours as needed for itching or allergies.    [provider]  ferrous sulfate 325 (65 FE) MG tablet Take 1 tablet (325 mg total) by mouth 2 (two) times daily with a meal. 05/04/17   Gouru, Aruna, MD  fluticasone (FLONASE) 50 MCG/ACT nasal spray Place 1 spray into both nostrils daily.    [provider]  ibuprofen (ADVIL,MOTRIN) 200 MG tablet Take 400 mg by mouth 2 (two) times daily as needed for headache or mild pain.     [provider]  latanoprost (XALATAN) 0.005 % ophthalmic solution Place 1 drop into both eyes at bedtime.    [provider]  leuprolide (LUPRON) 30 MG injection Inject 30 mg into the muscle every 4 (four) months. Dose and interval not clear yet    [provider]  loratadine (CLARITIN) 10 MG tablet Take 20 mg by mouth at bedtime.    [provider]  magnesium hydroxide (MILK OF MAGNESIA) 400 MG/5ML suspension Take 30 mLs by mouth daily as needed for mild constipation.    [provider]  mupirocin ointment (BACTROBAN) 2 % Place 1 application into the nose 2 (two) times daily. 05/04/17   Gouru, Illene Silver, MD  nystatin (NYSTATIN) powder Apply topically 2 (two) times daily as needed. Use 1 application twice daily as needed for rash    [provider]  polyethylene glycol powder (GLYCOLAX/MIRALAX) powder FILL TO LINE (17 GRAMS ), MIX AND DRINK TWICE A DAY AND 8.5 GRAMS AT NOON 09/30/16   Viviana Simpler I, MD  sennosides-docusate sodium (SENOKOT-S) 8.6-50 MG  tablet Take 4 tablets by mouth every evening.     [provider]  sodium chloride (OCEAN) 0.65 % SOLN nasal spray Place 1-2 sprays into both nostrils 2 (two) times daily as needed for congestion.    [provider]  sulfamethoxazole-trimethoprim (BACTRIM DS,SEPTRA DS) 800-160 MG tablet Take 1 tablet by mouth 2 (two) times daily. 05/04/17   Gouru, Illene Silver, MD  traMADol (ULTRAM) 50 MG tablet Take 1 tablet (50 mg total) by mouth every 6 (six) hours as needed for moderate pain. 05/04/17   Gouru, Illene Silver, MD  triamcinolone cream (KENALOG) 0.1 % Apply 1 application topically daily as needed.     [provider]  vitamin B-12 (CYANOCOBALAMIN) 1000 MCG tablet Take 1,000 mcg by mouth daily.    [provider]  vitamin C (ASCORBIC ACID) 500 MG tablet Take 500 mg by mouth daily.    [provider]  XTANDI 40 MG capsule TAKE 3 CAPSULES (120 MG TOTAL) BY MOUTH DAILY. 04/30/17   Cammie Sickle, MD  zoledronic acid (RECLAST) 5 MG/100ML SOLN injection Inject 5 mg into the vein every 6 (six) months.     [provider]    Allergies Other  Family History  Problem Relation Age of Onset  . Leukemia Mother   . Diabetes Son   . Prostate cancer Neg Hx   . Bladder Cancer Neg Hx   . Kidney cancer Neg Hx     Social History Social History   Tobacco Use  . Smoking status: Never Smoker  . Smokeless tobacco: Never Used  Substance Use Topics  . Alcohol use: No    Alcohol/week: 0.0 - 0.6 oz    Frequency: Never    Comment: in past  . Drug use: No    Review of Systems  Constitutional: No fever/chills. Eyes: No visual changes. ENT: No sore throat. Cardiovascular: Denies chest pain. Respiratory: Denies shortness of breath. Gastrointestinal: No abdominal pain.  No nausea, no vomiting.  No diarrhea.  No constipation. Genitourinary: Negative for dysuria. Musculoskeletal: Left lower leg redness, warmth, pain and swelling.  Negative for back pain. Skin:  Negative for rash. Neurological: Negative for headaches, focal weakness or numbness.   ____________________________________________   PHYSICAL EXAM:  VITAL SIGNS: ED Triage Vitals  Enc Vitals Group     BP 05/05/17 2221 (!) 143/76     Pulse Rate 05/05/17 2221 65     Resp 05/05/17 2221 20     Temp 05/05/17 2221 98 F (36.7 C)     Temp Source 05/05/17 2221 Oral     SpO2 05/05/17 2221 98 %     Weight 05/05/17 2222 180 lb (81.6 kg)     Height 05/05/17 2222 6\' 2"  (1.88 m)     Head Circumference --      Peak Flow --      Pain Score 05/05/17 2222 0     Pain Loc --      Pain Edu? --      Excl. in [place]? --     Constitutional: Alert and oriented. Well appearing and in no acute distress. Eyes: Conjunctivae are normal. PERRL. EOMI. Head: Atraumatic. Nose: No congestion/rhinnorhea. Mouth/Throat: Mucous membranes are moist.  Oropharynx non-erythematous. Neck: No stridor.   Cardiovascular: Normal rate, regular rhythm. Grossly normal heart sounds.  Good peripheral circulation. Respiratory: Normal respiratory effort.  No retractions. Lungs CTAB. Gastrointestinal: Soft and nontender. No distention. No abdominal bruits. No CVA tenderness. Musculoskeletal: Left lower leg redness, warmth and swelling.  Palpable distal pulses. Neurologic:  Normal speech and language. No gross focal neurologic deficits are appreciated. No gait instability. Skin:  Skin is warm, dry and intact. No rash noted. Psychiatric: Mood and affect are normal. Speech and behavior are normal.  ____________________________________________   LABS (all labs ordered are listed, but only abnormal results are displayed)  Labs Reviewed  CBC WITH DIFFERENTIAL/PLATELET - Abnormal; Notable for the following components:      Result Value   RBC 4.11 (*)    Hemoglobin 12.6 (*)    HCT 37.6 (*)    All other components within normal limits  COMPREHENSIVE METABOLIC PANEL - Abnormal; Notable for the following components:   Sodium 134  (*)    CO2 21 (*)    Glucose, Bld 104 (*)    BUN 23 (*)    ALT 16 (*)    GFR calc non Af  Amer 50 (*)    GFR calc Af Amer 58 (*)    All other components within normal limits  CULTURE, BLOOD (ROUTINE X 2)  CULTURE, BLOOD (ROUTINE X 2)  LACTIC ACID, PLASMA  LACTIC ACID, PLASMA   ____________________________________________  EKG  None ____________________________________________  RADIOLOGY  ED MD interpretation: None  Official radiology report(s): No results found.  ____________________________________________   PROCEDURES  Procedure(s) performed: None  Procedures  Critical Care performed: No  ____________________________________________   INITIAL IMPRESSION / ASSESSMENT AND PLAN / ED COURSE  As part of my medical decision making, I reviewed the following data within the [place] notes reviewed and incorporated, Labs reviewed, Old chart reviewed, Discussed with admitting physician and Notes from prior ED visits   84 year old male who returns to the ED status post hospitalization for left lower leg cellulitis.  Review of chart shows patient was positive for MRSA by PCR; blood cultures were negative.  Doppler ultrasound on 3/9- for DVT.  He was originally on Ancef and clindamycin.  In the hospital patient was placed on IV vancomycin.  He was discharged on Bactrim with recurrence of warmth, redness, pain and swelling to the left leg.  Will obtain blood cultures, lactate, CBC although I expect the white count will be normal.  Will discuss with hospitalist to evaluate patient in the emergency department for readmission for recurrent cellulitis.  There is really nothing to culture as patient does not have an active break in his skin.  He may benefit from infectious disease consultation while in the hospital.      ____________________________________________   FINAL CLINICAL IMPRESSION(S) / ED DIAGNOSES  Final diagnoses:  Cellulitis of left  lower extremity     ED Discharge Orders    None       Note:  This document was prepared using Dragon voice recognition software and may include unintentional dictation errors.    Paulette Blanch, MD 05/06/17 [phone]

## 2017-05-05 NOTE — ED Triage Notes (Signed)
Pt arrives via [facility] from [facility] with complaints of worsening redness in lower left extremity. Pt was in the hospital 4 days on IV vancomycin. Pt was discharged yesterday and redness returned today. Progressively got worse until facility called to have pt transported to ER. Pt currently denying any pain in left leg, but states pain is there when he walks on it.

## 2017-05-05 NOTE — Telephone Encounter (Signed)
See below crm  This pt is a  [facility] pt.  Will dr Silvio Pate see him there  Copied from [place]. Topic: Appointment Scheduling - Scheduling Inquiry for Clinic >> May 05, 2017 12:10 PM Oliver Pila B wrote: Reason for CRM: pt is needing a hos f/u, pt states that the best day for him is on the 3.21.19 in the afternoon, I know [place] works fast but contact pt on when he can be fit in on the schedule for a hos f/u

## 2017-05-06 DIAGNOSIS — I872 Venous insufficiency (chronic) (peripheral): Secondary | ICD-10-CM | POA: Diagnosis present

## 2017-05-06 DIAGNOSIS — Z91018 Allergy to other foods: Secondary | ICD-10-CM | POA: Diagnosis not present

## 2017-05-06 DIAGNOSIS — B955 Unspecified streptococcus as the cause of diseases classified elsewhere: Secondary | ICD-10-CM | POA: Diagnosis present

## 2017-05-06 DIAGNOSIS — Z7982 Long term (current) use of aspirin: Secondary | ICD-10-CM | POA: Diagnosis not present

## 2017-05-06 DIAGNOSIS — L0390 Cellulitis, unspecified: Secondary | ICD-10-CM | POA: Diagnosis not present

## 2017-05-06 DIAGNOSIS — L03116 Cellulitis of left lower limb: Secondary | ICD-10-CM | POA: Diagnosis not present

## 2017-05-06 DIAGNOSIS — Z96653 Presence of artificial knee joint, bilateral: Secondary | ICD-10-CM | POA: Diagnosis present

## 2017-05-06 DIAGNOSIS — M810 Age-related osteoporosis without current pathological fracture: Secondary | ICD-10-CM | POA: Diagnosis present

## 2017-05-06 DIAGNOSIS — C61 Malignant neoplasm of prostate: Secondary | ICD-10-CM | POA: Diagnosis not present

## 2017-05-06 DIAGNOSIS — Z79899 Other long term (current) drug therapy: Secondary | ICD-10-CM | POA: Diagnosis not present

## 2017-05-06 DIAGNOSIS — Z85828 Personal history of other malignant neoplasm of skin: Secondary | ICD-10-CM | POA: Diagnosis not present

## 2017-05-06 DIAGNOSIS — I700 Atherosclerosis of aorta: Secondary | ICD-10-CM | POA: Diagnosis present

## 2017-05-06 DIAGNOSIS — Z8546 Personal history of malignant neoplasm of prostate: Secondary | ICD-10-CM | POA: Diagnosis not present

## 2017-05-06 DIAGNOSIS — K5909 Other constipation: Secondary | ICD-10-CM | POA: Diagnosis present

## 2017-05-06 LAB — LACTIC ACID, PLASMA
LACTIC ACID, VENOUS: 2.2 mmol/L — AB (ref 0.5–1.9)
LACTIC ACID, VENOUS: 1.8 mmol/L (ref 0.5–1.9)
Lactic Acid, Venous: 1.5 mmol/L (ref 0.5–1.9)
Lactic Acid, Venous: 1.9 mmol/L (ref 0.5–1.9)

## 2017-05-06 LAB — BASIC METABOLIC PANEL
Anion gap: 10 (ref 5–15)
BUN: 21 mg/dL — AB (ref 6–20)
CHLORIDE: 105 mmol/L (ref 101–111)
CO2: 19 mmol/L — ABNORMAL LOW (ref 22–32)
Calcium: 8.7 mg/dL — ABNORMAL LOW (ref 8.9–10.3)
Creatinine, Ser: 1.11 mg/dL (ref 0.61–1.24)
GFR calc Af Amer: >60 mL/min (ref >60)
GFR calc non Af Amer: 55 mL/min — ABNORMAL LOW (ref >60)
GLUCOSE: 123 mg/dL — AB (ref 65–99)
POTASSIUM: 4.1 mmol/L (ref 3.5–5.1)
SODIUM: 134 mmol/L — AB (ref 135–145)

## 2017-05-06 LAB — CBC
HCT: 35.1 % — ABNORMAL LOW (ref 40.0–52.0)
Hemoglobin: 11.9 g/dL — ABNORMAL LOW (ref 13.0–18.0)
MCH: 30.9 pg (ref 26.0–34.0)
MCHC: 33.8 g/dL (ref 32.0–36.0)
MCV: 91.3 fL (ref 80.0–100.0)
PLATELETS: 139 10*3/uL — AB (ref 150–440)
RBC: 3.84 MIL/uL — AB (ref 4.40–5.90)
RDW: 13.8 % (ref 11.5–14.5)
WBC: 6.3 10*3/uL (ref 3.8–10.6)

## 2017-05-06 LAB — CREATININE, SERUM
CREATININE: 1.1 mg/dL (ref 0.61–1.24)
GFR calc non Af Amer: 55 mL/min — ABNORMAL LOW (ref >60)

## 2017-05-06 MED ORDER — SODIUM CHLORIDE 0.9 % IV BOLUS (SEPSIS)
1000.0000 mL | INTRAVENOUS | Status: DC | PRN
  Administered 2017-05-06: 1000 mL via INTRAVENOUS

## 2017-05-06 MED ORDER — ENOXAPARIN SODIUM 40 MG/0.4ML ~~LOC~~ SOLN
40.0000 mg | SUBCUTANEOUS | Status: DC
  Administered 2017-05-06 – 2017-05-07 (×2): 40 mg via SUBCUTANEOUS
  Filled 2017-05-06 (×2): qty 0.4

## 2017-05-06 MED ORDER — POLYETHYLENE GLYCOL 3350 17 G PO PACK
8.5000 g | PACK | Freq: Every day | ORAL | Status: DC
  Administered 2017-05-06: 8.5 g via ORAL
  Filled 2017-05-06: qty 1

## 2017-05-06 MED ORDER — ONDANSETRON HCL 4 MG PO TABS: 4.0000 mg | ORAL_TABLET | Freq: Four times a day (QID) | ORAL | Status: DC | PRN

## 2017-05-06 MED ORDER — ACETAMINOPHEN 650 MG RE SUPP: 650.0000 mg | Freq: Four times a day (QID) | RECTAL | Status: DC | PRN

## 2017-05-06 MED ORDER — ACETAMINOPHEN 325 MG PO TABS: 650.0000 mg | ORAL_TABLET | Freq: Four times a day (QID) | ORAL | Status: DC | PRN

## 2017-05-06 MED ORDER — CYCLOBENZAPRINE HCL 10 MG PO TABS: 5.0000 mg | ORAL_TABLET | Freq: Every evening | ORAL | Status: DC | PRN

## 2017-05-06 MED ORDER — ASPIRIN EC 81 MG PO TBEC
81.0000 mg | DELAYED_RELEASE_TABLET | ORAL | Status: DC
  Administered 2017-05-06: 81 mg via ORAL
  Filled 2017-05-06: qty 1

## 2017-05-06 MED ORDER — LATANOPROST 0.005 % OP SOLN
1.0000 [drp] | Freq: Every day | OPHTHALMIC | Status: DC
  Administered 2017-05-06: 1 [drp] via OPHTHALMIC
  Filled 2017-05-06: qty 2.5

## 2017-05-06 MED ORDER — SENNOSIDES-DOCUSATE SODIUM 8.6-50 MG PO TABS
4.0000 | ORAL_TABLET | Freq: Every evening | ORAL | Status: DC
  Administered 2017-05-06: 4 via ORAL
  Filled 2017-05-06: qty 4

## 2017-05-06 MED ORDER — SODIUM CHLORIDE 0.9 % IV SOLN
INTRAVENOUS | Status: AC
  Administered 2017-05-06: 125 mL/h via INTRAVENOUS
  Administered 2017-05-06 (×2): via INTRAVENOUS

## 2017-05-06 MED ORDER — OXYCODONE HCL 5 MG PO TABS: 5.0000 mg | ORAL_TABLET | ORAL | Status: DC | PRN

## 2017-05-06 MED ORDER — ONDANSETRON HCL 4 MG/2ML IJ SOLN: 4.0000 mg | Freq: Four times a day (QID) | INTRAMUSCULAR | Status: DC | PRN

## 2017-05-06 MED ORDER — DIPHENHYDRAMINE HCL 25 MG PO CAPS: 25.0000 mg | ORAL_CAPSULE | ORAL | Status: DC | PRN

## 2017-05-06 MED ORDER — VANCOMYCIN HCL 10 G IV SOLR
1500.0000 mg | INTRAVENOUS | Status: DC
  Administered 2017-05-06 – 2017-05-07 (×2): 1500 mg via INTRAVENOUS
  Filled 2017-05-06 (×2): qty 1500

## 2017-05-06 MED ORDER — MAGNESIUM HYDROXIDE 400 MG/5ML PO SUSP
30.0000 mL | Freq: Every day | ORAL | Status: DC | PRN
  Administered 2017-05-06 – 2017-05-07 (×2): 30 mL via ORAL
  Filled 2017-05-06 (×2): qty 30

## 2017-05-06 NOTE — Progress Notes (Signed)
Pharmacy Antibiotic Note  Benjamin Villarreal is a 84 y.o. male admitted on 05/05/2017 with cellulitis.  Pharmacy has been consulted for vancomycin dosing.  Plan: Patient received vanc 1g IV x 1 in ED  Will continue vanc 1.5g IV q24h w/ 6 hour stack dose Will draw vanc trough 03/16 @ 0500 prior to 4th dose.  Ke 0.0407 T1/2 17 ~ 24 hrs and increasing dose to 1.5g  Goal trough 15 - 20 mcg/mL  Height: 6\' 2"  (188 cm) Weight: 180 lb (81.6 kg) IBW/kg (Calculated) : 82.2  Temp (24hrs), Avg:98 F (36.7 C), Min:98 F (36.7 C), Max:98 F (36.7 C)  Recent Labs  Lab 05/01/17 1458 05/01/17 1721 05/02/17 0524 05/02/17 1557 05/02/17 1749 05/03/17 1130 05/04/17 0425 05/05/17 2226 05/06/17 0000  WBC 6.8  --  5.5  --   --   --   --  7.4  --   CREATININE 1.13  --  1.08  --   --   --  0.90 1.19  --   LATICACIDVEN 2.1* 2.1*  --  1.7 1.5  --   --   --  1.9  VANCOTROUGH  --   --   --   --   --  15  --   --   --     Estimated Creatinine Clearance: 43.8 mL/min (by C-G formula based on SCr of 1.19 mg/dL).    Allergies  Allergen Reactions  . Other     RAW ONIONS- SINUS REACTION    Thank you for allowing pharmacy to be a part of this patient's care.  Tobie Lords, PharmD, BCPS Clinical Pharmacist 05/06/2017

## 2017-05-06 NOTE — Clinical Social Work Note (Signed)
Clinical Social Work Assessment  Patient Details  Name: Benjamin Villarreal MRN: [id number] Date of Birth: 1920/11/07  Date of referral:  05/06/17               Reason for consult:  Other (Comment Required)(From [facility] )                Permission sought to share information with:  Chartered certified accountant granted to share information::  Yes, Verbal Permission Granted  Name::      [facility]   Agency::     Relationship::     Contact Information:     Housing/Transportation Living arrangements for the past 2 months:  [place] of Information:  Patient, Facility Patient Interpreter Needed:  None Criminal Activity/Legal Involvement Pertinent to Current Situation/Hospitalization:  No - Comment as needed Significant Relationships:  Adult Children Lives with:  Facility Resident Do you feel safe going back to the place where you live?  Yes Need for family participation in patient care:  Yes (Comment)  Care giving concerns:  Patient is a resident at [facility].    Social Worker assessment / plan:  Holiday representative ([place]) reviewed chart and noted that patient is from [facility]. CSW left a Advertising account executive for [employer] [place]. Per Seth Bake admissions coordinator at [facility] patient is from the ALF side and discharged from [facility] on Monday 05/04/17. CSW met with patient alone at bedside to discuss D/C plan. Patient was alert and oriented X4 and was laying in the bed eating breakfast. CSW introduced self and explained role of CSW department. Patient reported that he is from [facility] and his daughter Benjamin Villarreal [phone] is his HPOA and lives in [place], [place]. Patient is agreeable to return to [facility]. CSW attempted to contact patient's daughter [name] however she did not answer. FL2 complete. CSW will continue to follow and assist as needed.   Employment status:  Retired Forensic scientist:  Medicare PT  Recommendations:  Not assessed at this time [place] / Referral to community resources:  Other (Comment Required)(Patient will retrun to [facility]. )  Patient/Family's Response to care:  Patient is agreeable to D/C to [facility].   Patient/Family's Understanding of and Emotional Response to Diagnosis, Current Treatment, and Prognosis:  Patient was very pleasant and thanked CSW for visit.   Emotional Assessment Appearance:  Appears stated age Attitude/Demeanor/Rapport:    Affect (typically observed):  Accepting, Adaptable, Pleasant Orientation:  Oriented to Self, Oriented to Place, Oriented to  Time, Oriented to Situation Alcohol / Substance use:  Not Applicable Psych involvement (Current and /or in the community):  No (Comment)  Discharge Needs  Concerns to be addressed:  Discharge Planning Concerns Readmission within the last 30 days:  Yes Current discharge risk:  Chronically ill Barriers to Discharge:  Continued Medical Work up   [employer], Veronia Beets, LCSW 05/06/2017, 10:59 AM

## 2017-05-06 NOTE — Consult Note (Signed)
[facility] Infectious Disease     Reason for Consult: LLE cellulitis    Referring Physician: Bettey Costa Date of Admission:  05/05/2017   Principal Problem:   Cellulitis Active Problems:   Prostate cancer ([place])   Chronic venous insufficiency   CONSTIPATION, CHRONIC   Aortic atherosclerosis (H[facility])   HPI: Benjamin Villarreal is a 84 y.o. male admitted with recurrent cellulitis in his LE. He was admitted 3/8-3/11 with LLE cellulitis and treated with IV vanco and dced on oral bactrim. During that admission had neg doppler, xray knee. Does have prosthetic L knee. MRSA PCR was +/  He also had recent L foot fx. Recurred rapidly. On admit wbc was 7, temp 98.  Currently the redness is receding from the line drawn at admission. Still mild pain and swelling.   Past Medical History:  Diagnosis Date  . Aortic atherosclerosis ([place])    Noted on CT  . Arthritis   . Colon polyps    adenomatous  . History of fracture of tibia   . History of tachycardia    patient unaware of this  . Kyphosis of cervicothoracic region    SEVERE  . Osteoporosis 01/2014   T -3.8 (01/2014)  . Other constipation    chronic  . Prostate cancer ([place])    surgery then lupron  . Unspecified venous (peripheral) insufficiency   . Vitamin B12 deficiency    Past Surgical History:  Procedure Laterality Date  . BASAL CELL CARCINOMA EXCISION  2004   Left side of face  . CATARACT EXTRACTION Bilateral 2000, 2003   both eyes  . COLONOSCOPY     h/o adenomatous polyps  . CYSTOSCOPY W/ RETROGRADES Bilateral 11/19/2016   Procedure: CYSTOSCOPY WITH RETROGRADE PYELOGRAM;  Surgeon: Hollice Espy, MD;  Location: [facility] OR[facility];  Service: Urology;  Laterality: Bilateral;  General vs. Spinal  . CYSTOSCOPY W/ URETERAL STENT PLACEMENT Left 02/25/2017   Procedure: CYSTOSCOPY WITH STENT REPLACEMENT;  Surgeon: Hollice Espy, MD;  Location: [facility];  Service: Urology;  Laterality: Left;  . CYSTOSCOPY WITH URETEROSCOPY AND STENT PLACEMENT Left  11/19/2016   Procedure: CYSTOSCOPY WITH URETEROSCOPY AND STENT PLACEMENT;  Surgeon: Hollice Espy, MD;  Location: [facility] OR[facility];  Service: Urology;  Laterality: Left;  . FRACTURE SURGERY    . JOINT REPLACEMENT  2005   Left knee  . JOINT REPLACEMENT  12/13   Right knee  . PROSTATE SURGERY  1996   cancer  . sigmoid resection    . TIBIA FRACTURE SURGERY  1999  . URETERAL BIOPSY Left 11/19/2016   Procedure: URETERAL BIOPSY;  Surgeon: Hollice Espy, MD;  Location: [facility] OR[facility];  Service: Urology;  Laterality: Left;  Marland Kitchen VOLVULUS REDUCTION  12/10   Social History   Tobacco Use  . Smoking status: Never Smoker  . Smokeless tobacco: Never Used  Substance Use Topics  . Alcohol use: No    Alcohol/week: 0.0 - 0.6 oz    Frequency: Never    Comment: in past  . Drug use: No   Family History  Problem Relation Age of Onset  . Leukemia Mother   . Diabetes Son   . Prostate cancer Neg Hx   . Bladder Cancer Neg Hx   . Kidney cancer Neg Hx     Allergies:  Allergies  Allergen Reactions  . Other     RAW ONIONS- SINUS REACTION    Current antibiotics: Antibiotics Given (last 72 hours)    Date/Time Action Medication Dose Rate   05/06/17 0023 New  Bag/Given   vancomycin (VANCOCIN) IVPB 1000 mg/200 mL premix 1,000 mg 200 mL/hr   05/06/17 0639 New Bag/Given   vancomycin (VANCOCIN) 1,500 mg in sodium chloride 0.9 % 500 mL IVPB 1,500 mg 250 mL/hr      MEDICATIONS: . aspirin EC  81 mg Oral QODAY  . enoxaparin (LOVENOX) injection  40 mg Subcutaneous Q24H  . latanoprost  1 drop Both Eyes QHS  . polyethylene glycol  8.5 g Oral Daily  . senna-docusate  4 tablet Oral QPM    Review of Systems - 11 systems reviewed and negative per HPI   OBJECTIVE: Temp:  [98 F (36.7 C)-98.5 F (36.9 C)] 98.1 F (36.7 C) (03/13 1004) Pulse Rate:  [63-73] 73 (03/13 1004) Resp:  [18-20] 18 (03/13 1004) BP: (116-148)/(44-82) 116/44 (03/13 1004) SpO2:  [94 %-98 %] 97 % (03/13 1004) Weight:  [79.2 kg (174 lb 9.7  oz)-81.6 kg (180 lb)] 79.2 kg (174 lb 9.7 oz) (03/13 0134) Physical Exam  Constitutional: He is oriented to person, place, and time.HOH, thin HENT: anicteric Mouth/Throat: Oropharynx is clear and moist. No oropharyngeal exudate.  Cardiovascular: Normal rate,  Pulmonary/Chest: Effort normal and breath sounds normal. No respiratory distress. He has no wheezes.  Abdominal: Soft. Bowel sounds are normal. He exhibits no distension. There is no tenderness.  Lymphadenopathy: He has no cervical adenopathy.  Neurological: He is alert and oriented to person, place, and time.  HOH LLE with 1+ edema to mid calf Skin: LLE with mod erythema to mid shin, some scaly skin but no wounds or ulcers Psychiatric: He has a normal mood and affect. His behavior is normal.     LABS: Results for orders placed or performed during the hospital encounter of 05/05/17 (from the past 48 hour(s))  Blood culture (routine x 2)     Status: None (Preliminary result)   Collection Time: 05/05/17 10:00 PM  Result Value Ref Range   Specimen Description BLOOD RIGHT HAND    Special Requests      BOTTLES DRAWN AEROBIC AND ANAEROBIC Blood Culture adequate volume   Culture      NO GROWTH < 12 HOURS Performed at [facility], [address] [place], [place] [address]    Report Status PENDING   CBC with Differential     Status: Abnormal   Collection Time: 05/05/17 10:26 PM  Result Value Ref Range   WBC 7.4 3.8 - 10.6 K/uL   RBC 4.11 (L) 4.40 - 5.90 MIL/uL   Hemoglobin 12.6 (L) 13.0 - 18.0 g/dL   HCT 37.6 (L) 40.0 - 52.0 %   MCV 91.6 80.0 - 100.0 fL   MCH 30.8 26.0 - 34.0 pg   MCHC 33.6 32.0 - 36.0 g/dL   RDW 13.9 11.5 - 14.5 %   Platelets 160 150 - 440 K/uL   Neutrophils Relative % 55 %   Neutro Abs 4.1 1.4 - 6.5 K/uL   Lymphocytes Relative 26 %   Lymphs Abs 1.9 1.0 - 3.6 K/uL   Monocytes Relative 11 %   Monocytes Absolute 0.8 0.2 - 1.0 K/uL   Eosinophils Relative 7 %   Eosinophils Absolute 0.5 0 - 0.7  K/uL   Basophils Relative 1 %   Basophils Absolute 0.1 0 - 0.1 K/uL    Comment: Performed at [facility], [address] [place], [place] [address]  Comprehensive metabolic panel     Status: Abnormal   Collection Time: 05/05/17 10:26 PM  Result Value Ref Range  Sodium 134 (L) 135 - 145 mmol/L   Potassium 4.3 3.5 - 5.1 mmol/L   Chloride 103 101 - 111 mmol/L   CO2 21 (L) 22 - 32 mmol/L   Glucose, Bld 104 (H) 65 - 99 mg/dL   BUN 23 (H) 6 - 20 mg/dL   Creatinine, Ser 1.19 0.61 - 1.24 mg/dL   Calcium 9.3 8.9 - 10.3 mg/dL   Total Protein 7.8 6.5 - 8.1 g/dL   Albumin 4.2 3.5 - 5.0 g/dL   AST 27 15 - 41 U/L   ALT 16 (L) 17 - 63 U/L   Alkaline Phosphatase 55 38 - 126 U/L   Total Bilirubin 0.8 0.3 - 1.2 mg/dL   GFR calc non Af Amer 50 (L) >60 mL/min   GFR calc Af Amer 58 (L) >60 mL/min    Comment: (NOTE) The eGFR has been calculated using the CKD EPI equation. This calculation has not been validated in all clinical situations. eGFR's persistently <60 mL/min signify possible Chronic Kidney Disease.    Anion gap 10 5 - 15    Comment: Performed at [facility], [address] [place], [place] [address]  Blood culture (routine x 2)     Status: None (Preliminary result)   Collection Time: 05/05/17 10:29 PM  Result Value Ref Range   Specimen Description BLOOD BLOOD RIGHT WRIST    Special Requests      BOTTLES DRAWN AEROBIC AND ANAEROBIC Blood Culture adequate volume   Culture      NO GROWTH < 12 HOURS Performed at [facility], [address] [place], [place] [address]    Report Status PENDING   Lactic acid, plasma     Status: None   Collection Time: 05/06/17 12:00 AM  Result Value Ref Range   Lactic Acid, Venous 1.9 0.5 - 1.9 mmol/L    Comment: Performed at [facility], [address] [place], [place] [address]  Lactic acid, plasma     Status: Abnormal   Collection Time: 05/06/17  1:49 AM  Result Value Ref Range   Lactic Acid, Venous  2.2 (HH) 0.5 - 1.9 mmol/L    Comment: CRITICAL RESULT CALLED TO, READ BACK BY AND VERIFIED WITH DEBORAH LADDING ON 05/06/17 AT [place] [facility] Performed at [facility], [address] [place], [place] [address]   CBC     Status: Abnormal   Collection Time: 05/06/17  1:49 AM  Result Value Ref Range   WBC 6.3 3.8 - 10.6 K/uL   RBC 3.84 (L) 4.40 - 5.90 MIL/uL   Hemoglobin 11.9 (L) 13.0 - 18.0 g/dL   HCT 35.1 (L) 40.0 - 52.0 %   MCV 91.3 80.0 - 100.0 fL   MCH 30.9 26.0 - 34.0 pg   MCHC 33.8 32.0 - 36.0 g/dL   RDW 13.8 11.5 - 14.5 %   Platelets 139 (L) 150 - 440 K/uL    Comment: Performed at [facility], [address] [place], [place] [address]  Creatinine, serum     Status: Abnormal   Collection Time: 05/06/17  1:49 AM  Result Value Ref Range   Creatinine, Ser 1.10 0.61 - 1.24 mg/dL   GFR calc non Af Amer 55 (L) >60 mL/min   GFR calc Af Amer >60 >60 mL/min    Comment: (NOTE) The eGFR has been calculated using the CKD EPI equation. This calculation has not been validated in all clinical situations. eGFR's persistently <60 mL/min signify possible Chronic Kidney Disease.  Performed at [facility], [address] [place], [place] [address]   Basic metabolic panel     Status: Abnormal   Collection Time: 05/06/17  5:00 AM  Result Value Ref Range   Sodium 134 (L) 135 - 145 mmol/L   Potassium 4.1 3.5 - 5.1 mmol/L   Chloride 105 101 - 111 mmol/L   CO2 19 (L) 22 - 32 mmol/L   Glucose, Bld 123 (H) 65 - 99 mg/dL   BUN 21 (H) 6 - 20 mg/dL   Creatinine, Ser 1.11 0.61 - 1.24 mg/dL   Calcium 8.7 (L) 8.9 - 10.3 mg/dL   GFR calc non Af Amer 55 (L) >60 mL/min   GFR calc Af Amer >60 >60 mL/min    Comment: (NOTE) The eGFR has been calculated using the CKD EPI equation. This calculation has not been validated in all clinical situations. eGFR's persistently <60 mL/min signify possible Chronic Kidney Disease.    Anion gap 10 5 - 15    Comment: Performed at  [facility], [place], [place] [address]  Lactic acid, plasma     Status: None   Collection Time: 05/06/17  7:50 AM  Result Value Ref Range   Lactic Acid, Venous 1.8 0.5 - 1.9 mmol/L    Comment: Performed at [facility], [address] [place], [place] [address]  Lactic acid, plasma     Status: None   Collection Time: 05/06/17 11:13 AM  Result Value Ref Range   Lactic Acid, Venous 1.5 0.5 - 1.9 mmol/L    Comment: Performed at [facility], [address] [place], [place] [address]   No components found for: ESR, C REACTIVE PROTEIN MICRO: Recent Results (from the past 720 hour(s))  MRSA PCR Screening     Status: Abnormal   Collection Time: 05/01/17  7:11 PM  Result Value Ref Range Status   MRSA by PCR POSITIVE (A) NEGATIVE Final    Comment:        The GeneXpert MRSA Assay (FDA approved for NASAL specimens only), is one component of a comprehensive MRSA colonization surveillance program. It is not intended to diagnose MRSA infection nor to guide or monitor treatment for MRSA infections. RESULT CALLED TO, READ BACK BY AND VERIFIED WITH: [facility] RN AT 2125 05/01/17. MSS Performed at [facility], [address] [place], [place] [address]   Blood culture (routine x 2)     Status: None (Preliminary result)   Collection Time: 05/05/17 10:00 PM  Result Value Ref Range Status   Specimen Description BLOOD RIGHT HAND  Final   Special Requests   Final    BOTTLES DRAWN AEROBIC AND ANAEROBIC Blood Culture adequate volume   Culture   Final    NO GROWTH < 12 HOURS Performed at [facility], [address], [place], [place] [address]    Report Status PENDING  Incomplete  Blood culture (routine x 2)     Status: None (Preliminary result)   Collection Time: 05/05/17 10:29 PM  Result Value Ref Range Status   Specimen Description BLOOD BLOOD RIGHT WRIST  Final   Special Requests   Final    BOTTLES DRAWN AEROBIC AND  ANAEROBIC Blood Culture adequate volume   Culture   Final    NO GROWTH < 12 HOURS Performed at [facility], [address], [place], [place] [address]    Report Status PENDING  Incomplete    IMAGING: Mr Foot Left Wo  Contrast  Result Date: 04/16/2017 CLINICAL DATA:  Pain in foot, prior fracture, toe pain and swelling. EXAM: MRI OF THE LEFT FOOT WITHOUT CONTRAST TECHNIQUE: Multiplanar, multisequence MR imaging of the left forefoot was performed. No intravenous contrast was administered. COMPARISON:  Multiple exams, including radiographs from 03/23/2017 FINDINGS: Bones/Joint/Cartilage Prior fracture of the distal metaphysis of the proximal phalanx fourth toe with ill definition of the distal fourth toe proximal phalanx head likely related to partial absorption and healing response, although without persistent marrow edema in this vicinity on inversion recovery weighted images. Interphalangeal loss of articular space, likely degenerative. No malalignment at the Lisfranc joint. First digit sesamoids unremarkable. Ligaments Lisfranc ligament intact on image 16/7. Muscles and Tendons Low-level edema tracks along the plantar musculotendinous structures. Mild flexor hallucis longus tenosynovitis at the knot of Henry's. Soft tissues Diffuse subcutaneous edema along the dorsum of the foot tracking into all of the toes, especially dorsally. IMPRESSION: 1. Prior fracture of the distal metaphysis of the proximal phalanx fourth toe, with poor definition of the small distal fracture fragment, but no significant degree of current marrow edema in this vicinity. No other fracture identified. 2. Subcutaneous edema in the dorsum of the foot extending into the toes. 3. Mild flexor hallucis longus tenosynovitis. 4. Low-level edema tracks along the plantar musculotendinous structures. Electronically Signed   By: Van Clines M.D.   On: 04/16/2017 07:57   US Venous Img Lower Unilateral Left  Result Date:  05/02/2017 CLINICAL DATA:  Left lower extremity pain and edema. History of malignancy. Evaluate for DVT. EXAM: LEFT LOWER EXTREMITY VENOUS DOPPLER ULTRASOUND TECHNIQUE: Gray-scale sonography with graded compression, as well as color Doppler and duplex ultrasound were performed to evaluate the lower extremity deep venous systems from the level of the common femoral vein and including the common femoral, femoral, profunda femoral, popliteal and calf veins including the posterior tibial, peroneal and gastrocnemius veins when visible. The superficial great saphenous vein was also interrogated. Spectral Doppler was utilized to evaluate flow at rest and with distal augmentation maneuvers in the common femoral, femoral and popliteal veins. COMPARISON:  None. FINDINGS: Contralateral Common Femoral Vein: Respiratory phasicity is normal and symmetric with the symptomatic side. No evidence of thrombus. Normal compressibility. Common Femoral Vein: No evidence of thrombus. Normal compressibility, respiratory phasicity and response to augmentation. Saphenofemoral Junction: No evidence of thrombus. Normal compressibility and flow on color Doppler imaging. Profunda Femoral Vein: No evidence of thrombus. Normal compressibility and flow on color Doppler imaging. Femoral Vein: No evidence of thrombus. Normal compressibility, respiratory phasicity and response to augmentation. Popliteal Vein: No evidence of thrombus. Normal compressibility, respiratory phasicity and response to augmentation. Calf Veins: No evidence of thrombus. Normal compressibility and flow on color Doppler imaging. Superficial Great Saphenous Vein: No evidence of thrombus. Normal compressibility. Venous Reflux:  None. Other Findings:  None. IMPRESSION: No evidence of DVT within the left lower extremity. Electronically Signed   By: Sandi Mariscal M.D.   On: 05/02/2017 09:10   US Venous Img Lower Unilateral Left  Result Date: 04/20/2017 CLINICAL DATA:  Left lower  extremity swelling and pain for several weeks EXAM: LEFT LOWER EXTREMITY VENOUS DOPPLER ULTRASOUND TECHNIQUE: Gray-scale sonography with graded compression, as well as color Doppler and duplex ultrasound were performed to evaluate the lower extremity deep venous systems from the level of the common femoral vein and including the common femoral, femoral, profunda femoral, popliteal and calf veins including the posterior tibial, peroneal and gastrocnemius veins when visible. The superficial great saphenous  vein was also interrogated. Spectral Doppler was utilized to evaluate flow at rest and with distal augmentation maneuvers in the common femoral, femoral and popliteal veins. COMPARISON:  None. FINDINGS: Contralateral Common Femoral Vein: Respiratory phasicity is normal and symmetric with the symptomatic side. No evidence of thrombus. Normal compressibility. Common Femoral Vein: No evidence of thrombus. Normal compressibility, respiratory phasicity and response to augmentation. Saphenofemoral Junction: No evidence of thrombus. Normal compressibility and flow on color Doppler imaging. Profunda Femoral Vein: No evidence of thrombus. Normal compressibility and flow on color Doppler imaging. Femoral Vein: No evidence of thrombus. Normal compressibility, respiratory phasicity and response to augmentation. Popliteal Vein: No evidence of thrombus. Normal compressibility, respiratory phasicity and response to augmentation. Calf Veins: Limited assessment of the calf veins secondary to peripheral edema. No gross occlusive thrombus. Superficial Great Saphenous Vein: No evidence of thrombus. Normal compressibility. Venous Reflux:  None. Other Findings:  Subcutaneous peripheral calf edema. IMPRESSION: No significant femoropopliteal DVT. Limited assessment of the calf veins. Subcutaneous peripheral edema. Electronically Signed   By: Jerilynn Mages.  Shick M.D.   On: 04/20/2017 15:26   Dg Knee Complete 4 Views Left  Result Date:  05/01/2017 CLINICAL DATA:  Erythema and swelling of the left lower leg for months. EXAM: LEFT KNEE - COMPLETE 4+ VIEW COMPARISON:  None. FINDINGS: Left total knee arthroplasty with two cannulated screws deep to the lateral aspect of the tibial tray. No joint effusion. There is nonspecific mild generalized soft tissue swelling about the left knee. No hardware loosening, fracture nor suspicious osseous lesions. IMPRESSION: Generalized soft tissue swelling about the left knee without acute osseous abnormality. No joint effusion. Status post left total knee arthroplasty without evidence of hardware failure. Electronically Signed   By: Ashley Royalty M.D.   On: 05/01/2017 18:10    Assessment:   Benjamin Villarreal is a 84 y.o. male admitted with recurrent moderate LLE cellulitis in setting of some chronic LLE edema following fracture of his L foot several months ago. He has been treated as opt with keflex and clinda per report. On admit he improved with vanco and zosyn but recurred after dc on bactrim. No positive cultures, MRSA PCR +.   Discussed with patient and daugther that this is likely recurrent dermatitis as much as it is cellulitis and is related to the recurrent edema, likely exacerbated by the recent foot fracture. He would benefit from wraps and prolonged oral abx.  This is likely a strep infection as there is no purulence noted so staph less likely. Bactrim on which he was dced is not a good strep drug so likely explains the rapid recurrence.  Recommendations Cont vanco,  I have elevated leg on 6 pillows and advised to continue to do so. If improving tomorrow would place unnaboot on LLE and can discharge on oral abx.  He would need to have the unnaboot changed q 3-4 days at [facility]. Would dc on oral keflex 50 mg bid and doxy 100 bid for 14 day course. I can try to see next week in clinic if we can work him in. Discussed with patient and daughter. Thank you very much for allowing me to participate in  the care of this patient. Please call with questions.   Cheral Marker. Ola Spurr, MD

## 2017-05-06 NOTE — ED Notes (Signed)
Patient assisted to the in-room commode with one person assist.

## 2017-05-06 NOTE — Care Management (Signed)
Patient presents from [facility] with Chronic progressive left lower extremity cellulitis. CSW updated.

## 2017-05-06 NOTE — Progress Notes (Signed)
Clinical Education officer, museum (CSW) received a call from [place] who reported patient can return to [facility] on [place]. Per Luellen Pucker if IV ABX is needed then patient will have to go to [facility].   [employer], LCSW [phone]

## 2017-05-06 NOTE — ED Notes (Signed)
Patient's left lower leg and foot red, swollen and hot to touch. Patient states it is painful now when trying to weight bare. Admitting MD in to evaluate for admission. Will place in observation. Patient does not have questions at this time. IV antibiotic started and patient educated about pain at the site as well as the entire arm and to call RN with call bell if any pain should occur.

## 2017-05-06 NOTE — Progress Notes (Signed)
[place] [facility] [place] NAME: Benjamin Villarreal    MR#:  [id number]  DATE OF BIRTH:  [birth year]  SUBJECTIVE:   Patient was discharged on oral antibiotics for left lower extremity cellulitis however reports that left lower extremity cellulitis has worsened.  REVIEW OF SYSTEMS:    Review of Systems  Constitutional: Negative for fever, chills weight loss HENT: Negative for ear pain, nosebleeds, congestion, facial swelling, rhinorrhea, neck pain, neck stiffness and ear discharge.   Respiratory: Negative for cough, shortness of breath, wheezing  Cardiovascular: Negative for chest pain, palpitations and leg swelling.  Gastrointestinal: Negative for heartburn, abdominal pain, vomiting, diarrhea or consitpation Genitourinary: Negative for dysuria, urgency, frequency, hematuria Musculoskeletal: Negative for back pain or joint pain Neurological: Negative for dizziness, seizures, syncope, focal weakness,  numbness and headaches.  Hematological: Does not bruise/bleed easily.  Psychiatric/Behavioral: Negative for hallucinations, confusion, dysphoric mood SKIN: left leg cellulitis   Tolerating Diet: yes      DRUG ALLERGIES:   Allergies  Allergen Reactions  . Other     RAW ONIONS- SINUS REACTION    VITALS:  Blood pressure (!) 148/68, pulse 65, temperature 98.5 F (36.9 C), temperature source Oral, resp. rate 18, height 6\' 2"  (1.88 m), weight 79.2 kg (174 lb 9.7 oz), SpO2 98 %.  PHYSICAL EXAMINATION:  Constitutional: Appears well-developed and well-nourished. No distress. HENT: Normocephalic. Marland Kitchen Oropharynx is clear and moist.  Eyes: Conjunctivae and EOM are normal. PERRLA, no scleral icterus.  Neck: Normal ROM. Neck supple. No JVD. No tracheal deviation. CVS: RRR, S1/S2 +, no murmurs, no gallops, no carotid bruit.  Pulmonary: Effort and breath sounds normal, no stridor, rhonchi, wheezes, rales.  Abdominal: Soft. BS +,  no distension, tenderness,  rebound or guarding.  Musculoskeletal: Normal range of motion. No edema and no tenderness.  Neuro: Alert. CN 2-12 grossly intact. No focal deficits. Skin: leg left warm and res no swelling Psychiatric: Normal mood and affect.      LABORATORY PANEL:   CBC Recent Labs  Lab 05/06/17 0149  WBC 6.3  HGB 11.9*  HCT 35.1*  PLT 139*   ------------------------------------------------------------------------------------------------------------------  Chemistries  Recent Labs  Lab 05/05/17 2226  05/06/17 0500  NA 134*  --  134*  K 4.3  --  4.1  CL 103  --  105  CO2 21*  --  19*  GLUCOSE 104*  --  123*  BUN 23*  --  21*  CREATININE 1.19   < > 1.11  CALCIUM 9.3  --  8.7*  AST 27  --   --   ALT 16*  --   --   ALKPHOS 55  --   --   BILITOT 0.8  --   --    < > = values in this interval not displayed.   ------------------------------------------------------------------------------------------------------------------  Cardiac Enzymes No results for input(s): TROPONINI in the last 168 hours. ------------------------------------------------------------------------------------------------------------------  RADIOLOGY:  No results found.   ASSESSMENT AND PLAN:   84 year old male with a history of prostate cancer who was recently discharged for left lower extremity cellulitis presents due to progressive cellulitis.   1. Cellulitiswith chronic : Continue vancomycin Elevate leg ID consultation for further recommendations Dopplers recently performed which were negative for DVT.  2. History of prostate cancer: Follows with UROLOGY   Management plans discussed with the patient and he is in agreement.  CODE STATUS: full  TOTAL TIME TAKING CARE OF THIS PATIENT: 30 minutes.  POSSIBLE D/C 2 days, DEPENDING ON CLINICAL CONDITION.   Rosabel Sermeno M.D on 05/06/2017 at 8:14 AM  Between 7am to 6pm - Pager - [phone] After 6pm go to [email or web address] - password [email or web address]  [facility]  Office  [phone]  CC: Primary care physician; Venia Carbon, MD  Note: This dictation was prepared with Dragon dictation along with smaller phrase technology. Any transcriptional errors that result from this process are unintentional.

## 2017-05-06 NOTE — Telephone Encounter (Signed)
He was readmitted yesterday. Rollene Fare was going to see him today at his apartment. Please let [facility] know that we will follow up with him at [facility] and appointment should not be made in the office.

## 2017-05-06 NOTE — Plan of Care (Signed)
  Progressing Pain Managment: General experience of comfort will improve 05/06/2017 1505 - Progressing by Rolley Sims, RN Safety: Ability to remain free from injury will improve 05/06/2017 1505 - Progressing by Rolley Sims, RN Clinical Measurements: Ability to avoid or minimize complications of infection will improve 05/06/2017 1505 - Progressing by Rolley Sims, RN

## 2017-05-06 NOTE — NC FL2 (Signed)
[place] LEVEL OF CARE SCREENING TOOL     IDENTIFICATION  Patient Name: Benjamin Villarreal Birthdate: 1920-12-13 Sex: male Admission Date (Current Location): 05/05/2017  [place] and [place] Number:  Engineering geologist and Address:  [facility], [address] [place], [place] [address]      Provider Number: [id number]  Attending Physician Name and Address:  Bettey Costa, MD  Relative Name and Phone Number:       Current Level of Care: Hospital Recommended Level of Care: [place] Prior Approval Number:    Date Approved/Denied:   PASRR Number:   Discharge Plan: Domiciliary (Rest home)    Current Diagnoses: Patient Active Problem List   Diagnosis Date Noted  . Cellulitis 05/05/2017  . Aortic atherosclerosis ([place]) 10/09/2016  . Iron deficiency anemia due to chronic blood loss 10/05/2016  . Peripheral neuropathy 01/24/2016  . 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 Allergic rhinitis due to pollen 10/21/2012  . Prostate cancer ([place]) 03/22/2009  . Chronic venous insufficiency 09/07/2008  . CONSTIPATION, CHRONIC 09/07/2008  . Osteoarthritis, multiple sites 09/05/2008  . Osteoporosis 09/05/2008    Orientation RESPIRATION BLADDER Height & Weight     Self, Time, Situation, Place  Normal Continent Weight: 174 lb 9.7 oz (79.2 kg) Height:  6\' 2"  (188 cm)  BEHAVIORAL SYMPTOMS/MOOD NEUROLOGICAL BOWEL NUTRITION STATUS      Continent Diet(Diet: Heart Healthy )  AMBULATORY STATUS COMMUNICATION OF NEEDS Skin   Supervision Verbally Normal                       Personal Care Assistance Level of Assistance  Bathing, Feeding, Dressing Bathing Assistance: Limited assistance Feeding assistance: Independent Dressing Assistance: Limited assistance     Functional Limitations Info  Sight, Hearing, Speech Sight Info: Adequate Hearing Info: Impaired Speech Info: Adequate    SPECIAL CARE FACTORS FREQUENCY  PT (By licensed  PT)     PT Frequency: (2-3 home health. )              Contractures      Additional Factors Info  Code Status, Allergies, Isolation Precautions Code Status Info: (Full Code. ) Allergies Info: (raw onions-sinus reaction. )     Isolation Precautions Info: (MRSA Nasal Swab. )     Current Medications (05/06/2017):  This is the current hospital active medication list Current Facility-Administered Medications  Medication Dose Route Frequency Provider Last Rate Last Dose  . 0.9 %  sodium chloride infusion   Intravenous Continuous Harrie Foreman, MD 125 mL/hr at 05/06/17 0423    . acetaminophen (TYLENOL) tablet 650 mg  650 mg Oral Q6H PRN Lance Coon, MD       Or  . acetaminophen (TYLENOL) suppository 650 mg  650 mg Rectal Q6H PRN Lance Coon, MD      . aspirin EC tablet 81 mg  81 mg Oral Jeannie Done, MD   81 mg at 05/06/17 1004  . cyclobenzaprine (FLEXERIL) tablet 5 mg  5 mg Oral QHS PRN Lance Coon, MD      . diphenhydrAMINE (BENADRYL) capsule 25 mg  25 mg Oral Q4H PRN Lance Coon, MD      . enoxaparin (LOVENOX) injection 40 mg  40 mg Subcutaneous Q24H Lance Coon, MD   40 mg at 05/06/17 0314  . latanoprost (XALATAN) 0.005 % ophthalmic solution 1 drop  1 drop Both Eyes QHS Lance Coon, MD      . magnesium hydroxide (  MILK OF MAGNESIA) suspension 30 mL  30 mL Oral Daily PRN Lance Coon, MD      . ondansetron [facility]) tablet 4 mg  4 mg Oral Q6H PRN Lance Coon, MD       Or  . ondansetron [facility]) injection 4 mg  4 mg Intravenous Q6H PRN Lance Coon, MD      . oxyCODONE (Oxy IR/ROXICODONE) immediate release tablet 5 mg  5 mg Oral Q4H PRN Lance Coon, MD      . polyethylene glycol (MIRALAX / GLYCOLAX) packet 8.5 g  8.5 g Oral Daily Lance Coon, MD      . senna-docusate (Senokot-S) tablet 4 tablet  4 tablet Oral QPM Lance Coon, MD      . sodium chloride 0.9 % bolus 1,000 mL  1,000 mL Intravenous PRN Harrie Foreman, MD   Stopped at 05/06/17 [phone]  .  vancomycin (VANCOCIN) 1,500 mg in sodium chloride 0.9 % 500 mL IVPB  1,500 mg Intravenous Q24H Lance Coon, MD   Stopped at 05/06/17 1005   Facility-Administered Medications Ordered in Other Encounters  Medication Dose Route Frequency Provider Last Rate Last Dose  . leuprolide (LUPRON) injection 30 mg  30 mg Intramuscular Once Leia Alf, MD         Discharge Medications: Please see discharge summary for a list of discharge medications.  Relevant Imaging Results:  Relevant Lab Results:   Additional Information (SSN: [id number])  Keyden Pavlov, Veronia Beets, LCSW

## 2017-05-06 NOTE — Progress Notes (Signed)
Lab called Patient lactic acid 2.2. MD paged. Awaiting call back.

## 2017-05-07 ENCOUNTER — Telehealth: Payer: Self-pay | Admitting: *Deleted

## 2017-05-07 MED ORDER — DOXYCYCLINE HYCLATE 100 MG PO CAPS: 100.0000 mg | ORAL_CAPSULE | Freq: Two times a day (BID) | ORAL | 0 refills | Status: AC

## 2017-05-07 MED ORDER — HYDROCODONE-ACETAMINOPHEN 5-325 MG PO TABS: 1.0000 | ORAL_TABLET | Freq: Four times a day (QID) | ORAL | 0 refills | Status: DC | PRN

## 2017-05-07 MED ORDER — CEPHALEXIN 500 MG PO CAPS: 500.0000 mg | ORAL_CAPSULE | Freq: Four times a day (QID) | ORAL | 0 refills | Status: AC

## 2017-05-07 NOTE — Telephone Encounter (Signed)
Called pt concerning hosp f/u appt that has been schedule for tomorrow w/Dr. Silvio Pate @ 8:15 am to make sure he is aware of appt and completed TCM call below.Benjamin Villarreal  Transition Care Management Follow-up Telephone Call   Date discharged? 05/07/17   How have you been since you were released from the hospital? Spoke pt/daughter Benjamin Villarreal) she had dad on speaker phone. He states he is doing ok   Do you understand why you were in the hospital? YES   Do you understand the discharge instructions? YES   Where were you discharged to? Assisted Living Facility   Items Reviewed:  Medications reviewed: YES  Allergies reviewed: YES  Dietary changes reviewed: NO  Referrals reviewed: No referrals needed   Functional Questionnaire:   Activities of Daily Living (ADLs):   He states he are independent in the following: bathing and hygiene, feeding, continence, grooming and toileting States they require assistance with the following: ambulation and dressing   Any transportation issues/concerns?: NO   Any patient concerns? YES, daughter is needing to know will Dr. Silvio Pate will come to the facility to see father. Inform daughter will contact [facility] to make sure, and give them a call back. I was not 100% sure    Confirmed importance and date/time of follow-up visits scheduled YES, 05/08/17  Provider Appointment booked with Dr. Silvio Pate  Confirmed with patient if condition begins to worsen call PCP or go to the ER.  Patient was given the office number and encouraged to call back with question or concerns.  : YES

## 2017-05-07 NOTE — Progress Notes (Signed)
Pt in no acute distress. VSS. Discharge instructions and prescriptions delivered to daughter and instructed to give to nurse at [facility] facility. Unna boot applied to LLE. Report called to [place] at [facility].

## 2017-05-07 NOTE — Care Management Note (Addendum)
Case Management Note  Patient Details  Name: Benjamin Villarreal MRN: [id number] Date of Birth: 29-Nov-1920  Subjective/Objective:  Readmit from [facility] with left lower extremity cellulitis. Discharging today. Will need RN for UNA boot changes. Attempted to reach daughter, Benjamin Villarreal [phone] by phone without success. Will arrange home health RN with [facility].   Action/Plan: Referral to [facility] with [facility].     TC to daughter, Benjamin Villarreal, updated her on POC and she is agreeable. TC to [place], RN @ [facility], updated on POC.                 Expected Discharge Date:  05/07/17               Expected Discharge Plan:  [place]  In-House Referral:  Clinical Social Work  Discharge planning Services  CM Consult  Post Acute Care Choice:  Home Health Choice offered to:     DME Arranged:    DME Agency:     HH Arranged:  RN [place] Agency:  [place]  Status of Service:  Completed, signed off  If discussed at [employer] of Stay Meetings, dates discussed:    Additional Comments:  Jolly Mango, RN 05/07/2017, 8:44 AM

## 2017-05-07 NOTE — Telephone Encounter (Signed)
Called [facility] spoke w/office manager Erin to see if Dr. Silvio Pate will go to the facility to see the patient. She was not sure either, will have his nurse to give me a call back to confirm.Marland KitchenJohny Villarreal

## 2017-05-07 NOTE — Progress Notes (Signed)
Patient is medically stable for D/C back to [facility] today. Per Luellen Pucker nurse at [facility] patient can return today. RN will call report to Luellen Pucker and patient's daughter Truman Hayward will transport him between 11 am and 12 pm today. Clinical Education officer, museum (CSW) sent D/C orders to [facility] via [place]. Patient is aware of above. Patient's daughter [name] is aware of above. Please reconsult if future social work needs arise. CSW signing off.   [employer], LCSW [phone]

## 2017-05-07 NOTE — Telephone Encounter (Signed)
Rec'd mag back from [place] per [place], NP  " [05/07/2017 2:20 PM]  Ali Lowe:   hi there regina said he does not need a tcm there is nothing that you need to do for him thanks for checking!".Marland KitchenJohny Chess

## 2017-05-07 NOTE — Discharge Summary (Signed)
[facility] NAME: Benjamin Villarreal    MR#:  [id number]  DATE OF BIRTH:  May 31, 1920  DATE OF ADMISSION:  05/05/2017 ADMITTING PHYSICIAN: Lance Coon, MD  DATE OF DISCHARGE: 3.14.2019  PRIMARY CARE PHYSICIAN: Venia Carbon, MD    ADMISSION DIAGNOSIS:  Cellulitis of left lower extremity [K48.185]  DISCHARGE DIAGNOSIS:  Principal Problem:   Cellulitis Active Problems:   Prostate cancer ([place])   Chronic venous insufficiency   CONSTIPATION, CHRONIC   Aortic atherosclerosis ([place])   SECONDARY DIAGNOSIS:   Past Medical History:  Diagnosis Date  . Aortic atherosclerosis ([place])    Noted on CT  . Arthritis   . Colon polyps    adenomatous  . History of fracture of tibia   . History of tachycardia    patient unaware of this  . Kyphosis of cervicothoracic region    SEVERE  . Osteoporosis 01/2014   T -3.8 (01/2014)  . Other constipation    chronic  . Prostate cancer ([place])    surgery then lupron  . Unspecified venous (peripheral) insufficiency   . Vitamin B12 deficiency     HOSPITAL COURSE:   84 year old male with a history of prostate cancer who was recently discharged for left lower extremity cellulitis presents due to progressive cellulitis.   1.left lower extremity cellulitis, recurrent in the setting of chronic left lower extremity edema and chronic and venous insufficiency As per ID consultant this is likely recurrent dermatitis and cellulitisrelated to recurrent edema exacerbated by recent foot fracture. Recommendations are to continue with Unna boots. Elevate leg on 5-6 pillows and continue oral antibiotics for 14 days. He will be discharged  Keflex and doxycycline. He will follow-up with Dr. Ola Spurr in one week. This is likely a strep infection as there is no purulence noted so staff is less likely per ID consulted.  Dopplers recently performed which were negative for DVT.  2. History of prostate cancer: Follows  with UROLOGY     DISCHARGE CONDITIONS AND DIET:  Stable Regular diet  CONSULTS OBTAINED:  Treatment Team:  Leonel Ramsay, MD  DRUG ALLERGIES:   Allergies  Allergen Reactions  . Other     RAW ONIONS- SINUS REACTION    DISCHARGE MEDICATIONS:   Allergies as of 05/07/2017      Reactions   Other    RAW ONIONS- SINUS REACTION      Medication List    STOP taking these medications   clindamycin 150 MG capsule Commonly known as:  CLEOCIN   sulfamethoxazole-trimethoprim 800-160 MG tablet Commonly known as:  BACTRIM DS,SEPTRA DS     TAKE these medications   acetaminophen 325 MG tablet Commonly known as:  TYLENOL Take 325-650 mg by mouth every 4 (four) hours as needed for fever (general discomfort).   alum & mag hydroxide-simeth 200-200-20 MG/5ML suspension Commonly known as:  MAALOX/MYLANTA Take 30 mLs by mouth every 4 (four) hours as needed for indigestion or heartburn.   aspirin EC 81 MG tablet Take 81 mg by mouth every other day.   Biotin 2.5 MG Caps Take 2.5 mg by mouth daily.   Calcium-D 600-400 MG-UNIT Tabs Take 1 tablet by mouth daily.   carbamide peroxide 6.5 % OTIC solution Commonly known as:  DEBROX 5 drops daily as needed.   cephALEXin 500 MG capsule Commonly known as:  KEFLEX Take 1 capsule (500 mg total) by mouth 4 (four) times daily for 14 days.   cholecalciferol 1000 units  tablet Commonly known as:  VITAMIN D Take 1,000 Units by mouth 2 (two) times daily.   cyclobenzaprine 5 MG tablet Commonly known as:  FLEXERIL Take 5 mg by mouth at bedtime as needed for muscle spasms.   dextromethorphan-guaiFENesin 10-100 MG/5ML liquid Commonly known as:  ROBITUSSIN-DM Take 10 mLs by mouth every 4 (four) hours as needed for cough.   diphenhydrAMINE 25 mg capsule Commonly known as:  BENADRYL Take 25 mg by mouth every 4 (four) hours as needed for itching or allergies.   doxycycline 100 MG capsule Commonly known as:  VIBRAMYCIN Take 1 capsule  (100 mg total) by mouth 2 (two) times daily for 14 days.   eucerin cream Apply 1 application topically daily.   fluticasone 50 MCG/ACT nasal spray Commonly known as:  FLONASE Place 1 spray into both nostrils daily.   HYDROcodone-acetaminophen 5-325 MG tablet Commonly known as:  NORCO/VICODIN Take 1 tablet by mouth as needed for moderate pain.   ibuprofen 200 MG tablet Commonly known as:  ADVIL,MOTRIN Take 400 mg by mouth 2 (two) times daily as needed for headache or mild pain.   KAOPECTATE 262 MG/15ML suspension Generic drug:  bismuth subsalicylate Take 10 mLs by mouth 3 (three) times daily as needed for diarrhea or loose stools.   latanoprost 0.005 % ophthalmic solution Commonly known as:  XALATAN Place 1 drop into both eyes at bedtime.   loratadine 10 MG tablet Commonly known as:  CLARITIN Take 20 mg by mouth at bedtime.   magnesium hydroxide 400 MG/5ML suspension Commonly known as:  MILK OF MAGNESIA Take 30 mLs by mouth daily as needed for mild constipation.   mupirocin ointment 2 % Commonly known as:  BACTROBAN Place 1 application into the nose 2 (two) times daily.   nystatin powder Generic drug:  nystatin Apply 1 g topically 2 (two) times daily as needed (rash). Use 1 application twice daily as needed for rash   polyethylene glycol powder powder Commonly known as:  GLYCOLAX/MIRALAX FILL TO LINE (17 GRAMS ), MIX AND DRINK TWICE A DAY AND 8.5 GRAMS AT NOON   sennosides-docusate sodium 8.6-50 MG tablet Commonly known as:  SENOKOT-S Take 4 tablets by mouth every evening.   sodium chloride 0.65 % Soln nasal spray Commonly known as:  OCEAN Place 1-2 sprays into both nostrils 2 (two) times daily as needed for congestion.   traMADol 50 MG tablet Commonly known as:  ULTRAM Take 1 tablet (50 mg total) by mouth every 6 (six) hours as needed for moderate pain.   triamcinolone cream 0.1 % Commonly known as:  KENALOG Apply 1 application topically daily.    triamcinolone cream 0.1 % Commonly known as:  KENALOG Apply 1 application topically daily as needed.   vitamin B-12 1000 MCG tablet Commonly known as:  CYANOCOBALAMIN Take 1,000 mcg by mouth daily.   vitamin C 500 MG tablet Commonly known as:  ASCORBIC ACID Take 500 mg by mouth daily.   XTANDI 40 MG capsule Generic drug:  enzalutamide TAKE 3 CAPSULES (120 MG TOTAL) BY MOUTH DAILY.         Today   CHIEF COMPLAINT:  Doing well no issues   VITAL SIGNS:  Blood pressure 132/60, pulse 66, temperature 98 F (36.7 C), temperature source Oral, resp. rate 19, height 6\' 2"  (1.88 m), weight 79.2 kg (174 lb 9.7 oz), SpO2 97 %.   REVIEW OF SYSTEMS:  Review of Systems  Constitutional: Negative.  Negative for chills, fever and malaise/fatigue.  HENT:  Negative.  Negative for ear discharge, ear pain, hearing loss, nosebleeds and sore throat.   Eyes: Negative.  Negative for blurred vision and pain.  Respiratory: Negative.  Negative for cough, hemoptysis, shortness of breath and wheezing.   Cardiovascular: Negative.  Negative for chest pain, palpitations and leg swelling.  Gastrointestinal: Negative.  Negative for abdominal pain, blood in stool, diarrhea, nausea and vomiting.  Genitourinary: Negative.  Negative for dysuria.  Musculoskeletal: Negative.  Negative for back pain.  Skin: Negative.        Improved redness leg  Neurological: Negative for dizziness, tremors, speech change, focal weakness, seizures and headaches.  Endo/Heme/Allergies: Negative.  Does not bruise/bleed easily.  Psychiatric/Behavioral: Negative.  Negative for depression, hallucinations and suicidal ideas.     PHYSICAL EXAMINATION:  GENERAL:  84 y.o.-year-old patient lying in the bed with no acute distress.  NECK:  Supple, no jugular venous distention. No thyroid enlargement, no tenderness.  LUNGS: Normal breath sounds bilaterally, no wheezing, rales,rhonchi  No use of accessory muscles of respiration.   CARDIOVASCULAR: S1, S2 normal. No murmurs, rubs, or gallops.  ABDOMEN: Soft, non-tender, non-distended. Bowel sounds present. No organomegaly or mass.  EXTREMITIES: No pedal edema, cyanosis, or clubbing.  PSYCHIATRIC: The patient is alert and oriented x 3.  SKIN: mild edema some redness no pain or tenderness  DATA REVIEW:   CBC Recent Labs  Lab 05/06/17 0149  WBC 6.3  HGB 11.9*  HCT 35.1*  PLT 139*    Chemistries  Recent Labs  Lab 05/05/17 2226  05/06/17 0500  NA 134*  --  134*  K 4.3  --  4.1  CL 103  --  105  CO2 21*  --  19*  GLUCOSE 104*  --  123*  BUN 23*  --  21*  CREATININE 1.19   < > 1.11  CALCIUM 9.3  --  8.7*  AST 27  --   --   ALT 16*  --   --   ALKPHOS 55  --   --   BILITOT 0.8  --   --    < > = values in this interval not displayed.    Cardiac Enzymes No results for input(s): TROPONINI in the last 168 hours.  Microbiology Results  @MICRORSLT48 @  RADIOLOGY:  No results found.    Allergies as of 05/07/2017      Reactions   Other    RAW ONIONS- SINUS REACTION      Medication List    STOP taking these medications   clindamycin 150 MG capsule Commonly known as:  CLEOCIN   sulfamethoxazole-trimethoprim 800-160 MG tablet Commonly known as:  BACTRIM DS,SEPTRA DS     TAKE these medications   acetaminophen 325 MG tablet Commonly known as:  TYLENOL Take 325-650 mg by mouth every 4 (four) hours as needed for fever (general discomfort).   alum & mag hydroxide-simeth 200-200-20 MG/5ML suspension Commonly known as:  MAALOX/MYLANTA Take 30 mLs by mouth every 4 (four) hours as needed for indigestion or heartburn.   aspirin EC 81 MG tablet Take 81 mg by mouth every other day.   Biotin 2.5 MG Caps Take 2.5 mg by mouth daily.   Calcium-D 600-400 MG-UNIT Tabs Take 1 tablet by mouth daily.   carbamide peroxide 6.5 % OTIC solution Commonly known as:  DEBROX 5 drops daily as needed.   cephALEXin 500 MG capsule Commonly known as:   KEFLEX Take 1 capsule (500 mg total) by mouth 4 (four) times daily for  14 days.   cholecalciferol 1000 units tablet Commonly known as:  VITAMIN D Take 1,000 Units by mouth 2 (two) times daily.   cyclobenzaprine 5 MG tablet Commonly known as:  FLEXERIL Take 5 mg by mouth at bedtime as needed for muscle spasms.   dextromethorphan-guaiFENesin 10-100 MG/5ML liquid Commonly known as:  ROBITUSSIN-DM Take 10 mLs by mouth every 4 (four) hours as needed for cough.   diphenhydrAMINE 25 mg capsule Commonly known as:  BENADRYL Take 25 mg by mouth every 4 (four) hours as needed for itching or allergies.   doxycycline 100 MG capsule Commonly known as:  VIBRAMYCIN Take 1 capsule (100 mg total) by mouth 2 (two) times daily for 14 days.   eucerin cream Apply 1 application topically daily.   fluticasone 50 MCG/ACT nasal spray Commonly known as:  FLONASE Place 1 spray into both nostrils daily.   HYDROcodone-acetaminophen 5-325 MG tablet Commonly known as:  NORCO/VICODIN Take 1 tablet by mouth as needed for moderate pain.   ibuprofen 200 MG tablet Commonly known as:  ADVIL,MOTRIN Take 400 mg by mouth 2 (two) times daily as needed for headache or mild pain.   KAOPECTATE 262 MG/15ML suspension Generic drug:  bismuth subsalicylate Take 10 mLs by mouth 3 (three) times daily as needed for diarrhea or loose stools.   latanoprost 0.005 % ophthalmic solution Commonly known as:  XALATAN Place 1 drop into both eyes at bedtime.   loratadine 10 MG tablet Commonly known as:  CLARITIN Take 20 mg by mouth at bedtime.   magnesium hydroxide 400 MG/5ML suspension Commonly known as:  MILK OF MAGNESIA Take 30 mLs by mouth daily as needed for mild constipation.   mupirocin ointment 2 % Commonly known as:  BACTROBAN Place 1 application into the nose 2 (two) times daily.   nystatin powder Generic drug:  nystatin Apply 1 g topically 2 (two) times daily as needed (rash). Use 1 application twice daily  as needed for rash   polyethylene glycol powder powder Commonly known as:  GLYCOLAX/MIRALAX FILL TO LINE (17 GRAMS ), MIX AND DRINK TWICE A DAY AND 8.5 GRAMS AT NOON   sennosides-docusate sodium 8.6-50 MG tablet Commonly known as:  SENOKOT-S Take 4 tablets by mouth every evening.   sodium chloride 0.65 % Soln nasal spray Commonly known as:  OCEAN Place 1-2 sprays into both nostrils 2 (two) times daily as needed for congestion.   traMADol 50 MG tablet Commonly known as:  ULTRAM Take 1 tablet (50 mg total) by mouth every 6 (six) hours as needed for moderate pain.   triamcinolone cream 0.1 % Commonly known as:  KENALOG Apply 1 application topically daily.   triamcinolone cream 0.1 % Commonly known as:  KENALOG Apply 1 application topically daily as needed.   vitamin B-12 1000 MCG tablet Commonly known as:  CYANOCOBALAMIN Take 1,000 mcg by mouth daily.   vitamin C 500 MG tablet Commonly known as:  ASCORBIC ACID Take 500 mg by mouth daily.   XTANDI 40 MG capsule Generic drug:  enzalutamide TAKE 3 CAPSULES (120 MG TOTAL) BY MOUTH DAILY.          Management plans discussed with the patient and he is in agreement. Stable for discharge ALF  Patient should follow up with pcp  CODE STATUS:     Code Status Orders  (From admission, onward)        Start     Ordered   05/06/17 0136  Full code  Continuous  05/06/17 0135    Code Status History    Date Active Date Inactive Code Status Order ID Comments User Context   05/01/2017 18:40 05/04/2017 19:49 Full Code [id number]  Nicholes Mango, MD Inpatient    Advance Directive Documentation     Most Recent Value  Type of Advance Directive  Healthcare Power of Attorney  Pre-existing out of facility DNR order (yellow form or pink MOST form)  No data  "MOST" Form in Place?  No data      TOTAL TIME TAKING CARE OF THIS PATIENT: 38 minutes.    Note: This dictation was prepared with Dragon dictation along with smaller phrase  technology. Any transcriptional errors that result from this process are unintentional.  Delesia Martinek M.D on 05/07/2017 at 7:54 AM  Between 7am to 6pm - Pager - [phone] After 6pm go to [email or web address] - password [email or web address] [facility]  Office  [phone]  CC: Primary care physician; Venia Carbon, MD

## 2017-05-07 NOTE — NC FL2 (Signed)
[place] LEVEL OF CARE SCREENING TOOL     IDENTIFICATION  Patient Name: Benjamin Villarreal Birthdate: [birth year] Sex: male Admission Date (Current Location): 05/05/2017  [place] and [place] Number:  Engineering geologist and Address:  [facility], [address], [place], [place] [address]      Provider Number: [id number]  Attending Physician Name and Address:  Bettey Costa, MD  Relative Name and Phone Number:       Current Level of Care: Hospital Recommended Level of Care: [place] Prior Approval Number:    Date Approved/Denied:   PASRR Number:   Discharge Plan: Domiciliary (Rest home)    Current Diagnoses: Patient Active Problem List   Diagnosis Date Noted  . Cellulitis 05/05/2017  . Aortic atherosclerosis ([place]) 10/09/2016  . Iron deficiency anemia due to chronic blood loss 10/05/2016  . Peripheral neuropathy 01/24/2016  . 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 Allergic rhinitis due to pollen 10/21/2012  . Prostate cancer ([place]) 03/22/2009  . Chronic venous insufficiency 09/07/2008  . CONSTIPATION, CHRONIC 09/07/2008  . Osteoarthritis, multiple sites 09/05/2008  . Osteoporosis 09/05/2008    Orientation RESPIRATION BLADDER Height & Weight     Self, Time, Situation, Place  Normal Continent Weight: 174 lb 9.7 oz (79.2 kg) Height:  6\' 2"  (188 cm)  BEHAVIORAL SYMPTOMS/MOOD NEUROLOGICAL BOWEL NUTRITION STATUS      Continent Diet(Diet: Heart Healthy )  AMBULATORY STATUS COMMUNICATION OF NEEDS Skin   Supervision Verbally Normal                       Personal Care Assistance Level of Assistance  Bathing, Feeding, Dressing Bathing Assistance: Limited assistance Feeding assistance: Independent Dressing Assistance: Limited assistance     Functional Limitations Info  Sight, Hearing, Speech Sight Info: Adequate Hearing Info: Impaired Speech Info: Adequate    SPECIAL CARE FACTORS FREQUENCY  PT (By licensed  PT)     PT Frequency: (2-3 home health. )              Contractures      Additional Factors Info  Code Status, Allergies, Isolation Precautions Code Status Info: (Full Code. ) Allergies Info: (raw onions-sinus reaction. )     Isolation Precautions Info: (MRSA Nasal Swab. )    Discharge Medications: Please see discharge summary for a list of discharge medications. Medication List     STOP taking these medications   clindamycin 150 MG capsule Commonly known as:  CLEOCIN   sulfamethoxazole-trimethoprim 800-160 MG tablet Commonly known as:  BACTRIM DS,SEPTRA DS     TAKE these medications   acetaminophen 325 MG tablet Commonly known as:  TYLENOL Take 325-650 mg by mouth every 4 (four) hours as needed for fever (general discomfort).   alum & mag hydroxide-simeth 200-200-20 MG/5ML suspension Commonly known as:  MAALOX/MYLANTA Take 30 mLs by mouth every 4 (four) hours as needed for indigestion or heartburn.   aspirin EC 81 MG tablet Take 81 mg by mouth every other day.   Biotin 2.5 MG Caps Take 2.5 mg by mouth daily.   Calcium-D 600-400 MG-UNIT Tabs Take 1 tablet by mouth daily.   carbamide peroxide 6.5 % OTIC solution Commonly known as:  DEBROX 5 drops daily as needed.   cephALEXin 500 MG capsule Commonly known as:  KEFLEX Take 1 capsule (500 mg total) by mouth 4 (four) times daily for 14 days.   cholecalciferol 1000 units tablet Commonly known as:  VITAMIN D  Take 1,000 Units by mouth 2 (two) times daily.   cyclobenzaprine 5 MG tablet Commonly known as:  FLEXERIL Take 5 mg by mouth at bedtime as needed for muscle spasms.   dextromethorphan-guaiFENesin 10-100 MG/5ML liquid Commonly known as:  ROBITUSSIN-DM Take 10 mLs by mouth every 4 (four) hours as needed for cough.   diphenhydrAMINE 25 mg capsule Commonly known as:  BENADRYL Take 25 mg by mouth every 4 (four) hours as needed for itching or allergies.   doxycycline 100 MG  capsule Commonly known as:  VIBRAMYCIN Take 1 capsule (100 mg total) by mouth 2 (two) times daily for 14 days.   eucerin cream Apply 1 application topically daily.   fluticasone 50 MCG/ACT nasal spray Commonly known as:  FLONASE Place 1 spray into both nostrils daily.   HYDROcodone-acetaminophen 5-325 MG tablet Commonly known as:  NORCO/VICODIN Take 1 tablet by mouth as needed for moderate pain.   ibuprofen 200 MG tablet Commonly known as:  ADVIL,MOTRIN Take 400 mg by mouth 2 (two) times daily as needed for headache or mild pain.   KAOPECTATE 262 MG/15ML suspension Generic drug:  bismuth subsalicylate Take 10 mLs by mouth 3 (three) times daily as needed for diarrhea or loose stools.   latanoprost 0.005 % ophthalmic solution Commonly known as:  XALATAN Place 1 drop into both eyes at bedtime.   loratadine 10 MG tablet Commonly known as:  CLARITIN Take 20 mg by mouth at bedtime.   magnesium hydroxide 400 MG/5ML suspension Commonly known as:  MILK OF MAGNESIA Take 30 mLs by mouth daily as needed for mild constipation.   mupirocin ointment 2 % Commonly known as:  BACTROBAN Place 1 application into the nose 2 (two) times daily.   nystatin powder Generic drug:  nystatin Apply 1 g topically 2 (two) times daily as needed (rash). Use 1 application twice daily as needed for rash   polyethylene glycol powder powder Commonly known as:  GLYCOLAX/MIRALAX FILL TO LINE (17 GRAMS ), MIX AND DRINK TWICE A DAY AND 8.5 GRAMS AT NOON   sennosides-docusate sodium 8.6-50 MG tablet Commonly known as:  SENOKOT-S Take 4 tablets by mouth every evening.   sodium chloride 0.65 % Soln nasal spray Commonly known as:  OCEAN Place 1-2 sprays into both nostrils 2 (two) times daily as needed for congestion.   traMADol 50 MG tablet Commonly known as:  ULTRAM Take 1 tablet (50 mg total) by mouth every 6 (six) hours as needed for moderate pain.   triamcinolone cream 0.1 % Commonly  known as:  KENALOG Apply 1 application topically daily.   triamcinolone cream 0.1 % Commonly known as:  KENALOG Apply 1 application topically daily as needed.   vitamin B-12 1000 MCG tablet Commonly known as:  CYANOCOBALAMIN Take 1,000 mcg by mouth daily.   vitamin C 500 MG tablet Commonly known as:  ASCORBIC ACID Take 500 mg by mouth daily.   XTANDI 40 MG capsule Generic drug:  enzalutamide TAKE 3 CAPSULES (120 MG TOTAL) BY MOUTH DAILY.     Relevant Imaging Results: Relevant Lab Results: Additional Information (SSN: [id number])  Arthor Gorter, Veronia Beets, LCSW

## 2017-05-08 ENCOUNTER — Other Ambulatory Visit: Payer: Self-pay

## 2017-05-08 ENCOUNTER — Telehealth: Payer: Self-pay | Admitting: Internal Medicine

## 2017-05-08 ENCOUNTER — Ambulatory Visit: Payer: Medicare Other | Admitting: Internal Medicine

## 2017-05-08 DIAGNOSIS — Z8781 Personal history of (healed) traumatic fracture: Secondary | ICD-10-CM | POA: Diagnosis not present

## 2017-05-08 DIAGNOSIS — I872 Venous insufficiency (chronic) (peripheral): Secondary | ICD-10-CM | POA: Diagnosis not present

## 2017-05-08 DIAGNOSIS — I700 Atherosclerosis of aorta: Secondary | ICD-10-CM | POA: Diagnosis not present

## 2017-05-08 DIAGNOSIS — Z79818 Long term (current) use of other agents affecting estrogen receptors and estrogen levels: Secondary | ICD-10-CM | POA: Diagnosis not present

## 2017-05-08 DIAGNOSIS — Z85828 Personal history of other malignant neoplasm of skin: Secondary | ICD-10-CM | POA: Diagnosis not present

## 2017-05-08 DIAGNOSIS — L03116 Cellulitis of left lower limb: Secondary | ICD-10-CM | POA: Diagnosis not present

## 2017-05-08 DIAGNOSIS — E538 Deficiency of other specified B group vitamins: Secondary | ICD-10-CM | POA: Diagnosis not present

## 2017-05-08 DIAGNOSIS — Z4800 Encounter for change or removal of nonsurgical wound dressing: Secondary | ICD-10-CM | POA: Diagnosis not present

## 2017-05-08 DIAGNOSIS — Z96653 Presence of artificial knee joint, bilateral: Secondary | ICD-10-CM | POA: Diagnosis not present

## 2017-05-08 DIAGNOSIS — M40203 Unspecified kyphosis, cervicothoracic region: Secondary | ICD-10-CM | POA: Diagnosis not present

## 2017-05-08 DIAGNOSIS — M810 Age-related osteoporosis without current pathological fracture: Secondary | ICD-10-CM | POA: Diagnosis not present

## 2017-05-08 DIAGNOSIS — K5909 Other constipation: Secondary | ICD-10-CM | POA: Diagnosis not present

## 2017-05-08 DIAGNOSIS — C61 Malignant neoplasm of prostate: Secondary | ICD-10-CM | POA: Diagnosis not present

## 2017-05-08 NOTE — Telephone Encounter (Signed)
Copied from [place]. Topic: Quick Communication - See Telephone Encounter >> May 08, 2017  3:12 PM Vernona Rieger wrote: CRM for notification. See Telephone encounter for:   05/08/17.  Katharine Look RN from [facility] called and needs verbals on start of care for twice a week for uno boot changes. Call back [phone]

## 2017-05-08 NOTE — Patient Outreach (Signed)
[facility]) Care Management  05/08/2017  Benjamin Villarreal 06-01-1920 [id number]  EMMI: General discharge red alert Referral date: 05/08/17 Referral reason: Scheduled follow up appointment: no Day # 1  Telephone call to patient regarding EMMI general discharge red alert. Unable to reach patient. HIPAA compliant voice message left with call back phone number.   PLAN: RNCM will attempt 2nd telephone call to patient in 4 business days.  RNCM will send patient outreach letter to attempt contact.   Quinn Plowman RN,BSN,CCM [facility] Telephonic  [phone]

## 2017-05-09 NOTE — Telephone Encounter (Signed)
Okay for this See if we can coordinate so that Benjamin Villarreal can check his legs with the UNNA boots off either next Wednesday or Friday

## 2017-05-10 LAB — CULTURE, BLOOD (ROUTINE X 2)
CULTURE: NO GROWTH
Culture: NO GROWTH
Special Requests: ADEQUATE
Special Requests: ADEQUATE

## 2017-05-11 ENCOUNTER — Telehealth: Payer: Self-pay | Admitting: Internal Medicine

## 2017-05-11 NOTE — Telephone Encounter (Signed)
Copied from [place]. Topic: Quick Communication - See Telephone Encounter >> May 11, 2017  1:59 PM Arletha Grippe wrote: CRM for notification. See Telephone encounter for:   05/11/17. [facility]  She need plan of care orders  She just wants to make sure it is 2 times a week  Cell [phone]

## 2017-05-11 NOTE — Telephone Encounter (Signed)
I will pass this on to [place]. She is out of the office today and possible tomorrow.

## 2017-05-11 NOTE — Telephone Encounter (Signed)
Spoke to [place] and advised her what Dr Silvio Pate said.

## 2017-05-11 NOTE — Telephone Encounter (Signed)
For the [facility] boots---that is what I heard from the hospital

## 2017-05-12 DIAGNOSIS — K5909 Other constipation: Secondary | ICD-10-CM | POA: Diagnosis not present

## 2017-05-12 DIAGNOSIS — C61 Malignant neoplasm of prostate: Secondary | ICD-10-CM | POA: Diagnosis not present

## 2017-05-12 DIAGNOSIS — L03116 Cellulitis of left lower limb: Secondary | ICD-10-CM | POA: Diagnosis not present

## 2017-05-12 DIAGNOSIS — I872 Venous insufficiency (chronic) (peripheral): Secondary | ICD-10-CM | POA: Diagnosis not present

## 2017-05-12 DIAGNOSIS — I700 Atherosclerosis of aorta: Secondary | ICD-10-CM | POA: Diagnosis not present

## 2017-05-12 DIAGNOSIS — M40203 Unspecified kyphosis, cervicothoracic region: Secondary | ICD-10-CM | POA: Diagnosis not present

## 2017-05-12 NOTE — Telephone Encounter (Signed)
I will contact Benjamin Villarreal about seeing him this week

## 2017-05-13 ENCOUNTER — Inpatient Hospital Stay (HOSPITAL_BASED_OUTPATIENT_CLINIC_OR_DEPARTMENT_OTHER): Payer: Medicare Other | Admitting: Internal Medicine

## 2017-05-13 ENCOUNTER — Encounter: Payer: Self-pay | Admitting: Internal Medicine

## 2017-05-13 ENCOUNTER — Other Ambulatory Visit: Payer: Self-pay

## 2017-05-13 ENCOUNTER — Inpatient Hospital Stay: Payer: Medicare Other | Attending: Internal Medicine

## 2017-05-13 VITALS — BP 118/60 | HR 60 | Temp 97.9°F | Resp 20 | Ht 74.0 in | Wt 173.0 lb

## 2017-05-13 DIAGNOSIS — E538 Deficiency of other specified B group vitamins: Secondary | ICD-10-CM | POA: Insufficient documentation

## 2017-05-13 DIAGNOSIS — Z79899 Other long term (current) drug therapy: Secondary | ICD-10-CM | POA: Insufficient documentation

## 2017-05-13 DIAGNOSIS — R000 Tachycardia, unspecified: Secondary | ICD-10-CM | POA: Diagnosis not present

## 2017-05-13 DIAGNOSIS — Z86010 Personal history of colonic polyps: Secondary | ICD-10-CM | POA: Diagnosis not present

## 2017-05-13 DIAGNOSIS — L03116 Cellulitis of left lower limb: Secondary | ICD-10-CM | POA: Diagnosis not present

## 2017-05-13 DIAGNOSIS — Z806 Family history of leukemia: Secondary | ICD-10-CM

## 2017-05-13 DIAGNOSIS — I872 Venous insufficiency (chronic) (peripheral): Secondary | ICD-10-CM | POA: Diagnosis not present

## 2017-05-13 DIAGNOSIS — Z7982 Long term (current) use of aspirin: Secondary | ICD-10-CM

## 2017-05-13 DIAGNOSIS — Z191 Hormone sensitive malignancy status: Secondary | ICD-10-CM | POA: Insufficient documentation

## 2017-05-13 DIAGNOSIS — K5909 Other constipation: Secondary | ICD-10-CM | POA: Diagnosis not present

## 2017-05-13 DIAGNOSIS — N1330 Unspecified hydronephrosis: Secondary | ICD-10-CM | POA: Insufficient documentation

## 2017-05-13 DIAGNOSIS — M40209 Unspecified kyphosis, site unspecified: Secondary | ICD-10-CM | POA: Insufficient documentation

## 2017-05-13 DIAGNOSIS — D509 Iron deficiency anemia, unspecified: Secondary | ICD-10-CM | POA: Insufficient documentation

## 2017-05-13 DIAGNOSIS — Z85828 Personal history of other malignant neoplasm of skin: Secondary | ICD-10-CM | POA: Insufficient documentation

## 2017-05-13 DIAGNOSIS — C61 Malignant neoplasm of prostate: Secondary | ICD-10-CM

## 2017-05-13 DIAGNOSIS — M40203 Unspecified kyphosis, cervicothoracic region: Secondary | ICD-10-CM | POA: Diagnosis not present

## 2017-05-13 DIAGNOSIS — Z9079 Acquired absence of other genital organ(s): Secondary | ICD-10-CM | POA: Insufficient documentation

## 2017-05-13 DIAGNOSIS — I700 Atherosclerosis of aorta: Secondary | ICD-10-CM | POA: Insufficient documentation

## 2017-05-13 DIAGNOSIS — M810 Age-related osteoporosis without current pathological fracture: Secondary | ICD-10-CM | POA: Insufficient documentation

## 2017-05-13 LAB — CBC WITH DIFFERENTIAL/PLATELET
BASOS PCT: 3 %
Basophils Absolute: 0.2 10*3/uL — ABNORMAL HIGH (ref 0–0.1)
Eosinophils Absolute: 0.4 10*3/uL (ref 0–0.7)
Eosinophils Relative: 6 %
HEMATOCRIT: 35.6 % — AB (ref 40.0–52.0)
Hemoglobin: 12 g/dL — ABNORMAL LOW (ref 13.0–18.0)
Lymphocytes Relative: 29 %
Lymphs Abs: 1.8 10*3/uL (ref 1.0–3.6)
MCH: 31.2 pg (ref 26.0–34.0)
MCHC: 33.8 g/dL (ref 32.0–36.0)
MCV: 92.4 fL (ref 80.0–100.0)
MONO ABS: 0.7 10*3/uL (ref 0.2–1.0)
MONOS PCT: 12 %
NEUTROS ABS: 3.1 10*3/uL (ref 1.4–6.5)
Neutrophils Relative %: 50 %
Platelets: 180 10*3/uL (ref 150–440)
RBC: 3.85 MIL/uL — ABNORMAL LOW (ref 4.40–5.90)
RDW: 14.1 % (ref 11.5–14.5)
WBC: 6.2 10*3/uL (ref 3.8–10.6)

## 2017-05-13 LAB — COMPREHENSIVE METABOLIC PANEL
ALBUMIN: 3.7 g/dL (ref 3.5–5.0)
ALT: 11 U/L — AB (ref 17–63)
ANION GAP: 8 (ref 5–15)
AST: 23 U/L (ref 15–41)
Alkaline Phosphatase: 55 U/L (ref 38–126)
BUN: 24 mg/dL — AB (ref 6–20)
CO2: 21 mmol/L — AB (ref 22–32)
Calcium: 9.2 mg/dL (ref 8.9–10.3)
Chloride: 106 mmol/L (ref 101–111)
Creatinine, Ser: 0.92 mg/dL (ref 0.61–1.24)
GFR calc Af Amer: >60 mL/min (ref >60)
GFR calc non Af Amer: >60 mL/min (ref >60)
GLUCOSE: 149 mg/dL — AB (ref 65–99)
Potassium: 4.5 mmol/L (ref 3.5–5.1)
SODIUM: 135 mmol/L (ref 135–145)
Total Bilirubin: 0.7 mg/dL (ref 0.3–1.2)
Total Protein: 7.3 g/dL (ref 6.5–8.1)

## 2017-05-13 LAB — PSA: Prostatic Specific Antigen: 2.84 ng/mL (ref 0.00–4.00)

## 2017-05-13 NOTE — Progress Notes (Signed)
OFFICE PROGRESS NOTE  Patient Care Team: Venia Carbon, MD as PCP - General   SUMMARY OF ONCOLOGIC HISTORY: Oncology History    # 1996 PROSTATE CANCER [Gleason score 2+3] s/p Radical Prostatectomy [[facility]]; ?Biochem RECURRENT PROSTATE CANCER-Hormone sensitive;  Intermittent Lupron; on Lupron q 90M [Jan 2016 PSA- 7.9].  HOLD LUPRON in MAY 2017; AUg 2017- Re-start  # OCT 2018- Xtandi 3 pills a day; OCT 30th PET- Retroperitoneal/pelvic adenopathy/recurrence lateral bladder wall; NO Bone lesions.   # Osteoporosis [BMD- 2297]- on Reclast q 59M; FEB 2017- Start Prolia q 6 M     Prostate cancer ([place])    INTERVAL HISTORY:  A very pleasant 84 year old male patient with above history of  Castrate resistant recurrent prostate cancer- on Lupron; and also [name] [since October 2018] is here for follow-up.  Accompanied his son.   patient is currently on Xtandi. He is taking 3 pills once a day.  No unusual fatigue no falls.  No swelling of the legs.  Patient denies any new onset of abdominal pain vomiting or diarrhea.  Denies any seizure-like activity.  REVIEW OF SYSTEMS:  A complete 10 point review of system is done which is negative except mentioned above/history of present illness.   PAST MEDICAL HISTORY :  Past Medical History:  Diagnosis Date  . Aortic atherosclerosis ([place])    Noted on CT  . Arthritis   . Colon polyps    adenomatous  . History of fracture of tibia   . History of tachycardia    patient unaware of this  . Kyphosis of cervicothoracic region    SEVERE  . Osteoporosis 01/2014   T -3.8 (01/2014)  . Other constipation    chronic  . Prostate cancer ([place])    surgery then lupron  . Unspecified venous (peripheral) insufficiency   . Vitamin B12 deficiency     PAST SURGICAL HISTORY :   Past Surgical History:  Procedure Laterality Date  . BASAL CELL CARCINOMA EXCISION  2004   Left side of face  . CATARACT EXTRACTION Bilateral 2000, 2003   both eyes  . COLONOSCOPY      h/o adenomatous polyps  . CYSTOSCOPY W/ RETROGRADES Bilateral 11/19/2016   Procedure: CYSTOSCOPY WITH RETROGRADE PYELOGRAM;  Surgeon: Hollice Espy, MD;  Location: [facility];  Service: Urology;  Laterality: Bilateral;  General vs. Spinal  . CYSTOSCOPY W/ URETERAL STENT PLACEMENT Left 02/25/2017   Procedure: CYSTOSCOPY WITH STENT REPLACEMENT;  Surgeon: Hollice Espy, MD;  Location: [facility];  Service: Urology;  Laterality: Left;  . CYSTOSCOPY WITH URETEROSCOPY AND STENT PLACEMENT Left 11/19/2016   Procedure: CYSTOSCOPY WITH URETEROSCOPY AND STENT PLACEMENT;  Surgeon: Hollice Espy, MD;  Location: [facility];  Service: Urology;  Laterality: Left;  . FRACTURE SURGERY    . JOINT REPLACEMENT  2005   Left knee  . JOINT REPLACEMENT  12/13   Right knee  . PROSTATE SURGERY  1996   cancer  . sigmoid resection    . TIBIA FRACTURE SURGERY  1999  . URETERAL BIOPSY Left 11/19/2016   Procedure: URETERAL BIOPSY;  Surgeon: Hollice Espy, MD;  Location: [facility];  Service: Urology;  Laterality: Left;  Marland Kitchen VOLVULUS REDUCTION  12/10    FAMILY HISTORY :   Family History  Problem Relation Age of Onset  . Leukemia Mother   . Diabetes Son   . Prostate cancer Neg Hx   . Bladder Cancer Neg Hx   . Kidney cancer Neg Hx     SOCIAL HISTORY:  Social History   Tobacco Use  . Smoking status: Never Smoker  . Smokeless tobacco: Never Used  Substance Use Topics  . Alcohol use: No    Alcohol/week: 0.0 - 0.6 oz    Frequency: Never    Comment: in past  . Drug use: No    ALLERGIES:  is allergic to other.  MEDICATIONS:  Current Outpatient Medications  Medication Sig Dispense Refill  . acetaminophen (TYLENOL) 325 MG tablet Take 325-650 mg by mouth every 4 (four) hours as needed for fever (general discomfort).     . latanoprost (XALATAN) 0.005 % ophthalmic solution Place 1 drop into both eyes at bedtime.    Marland Kitchen loratadine (CLARITIN) 10 MG tablet Take 20 mg by mouth at bedtime.    . magnesium hydroxide  (MILK OF MAGNESIA) 400 MG/5ML suspension Take 30 mLs by mouth daily as needed for mild constipation.    . mupirocin ointment (BACTROBAN) 2 % Place 1 application into the nose 2 (two) times daily. 22 g 0  . nystatin (NYSTATIN) powder Apply 1 g topically 2 (two) times daily as needed (rash). Use 1 application twice daily as needed for rash    . polyethylene glycol powder (GLYCOLAX/MIRALAX) powder FILL TO LINE (17 GRAMS ), MIX AND DRINK TWICE A DAY AND 8.5 GRAMS AT NOON 510 g 3  . sennosides-docusate sodium (SENOKOT-S) 8.6-50 MG tablet Take 4 tablets by mouth every evening.     . Skin Protectants, Misc. (EUCERIN) cream Apply 1 application topically daily.    Marland Kitchen triamcinolone cream (KENALOG) 0.1 % Apply 1 application topically daily.    . vitamin B-12 (CYANOCOBALAMIN) 1000 MCG tablet Take 1,000 mcg by mouth daily.    . vitamin C (ASCORBIC ACID) 500 MG tablet Take 500 mg by mouth daily.    [name] 40 MG capsule TAKE 3 CAPSULES (120 MG TOTAL) BY MOUTH DAILY. 90 capsule 4  . alum & mag hydroxide-simeth (MAALOX/MYLANTA) 200-200-20 MG/5ML suspension Take 30 mLs by mouth every 4 (four) hours as needed for indigestion or heartburn.    Marland Kitchen aspirin EC 81 MG tablet Take 81 mg by mouth every other day.    . Biotin 2.5 MG CAPS Take 2.5 mg by mouth daily.     Marland Kitchen bismuth subsalicylate (KAOPECTATE) 262 MG/15ML suspension Take 10 mLs by mouth 3 (three) times daily as needed for diarrhea or loose stools.     . Calcium Carbonate-Vitamin D (CALCIUM-D) 600-400 MG-UNIT TABS Take 1 tablet by mouth daily.     . carbamide peroxide (DEBROX) 6.5 % OTIC solution 5 drops daily as needed.    . cholecalciferol (VITAMIN D) 1000 UNITS tablet Take 1,000 Units by mouth 2 (two) times daily.     . cyclobenzaprine (FLEXERIL) 5 MG tablet Take 5 mg by mouth at bedtime as needed for muscle spasms.    Marland Kitchen dextromethorphan-guaiFENesin (ROBITUSSIN-DM) 10-100 MG/5ML liquid Take 10 mLs by mouth every 4 (four) hours as needed for cough.    .  diphenhydrAMINE (BENADRYL) 25 mg capsule Take 25 mg by mouth every 4 (four) hours as needed for itching or allergies.    . fluticasone (FLONASE) 50 MCG/ACT nasal spray Place 1 spray into both nostrils daily.    Marland Kitchen HYDROcodone-acetaminophen (NORCO/VICODIN) 5-325 MG tablet Take 1 tablet by mouth every 6 (six) hours as needed for moderate pain. 30 tablet 0  . ibuprofen (ADVIL,MOTRIN) 200 MG tablet Take 400 mg by mouth 2 (two) times daily as needed for headache or mild pain.     Marland Kitchen  sodium chloride (OCEAN) 0.65 % SOLN nasal spray Place 1-2 sprays into both nostrils 2 (two) times daily as needed for congestion.    . traMADol (ULTRAM) 50 MG tablet Take 1 tablet (50 mg total) by mouth every 6 (six) hours as needed for moderate pain. 30 tablet 0  . triamcinolone cream (KENALOG) 0.1 % Apply 1 application topically daily as needed.      No current facility-administered medications for this visit.    Facility-Administered Medications Ordered in Other Visits  Medication Dose Route Frequency Provider Last Rate Last Dose  . leuprolide (LUPRON) injection 30 mg  30 mg Intramuscular Once Leia Alf, MD        PHYSICAL EXAMINATION: ECOG PERFORMANCE STATUS: 1 - Symptomatic but completely ambulatory  BP 118/60 (BP Location: Left Arm, Patient Position: Sitting) Comment (BP Location): manual blood pressure  Pulse 60   Temp 97.9 F (36.6 C) (Tympanic)   Resp 20   Ht 6\' 2"  (1.88 m)   Wt 173 lb (78.5 kg)   BMI 22.21 kg/m   Filed Weights   05/13/17 1148  Weight: 173 lb (78.5 kg)    GENERAL: Well-nourished well-developed; Alert, no distress and comfortable.  With his son.  Walks with a rolling walker EYES: no pallor or icterus OROPHARYNX: no thrush or ulceration; good dentition  NECK: supple, no masses felt LYMPH:  no palpable lymphadenopathy in the cervical, axillary or inguinal regions LUNGS: clear to auscultation and  No wheeze or crackles HEART/CVS: regular rate & rhythm and no murmurs; No lower  extremity edema ABDOMEN:abdomen soft, non-tender and normal bowel sounds Musculoskeletal:no cyanosis of digits and no clubbing  PSYCH: alert & oriented x 3 with fluent speech NEURO: no focal motor/sensory deficits SKIN:  no rashes or significant lesions  LABORATORY DATA:  I have reviewed the data as listed    Component Value Date/Time   NA 135 05/13/2017 1042   NA 140 02/11/2012 0415   K 4.5 05/13/2017 1042   K 3.9 02/11/2012 0415   CL 106 05/13/2017 1042   CL 108 (H) 02/11/2012 0415   CO2 21 (L) 05/13/2017 1042   CO2 25 02/11/2012 0415   GLUCOSE 149 (H) 05/13/2017 1042   GLUCOSE 125 (H) 02/11/2012 0415   BUN 24 (H) 05/13/2017 1042   BUN 12 02/11/2012 0415   CREATININE 0.92 05/13/2017 1042   CREATININE 1.00 03/23/2014 1400   CALCIUM 9.2 05/13/2017 1042   CALCIUM 8.3 (L) 03/23/2014 1400   PROT 7.3 05/13/2017 1042   PROT 7.0 03/07/2014 1414   ALBUMIN 3.7 05/13/2017 1042   ALBUMIN 3.7 03/07/2014 1414   AST 23 05/13/2017 1042   AST 16 03/07/2014 1414   ALT 11 (L) 05/13/2017 1042   ALT 19 03/07/2014 1414   ALKPHOS 55 05/13/2017 1042   ALKPHOS 69 03/07/2014 1414   BILITOT 0.7 05/13/2017 1042   BILITOT 0.3 03/07/2014 1414   GFRNONAA >60 05/13/2017 1042   GFRNONAA >60 03/23/2014 1400   GFRNONAA 49 (L) 09/02/2013 1428   GFRAA >60 05/13/2017 1042   GFRAA >60 03/23/2014 1400   GFRAA 57 (L) 09/02/2013 1428    No results found for: SPEP, UPEP  Lab Results  Component Value Date   WBC 6.2 05/13/2017   NEUTROABS 3.1 05/13/2017   HGB 12.0 (L) 05/13/2017   HCT 35.6 (L) 05/13/2017   MCV 92.4 05/13/2017   PLT 180 05/13/2017      Chemistry      Component Value Date/Time   NA 135 05/13/2017  1042   NA 140 02/11/2012 0415   K 4.5 05/13/2017 1042   K 3.9 02/11/2012 0415   CL 106 05/13/2017 1042   CL 108 (H) 02/11/2012 0415   CO2 21 (L) 05/13/2017 1042   CO2 25 02/11/2012 0415   BUN 24 (H) 05/13/2017 1042   BUN 12 02/11/2012 0415   CREATININE 0.92 05/13/2017 1042    CREATININE 1.00 03/23/2014 1400   GLU 114 06/30/2016      Component Value Date/Time   CALCIUM 9.2 05/13/2017 1042   CALCIUM 8.3 (L) 03/23/2014 1400   ALKPHOS 55 05/13/2017 1042   ALKPHOS 69 03/07/2014 1414   AST 23 05/13/2017 1042   AST 16 03/07/2014 1414   ALT 11 (L) 05/13/2017 1042   ALT 19 03/07/2014 1414   BILITOT 0.7 05/13/2017 1042   BILITOT 0.3 03/07/2014 1414     Results for PERFECTO, PURDY (MRN [id number]) as of 05/13/2017 12:14  Ref. Range 04/04/2016 09:56 07/02/2016 13:44 10/03/2016 13:41 11/03/2016 09:49 12/01/2016 14:12 12/29/2016 09:38 02/09/2017 13:55 04/01/2017 11:01  PSA Latest Ref Range: 0.00 - 4.00 ng/mL 2.23 3.98        Prostatic Specific Antigen Latest Ref Range: 0.00 - 4.00 ng/mL   5.37 (H) 8.02 (H) 8.99 (H) 2.53 3.68 3.72     IMPRESSION: 1. Radiotracer accumulation with in LEFT periaortic lymph nodes and LEFT common iliac lymph nodes consistent with prostate cancer metastasis. 2. Focal activity along the posterior LEFT aspect of the bladder is concerning for local prostate cancer invasion. 3. No evidence skeletal metastasis.   Electronically Signed   By: Suzy Bouchard M.D.   On: 12/23/2016 15:20    ASSESSMENT & PLAN:   Prostate cancer ([place]) # #CASTRATE RESISTANT PROSTATE CANCER- Metastatic disease with left bladder invasion/obstruction. OCT 30th PET scan-  Pelvic/RP LN; left later bladder wall- possibly leading to left ureter obstruction/hydronephrosis [C discussion below]  # Continue X-tandi [3 pills/day]  3.68/improved.  tolerating well except for mild fatigue. PSA-pending today. Lupron every 3 months [ 04/01/2017]  # Left hydronephrosis/? Extrinsic mass pushing on Left ureters s/p stenting exchange in  Jan 2019.  Hold off repeat imaging at this time unless patient's PSA significantly elevated or renal function is deteriorating.  # Severe Osteoporosis-/Castrate resistant prostate cancer [no bone mets] recommend prolia every 6 months [04/01/2017]; given his  age.    # Iron deficiency anemia- question related to urinary blood loss. By mouth iron tolerating well. Hemoglobin 11.8.   # follow up in 6 weeks/labs- cbc/cmp/psa; Lupron.      Cammie Sickle, MD 05/24/2017 3:58 PM

## 2017-05-13 NOTE — Assessment & Plan Note (Addendum)
# #  CASTRATE RESISTANT PROSTATE CANCER- Metastatic disease with left bladder invasion/obstruction. OCT 30th PET scan-  Pelvic/RP LN; left later bladder wall- possibly leading to left ureter obstruction/hydronephrosis [C discussion below]  # Continue X-tandi [3 pills/day]  3.68/improved.  tolerating well except for mild fatigue. PSA-pending today. Lupron every 3 months [ 04/01/2017]  # Left hydronephrosis/? Extrinsic mass pushing on Left ureters s/p stenting exchange in  Jan 2019.  Hold off repeat imaging at this time unless patient's PSA significantly elevated or renal function is deteriorating.  # Severe Osteoporosis-/Castrate resistant prostate cancer [no bone mets] recommend prolia every 6 months [04/01/2017]; given his age.    # Iron deficiency anemia- question related to urinary blood loss. By mouth iron tolerating well. Hemoglobin 11.8.   # follow up in 6 weeks/labs- cbc/cmp/psa; Lupron.

## 2017-05-14 ENCOUNTER — Ambulatory Visit: Payer: Self-pay

## 2017-05-15 DIAGNOSIS — L03119 Cellulitis of unspecified part of limb: Secondary | ICD-10-CM | POA: Diagnosis not present

## 2017-05-18 ENCOUNTER — Other Ambulatory Visit: Payer: Self-pay

## 2017-05-18 ENCOUNTER — Telehealth: Payer: Self-pay | Admitting: *Deleted

## 2017-05-18 DIAGNOSIS — K5909 Other constipation: Secondary | ICD-10-CM | POA: Diagnosis not present

## 2017-05-18 DIAGNOSIS — C61 Malignant neoplasm of prostate: Secondary | ICD-10-CM | POA: Diagnosis not present

## 2017-05-18 DIAGNOSIS — I872 Venous insufficiency (chronic) (peripheral): Secondary | ICD-10-CM | POA: Diagnosis not present

## 2017-05-18 DIAGNOSIS — L03116 Cellulitis of left lower limb: Secondary | ICD-10-CM | POA: Diagnosis not present

## 2017-05-18 DIAGNOSIS — M40203 Unspecified kyphosis, cervicothoracic region: Secondary | ICD-10-CM | POA: Diagnosis not present

## 2017-05-18 DIAGNOSIS — I700 Atherosclerosis of aorta: Secondary | ICD-10-CM | POA: Diagnosis not present

## 2017-05-18 NOTE — Patient Outreach (Signed)
[facility]) Care Management  05/18/2017  Benjamin Villarreal 1920/08/15 [id number]  EMMI: general discharge red alert Referral date: 05/08/17 Referral reason: scheduled follow up Day # 1  Telephone call to patient regarding EMMI general discharge red alert. HIPAA verified by patient. Discussed EMMI general discharge follow up with patient. Patient states he saw his primary MD after discharge from the hospital and he has seen the oncologist and Dr. Ola Spurr, infection disease doctor due to his left leg infection. Patient states his main concern is that his knee that he previouly had surgery on does not get more infected and the doctor has to amputate.  Patient reports the nurse gives him his medications including his antibiotic which he is still taking. Patient states he has a another follow up scheduled with the infection disease doctor.  Unsure of specific date.  States it is written in his calendar.  Patient states he left leg gets wrapped. Patient denies having any new symptoms. States he is pleased with the care he gets at [facility]. Patient denies any further needs at this time.  Denies need for additional nursing services.    PLAN: RNCM will close patient due to patient being assessed and having no further needs.  Quinn Plowman RN,BSN,CCM [facility] Telephonic  [phone]

## 2017-05-18 NOTE — Telephone Encounter (Signed)
spoke with patient's daughter- pt currently not available. Daughter answered patient's cell #.  Results provided to daughter.

## 2017-05-18 NOTE — Telephone Encounter (Signed)
-----   Message from Cammie Sickle, MD sent at 05/18/2017  7:47 AM EDT ----- Please inform patient that PSA is improving at 2.84.  Continue follow-up as planned.

## 2017-05-21 DIAGNOSIS — L03116 Cellulitis of left lower limb: Secondary | ICD-10-CM | POA: Diagnosis not present

## 2017-05-21 DIAGNOSIS — I872 Venous insufficiency (chronic) (peripheral): Secondary | ICD-10-CM | POA: Diagnosis not present

## 2017-05-21 DIAGNOSIS — I700 Atherosclerosis of aorta: Secondary | ICD-10-CM | POA: Diagnosis not present

## 2017-05-21 DIAGNOSIS — M40203 Unspecified kyphosis, cervicothoracic region: Secondary | ICD-10-CM | POA: Diagnosis not present

## 2017-05-21 DIAGNOSIS — K5909 Other constipation: Secondary | ICD-10-CM | POA: Diagnosis not present

## 2017-05-21 DIAGNOSIS — C61 Malignant neoplasm of prostate: Secondary | ICD-10-CM | POA: Diagnosis not present

## 2017-05-25 DIAGNOSIS — C61 Malignant neoplasm of prostate: Secondary | ICD-10-CM | POA: Diagnosis not present

## 2017-05-25 DIAGNOSIS — I700 Atherosclerosis of aorta: Secondary | ICD-10-CM | POA: Diagnosis not present

## 2017-05-25 DIAGNOSIS — K5909 Other constipation: Secondary | ICD-10-CM | POA: Diagnosis not present

## 2017-05-25 DIAGNOSIS — I872 Venous insufficiency (chronic) (peripheral): Secondary | ICD-10-CM | POA: Diagnosis not present

## 2017-05-25 DIAGNOSIS — L03116 Cellulitis of left lower limb: Secondary | ICD-10-CM | POA: Diagnosis not present

## 2017-05-25 DIAGNOSIS — M40203 Unspecified kyphosis, cervicothoracic region: Secondary | ICD-10-CM | POA: Diagnosis not present

## 2017-05-28 DIAGNOSIS — L03116 Cellulitis of left lower limb: Secondary | ICD-10-CM | POA: Diagnosis not present

## 2017-05-28 DIAGNOSIS — C61 Malignant neoplasm of prostate: Secondary | ICD-10-CM | POA: Diagnosis not present

## 2017-05-28 DIAGNOSIS — I700 Atherosclerosis of aorta: Secondary | ICD-10-CM | POA: Diagnosis not present

## 2017-05-28 DIAGNOSIS — M40203 Unspecified kyphosis, cervicothoracic region: Secondary | ICD-10-CM | POA: Diagnosis not present

## 2017-05-28 DIAGNOSIS — I872 Venous insufficiency (chronic) (peripheral): Secondary | ICD-10-CM | POA: Diagnosis not present

## 2017-05-28 DIAGNOSIS — K5909 Other constipation: Secondary | ICD-10-CM | POA: Diagnosis not present

## 2017-05-28 MED FILL — XTANDI 40 MG CAPSULE: 40 | 30 days supply | Qty: 90 | Fill #1

## 2017-06-01 DIAGNOSIS — K5909 Other constipation: Secondary | ICD-10-CM | POA: Diagnosis not present

## 2017-06-01 DIAGNOSIS — L03116 Cellulitis of left lower limb: Secondary | ICD-10-CM | POA: Diagnosis not present

## 2017-06-01 DIAGNOSIS — C61 Malignant neoplasm of prostate: Secondary | ICD-10-CM | POA: Diagnosis not present

## 2017-06-01 DIAGNOSIS — I872 Venous insufficiency (chronic) (peripheral): Secondary | ICD-10-CM | POA: Diagnosis not present

## 2017-06-01 DIAGNOSIS — I700 Atherosclerosis of aorta: Secondary | ICD-10-CM | POA: Diagnosis not present

## 2017-06-01 DIAGNOSIS — M40203 Unspecified kyphosis, cervicothoracic region: Secondary | ICD-10-CM | POA: Diagnosis not present

## 2017-06-03 DIAGNOSIS — L03116 Cellulitis of left lower limb: Secondary | ICD-10-CM | POA: Diagnosis not present

## 2017-06-03 DIAGNOSIS — L820 Inflamed seborrheic keratosis: Secondary | ICD-10-CM | POA: Diagnosis not present

## 2017-06-03 DIAGNOSIS — I872 Venous insufficiency (chronic) (peripheral): Secondary | ICD-10-CM | POA: Diagnosis not present

## 2017-06-03 DIAGNOSIS — L821 Other seborrheic keratosis: Secondary | ICD-10-CM | POA: Diagnosis not present

## 2017-06-04 DIAGNOSIS — I872 Venous insufficiency (chronic) (peripheral): Secondary | ICD-10-CM | POA: Diagnosis not present

## 2017-06-04 DIAGNOSIS — C61 Malignant neoplasm of prostate: Secondary | ICD-10-CM | POA: Diagnosis not present

## 2017-06-04 DIAGNOSIS — L03116 Cellulitis of left lower limb: Secondary | ICD-10-CM | POA: Diagnosis not present

## 2017-06-04 DIAGNOSIS — K5909 Other constipation: Secondary | ICD-10-CM | POA: Diagnosis not present

## 2017-06-04 DIAGNOSIS — I700 Atherosclerosis of aorta: Secondary | ICD-10-CM | POA: Diagnosis not present

## 2017-06-04 DIAGNOSIS — L03119 Cellulitis of unspecified part of limb: Secondary | ICD-10-CM | POA: Diagnosis not present

## 2017-06-04 DIAGNOSIS — M40203 Unspecified kyphosis, cervicothoracic region: Secondary | ICD-10-CM | POA: Diagnosis not present

## 2017-06-05 DIAGNOSIS — I872 Venous insufficiency (chronic) (peripheral): Secondary | ICD-10-CM | POA: Diagnosis not present

## 2017-06-05 DIAGNOSIS — C61 Malignant neoplasm of prostate: Secondary | ICD-10-CM | POA: Diagnosis not present

## 2017-06-05 DIAGNOSIS — K5909 Other constipation: Secondary | ICD-10-CM | POA: Diagnosis not present

## 2017-06-05 DIAGNOSIS — I700 Atherosclerosis of aorta: Secondary | ICD-10-CM | POA: Diagnosis not present

## 2017-06-05 DIAGNOSIS — M40203 Unspecified kyphosis, cervicothoracic region: Secondary | ICD-10-CM | POA: Diagnosis not present

## 2017-06-05 DIAGNOSIS — L03116 Cellulitis of left lower limb: Secondary | ICD-10-CM | POA: Diagnosis not present

## 2017-06-08 DIAGNOSIS — H401131 Primary open-angle glaucoma, bilateral, mild stage: Secondary | ICD-10-CM | POA: Diagnosis not present

## 2017-06-09 DIAGNOSIS — M40203 Unspecified kyphosis, cervicothoracic region: Secondary | ICD-10-CM | POA: Diagnosis not present

## 2017-06-09 DIAGNOSIS — C61 Malignant neoplasm of prostate: Secondary | ICD-10-CM | POA: Diagnosis not present

## 2017-06-09 DIAGNOSIS — K5909 Other constipation: Secondary | ICD-10-CM | POA: Diagnosis not present

## 2017-06-09 DIAGNOSIS — I872 Venous insufficiency (chronic) (peripheral): Secondary | ICD-10-CM | POA: Diagnosis not present

## 2017-06-09 DIAGNOSIS — I700 Atherosclerosis of aorta: Secondary | ICD-10-CM | POA: Diagnosis not present

## 2017-06-09 DIAGNOSIS — L03116 Cellulitis of left lower limb: Secondary | ICD-10-CM | POA: Diagnosis not present

## 2017-06-12 DIAGNOSIS — C61 Malignant neoplasm of prostate: Secondary | ICD-10-CM | POA: Diagnosis not present

## 2017-06-12 DIAGNOSIS — I872 Venous insufficiency (chronic) (peripheral): Secondary | ICD-10-CM | POA: Diagnosis not present

## 2017-06-12 DIAGNOSIS — M40203 Unspecified kyphosis, cervicothoracic region: Secondary | ICD-10-CM | POA: Diagnosis not present

## 2017-06-12 DIAGNOSIS — K5909 Other constipation: Secondary | ICD-10-CM | POA: Diagnosis not present

## 2017-06-12 DIAGNOSIS — L03116 Cellulitis of left lower limb: Secondary | ICD-10-CM | POA: Diagnosis not present

## 2017-06-12 DIAGNOSIS — I700 Atherosclerosis of aorta: Secondary | ICD-10-CM | POA: Diagnosis not present

## 2017-06-15 ENCOUNTER — Encounter (INDEPENDENT_AMBULATORY_CARE_PROVIDER_SITE_OTHER): Payer: Self-pay | Admitting: Vascular Surgery

## 2017-06-15 ENCOUNTER — Ambulatory Visit (INDEPENDENT_AMBULATORY_CARE_PROVIDER_SITE_OTHER): Payer: Medicare Other | Admitting: Vascular Surgery

## 2017-06-15 VITALS — BP 117/63 | HR 60 | Resp 16 | Ht 74.0 in | Wt 180.0 lb

## 2017-06-15 DIAGNOSIS — I872 Venous insufficiency (chronic) (peripheral): Secondary | ICD-10-CM

## 2017-06-15 DIAGNOSIS — I890 Lymphedema, not elsewhere classified: Secondary | ICD-10-CM | POA: Diagnosis not present

## 2017-06-15 DIAGNOSIS — I700 Atherosclerosis of aorta: Secondary | ICD-10-CM | POA: Diagnosis not present

## 2017-06-15 DIAGNOSIS — M150 Primary generalized (osteo)arthritis: Secondary | ICD-10-CM | POA: Diagnosis not present

## 2017-06-15 DIAGNOSIS — M159 Polyosteoarthritis, unspecified: Secondary | ICD-10-CM

## 2017-06-16 ENCOUNTER — Encounter (INDEPENDENT_AMBULATORY_CARE_PROVIDER_SITE_OTHER): Payer: Self-pay | Admitting: Vascular Surgery

## 2017-06-16 DIAGNOSIS — K5909 Other constipation: Secondary | ICD-10-CM | POA: Diagnosis not present

## 2017-06-16 DIAGNOSIS — I700 Atherosclerosis of aorta: Secondary | ICD-10-CM | POA: Diagnosis not present

## 2017-06-16 DIAGNOSIS — I872 Venous insufficiency (chronic) (peripheral): Secondary | ICD-10-CM | POA: Diagnosis not present

## 2017-06-16 DIAGNOSIS — L03116 Cellulitis of left lower limb: Secondary | ICD-10-CM | POA: Diagnosis not present

## 2017-06-16 DIAGNOSIS — I890 Lymphedema, not elsewhere classified: Secondary | ICD-10-CM | POA: Insufficient documentation

## 2017-06-16 DIAGNOSIS — M40203 Unspecified kyphosis, cervicothoracic region: Secondary | ICD-10-CM | POA: Diagnosis not present

## 2017-06-16 DIAGNOSIS — C61 Malignant neoplasm of prostate: Secondary | ICD-10-CM | POA: Diagnosis not present

## 2017-06-16 NOTE — Progress Notes (Signed)
MRN : [id number]  Benjamin Villarreal is a 84 y.o. (23-Nov-1920) male who presents with chief complaint of  Chief Complaint  Patient presents with  . New Patient (Initial Visit)    ref Ronnald Ramp for Left le venous insufficiency  .  History of Present Illness:   Patient is seen for evaluation of leg swelling. The patient first noticed the swelling remotely but is now concerned because of a significant increase in the overall edema. The swelling is associated with pain and discoloration. The patient notes that in the morning the legs are significantly improved but they steadily worsened throughout the course of the day. Elevation makes the legs better, dependency makes them much worse.  Of note he was recently admitted twice to [facility] for treatment of left lower extremity cellulitis  There is a history of ulcerations associated with the swelling but at the present time he appears to have healed his wounds.   The patient denies any recent changes in their medications.  The patient had been wearing mild graduated compression before the episode of cellulitis.  Since his infection he has been placed in a left Unna boot and has done well with this.  The patient has no had any past angiography, interventions or vascular surgery.  The patient denies a history of DVT or PE. There is no prior history of phlebitis. There is no history of primary lymphedema.  There is no history of radiation treatment to the groin or pelvis No history of malignancies. No history of trauma or groin or pelvic surgery. No history of foreign travel or parasitic infections area    Current Meds  Medication Sig  . acetaminophen (TYLENOL) 325 MG tablet Take 325-650 mg by mouth every 4 (four) hours as needed for fever (general discomfort).   Marland Kitchen alum & mag hydroxide-simeth (MAALOX/MYLANTA) 200-200-20 MG/5ML suspension Take 30 mLs by mouth every 4 (four) hours as needed for indigestion or heartburn.  Marland Kitchen aspirin EC 81 MG  tablet Take 81 mg by mouth every other day.  . Biotin 2.5 MG CAPS Take 2.5 mg by mouth daily.   Marland Kitchen bismuth subsalicylate (KAOPECTATE) 262 MG/15ML suspension Take 10 mLs by mouth 3 (three) times daily as needed for diarrhea or loose stools.   . Calcium Carbonate-Vitamin D (CALCIUM-D) 600-400 MG-UNIT TABS Take 1 tablet by mouth daily.   . carbamide peroxide (DEBROX) 6.5 % OTIC solution 5 drops daily as needed.  . cholecalciferol (VITAMIN D) 1000 UNITS tablet Take 1,000 Units by mouth 2 (two) times daily.   . cyclobenzaprine (FLEXERIL) 5 MG tablet Take 5 mg by mouth at bedtime as needed for muscle spasms.  Marland Kitchen dextromethorphan-guaiFENesin (ROBITUSSIN-DM) 10-100 MG/5ML liquid Take 10 mLs by mouth every 4 (four) hours as needed for cough.  . diphenhydrAMINE (BENADRYL) 25 mg capsule Take 25 mg by mouth every 4 (four) hours as needed for itching or allergies.  Marland Kitchen doxycycline (VIBRA-TABS) 100 MG tablet   . fluPHENAZine decanoate (PROLIXIN) 25 MG/ML injection Inject into the muscle.  . fluticasone (FLONASE) 50 MCG/ACT nasal spray Place 1 spray into both nostrils daily.  Marland Kitchen HYDROcodone-acetaminophen (NORCO/VICODIN) 5-325 MG tablet Take 1 tablet by mouth every 6 (six) hours as needed for moderate pain.  Marland Kitchen ibuprofen (ADVIL,MOTRIN) 200 MG tablet Take 400 mg by mouth 2 (two) times daily as needed for headache or mild pain.   Marland Kitchen latanoprost (XALATAN) 0.005 % ophthalmic solution Place 1 drop into both eyes at bedtime.  Marland Kitchen loratadine (CLARITIN) 10 MG tablet Take  20 mg by mouth at bedtime.  . magnesium hydroxide (MILK OF MAGNESIA) 400 MG/5ML suspension Take 30 mLs by mouth daily as needed for mild constipation.  . mupirocin ointment (BACTROBAN) 2 % Place 1 application into the nose 2 (two) times daily.  Marland Kitchen nystatin (NYSTATIN) powder Apply 1 g topically 2 (two) times daily as needed (rash). Use 1 application twice daily as needed for rash  . polyethylene glycol powder (GLYCOLAX/MIRALAX) powder FILL TO LINE (17 GRAMS ), MIX  AND DRINK TWICE A DAY AND 8.5 GRAMS AT NOON  . sennosides-docusate sodium (SENOKOT-S) 8.6-50 MG tablet Take 4 tablets by mouth every evening.   . Skin Protectants, Misc. (EUCERIN) cream Apply 1 application topically daily.  . sodium chloride (OCEAN) 0.65 % SOLN nasal spray Place 1-2 sprays into both nostrils 2 (two) times daily as needed for congestion.  . traMADol (ULTRAM) 50 MG tablet Take 1 tablet (50 mg total) by mouth every 6 (six) hours as needed for moderate pain.  Marland Kitchen triamcinolone cream (KENALOG) 0.1 % Apply 1 application topically daily as needed.   . triamcinolone cream (KENALOG) 0.1 % Apply 1 application topically daily.  . vitamin B-12 (CYANOCOBALAMIN) 1000 MCG tablet Take 1,000 mcg by mouth daily.  . vitamin C (ASCORBIC ACID) 500 MG tablet Take 500 mg by mouth daily.  [name] 40 MG capsule TAKE 3 CAPSULES (120 MG TOTAL) BY MOUTH DAILY.    Past Medical History:  Diagnosis Date  . Aortic atherosclerosis ([place])    Noted on CT  . Arthritis   . Colon polyps    adenomatous  . History of fracture of tibia   . History of tachycardia    patient unaware of this  . Kyphosis of cervicothoracic region    SEVERE  . Osteoporosis 01/2014   T -3.8 (01/2014)  . Other constipation    chronic  . Prostate cancer ([place])    surgery then lupron  . Unspecified venous (peripheral) insufficiency   . Vitamin B12 deficiency     Past Surgical History:  Procedure Laterality Date  . BASAL CELL CARCINOMA EXCISION  2004   Left side of face  . CATARACT EXTRACTION Bilateral 2000, 2003   both eyes  . COLONOSCOPY     h/o adenomatous polyps  . CYSTOSCOPY W/ RETROGRADES Bilateral 11/19/2016   Procedure: CYSTOSCOPY WITH RETROGRADE PYELOGRAM;  Surgeon: Hollice Espy, MD;  Location: [facility] ORS;  Service: Urology;  Laterality: Bilateral;  General vs. Spinal  . CYSTOSCOPY W/ URETERAL STENT PLACEMENT Left 02/25/2017   Procedure: CYSTOSCOPY WITH STENT REPLACEMENT;  Surgeon: Hollice Espy, MD;  Location: [facility]  ORS;  Service: Urology;  Laterality: Left;  . CYSTOSCOPY WITH URETEROSCOPY AND STENT PLACEMENT Left 11/19/2016   Procedure: CYSTOSCOPY WITH URETEROSCOPY AND STENT PLACEMENT;  Surgeon: Hollice Espy, MD;  Location: [facility] ORS;  Service: Urology;  Laterality: Left;  . FRACTURE SURGERY    . JOINT REPLACEMENT  2005   Left knee  . JOINT REPLACEMENT  12/13   Right knee  . PROSTATE SURGERY  1996   cancer  . sigmoid resection    . TIBIA FRACTURE SURGERY  1999  . URETERAL BIOPSY Left 11/19/2016   Procedure: URETERAL BIOPSY;  Surgeon: Hollice Espy, MD;  Location: [facility] ORS;  Service: Urology;  Laterality: Left;  Marland Kitchen VOLVULUS REDUCTION  12/10    Social History Social History   Tobacco Use  . Smoking status: Never Smoker  . Smokeless tobacco: Never Used  Substance Use Topics  . Alcohol use: No  Alcohol/week: 0.0 - 0.6 oz    Frequency: Never    Comment: in past  . Drug use: No    Family History Family History  Problem Relation Age of Onset  . Leukemia Mother   . Diabetes Son   . Prostate cancer Neg Hx   . Bladder Cancer Neg Hx   . Kidney cancer Neg Hx   No family history of bleeding/clotting disorders, porphyria or autoimmune disease   Allergies  Allergen Reactions  . Other     RAW ONIONS- SINUS REACTION     REVIEW OF SYSTEMS (Negative unless checked)  Constitutional: [] Weight loss  [] Fever  [] Chills Cardiac: [] Chest pain   [] Chest pressure   [] Palpitations   [] Shortness of breath when laying flat   [] Shortness of breath with exertion. Vascular:  [] Pain in legs with walking   [x] Pain in legs at rest  [] History of DVT   [] Phlebitis   [x] Swelling in legs   [x] Varicose veins   [] Non-healing ulcers Pulmonary:   [] Uses home oxygen   [] Productive cough   [] Hemoptysis   [] Wheeze  [] COPD   [] Asthma Neurologic:  [] Dizziness   [] Seizures   [] History of stroke   [] History of TIA  [] Aphasia   [] Vissual changes   [] Weakness or numbness in arm   [] Weakness or numbness in  leg Musculoskeletal:   [] Joint swelling   [x] Joint pain   [x] Low back pain Hematologic:  [] Easy bruising  [] Easy bleeding   [] Hypercoagulable state   [] Anemic Gastrointestinal:  [] Diarrhea   [] Vomiting  [] Gastroesophageal reflux/heartburn   [] Difficulty swallowing. Genitourinary:  [] Chronic kidney disease   [] Difficult urination  [] Frequent urination   [] Blood in urine Skin:  [x] Rashes   [x] Ulcers  Psychological:  [] History of anxiety   []  History of major depression.  Physical Examination  Vitals:   06/15/17 1610  BP: 117/63  Pulse: 60  Resp: 16  Weight: 180 lb (81.6 kg)  Height: 6\' 2"  (1.88 m)   Body mass index is 23.11 kg/m. Gen: WD/WN, NAD Head: [place]/AT, No temporalis wasting.  Ear/Nose/Throat: Hearing grossly intact, nares w/o erythema or drainage, poor dentition Eyes: PER, EOMI, sclera nonicteric.  Neck: Supple, no masses.  No bruit or JVD.  Pulmonary:  Good air movement, clear to auscultation bilaterally, no use of accessory muscles.  Cardiac: RRR, normal S1, S2, no Murmurs. Vascular: scattered varicosities present bilaterally.  Mild to moderate venous stasis changes to the legs bilaterally left greater than right.  2+ soft pitting edema left greater than right Vessel Right Left  Radial Palpable Palpable  PT Palpable Palpable  DP Not Palpable Not Palpable  Gastrointestinal: soft, non-distended. No guarding/no peritoneal signs.  Musculoskeletal: M/S 5/5 throughout.  No deformity or atrophy.  Neurologic: CN 2-12 intact. Pain and light touch intact in extremities.  Symmetrical.  Speech is fluent. Motor exam as listed above. Psychiatric: Judgment intact, Mood & affect appropriate for pt's clinical situation. Dermatologic: Venous rashes no ulcers noted.  No changes consistent with cellulitis. Lymph : No Cervical lymphadenopathy, no lichenification or skin changes of chronic lymphedema.  CBC Lab Results  Component Value Date   WBC 6.2 05/13/2017   HGB 12.0 (L) 05/13/2017    HCT 35.6 (L) 05/13/2017   MCV 92.4 05/13/2017   PLT 180 05/13/2017    BMET    Component Value Date/Time   NA 135 05/13/2017 1042   NA 140 02/11/2012 0415   K 4.5 05/13/2017 1042   K 3.9 02/11/2012 0415   CL 106 05/13/2017 1042  CL 108 (H) 02/11/2012 0415   CO2 21 (L) 05/13/2017 1042   CO2 25 02/11/2012 0415   GLUCOSE 149 (H) 05/13/2017 1042   GLUCOSE 125 (H) 02/11/2012 0415   BUN 24 (H) 05/13/2017 1042   BUN 12 02/11/2012 0415   CREATININE 0.92 05/13/2017 1042   CREATININE 1.00 03/23/2014 1400   CALCIUM 9.2 05/13/2017 1042   CALCIUM 8.3 (L) 03/23/2014 1400   GFRNONAA >60 05/13/2017 1042   GFRNONAA >60 03/23/2014 1400   GFRNONAA 49 (L) 09/02/2013 1428   GFRAA >60 05/13/2017 1042   GFRAA >60 03/23/2014 1400   GFRAA 57 (L) 09/02/2013 1428   CrCl cannot be calculated (Patient's most recent lab result is older than the maximum 21 days allowed.).  COAG Lab Results  Component Value Date   INR 0.9 02/02/2012    Radiology No results found.   Assessment/Plan 1. Chronic venous insufficiency Recommend:  No surgery or intervention at this point in time.    I have reviewed my previous discussion with the patient regarding swelling and why it causes symptoms.  Patient will continue wearing graduated compression stockings ( 15-20 mmHg) on a daily basis.  [employer] is a brand were specifically discussed given the Mirena wool.  The patient will  beginning wearing the stockings first thing in the morning and removing them in the evening. The patient is instructed specifically not to sleep in the stockings.    In addition, behavioral modification including several periods of elevation of the lower extremities during the day will be continued.  This was reviewed with the patient during the initial visit.  The patient will also continue routine exercise, especially walking on a daily basis as was discussed during the initial visit.      I believe that the patient will require a  lymph pump.  I will see him back in 1 month to reassess his control of his lymphedema and venous insufficiency with compression therapy.  If the patient still has stage 3 lymphedema a lymph pump should be added to improve the control of the patient's lymphedema.  Additionally, a lymph pump is warranted because it will reduce the risk of cellulitis and ulceration in the future.  Patient should follow-up in six months    2. Lymphedema Recommend:  No surgery or intervention at this point in time.    I have reviewed my previous discussion with the patient regarding swelling and why it causes symptoms.  Patient will continue wearing graduated compression stockings class 1 (20-30 mmHg) on a daily basis. The patient will  beginning wearing the stockings first thing in the morning and removing them in the evening. The patient is instructed specifically not to sleep in the stockings.    In addition, behavioral modification including several periods of elevation of the lower extremities during the day will be continued.  This was reviewed with the patient during the initial visit.  The patient will also continue routine exercise, especially walking on a daily basis as was discussed during the initial visit.    See above  Patient should follow-up in 1 month   3. Primary osteoarthritis involving multiple joints Continue NSAID medications as already ordered, these medications have been reviewed and there are no changes at this time.  Continued activity and therapy was stressed.   4. Aortic atherosclerosis (HCC)  Recommend:  The patient has evidence of atherosclerosis of the lower extremities with claudication.  The patient does not voice lifestyle limiting changes at this point in  time.  Noninvasive studies do not suggest clinically significant change.  No invasive studies, angiography or surgery at this time The patient should continue walking and begin a more formal exercise program.  The  patient should continue antiplatelet therapy and aggressive treatment of the lipid abnormalities  No changes in the patient's medications at this time  The patient should continue wearing graduated compression socks 15-20 mmHg strength to control the edema and prevent further episodes of cellulitis as directed above.      Hortencia Pilar, MD  06/16/2017 12:14 PM

## 2017-06-22 DIAGNOSIS — I872 Venous insufficiency (chronic) (peripheral): Secondary | ICD-10-CM | POA: Diagnosis not present

## 2017-06-22 DIAGNOSIS — L03119 Cellulitis of unspecified part of limb: Secondary | ICD-10-CM | POA: Diagnosis not present

## 2017-06-23 DIAGNOSIS — I700 Atherosclerosis of aorta: Secondary | ICD-10-CM | POA: Diagnosis not present

## 2017-06-23 DIAGNOSIS — L03116 Cellulitis of left lower limb: Secondary | ICD-10-CM | POA: Diagnosis not present

## 2017-06-23 DIAGNOSIS — I872 Venous insufficiency (chronic) (peripheral): Secondary | ICD-10-CM | POA: Diagnosis not present

## 2017-06-23 DIAGNOSIS — K5909 Other constipation: Secondary | ICD-10-CM | POA: Diagnosis not present

## 2017-06-23 DIAGNOSIS — M40203 Unspecified kyphosis, cervicothoracic region: Secondary | ICD-10-CM | POA: Diagnosis not present

## 2017-06-23 DIAGNOSIS — C61 Malignant neoplasm of prostate: Secondary | ICD-10-CM | POA: Diagnosis not present

## 2017-06-23 MED FILL — XTANDI 40 MG CAPSULE: 40 | 30 days supply | Qty: 90 | Fill #2

## 2017-06-24 ENCOUNTER — Inpatient Hospital Stay: Payer: Medicare Other | Attending: Internal Medicine

## 2017-06-24 ENCOUNTER — Inpatient Hospital Stay: Payer: Medicare Other

## 2017-06-24 ENCOUNTER — Inpatient Hospital Stay (HOSPITAL_BASED_OUTPATIENT_CLINIC_OR_DEPARTMENT_OTHER): Payer: Medicare Other | Admitting: Internal Medicine

## 2017-06-24 VITALS — BP 124/60 | Temp 97.2°F | Resp 16 | Wt 180.5 lb

## 2017-06-24 DIAGNOSIS — D509 Iron deficiency anemia, unspecified: Secondary | ICD-10-CM | POA: Insufficient documentation

## 2017-06-24 DIAGNOSIS — Z7982 Long term (current) use of aspirin: Secondary | ICD-10-CM | POA: Insufficient documentation

## 2017-06-24 DIAGNOSIS — I872 Venous insufficiency (chronic) (peripheral): Secondary | ICD-10-CM | POA: Diagnosis not present

## 2017-06-24 DIAGNOSIS — N1330 Unspecified hydronephrosis: Secondary | ICD-10-CM

## 2017-06-24 DIAGNOSIS — Z806 Family history of leukemia: Secondary | ICD-10-CM | POA: Insufficient documentation

## 2017-06-24 DIAGNOSIS — Z79899 Other long term (current) drug therapy: Secondary | ICD-10-CM | POA: Insufficient documentation

## 2017-06-24 DIAGNOSIS — Z85828 Personal history of other malignant neoplasm of skin: Secondary | ICD-10-CM

## 2017-06-24 DIAGNOSIS — Z86010 Personal history of colonic polyps: Secondary | ICD-10-CM

## 2017-06-24 DIAGNOSIS — Z192 Hormone resistant malignancy status: Secondary | ICD-10-CM | POA: Diagnosis not present

## 2017-06-24 DIAGNOSIS — K5900 Constipation, unspecified: Secondary | ICD-10-CM | POA: Insufficient documentation

## 2017-06-24 DIAGNOSIS — I700 Atherosclerosis of aorta: Secondary | ICD-10-CM | POA: Diagnosis not present

## 2017-06-24 DIAGNOSIS — Z79818 Long term (current) use of other agents affecting estrogen receptors and estrogen levels: Secondary | ICD-10-CM | POA: Insufficient documentation

## 2017-06-24 DIAGNOSIS — E538 Deficiency of other specified B group vitamins: Secondary | ICD-10-CM | POA: Insufficient documentation

## 2017-06-24 DIAGNOSIS — M129 Arthropathy, unspecified: Secondary | ICD-10-CM

## 2017-06-24 DIAGNOSIS — M810 Age-related osteoporosis without current pathological fracture: Secondary | ICD-10-CM | POA: Diagnosis not present

## 2017-06-24 DIAGNOSIS — R000 Tachycardia, unspecified: Secondary | ICD-10-CM

## 2017-06-24 DIAGNOSIS — C61 Malignant neoplasm of prostate: Secondary | ICD-10-CM | POA: Insufficient documentation

## 2017-06-24 LAB — COMPREHENSIVE METABOLIC PANEL
ALBUMIN: 3.7 g/dL (ref 3.5–5.0)
ALK PHOS: 50 U/L (ref 38–126)
ALT: 10 U/L — ABNORMAL LOW (ref 17–63)
ANION GAP: 8 (ref 5–15)
AST: 24 U/L (ref 15–41)
BILIRUBIN TOTAL: 0.7 mg/dL (ref 0.3–1.2)
BUN: 18 mg/dL (ref 6–20)
CALCIUM: 9 mg/dL (ref 8.9–10.3)
CO2: 20 mmol/L — ABNORMAL LOW (ref 22–32)
Chloride: 106 mmol/L (ref 101–111)
Creatinine, Ser: 1.01 mg/dL (ref 0.61–1.24)
GFR calc Af Amer: >60 mL/min (ref >60)
GFR calc non Af Amer: >60 mL/min (ref >60)
GLUCOSE: 169 mg/dL — AB (ref 65–99)
POTASSIUM: 4.3 mmol/L (ref 3.5–5.1)
Sodium: 134 mmol/L — ABNORMAL LOW (ref 135–145)
TOTAL PROTEIN: 6.9 g/dL (ref 6.5–8.1)

## 2017-06-24 LAB — CBC WITH DIFFERENTIAL/PLATELET
BASOS ABS: 0 10*3/uL (ref 0–0.1)
BASOS PCT: 0 %
Eosinophils Absolute: 0.2 10*3/uL (ref 0–0.7)
Eosinophils Relative: 4 %
HEMATOCRIT: 34.9 % — AB (ref 40.0–52.0)
HEMOGLOBIN: 12 g/dL — AB (ref 13.0–18.0)
LYMPHS PCT: 29 %
Lymphs Abs: 1.8 10*3/uL (ref 1.0–3.6)
MCH: 31.2 pg (ref 26.0–34.0)
MCHC: 34.3 g/dL (ref 32.0–36.0)
MCV: 90.8 fL (ref 80.0–100.0)
Monocytes Absolute: 0.6 10*3/uL (ref 0.2–1.0)
Monocytes Relative: 10 %
NEUTROS ABS: 3.6 10*3/uL (ref 1.4–6.5)
NEUTROS PCT: 57 %
Platelets: 143 10*3/uL — ABNORMAL LOW (ref 150–440)
RBC: 3.84 MIL/uL — ABNORMAL LOW (ref 4.40–5.90)
RDW: 13.7 % (ref 11.5–14.5)
WBC: 6.2 10*3/uL (ref 3.8–10.6)

## 2017-06-24 LAB — PSA: Prostatic Specific Antigen: 2.56 ng/mL (ref 0.00–4.00)

## 2017-06-24 MED ORDER — LEUPROLIDE ACETATE (3 MONTH) 22.5 MG IM KIT
22.5000 mg | PACK | Freq: Once | INTRAMUSCULAR | Status: AC
  Administered 2017-06-24: 22.5 mg via INTRAMUSCULAR
  Filled 2017-06-24: qty 22.5

## 2017-06-24 NOTE — Assessment & Plan Note (Addendum)
# #  CASTRATE RESISTANT PROSTATE CANCER- Metastatic disease with left bladder invasion/obstruction. OCT 30th PET scan-  Pelvic/RP LN; left later bladder wall- possibly leading to left ureter obstruction/hydronephrosis [C discussion below]  #Continue Xtandi 3 pills a day; PSA improving; continue Lupron every 3 months.  #Left ureteral stenting/hydronephrosis-currently stable.  # Severe osteoporosis currently on Prolia [last February 2019]  # Iron deficiency anemia- question related to urinary blood loss. By mouth iron tolerating well. Hemoglobin 11.8.   # follow up in 6 weeks/labs- cbc/cmp/psa.

## 2017-06-24 NOTE — Progress Notes (Signed)
OFFICE PROGRESS NOTE  Patient Care Team: Venia Carbon, MD as PCP - General   SUMMARY OF ONCOLOGIC HISTORY: Oncology History    # 1996 PROSTATE CANCER [Gleason score 2+3] s/p Radical Prostatectomy [[facility]]; ?Biochem RECURRENT PROSTATE CANCER-Hormone sensitive;  Intermittent Lupron; on Lupron q 80M [Jan 2016 PSA- 7.9].  HOLD LUPRON in MAY 2017; AUg 2017- Re-start  # OCT 2018- Xtandi 3 pills a day; OCT 30th PET- Retroperitoneal/pelvic adenopathy/recurrence lateral bladder wall; NO Bone lesions.   # Osteoporosis [BMD- 0973]- on Reclast q 59M; FEB 2017- Start Prolia q 6 M     Prostate cancer [facility])    INTERVAL HISTORY:  84 year old male patient with a history of castrate resistant recurrent prostate cancer currently on Lupron/Xtandi since October 2018 here for follow-up.  Patient is currently accompanied by his daughter.  Patient denies any unusual fatigue.  Denies any unusual seizure-like activity.  Denies any of abdominal pain nausea vomiting.  Denies any chest pain or shortness of the cough.   REVIEW OF SYSTEMS:  A complete 10 point review of system is done which is negative except mentioned above/history of present illness.   PAST MEDICAL HISTORY :  Past Medical History:  Diagnosis Date  . Aortic atherosclerosis ([place])    Noted on CT  . Arthritis   . Colon polyps    adenomatous  . History of fracture of tibia   . History of tachycardia    patient unaware of this  . Kyphosis of cervicothoracic region    SEVERE  . Osteoporosis 01/2014   T -3.8 (01/2014)  . Other constipation    chronic  . Prostate cancer ([place])    surgery then lupron  . Unspecified venous (peripheral) insufficiency   . Vitamin B12 deficiency     PAST SURGICAL HISTORY :   Past Surgical History:  Procedure Laterality Date  . BASAL CELL CARCINOMA EXCISION  2004   Left side of face  . CATARACT EXTRACTION Bilateral 2000, 2003   both eyes  . COLONOSCOPY     h/o adenomatous polyps  . CYSTOSCOPY W/  RETROGRADES Bilateral 11/19/2016   Procedure: CYSTOSCOPY WITH RETROGRADE PYELOGRAM;  Surgeon: Hollice Espy, MD;  Location: [facility] ORS;  Service: Urology;  Laterality: Bilateral;  General vs. Spinal  . CYSTOSCOPY W/ URETERAL STENT PLACEMENT Left 02/25/2017   Procedure: CYSTOSCOPY WITH STENT REPLACEMENT;  Surgeon: Hollice Espy, MD;  Location: [facility];  Service: Urology;  Laterality: Left;  . CYSTOSCOPY WITH URETEROSCOPY AND STENT PLACEMENT Left 11/19/2016   Procedure: CYSTOSCOPY WITH URETEROSCOPY AND STENT PLACEMENT;  Surgeon: Hollice Espy, MD;  Location: [facility] OR[facility];  Service: Urology;  Laterality: Left;  . FRACTURE SURGERY    . JOINT REPLACEMENT  2005   Left knee  . JOINT REPLACEMENT  12/13   Right knee  . PROSTATE SURGERY  1996   cancer  . sigmoid resection    . TIBIA FRACTURE SURGERY  1999  . URETERAL BIOPSY Left 11/19/2016   Procedure: URETERAL BIOPSY;  Surgeon: Hollice Espy, MD;  Location: [facility] OR[facility];  Service: Urology;  Laterality: Left;  Marland Kitchen VOLVULUS REDUCTION  12/10    FAMILY HISTORY :   Family History  Problem Relation Age of Onset  . Leukemia Mother   . Diabetes Son   . Prostate cancer Neg Hx   . Bladder Cancer Neg Hx   . Kidney cancer Neg Hx     SOCIAL HISTORY:   Social History   Tobacco Use  . Smoking status: Never Smoker  .  Smokeless tobacco: Never Used  Substance Use Topics  . Alcohol use: No    Alcohol/week: 0.0 - 0.6 oz    Frequency: Never    Comment: in past  . Drug use: No    ALLERGIES:  is allergic to other.  MEDICATIONS:  Current Outpatient Medications  Medication Sig Dispense Refill  . acetaminophen (TYLENOL) 325 MG tablet Take 325-650 mg by mouth every 4 (four) hours as needed for fever (general discomfort).     Marland Kitchen alum & mag hydroxide-simeth (MAALOX/MYLANTA) 200-200-20 MG/5ML suspension Take 30 mLs by mouth every 4 (four) hours as needed for indigestion or heartburn.    Marland Kitchen aspirin EC 81 MG tablet Take 81 mg by mouth every other day.    . Biotin  2.5 MG CAPS Take 2.5 mg by mouth daily.     Marland Kitchen bismuth subsalicylate (KAOPECTATE) 262 MG/15ML suspension Take 10 mLs by mouth 3 (three) times daily as needed for diarrhea or loose stools.     . Calcium Carbonate-Vitamin D (CALCIUM-D) 600-400 MG-UNIT TABS Take 1 tablet by mouth daily.     . carbamide peroxide (DEBROX) 6.5 % OTIC solution 5 drops daily as needed.    . cholecalciferol (VITAMIN D) 1000 UNITS tablet Take 1,000 Units by mouth 2 (two) times daily.     . cyclobenzaprine (FLEXERIL) 5 MG tablet Take 5 mg by mouth at bedtime as needed for muscle spasms.    Marland Kitchen dextromethorphan-guaiFENesin (ROBITUSSIN-DM) 10-100 MG/5ML liquid Take 10 mLs by mouth every 4 (four) hours as needed for cough.    . diphenhydrAMINE (BENADRYL) 25 mg capsule Take 25 mg by mouth every 4 (four) hours as needed for itching or allergies.    Marland Kitchen doxycycline (VIBRA-TABS) 100 MG tablet     . fluPHENAZine decanoate (PROLIXIN) 25 MG/ML injection Inject into the muscle.    . fluticasone (FLONASE) 50 MCG/ACT nasal spray Place 1 spray into both nostrils daily.    Marland Kitchen HYDROcodone-acetaminophen (NORCO/VICODIN) 5-325 MG tablet Take 1 tablet by mouth every 6 (six) hours as needed for moderate pain. 30 tablet 0  . ibuprofen (ADVIL,MOTRIN) 200 MG tablet Take 400 mg by mouth 2 (two) times daily as needed for headache or mild pain.     Marland Kitchen latanoprost (XALATAN) 0.005 % ophthalmic solution Place 1 drop into both eyes at bedtime.    Marland Kitchen loratadine (CLARITIN) 10 MG tablet Take 20 mg by mouth at bedtime.    . magnesium hydroxide (MILK OF MAGNESIA) 400 MG/5ML suspension Take 30 mLs by mouth daily as needed for mild constipation.    . mupirocin ointment (BACTROBAN) 2 % Place 1 application into the nose 2 (two) times daily. 22 g 0  . nystatin (NYSTATIN) powder Apply 1 g topically 2 (two) times daily as needed (rash). Use 1 application twice daily as needed for rash    . polyethylene glycol powder (GLYCOLAX/MIRALAX) powder FILL TO LINE (17 GRAMS ), MIX AND  DRINK TWICE A DAY AND 8.5 GRAMS AT NOON 510 g 3  . sennosides-docusate sodium (SENOKOT-S) 8.6-50 MG tablet Take 4 tablets by mouth every evening.     . Skin Protectants, Misc. (EUCERIN) cream Apply 1 application topically daily.    . sodium chloride (OCEAN) 0.65 % SOLN nasal spray Place 1-2 sprays into both nostrils 2 (two) times daily as needed for congestion.    . traMADol (ULTRAM) 50 MG tablet Take 1 tablet (50 mg total) by mouth every 6 (six) hours as needed for moderate pain. 30 tablet 0  .  triamcinolone cream (KENALOG) 0.1 % Apply 1 application topically daily as needed.     . triamcinolone cream (KENALOG) 0.1 % Apply 1 application topically daily.    . vitamin B-12 (CYANOCOBALAMIN) 1000 MCG tablet Take 1,000 mcg by mouth daily.    . vitamin C (ASCORBIC ACID) 500 MG tablet Take 500 mg by mouth daily.    [name] 40 MG capsule TAKE 3 CAPSULES (120 MG TOTAL) BY MOUTH DAILY. 90 capsule 4   No current facility-administered medications for this visit.    Facility-Administered Medications Ordered in Other Visits  Medication Dose Route Frequency Provider Last Rate Last Dose  . leuprolide (LUPRON) injection 30 mg  30 mg Intramuscular Once Leia Alf, MD        PHYSICAL EXAMINATION: ECOG PERFORMANCE STATUS: 1 - Symptomatic but completely ambulatory  BP 124/60 (BP Location: Left Arm, Patient Position: Sitting)   Temp (!) 97.2 F (36.2 C) (Tympanic)   Resp 16   Wt 180 lb 8 oz (81.9 kg)   BMI 23.17 kg/m   Filed Weights   06/24/17 1051  Weight: 180 lb 8 oz (81.9 kg)    GENERAL: Well-nourished well-developed; Alert, no distress and comfortable.  With his daughter.  Walks with a rolling walker EYES: no pallor or icterus OROPHARYNX: no thrush or ulceration; good dentition  NECK: supple, no masses felt LYMPH:  no palpable lymphadenopathy in the cervical, axillary or inguinal regions LUNGS: clear to auscultation and  No wheeze or crackles HEART/CVS: regular rate & rhythm and no  murmurs; No lower extremity edema ABDOMEN:abdomen soft, non-tender and normal bowel sounds Musculoskeletal:no cyanosis of digits and no clubbing  PSYCH: alert & oriented x 3 with fluent speech NEURO: no focal motor/sensory deficits SKIN:  no rashes or significant lesions  LABORATORY DATA:  I have reviewed the data as listed    Component Value Date/Time   NA 134 (L) 06/24/2017 1031   NA 140 02/11/2012 0415   K 4.3 06/24/2017 1031   K 3.9 02/11/2012 0415   CL 106 06/24/2017 1031   CL 108 (H) 02/11/2012 0415   CO2 20 (L) 06/24/2017 1031   CO2 25 02/11/2012 0415   GLUCOSE 169 (H) 06/24/2017 1031   GLUCOSE 125 (H) 02/11/2012 0415   BUN 18 06/24/2017 1031   BUN 12 02/11/2012 0415   CREATININE 1.01 06/24/2017 1031   CREATININE 1.00 03/23/2014 1400   CALCIUM 9.0 06/24/2017 1031   CALCIUM 8.3 (L) 03/23/2014 1400   PROT 6.9 06/24/2017 1031   PROT 7.0 03/07/2014 1414   ALBUMIN 3.7 06/24/2017 1031   ALBUMIN 3.7 03/07/2014 1414   AST 24 06/24/2017 1031   AST 16 03/07/2014 1414   ALT 10 (L) 06/24/2017 1031   ALT 19 03/07/2014 1414   ALKPHOS 50 06/24/2017 1031   ALKPHOS 69 03/07/2014 1414   BILITOT 0.7 06/24/2017 1031   BILITOT 0.3 03/07/2014 1414   GFRNONAA >60 06/24/2017 1031   GFRNONAA >60 03/23/2014 1400   GFRNONAA 49 (L) 09/02/2013 1428   GFRAA >60 06/24/2017 1031   GFRAA >60 03/23/2014 1400   GFRAA 57 (L) 09/02/2013 1428    No results found for: SPEP, UPEP  Lab Results  Component Value Date   WBC 6.2 06/24/2017   NEUTROABS 3.6 06/24/2017   HGB 12.0 (L) 06/24/2017   HCT 34.9 (L) 06/24/2017   MCV 90.8 06/24/2017   PLT 143 (L) 06/24/2017      Chemistry      Component Value Date/Time   NA  134 (L) 06/24/2017 1031   NA 140 02/11/2012 0415   K 4.3 06/24/2017 1031   K 3.9 02/11/2012 0415   CL 106 06/24/2017 1031   CL 108 (H) 02/11/2012 0415   CO2 20 (L) 06/24/2017 1031   CO2 25 02/11/2012 0415   BUN 18 06/24/2017 1031   BUN 12 02/11/2012 0415   CREATININE 1.01  06/24/2017 1031   CREATININE 1.00 03/23/2014 1400   GLU 114 06/30/2016      Component Value Date/Time   CALCIUM 9.0 06/24/2017 1031   CALCIUM 8.3 (L) 03/23/2014 1400   ALKPHOS 50 06/24/2017 1031   ALKPHOS 69 03/07/2014 1414   AST 24 06/24/2017 1031   AST 16 03/07/2014 1414   ALT 10 (L) 06/24/2017 1031   ALT 19 03/07/2014 1414   BILITOT 0.7 06/24/2017 1031   BILITOT 0.3 03/07/2014 1414     Results for JAMASON, PECKHAM (MRN [id number]) as of 06/24/2017 11:08  Ref. Range 07/02/2016 13:44 10/03/2016 13:41 11/03/2016 09:49 12/01/2016 14:12 12/29/2016 09:38 02/09/2017 13:55 04/01/2017 11:01 05/13/2017 10:42  PSA Latest Ref Range: 0.00 - 4.00 ng/mL 3.98         Prostatic Specific Antigen Latest Ref Range: 0.00 - 4.00 ng/mL  5.37 (H) 8.02 (H) 8.99 (H) 2.53 3.68 3.72 2.84      IMPRESSION: 1. Radiotracer accumulation with in LEFT periaortic lymph nodes and LEFT common iliac lymph nodes consistent with prostate cancer metastasis. 2. Focal activity along the posterior LEFT aspect of the bladder is concerning for local prostate cancer invasion. 3. No evidence skeletal metastasis.   Electronically Signed   By: Suzy Bouchard M.D.   On: 12/23/2016 15:20    ASSESSMENT & PLAN:   Prostate cancer ([place]) # #CASTRATE RESISTANT PROSTATE CANCER- Metastatic disease with left bladder invasion/obstruction. OCT 30th PET scan-  Pelvic/RP LN; left later bladder wall- possibly leading to left ureter obstruction/hydronephrosis [C discussion below]  #Continue Xtandi 3 pills a day; PSA improving; continue Lupron every 3 months.  #Left ureteral stenting/hydronephrosis-currently stable.  # Severe osteoporosis currently on Prolia [last February 2019]  # Iron deficiency anemia- question related to urinary blood loss. By mouth iron tolerating well. Hemoglobin 11.8.   # follow up in 6 weeks/labs- cbc/cmp/psa.      Cammie Sickle, MD 07/07/2017 3:43 PM

## 2017-06-29 ENCOUNTER — Telehealth: Payer: Self-pay | Admitting: Internal Medicine

## 2017-06-29 DIAGNOSIS — I700 Atherosclerosis of aorta: Secondary | ICD-10-CM | POA: Diagnosis not present

## 2017-06-29 DIAGNOSIS — I872 Venous insufficiency (chronic) (peripheral): Secondary | ICD-10-CM | POA: Diagnosis not present

## 2017-06-29 DIAGNOSIS — L03116 Cellulitis of left lower limb: Secondary | ICD-10-CM | POA: Diagnosis not present

## 2017-06-29 DIAGNOSIS — K5909 Other constipation: Secondary | ICD-10-CM | POA: Diagnosis not present

## 2017-06-29 DIAGNOSIS — M40203 Unspecified kyphosis, cervicothoracic region: Secondary | ICD-10-CM | POA: Diagnosis not present

## 2017-06-29 DIAGNOSIS — C61 Malignant neoplasm of prostate: Secondary | ICD-10-CM | POA: Diagnosis not present

## 2017-06-29 NOTE — Telephone Encounter (Signed)
Copied from [place] [phone]. Topic: Quick Communication - See Telephone Encounter >> Jun 29, 2017  4:33 PM Arletha Grippe wrote: CRM for notification. See Telephone encounter for: 06/29/17. [facility] - [facility] and [facility] daughter decided to discharge [facility].   Cb is [phone]

## 2017-06-30 NOTE — Telephone Encounter (Signed)
Okay, noted. He is in AL so he has help

## 2017-07-07 ENCOUNTER — Ambulatory Visit (INDEPENDENT_AMBULATORY_CARE_PROVIDER_SITE_OTHER): Payer: Medicare Other | Admitting: Podiatry

## 2017-07-07 ENCOUNTER — Encounter: Payer: Self-pay | Admitting: Podiatry

## 2017-07-07 DIAGNOSIS — L84 Corns and callosities: Secondary | ICD-10-CM | POA: Diagnosis not present

## 2017-07-07 DIAGNOSIS — L03116 Cellulitis of left lower limb: Secondary | ICD-10-CM | POA: Diagnosis not present

## 2017-07-07 DIAGNOSIS — I890 Lymphedema, not elsewhere classified: Secondary | ICD-10-CM

## 2017-07-13 DIAGNOSIS — I10 Essential (primary) hypertension: Secondary | ICD-10-CM | POA: Diagnosis not present

## 2017-07-13 LAB — CBC AND DIFFERENTIAL
HCT: 36 — AB (ref 41–53)
HEMOGLOBIN: 12.7 — AB (ref 13.5–17.5)

## 2017-07-15 ENCOUNTER — Encounter: Payer: Self-pay | Admitting: Internal Medicine

## 2017-07-16 ENCOUNTER — Ambulatory Visit (INDEPENDENT_AMBULATORY_CARE_PROVIDER_SITE_OTHER): Payer: Medicare Other | Admitting: Vascular Surgery

## 2017-07-16 ENCOUNTER — Encounter (INDEPENDENT_AMBULATORY_CARE_PROVIDER_SITE_OTHER): Payer: Self-pay | Admitting: Vascular Surgery

## 2017-07-16 ENCOUNTER — Ambulatory Visit: Payer: Medicare Other | Admitting: Internal Medicine

## 2017-07-16 VITALS — BP 131/68 | HR 70 | Resp 15 | Ht 72.0 in | Wt 181.0 lb

## 2017-07-16 DIAGNOSIS — M150 Primary generalized (osteo)arthritis: Secondary | ICD-10-CM

## 2017-07-16 DIAGNOSIS — I700 Atherosclerosis of aorta: Secondary | ICD-10-CM | POA: Diagnosis not present

## 2017-07-16 DIAGNOSIS — I890 Lymphedema, not elsewhere classified: Secondary | ICD-10-CM

## 2017-07-16 DIAGNOSIS — M159 Polyosteoarthritis, unspecified: Secondary | ICD-10-CM

## 2017-07-16 DIAGNOSIS — I872 Venous insufficiency (chronic) (peripheral): Secondary | ICD-10-CM

## 2017-07-16 DIAGNOSIS — G463 Brain stem stroke syndrome: Secondary | ICD-10-CM

## 2017-07-16 MED FILL — XTANDI 40 MG CAPSULE: 40 | 30 days supply | Qty: 90 | Fill #3

## 2017-07-16 NOTE — Progress Notes (Signed)
MRN : [id number]  Benjamin Villarreal is a 84 y.o. (Jul 06, 1920) male who presents with chief complaint of  Chief Complaint  Patient presents with  . Follow-up    1 month no studies  .  History of Present Illness:The patient returns to the office for followup evaluation regarding leg swelling.  The swelling has improved quite a bit and the pain associated with swelling has decreased substantially. There have not been any interval development of a ulcerations or wounds.  Since the previous visit the patient has been wearing graduated compression stockings and has noted little significant improvement in the lymphedema. The patient has been using compression routinely morning until night.  The patient also states elevation during the day and exercise is being done too.    Current Meds  Medication Sig  . acetaminophen (TYLENOL) 325 MG tablet Take 325-650 mg by mouth every 4 (four) hours as needed for fever (general discomfort).   Marland Kitchen alum & mag hydroxide-simeth (MAALOX/MYLANTA) 200-200-20 MG/5ML suspension Take 30 mLs by mouth every 4 (four) hours as needed for indigestion or heartburn.  Marland Kitchen aspirin EC 81 MG tablet Take 81 mg by mouth every other day.  . Biotin 2.5 MG CAPS Take 2.5 mg by mouth daily.   Marland Kitchen bismuth subsalicylate (KAOPECTATE) 262 MG/15ML suspension Take 10 mLs by mouth 3 (three) times daily as needed for diarrhea or loose stools.   . Calcium Carbonate-Vitamin D (CALCIUM-D) 600-400 MG-UNIT TABS Take 1 tablet by mouth daily.   . carbamide peroxide (DEBROX) 6.5 % OTIC solution 5 drops daily as needed.  . cholecalciferol (VITAMIN D) 1000 UNITS tablet Take 1,000 Units by mouth 2 (two) times daily.   . cyclobenzaprine (FLEXERIL) 5 MG tablet Take 5 mg by mouth at bedtime as needed for muscle spasms.  Marland Kitchen dextromethorphan-guaiFENesin (ROBITUSSIN-DM) 10-100 MG/5ML liquid Take 10 mLs by mouth every 4 (four) hours as needed for cough.  . diphenhydrAMINE (BENADRYL) 25 mg capsule Take 25 mg by mouth  every 4 (four) hours as needed for itching or allergies.  Marland Kitchen doxycycline (VIBRA-TABS) 100 MG tablet   . fluPHENAZine decanoate (PROLIXIN) 25 MG/ML injection Inject into the muscle.  . fluticasone (FLONASE) 50 MCG/ACT nasal spray Place 1 spray into both nostrils daily.  Marland Kitchen HYDROcodone-acetaminophen (NORCO/VICODIN) 5-325 MG tablet Take 1 tablet by mouth every 6 (six) hours as needed for moderate pain.  Marland Kitchen ibuprofen (ADVIL,MOTRIN) 200 MG tablet Take 400 mg by mouth 2 (two) times daily as needed for headache or mild pain.   Marland Kitchen latanoprost (XALATAN) 0.005 % ophthalmic solution Place 1 drop into both eyes at bedtime.  Marland Kitchen loratadine (CLARITIN) 10 MG tablet Take 20 mg by mouth at bedtime.  . magnesium hydroxide (MILK OF MAGNESIA) 400 MG/5ML suspension Take 30 mLs by mouth daily as needed for mild constipation.  . mupirocin ointment (BACTROBAN) 2 % Place 1 application into the nose 2 (two) times daily.  Marland Kitchen nystatin (NYSTATIN) powder Apply 1 g topically 2 (two) times daily as needed (rash). Use 1 application twice daily as needed for rash  . oseltamivir (TAMIFLU) 75 MG capsule   . polyethylene glycol powder (GLYCOLAX/MIRALAX) powder FILL TO LINE (17 GRAMS ), MIX AND DRINK TWICE A DAY AND 8.5 GRAMS AT NOON  . sennosides-docusate sodium (SENOKOT-S) 8.6-50 MG tablet Take 4 tablets by mouth every evening.   . Skin Protectants, Misc. (EUCERIN) cream Apply 1 application topically daily.  . sodium chloride (OCEAN) 0.65 % SOLN nasal spray Place 1-2 sprays into both nostrils 2 (  two) times daily as needed for congestion.  . traMADol (ULTRAM) 50 MG tablet Take 1 tablet (50 mg total) by mouth every 6 (six) hours as needed for moderate pain.  Marland Kitchen triamcinolone cream (KENALOG) 0.1 % Apply 1 application topically daily as needed.   . vitamin B-12 (CYANOCOBALAMIN) 1000 MCG tablet Take 1,000 mcg by mouth daily.  . vitamin C (ASCORBIC ACID) 500 MG tablet Take 500 mg by mouth daily.  [name] 40 MG capsule TAKE 3 CAPSULES (120 MG  TOTAL) BY MOUTH DAILY.    Past Medical History:  Diagnosis Date  . Aortic atherosclerosis ([place])    Noted on CT  . Arthritis   . Colon polyps    adenomatous  . History of fracture of tibia   . History of tachycardia    patient unaware of this  . Kyphosis of cervicothoracic region    SEVERE  . Osteoporosis 01/2014   T -3.8 (01/2014)  . Other constipation    chronic  . Prostate cancer ([place])    surgery then lupron  . Unspecified venous (peripheral) insufficiency   . Vitamin B12 deficiency     Past Surgical History:  Procedure Laterality Date  . BASAL CELL CARCINOMA EXCISION  2004   Left side of face  . CATARACT EXTRACTION Bilateral 2000, 2003   both eyes  . COLONOSCOPY     h/o adenomatous polyps  . CYSTOSCOPY W/ RETROGRADES Bilateral 11/19/2016   Procedure: CYSTOSCOPY WITH RETROGRADE PYELOGRAM;  Surgeon: Hollice Espy, MD;  Location: [facility];  Service: Urology;  Laterality: Bilateral;  General vs. Spinal  . CYSTOSCOPY W/ URETERAL STENT PLACEMENT Left 02/25/2017   Procedure: CYSTOSCOPY WITH STENT REPLACEMENT;  Surgeon: Hollice Espy, MD;  Location: [facility];  Service: Urology;  Laterality: Left;  . CYSTOSCOPY WITH URETEROSCOPY AND STENT PLACEMENT Left 11/19/2016   Procedure: CYSTOSCOPY WITH URETEROSCOPY AND STENT PLACEMENT;  Surgeon: Hollice Espy, MD;  Location: [facility];  Service: Urology;  Laterality: Left;  . FRACTURE SURGERY    . JOINT REPLACEMENT  2005   Left knee  . JOINT REPLACEMENT  12/13   Right knee  . PROSTATE SURGERY  1996   cancer  . sigmoid resection    . TIBIA FRACTURE SURGERY  1999  . URETERAL BIOPSY Left 11/19/2016   Procedure: URETERAL BIOPSY;  Surgeon: Hollice Espy, MD;  Location: [facility];  Service: Urology;  Laterality: Left;  Marland Kitchen VOLVULUS REDUCTION  12/10    Social History Social History   Tobacco Use  . Smoking status: Never Smoker  . Smokeless tobacco: Never Used  Substance Use Topics  . Alcohol use: No    Alcohol/week: 0.0 - 0.6 oz      Frequency: Never    Comment: in past  . Drug use: No    Family History Family History  Problem Relation Age of Onset  . Leukemia Mother   . Diabetes Son   . Prostate cancer Neg Hx   . Bladder Cancer Neg Hx   . Kidney cancer Neg Hx     Allergies  Allergen Reactions  . Other     RAW ONIONS- SINUS REACTION     REVIEW OF SYSTEMS (Negative unless checked)  Constitutional: [] Weight loss  [] Fever  [] Chills Cardiac: [] Chest pain   [] Chest pressure   [] Palpitations   [] Shortness of breath when laying flat   [] Shortness of breath with exertion. Vascular:  [] Pain in legs with walking   [x] Pain in legs at rest  [] History of DVT   []   Phlebitis   [x] Swelling in legs   [] Varicose veins   [] Non-healing ulcers Pulmonary:   [] Uses home oxygen   [] Productive cough   [] Hemoptysis   [] Wheeze  [] COPD   [] Asthma Neurologic:  [] Dizziness   [] Seizures   [x] History of stroke   [] History of TIA  [] Aphasia   [] Vissual changes   [] Weakness or numbness in arm   [x] Weakness or numbness in leg Musculoskeletal:   [] Joint swelling   [] Joint pain   [] Low back pain Hematologic:  [] Easy bruising  [] Easy bleeding   [] Hypercoagulable state   [] Anemic Gastrointestinal:  [] Diarrhea   [] Vomiting  [] Gastroesophageal reflux/heartburn   [] Difficulty swallowing. Genitourinary:  [] Chronic kidney disease   [] Difficult urination  [] Frequent urination   [] Blood in urine Skin:  [] Rashes   [] Ulcers  Psychological:  [] History of anxiety   []  History of major depression.  Physical Examination  Vitals:   07/16/17 1534  BP: 131/68  Pulse: 70  Resp: 15  Weight: 181 lb (82.1 kg)  Height: 6' (1.829 m)   Body mass index is 24.55 kg/m. Gen: WD/WN, mild distress seen in a wheelchair patient with obvious neurologic deficit unable to move right arm right leg and facial droop she essentially is noncommunicative Head: [place]/AT, No temporalis wasting.  Ear/Nose/Throat: Hearing grossly intact, nares w/o erythema or drainage Eyes:  PER, EOMI, sclera nonicteric.  Neck: Supple, no large masses.   Pulmonary:  Good air movement, no audible wheezing bilaterally, no use of accessory muscles.  Cardiac: RRR, no JVD Vascular: There is tremendous improvement in the edema with only trace pitting bilaterally Vessel Right Left  Radial Palpable Palpable  Gastrointestinal: Non-distended. No guarding/no peritoneal signs.  Musculoskeletal: M/S 5/5 throughout.  No deformity or atrophy.  Neurologic: CN 2-12 intact. Symmetrical.  Speech is fluent. Motor exam as listed above. Psychiatric: Judgment intact, Mood & affect appropriate for pt's clinical situation. Dermatologic: Moderate venous rashes right leg no ulcers noted.  No changes consistent with cellulitis. Lymph : No lichenification or skin changes of chronic lymphedema.  CBC Lab Results  Component Value Date   WBC 6.2 06/24/2017   HGB 12.7 (A) 07/13/2017   HCT 36 (A) 07/13/2017   MCV 90.8 06/24/2017   PLT 143 (L) 06/24/2017    BMET    Component Value Date/Time   NA 134 (L) 06/24/2017 1031   NA 140 02/11/2012 0415   K 4.3 06/24/2017 1031   K 3.9 02/11/2012 0415   CL 106 06/24/2017 1031   CL 108 (H) 02/11/2012 0415   CO2 20 (L) 06/24/2017 1031   CO2 25 02/11/2012 0415   GLUCOSE 169 (H) 06/24/2017 1031   GLUCOSE 125 (H) 02/11/2012 0415   BUN 18 06/24/2017 1031   BUN 12 02/11/2012 0415   CREATININE 1.01 06/24/2017 1031   CREATININE 1.00 03/23/2014 1400   CALCIUM 9.0 06/24/2017 1031   CALCIUM 8.3 (L) 03/23/2014 1400   GFRNONAA >60 06/24/2017 1031   GFRNONAA >60 03/23/2014 1400   GFRNONAA 49 (L) 09/02/2013 1428   GFRAA >60 06/24/2017 1031   GFRAA >60 03/23/2014 1400   GFRAA 57 (L) 09/02/2013 1428   CrCl cannot be calculated (Patient's most recent lab result is older than the maximum 21 days allowed.).  COAG Lab Results  Component Value Date   INR 0.9 02/02/2012    Radiology No results found.    Assessment/Plan 1. Lymphedema No surgery or intervention  at this point in time.    I have reviewed my discussion with the patient  regarding venous insufficiency and secondary lymph edema and why it  causes symptoms. I have discussed with the patient the chronic skin changes that accompany these problems and the long term sequela such as ulceration and infection.  Patient will continue wearing graduated compression stockings class 1 (20-30 mmHg) on a daily basis a prescription was given to the patient to keep this updated. The patient will  put the stockings on first thing in the morning and removing them in the evening. The patient is instructed specifically not to sleep in the stockings.  In addition, behavioral modification including elevation during the day will be continued.  Diet and salt restriction was also discussed.  Previous duplex ultrasound of the lower extremities shows normal deep venous system, superficial reflux was not present.   The patient has had such good improvement with compression that in discussion with her husband we will hold off on a lymph pump at this time.  She can be reassessed if the need arises.  2. Chronic venous insufficiency See #1  3. Primary osteoarthritis involving multiple joints Continue NSAID medications as already ordered, these medications have been reviewed and there are no changes at this time.  Continued activity and therapy was stressed.   4. Brain stem stroke syndrome Continue antiplatelet therapy as prescribed  5. Aortic atherosclerosis ([facility])  Recommend:  The patient has evidence of atherosclerosis of the lower extremities with claudication.  The patient does not voice lifestyle limiting changes at this point in time.  Noninvasive studies do not suggest clinically significant change.  No invasive studies, angiography or surgery at this time The patient should continue walking and begin a more formal exercise program.  The patient should continue antiplatelet therapy and aggressive treatment of  the lipid abnormalities  No changes in the patient's medications at this time  The patient should continue wearing graduated compression socks 10-15 mmHg strength to control the mild edema.    Hortencia Pilar, MD  07/16/2017 3:37 PM

## 2017-07-18 NOTE — Progress Notes (Signed)
   HPI: 84 year old male with a history of chronic venous insufficiency and cellulitis with peripheral neuropathy presents the office today for evaluation regarding follow-up for a toe fracture to the left foot.  Patient states that for the past month he has been concerned regarding swelling and redness to his left leg and foot.  He denies any pain or trauma.  He also has painful callus lesions to the bottom of his right foot.  No alleviating or aggravating factors other than ambulation.  These been painful for the past 2 to 3 weeks.  He presents today for further treatment evaluation The patient is also no longer symptomatic for the toe fracture.  This fracture occurred greater than 4 months ago.  He has had no complications with healing.  Past Medical History:  Diagnosis Date  . Aortic atherosclerosis ([place])    Noted on CT  . Arthritis   . Colon polyps    adenomatous  . History of fracture of tibia   . History of tachycardia    patient unaware of this  . Kyphosis of cervicothoracic region    SEVERE  . Osteoporosis 01/2014   T -3.8 (01/2014)  . Other constipation    chronic  . Prostate cancer ([place])    surgery then lupron  . Unspecified venous (peripheral) insufficiency   . Vitamin B12 deficiency      Physical Exam: General: The patient is alert and oriented x3 in no acute distress.  Dermatology: Skin is warm, dry and supple bilateral lower extremities. Negative for open lesions or macerations.  There is some erythema with edema noted to the left foot and leg consistent with a possible chronic cellulitis.  Please see attached photo. Hyperkeratotic callus lesions also noted to the weightbearing surfaces of the right foot.   Vascular: Palpable pedal pulses bilaterally. No edema or erythema noted. Capillary refill within normal limits.  Neurological: Epicritic and protective threshold diminished bilaterally.   Musculoskeletal Exam: Range of motion within normal limits to all pedal  and ankle joints bilateral. Muscle strength 5/5 in all groups bilateral.   Assessment: 1.  Left foot and leg cellulitis versus venous insufficiency 2.  Callus lesions right foot   Plan of Care:  1. Patient evaluated. 2.  Today we are going to prescribe doxycycline 100 mg #20 twice daily.  We will see if this improves his symptoms of possible cellulitis versus venous insufficiency 3.  Return to clinic in 2 weeks      Edrick Kins, DPM [facility]  Dr. Edrick Kins, DPM    [address], [place] [address]                Office [phone]  Fax [phone]

## 2017-07-20 ENCOUNTER — Encounter (INDEPENDENT_AMBULATORY_CARE_PROVIDER_SITE_OTHER): Payer: Self-pay | Admitting: Vascular Surgery

## 2017-07-21 DIAGNOSIS — L03119 Cellulitis of unspecified part of limb: Secondary | ICD-10-CM | POA: Diagnosis not present

## 2017-07-21 DIAGNOSIS — I872 Venous insufficiency (chronic) (peripheral): Secondary | ICD-10-CM | POA: Diagnosis not present

## 2017-07-23 ENCOUNTER — Encounter: Payer: Self-pay | Admitting: Internal Medicine

## 2017-07-23 ENCOUNTER — Ambulatory Visit: Payer: Medicare Other | Admitting: Internal Medicine

## 2017-07-23 VITALS — BP 118/62 | HR 62 | Temp 97.9°F | Resp 20 | Wt 184.8 lb

## 2017-07-23 DIAGNOSIS — K5909 Other constipation: Secondary | ICD-10-CM

## 2017-07-23 DIAGNOSIS — M150 Primary generalized (osteo)arthritis: Secondary | ICD-10-CM

## 2017-07-23 DIAGNOSIS — I872 Venous insufficiency (chronic) (peripheral): Secondary | ICD-10-CM

## 2017-07-23 DIAGNOSIS — M159 Polyosteoarthritis, unspecified: Secondary | ICD-10-CM

## 2017-07-23 DIAGNOSIS — C61 Malignant neoplasm of prostate: Secondary | ICD-10-CM

## 2017-07-23 NOTE — Assessment & Plan Note (Signed)
Chronic brawny changes Edema better with compression hose Dr Ola Spurr plans 3 months of PCN as preventative

## 2017-07-23 NOTE — Assessment & Plan Note (Signed)
Mild  No changes needed May have some tendonitis ----discussed trying ice prn

## 2017-07-23 NOTE — Progress Notes (Signed)
Subjective:    Patient ID: Benjamin Villarreal, male    DOB: 24-May-1920, 84 y.o.   MRN: [id number]  HPI Visit in his AL room for follow up of chronic health conditions Reviewed status with Luellen Pucker RN Reviewed notes from podiatry, ID, vascular, oncology  Had brief course of doxy from podiatrist for persistent redness in left calf Vascular surgeon recommended continuing hose--but no infection noted Dr Ola Spurr didn't note acute infection but has decided on 3 months of suppressive Rx with PCN No sig edema with compression stockings on  Has been tolerating prostate cancer Rx Known metastatic disease locally No side  Voids okay  Mild arthrits issues Right shoulder sore --?tendonitis (hurts with movement along lateral arm)  Bowels have been good Fairly regular  Current Outpatient Medications on File Prior to Visit  Medication Sig Dispense Refill  . acetaminophen (TYLENOL) 325 MG tablet Take 325-650 mg by mouth every 4 (four) hours as needed for fever (general discomfort).     Marland Kitchen alum & mag hydroxide-simeth (MAALOX/MYLANTA) 200-200-20 MG/5ML suspension Take 30 mLs by mouth every 4 (four) hours as needed for indigestion or heartburn.    Marland Kitchen aspirin EC 81 MG tablet Take 81 mg by mouth every other day.    . Biotin 2.5 MG CAPS Take 2.5 mg by mouth daily.     Marland Kitchen bismuth subsalicylate (KAOPECTATE) 262 MG/15ML suspension Take 10 mLs by mouth 3 (three) times daily as needed for diarrhea or loose stools.     . Calcium Carbonate-Vitamin D (CALCIUM-D) 600-400 MG-UNIT TABS Take 1 tablet by mouth daily.     . carbamide peroxide (DEBROX) 6.5 % OTIC solution 5 drops daily as needed.    . cholecalciferol (VITAMIN D) 1000 UNITS tablet Take 1,000 Units by mouth 2 (two) times daily.     . cyclobenzaprine (FLEXERIL) 5 MG tablet Take 5 mg by mouth at bedtime as needed for muscle spasms.    Marland Kitchen dextromethorphan-guaiFENesin (ROBITUSSIN-DM) 10-100 MG/5ML liquid Take 10 mLs by mouth every 4 (four) hours as needed for  cough.    . diphenhydrAMINE (BENADRYL) 25 mg capsule Take 25 mg by mouth every 4 (four) hours as needed for itching or allergies.    . fluPHENAZine decanoate (PROLIXIN) 25 MG/ML injection Inject into the muscle.    . fluticasone (FLONASE) 50 MCG/ACT nasal spray Place 1 spray into both nostrils daily.    Marland Kitchen HYDROcodone-acetaminophen (NORCO/VICODIN) 5-325 MG tablet Take 1 tablet by mouth every 6 (six) hours as needed for moderate pain. 30 tablet 0  . ibuprofen (ADVIL,MOTRIN) 200 MG tablet Take 400 mg by mouth 2 (two) times daily as needed for headache or mild pain.     Marland Kitchen latanoprost (XALATAN) 0.005 % ophthalmic solution Place 1 drop into both eyes at bedtime.    Marland Kitchen loratadine (CLARITIN) 10 MG tablet Take 20 mg by mouth at bedtime.    . magnesium hydroxide (MILK OF MAGNESIA) 400 MG/5ML suspension Take 30 mLs by mouth daily as needed for mild constipation.    . mupirocin ointment (BACTROBAN) 2 % Place 1 application into the nose 2 (two) times daily. 22 g 0  . nystatin (NYSTATIN) powder Apply 1 g topically 2 (two) times daily as needed (rash). Use 1 application twice daily as needed for rash    . oseltamivir (TAMIFLU) 75 MG capsule     . penicillin v potassium (VEETID) 500 MG tablet Take 500 mg by mouth 2 (two) times daily.    . polyethylene glycol powder (GLYCOLAX/MIRALAX) powder FILL  TO LINE (19 GRAMS ), MIX AND DRINK TWICE A DAY AND 8.5 GRAMS AT NOON 510 g 3  . sennosides-docusate sodium (SENOKOT-S) 8.6-50 MG tablet Take 4 tablets by mouth every evening.     . Skin Protectants, Misc. (EUCERIN) cream Apply 1 application topically daily.    . sodium chloride (OCEAN) 0.65 % SOLN nasal spray Place 1-2 sprays into both nostrils 2 (two) times daily as needed for congestion.    . traMADol (ULTRAM) 50 MG tablet Take 1 tablet (50 mg total) by mouth every 6 (six) hours as needed for moderate pain. 30 tablet 0  . triamcinolone cream (KENALOG) 0.1 % Apply 1 application topically daily as needed.     . vitamin  B-12 (CYANOCOBALAMIN) 1000 MCG tablet Take 1,000 mcg by mouth daily.    . vitamin C (ASCORBIC ACID) 500 MG tablet Take 500 mg by mouth daily.    [name] 40 MG capsule TAKE 3 CAPSULES (120 MG TOTAL) BY MOUTH DAILY. 90 capsule 4   Current Facility-Administered Medications on File Prior to Visit  Medication Dose Route Frequency Provider Last Rate Last Dose  . leuprolide (LUPRON) injection 30 mg  30 mg Intramuscular Once Leia Alf, MD        Allergies  Allergen Reactions  . Other     RAW ONIONS- SINUS REACTION    Past Medical History:  Diagnosis Date  . Aortic atherosclerosis ([place])    Noted on CT  . Arthritis   . Colon polyps    adenomatous  . History of fracture of tibia   . History of tachycardia    patient unaware of this  . Kyphosis of cervicothoracic region    SEVERE  . Osteoporosis 01/2014   T -3.8 (01/2014)  . Other constipation    chronic  . Prostate cancer ([place])    surgery then lupron  . Unspecified venous (peripheral) insufficiency   . Vitamin B12 deficiency     Past Surgical History:  Procedure Laterality Date  . BASAL CELL CARCINOMA EXCISION  2004   Left side of face  . CATARACT EXTRACTION Bilateral 2000, 2003   both eyes  . COLONOSCOPY     h/o adenomatous polyps  . CYSTOSCOPY W/ RETROGRADES Bilateral 11/19/2016   Procedure: CYSTOSCOPY WITH RETROGRADE PYELOGRAM;  Surgeon: Hollice Espy, MD;  Location: [facility] OR[facility];  Service: Urology;  Laterality: Bilateral;  General vs. Spinal  . CYSTOSCOPY W/ URETERAL STENT PLACEMENT Left 02/25/2017   Procedure: CYSTOSCOPY WITH STENT REPLACEMENT;  Surgeon: Hollice Espy, MD;  Location: [facility];  Service: Urology;  Laterality: Left;  . CYSTOSCOPY WITH URETEROSCOPY AND STENT PLACEMENT Left 11/19/2016   Procedure: CYSTOSCOPY WITH URETEROSCOPY AND STENT PLACEMENT;  Surgeon: Hollice Espy, MD;  Location: [facility] OR[facility];  Service: Urology;  Laterality: Left;  . FRACTURE SURGERY    . JOINT REPLACEMENT  2005   Left knee  . JOINT  REPLACEMENT  12/13   Right knee  . PROSTATE SURGERY  1996   cancer  . sigmoid resection    . TIBIA FRACTURE SURGERY  1999  . URETERAL BIOPSY Left 11/19/2016   Procedure: URETERAL BIOPSY;  Surgeon: Hollice Espy, MD;  Location: [facility];  Service: Urology;  Laterality: Left;  Marland Kitchen VOLVULUS REDUCTION  12/10    Family History  Problem Relation Age of Onset  . Leukemia Mother   . Diabetes Son   . Prostate cancer Neg Hx   . Bladder Cancer Neg Hx   . Kidney cancer Neg Hx  Social History   Socioeconomic History  . Marital status: Widowed    Spouse name: [name]  . Number of children: 3  . Years of education: Not on file  . Highest education level: Not on file  Occupational History  . 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 Financial resource strain: Not on file  . Food insecurity:    Worry: Not on file    Inability: Not on file  . Transportation needs:    Medical: Not on file    Non-medical: Not on file  Tobacco Use  . Smoking status: Never Smoker  . Smokeless tobacco: Never Used  Substance and Sexual Activity  . Alcohol use: No    Alcohol/week: 0.0 - 0.6 oz    Frequency: Never    Comment: in past  . Drug use: No  . Sexual activity: Not on file  Lifestyle  . Physical activity:    Days per week: Not on file    Minutes per session: Not on file  . Stress: Not on file  Relationships  . Social connections:    Talks on phone: Not on file    Gets together: Not on file    Attends religious service: Not on file    Active member of club or organization: Not on file    Attends meetings of clubs or organizations: Not on file    Relationship status: Not on file  . Intimate partner violence:    Fear of current or ex partner: Not on file    Emotionally abused: Not on file    Physically abused: Not on file    Forced sexual activity: Not on file  Other Topics Concern  . Not on file  Social History Narrative   Has living will   Daughter [name] holds health care POA.   Has DNR and we have reviewed this.   Would not want tube feedings if cognitively unaware   Review of Systems  Appetite is fine Weight stable Sleeping fine     Objective:   Physical Exam  Constitutional: He appears well-developed. No distress.  Neck: No thyromegaly present.  Cardiovascular: Normal rate and regular rhythm. Exam reveals no gallop.  Gr 3/6 systolic murmur loudest at base  Respiratory: Effort normal and breath sounds normal. No respiratory distress. He has no wheezes. He has no rales.  GI: Soft. There is no tenderness.  Musculoskeletal: He exhibits no edema.  Skin:  Red/brawny changes along lateral left calf from ankle up ~15cm No ulcers  Psychiatric: He has a normal mood and affect. His behavior is normal.           Assessment & Plan:

## 2017-07-23 NOTE — Assessment & Plan Note (Signed)
Ongoing cancer with local metastases On xtandi and seems to be doing okay Continues with Dr Burlene Arnt

## 2017-07-23 NOTE — Assessment & Plan Note (Signed)
Bowels have been moving okay

## 2017-07-28 ENCOUNTER — Ambulatory Visit: Payer: TRICARE For Life (TFL) | Admitting: Podiatry

## 2017-07-28 DIAGNOSIS — I872 Venous insufficiency (chronic) (peripheral): Secondary | ICD-10-CM | POA: Diagnosis not present

## 2017-08-03 DIAGNOSIS — L03116 Cellulitis of left lower limb: Secondary | ICD-10-CM | POA: Diagnosis not present

## 2017-08-03 DIAGNOSIS — L820 Inflamed seborrheic keratosis: Secondary | ICD-10-CM | POA: Diagnosis not present

## 2017-08-03 DIAGNOSIS — L821 Other seborrheic keratosis: Secondary | ICD-10-CM | POA: Diagnosis not present

## 2017-08-05 ENCOUNTER — Other Ambulatory Visit: Payer: Self-pay

## 2017-08-05 ENCOUNTER — Telehealth: Payer: Self-pay | Admitting: Pharmacist

## 2017-08-05 ENCOUNTER — Inpatient Hospital Stay (HOSPITAL_BASED_OUTPATIENT_CLINIC_OR_DEPARTMENT_OTHER): Payer: Medicare Other | Admitting: Oncology

## 2017-08-05 ENCOUNTER — Inpatient Hospital Stay: Payer: Medicare Other | Attending: Oncology

## 2017-08-05 VITALS — BP 130/69 | HR 60 | Temp 98.5°F | Resp 16 | Wt 180.2 lb

## 2017-08-05 DIAGNOSIS — C61 Malignant neoplasm of prostate: Secondary | ICD-10-CM

## 2017-08-05 DIAGNOSIS — Z79899 Other long term (current) drug therapy: Secondary | ICD-10-CM | POA: Insufficient documentation

## 2017-08-05 DIAGNOSIS — E538 Deficiency of other specified B group vitamins: Secondary | ICD-10-CM

## 2017-08-05 DIAGNOSIS — D509 Iron deficiency anemia, unspecified: Secondary | ICD-10-CM | POA: Insufficient documentation

## 2017-08-05 DIAGNOSIS — N1330 Unspecified hydronephrosis: Secondary | ICD-10-CM | POA: Diagnosis not present

## 2017-08-05 DIAGNOSIS — Z192 Hormone resistant malignancy status: Secondary | ICD-10-CM | POA: Insufficient documentation

## 2017-08-05 DIAGNOSIS — R5383 Other fatigue: Secondary | ICD-10-CM | POA: Diagnosis not present

## 2017-08-05 DIAGNOSIS — Z806 Family history of leukemia: Secondary | ICD-10-CM | POA: Diagnosis not present

## 2017-08-05 DIAGNOSIS — R531 Weakness: Secondary | ICD-10-CM | POA: Insufficient documentation

## 2017-08-05 DIAGNOSIS — Z7982 Long term (current) use of aspirin: Secondary | ICD-10-CM | POA: Diagnosis not present

## 2017-08-05 DIAGNOSIS — M13811 Other specified arthritis, right shoulder: Secondary | ICD-10-CM | POA: Diagnosis not present

## 2017-08-05 DIAGNOSIS — K5900 Constipation, unspecified: Secondary | ICD-10-CM | POA: Insufficient documentation

## 2017-08-05 DIAGNOSIS — Z86010 Personal history of colonic polyps: Secondary | ICD-10-CM

## 2017-08-05 DIAGNOSIS — I700 Atherosclerosis of aorta: Secondary | ICD-10-CM | POA: Insufficient documentation

## 2017-08-05 DIAGNOSIS — I739 Peripheral vascular disease, unspecified: Secondary | ICD-10-CM | POA: Diagnosis not present

## 2017-08-05 DIAGNOSIS — M79672 Pain in left foot: Secondary | ICD-10-CM | POA: Diagnosis not present

## 2017-08-05 DIAGNOSIS — M129 Arthropathy, unspecified: Secondary | ICD-10-CM | POA: Diagnosis not present

## 2017-08-05 DIAGNOSIS — Z85828 Personal history of other malignant neoplasm of skin: Secondary | ICD-10-CM | POA: Insufficient documentation

## 2017-08-05 DIAGNOSIS — R232 Flushing: Secondary | ICD-10-CM | POA: Diagnosis not present

## 2017-08-05 DIAGNOSIS — C778 Secondary and unspecified malignant neoplasm of lymph nodes of multiple regions: Secondary | ICD-10-CM | POA: Diagnosis not present

## 2017-08-05 DIAGNOSIS — M810 Age-related osteoporosis without current pathological fracture: Secondary | ICD-10-CM

## 2017-08-05 LAB — COMPREHENSIVE METABOLIC PANEL
ALBUMIN: 4 g/dL (ref 3.5–5.0)
ALK PHOS: 49 U/L (ref 38–126)
ALT: 12 U/L — ABNORMAL LOW (ref 17–63)
AST: 22 U/L (ref 15–41)
Anion gap: 8 (ref 5–15)
BILIRUBIN TOTAL: 0.7 mg/dL (ref 0.3–1.2)
BUN: 19 mg/dL (ref 6–20)
CALCIUM: 9.2 mg/dL (ref 8.9–10.3)
CO2: 20 mmol/L — ABNORMAL LOW (ref 22–32)
CREATININE: 0.91 mg/dL (ref 0.61–1.24)
Chloride: 107 mmol/L (ref 101–111)
GFR calc Af Amer: >60 mL/min (ref >60)
GLUCOSE: 163 mg/dL — AB (ref 65–99)
Potassium: 4.4 mmol/L (ref 3.5–5.1)
Sodium: 135 mmol/L (ref 135–145)
Total Protein: 7.1 g/dL (ref 6.5–8.1)

## 2017-08-05 LAB — CBC WITH DIFFERENTIAL/PLATELET
BASOS ABS: 0.1 10*3/uL (ref 0–0.1)
BASOS PCT: 2 %
EOS ABS: 0.2 10*3/uL (ref 0–0.7)
EOS PCT: 4 %
HCT: 35.5 % — ABNORMAL LOW (ref 40.0–52.0)
Hemoglobin: 12.2 g/dL — ABNORMAL LOW (ref 13.0–18.0)
Lymphocytes Relative: 31 %
Lymphs Abs: 1.7 10*3/uL (ref 1.0–3.6)
MCH: 31.2 pg (ref 26.0–34.0)
MCHC: 34.4 g/dL (ref 32.0–36.0)
MCV: 90.7 fL (ref 80.0–100.0)
MONO ABS: 0.5 10*3/uL (ref 0.2–1.0)
Monocytes Relative: 9 %
Neutro Abs: 3 10*3/uL (ref 1.4–6.5)
Neutrophils Relative %: 54 %
PLATELETS: 149 10*3/uL — AB (ref 150–440)
RBC: 3.91 MIL/uL — ABNORMAL LOW (ref 4.40–5.90)
RDW: 13.7 % (ref 11.5–14.5)
WBC: 5.6 10*3/uL (ref 3.8–10.6)

## 2017-08-05 LAB — PSA: PROSTATIC SPECIFIC ANTIGEN: 1.58 ng/mL (ref 0.00–4.00)

## 2017-08-05 NOTE — Telephone Encounter (Signed)
Oral Chemotherapy Pharmacist Encounter  Follow-Up Form  Called patient today to follow up regarding patient's oral chemotherapy medication: Xtandi (enzalutamide)  Original Start date of oral chemotherapy: 11/2016  Pt reports 0 tablets/doses of Xtandi missed in the last month.   Pt reports the following side effects: None reported  Recent labs reviewed: PSA from 06/24/17  New medications?: None reported  Other Issues: None reported  Patient knows to call the office with questions or concerns. Oral Oncology Clinic will continue to follow.  Darl Pikes, PharmD, BCPS Hematology/Oncology Clinical Pharmacist [facility] [phone]  08/05/2017 10:44 AM

## 2017-08-05 NOTE — Progress Notes (Signed)
OFFICE PROGRESS NOTE  Patient Care Team: Venia Carbon, MD as PCP - General   SUMMARY OF ONCOLOGIC HISTORY: Oncology History    # 1996 PROSTATE CANCER [Gleason score 2+3] s/p Radical Prostatectomy [[facility]]; ?Biochem RECURRENT PROSTATE CANCER-Hormone sensitive;  Intermittent Lupron; on Lupron q 45M [Jan 2016 PSA- 7.9].  HOLD LUPRON in MAY 2017; AUg 2017- Re-start  # OCT 2018- Xtandi 3 pills a day; OCT 30th PET- Retroperitoneal/pelvic adenopathy/recurrence lateral bladder wall; NO Bone lesions.   # Osteoporosis [BMD- 4098]- on Reclast q 46M; FEB 2017- Start Prolia q 6 M     Prostate cancer [facility])    INTERVAL HISTORY: 84 year old male with history of castrate resistant recurrent prostate cancer currently on Lupron/Xtandi since October 2018.  He is accompanied by his daughter.  Patient complains of mild fatigue and occasional hot flashes.  He denies any seizure-like activity, abdominal pain, nausea or vomiting.  States he is doing pretty good.  Remains active.  REVIEW OF SYSTEMS:   Review of Systems  Constitutional: Positive for malaise/fatigue. Negative for chills, fever and weight loss.       Occasional hot flashes  HENT: Negative for congestion and ear pain.   Eyes: Negative.  Negative for blurred vision and double vision.  Respiratory: Negative.  Negative for cough, sputum production and shortness of breath.   Cardiovascular: Negative.  Negative for chest pain, palpitations and leg swelling.  Gastrointestinal: Negative.  Negative for abdominal pain, constipation, diarrhea, nausea and vomiting.  Genitourinary: Negative for dysuria, frequency and urgency.  Musculoskeletal: Negative for back pain and falls.  Skin: Negative.  Negative for rash.  Neurological: Negative.  Negative for weakness and headaches.  Endo/Heme/Allergies: Negative.  Does not bruise/bleed easily.  Psychiatric/Behavioral: Negative.  Negative for depression. The patient is not nervous/anxious and does not have  insomnia.     PAST MEDICAL HISTORY :  Past Medical History:  Diagnosis Date  . Aortic atherosclerosis ([place])    Noted on CT  . Arthritis   . Colon polyps    adenomatous  . History of fracture of tibia   . History of tachycardia    patient unaware of this  . Kyphosis of cervicothoracic region    SEVERE  . Osteoporosis 01/2014   T -3.8 (01/2014)  . Other constipation    chronic  . Prostate cancer ([place])    surgery then lupron  . Unspecified venous (peripheral) insufficiency   . Vitamin B12 deficiency     PAST SURGICAL HISTORY :   Past Surgical History:  Procedure Laterality Date  . BASAL CELL CARCINOMA EXCISION  2004   Left side of face  . CATARACT EXTRACTION Bilateral 2000, 2003   both eyes  . COLONOSCOPY     h/o adenomatous polyps  . CYSTOSCOPY W/ RETROGRADES Bilateral 11/19/2016   Procedure: CYSTOSCOPY WITH RETROGRADE PYELOGRAM;  Surgeon: Hollice Espy, MD;  Location: [facility] ORS;  Service: Urology;  Laterality: Bilateral;  General vs. Spinal  . CYSTOSCOPY W/ URETERAL STENT PLACEMENT Left 02/25/2017   Procedure: CYSTOSCOPY WITH STENT REPLACEMENT;  Surgeon: Hollice Espy, MD;  Location: [facility];  Service: Urology;  Laterality: Left;  . CYSTOSCOPY WITH URETEROSCOPY AND STENT PLACEMENT Left 11/19/2016   Procedure: CYSTOSCOPY WITH URETEROSCOPY AND STENT PLACEMENT;  Surgeon: Hollice Espy, MD;  Location: [facility] OR[facility];  Service: Urology;  Laterality: Left;  . FRACTURE SURGERY    . JOINT REPLACEMENT  2005   Left knee  . JOINT REPLACEMENT  12/13   Right knee  . PROSTATE SURGERY  1996   cancer  . sigmoid resection    . TIBIA FRACTURE SURGERY  1999  . URETERAL BIOPSY Left 11/19/2016   Procedure: URETERAL BIOPSY;  Surgeon: Hollice Espy, MD;  Location: [facility] OR[facility];  Service: Urology;  Laterality: Left;  Marland Kitchen VOLVULUS REDUCTION  12/10    FAMILY HISTORY :   Family History  Problem Relation Age of Onset  . Leukemia Mother   . Diabetes Son   . Prostate cancer Neg Hx   . Bladder  Cancer Neg Hx   . Kidney cancer Neg Hx     SOCIAL HISTORY:   Social History   Tobacco Use  . Smoking status: Never Smoker  . Smokeless tobacco: Never Used  Substance Use Topics  . Alcohol use: No    Alcohol/week: 0.0 - 0.6 oz    Frequency: Never    Comment: in past  . Drug use: No    ALLERGIES:  is allergic to other.  MEDICATIONS:  Current Outpatient Medications  Medication Sig Dispense Refill  . acetaminophen (TYLENOL) 325 MG tablet Take 325-650 mg by mouth every 4 (four) hours as needed for fever (general discomfort).     Marland Kitchen alum & mag hydroxide-simeth (MAALOX/MYLANTA) 200-200-20 MG/5ML suspension Take 30 mLs by mouth every 4 (four) hours as needed for indigestion or heartburn.    Marland Kitchen aspirin EC 81 MG tablet Take 81 mg by mouth every other day.    . Biotin 2.5 MG CAPS Take 2.5 mg by mouth daily.     Marland Kitchen bismuth subsalicylate (KAOPECTATE) 262 MG/15ML suspension Take 10 mLs by mouth 3 (three) times daily as needed for diarrhea or loose stools.     . Calcium Carbonate-Vitamin D (CALCIUM-D) 600-400 MG-UNIT TABS Take 1 tablet by mouth daily.     . carbamide peroxide (DEBROX) 6.5 % OTIC solution 5 drops daily as needed.    . cholecalciferol (VITAMIN D) 1000 UNITS tablet Take 1,000 Units by mouth 2 (two) times daily.     . cyclobenzaprine (FLEXERIL) 5 MG tablet Take 5 mg by mouth at bedtime as needed for muscle spasms.    Marland Kitchen dextromethorphan-guaiFENesin (ROBITUSSIN-DM) 10-100 MG/5ML liquid Take 10 mLs by mouth every 4 (four) hours as needed for cough.    . diphenhydrAMINE (BENADRYL) 25 mg capsule Take 25 mg by mouth every 4 (four) hours as needed for itching or allergies.    . fluPHENAZine decanoate (PROLIXIN) 25 MG/ML injection Inject into the muscle.    . fluticasone (FLONASE) 50 MCG/ACT nasal spray Place 1 spray into both nostrils daily.    Marland Kitchen HYDROcodone-acetaminophen (NORCO/VICODIN) 5-325 MG tablet Take 1 tablet by mouth every 6 (six) hours as needed for moderate pain. 30 tablet 0  .  ibuprofen (ADVIL,MOTRIN) 200 MG tablet Take 400 mg by mouth 2 (two) times daily as needed for headache or mild pain.     Marland Kitchen latanoprost (XALATAN) 0.005 % ophthalmic solution Place 1 drop into both eyes at bedtime.    Marland Kitchen loratadine (CLARITIN) 10 MG tablet Take 20 mg by mouth at bedtime.    . magnesium hydroxide (MILK OF MAGNESIA) 400 MG/5ML suspension Take 30 mLs by mouth daily as needed for mild constipation.    . mupirocin ointment (BACTROBAN) 2 % Place 1 application into the nose 2 (two) times daily. 22 g 0  . nystatin (NYSTATIN) powder Apply 1 g topically 2 (two) times daily as needed (rash). Use 1 application twice daily as needed for rash    . oseltamivir (TAMIFLU) 75 MG capsule     .  penicillin v potassium (VEETID) 500 MG tablet Take 500 mg by mouth 2 (two) times daily.    . polyethylene glycol powder (GLYCOLAX/MIRALAX) powder FILL TO LINE (17 GRAMS ), MIX AND DRINK TWICE A DAY AND 8.5 GRAMS AT NOON 510 g 3  . sennosides-docusate sodium (SENOKOT-S) 8.6-50 MG tablet Take 4 tablets by mouth every evening.     . Skin Protectants, Misc. (EUCERIN) cream Apply 1 application topically daily.    . sodium chloride (OCEAN) 0.65 % SOLN nasal spray Place 1-2 sprays into both nostrils 2 (two) times daily as needed for congestion.    . traMADol (ULTRAM) 50 MG tablet Take 1 tablet (50 mg total) by mouth every 6 (six) hours as needed for moderate pain. 30 tablet 0  . triamcinolone cream (KENALOG) 0.1 % Apply 1 application topically daily as needed.     . vitamin B-12 (CYANOCOBALAMIN) 1000 MCG tablet Take 1,000 mcg by mouth daily.    . vitamin C (ASCORBIC ACID) 500 MG tablet Take 500 mg by mouth daily.    [name] 40 MG capsule TAKE 3 CAPSULES (120 MG TOTAL) BY MOUTH DAILY. 90 capsule 4   No current facility-administered medications for this visit.    Facility-Administered Medications Ordered in Other Visits  Medication Dose Route Frequency Provider Last Rate Last Dose  . leuprolide (LUPRON) injection 30 mg   30 mg Intramuscular Once Leia Alf, MD        PHYSICAL EXAMINATION: ECOG PERFORMANCE STATUS: 1 - Symptomatic but completely ambulatory  BP 130/69 (BP Location: Left Arm, Patient Position: Sitting)   Pulse 60   Temp 98.5 F (36.9 C) (Tympanic)   Resp 16   Wt 180 lb 3.2 oz (81.7 kg)   BMI 24.44 kg/m   Filed Weights   08/05/17 1025  Weight: 180 lb 3.2 oz (81.7 kg)    Physical Exam  Constitutional: He is oriented to person, place, and time and well-developed, well-nourished, and in no distress. Vital signs are normal.  HENT:  Head: Normocephalic and atraumatic.  Eyes: Pupils are equal, round, and reactive to light.  Neck: Normal range of motion.  Cardiovascular: Normal rate, regular rhythm and normal heart sounds.  No murmur heard. Pulmonary/Chest: Effort normal and breath sounds normal. He has no wheezes.  Abdominal: Soft. Normal appearance and bowel sounds are normal. He exhibits no distension. There is no tenderness.  Musculoskeletal: Normal range of motion. He exhibits no edema.  Neurological: He is alert and oriented to person, place, and time. Gait normal.  Skin: Skin is warm and dry. No rash noted.  Psychiatric: Mood, memory, affect and judgment normal.    LABORATORY DATA:  I have reviewed the data as listed    Component Value Date/Time   NA 135 08/05/2017 0957   NA 140 02/11/2012 0415   K 4.4 08/05/2017 0957   K 3.9 02/11/2012 0415   CL 107 08/05/2017 0957   CL 108 (H) 02/11/2012 0415   CO2 20 (L) 08/05/2017 0957   CO2 25 02/11/2012 0415   GLUCOSE 163 (H) 08/05/2017 0957   GLUCOSE 125 (H) 02/11/2012 0415   BUN 19 08/05/2017 0957   BUN 12 02/11/2012 0415   CREATININE 0.91 08/05/2017 0957   CREATININE 1.00 03/23/2014 1400   CALCIUM 9.2 08/05/2017 0957   CALCIUM 8.3 (L) 03/23/2014 1400   PROT 7.1 08/05/2017 0957   PROT 7.0 03/07/2014 1414   ALBUMIN 4.0 08/05/2017 0957   ALBUMIN 3.7 03/07/2014 1414   AST 22 08/05/2017 0957  AST 16 03/07/2014 1414    ALT 12 (L) 08/05/2017 0957   ALT 19 03/07/2014 1414   ALKPHOS 49 08/05/2017 0957   ALKPHOS 69 03/07/2014 1414   BILITOT 0.7 08/05/2017 0957   BILITOT 0.3 03/07/2014 1414   GFRNONAA >60 08/05/2017 0957   GFRNONAA >60 03/23/2014 1400   GFRNONAA 49 (L) 09/02/2013 1428   GFRAA >60 08/05/2017 0957   GFRAA >60 03/23/2014 1400   GFRAA 57 (L) 09/02/2013 1428    No results found for: SPEP, UPEP  Lab Results  Component Value Date   WBC 5.6 08/05/2017   NEUTROABS 3.0 08/05/2017   HGB 12.2 (L) 08/05/2017   HCT 35.5 (L) 08/05/2017   MCV 90.7 08/05/2017   PLT 149 (L) 08/05/2017      Chemistry      Component Value Date/Time   NA 135 08/05/2017 0957   NA 140 02/11/2012 0415   K 4.4 08/05/2017 0957   K 3.9 02/11/2012 0415   CL 107 08/05/2017 0957   CL 108 (H) 02/11/2012 0415   CO2 20 (L) 08/05/2017 0957   CO2 25 02/11/2012 0415   BUN 19 08/05/2017 0957   BUN 12 02/11/2012 0415   CREATININE 0.91 08/05/2017 0957   CREATININE 1.00 03/23/2014 1400   GLU 114 06/30/2016      Component Value Date/Time   CALCIUM 9.2 08/05/2017 0957   CALCIUM 8.3 (L) 03/23/2014 1400   ALKPHOS 49 08/05/2017 0957   ALKPHOS 69 03/07/2014 1414   AST 22 08/05/2017 0957   AST 16 03/07/2014 1414   ALT 12 (L) 08/05/2017 0957   ALT 19 03/07/2014 1414   BILITOT 0.7 08/05/2017 0957   BILITOT 0.3 03/07/2014 1414     Results for TORON, BOWRING (MRN [id number]) as of 06/24/2017 11:08  Ref. Range 07/02/2016 13:44 10/03/2016 13:41 11/03/2016 09:49 12/01/2016 14:12 12/29/2016 09:38 02/09/2017 13:55 04/01/2017 11:01 05/13/2017 10:42  PSA Latest Ref Range: 0.00 - 4.00 ng/mL 3.98         Prostatic Specific Antigen Latest Ref Range: 0.00 - 4.00 ng/mL  5.37 (H) 8.02 (H) 8.99 (H) 2.53 3.68 3.72 2.84      IMPRESSION: 1. Radiotracer accumulation with in LEFT periaortic lymph nodes and LEFT common iliac lymph nodes consistent with prostate cancer metastasis. 2. Focal activity along the posterior LEFT aspect of the bladder  is concerning for local prostate cancer invasion. 3. No evidence skeletal metastasis.   Electronically Signed   By: Suzy Bouchard M.D.   On: 12/23/2016 15:20    ASSESSMENT & PLAN:   Primary prostate cancer with metastasis from prostate to other site [facility]) # #CASTRATE RESISTANT PROSTATE CANCER- Metastatic disease with left bladder invasion/obstruction. OCT 30th PET scan-  Pelvic/RP LN; left later bladder wall- possibly leading to left ureter obstruction/hydronephrosis  Continue Xtandi 3 pills daily.  PSA improving.  Continue Lupron every 3 months.  Last received Lupron on 06/24/2017.   Severe osteoporosis: Currently on Prolia.  Last given 2019 February.  Due for Prolia in August 2019.  Stent placement/hydronephrosis: Has scheduled follow-up with Dr. Erlene Quan on 09/08/2017 for possible stent exchange.  Iron deficiency anemia: Denies any bleeding.  Tolerating oral iron.  Hemoglobin remains stable 12.2. MCV 90.   Follow up in 6 weeks/labs- cbc/cmp/psa, Lupron and Prolia.     Jacquelin Hawking, NP 08/10/2017 11:52 AM

## 2017-08-10 NOTE — Assessment & Plan Note (Addendum)
# #  CASTRATE RESISTANT PROSTATE CANCER- Metastatic disease with left bladder invasion/obstruction. OCT 30th PET scan-  Pelvic/RP LN; left later bladder wall- possibly leading to left ureter obstruction/hydronephrosis  Continue Xtandi 3 pills daily.  PSA improving.  Continue Lupron every 3 months.  Last received Lupron on 06/24/2017.   Severe osteoporosis: Currently on Prolia.  Last given 2019 February.  Due for Prolia in August 2019.  Stent placement/hydronephrosis: Has scheduled follow-up with Dr. Erlene Quan on 09/08/2017 for possible stent exchange.  Iron deficiency anemia: Denies any bleeding.  Tolerating oral iron.  Hemoglobin remains stable 12.2. MCV 90.   Follow up in 6 weeks/labs- cbc/cmp/psa, Lupron and Prolia.

## 2017-08-25 ENCOUNTER — Ambulatory Visit: Payer: Medicare Other | Admitting: Urology

## 2017-09-01 DIAGNOSIS — I872 Venous insufficiency (chronic) (peripheral): Secondary | ICD-10-CM | POA: Diagnosis not present

## 2017-09-08 ENCOUNTER — Encounter: Payer: Self-pay | Admitting: Urology

## 2017-09-08 ENCOUNTER — Ambulatory Visit (INDEPENDENT_AMBULATORY_CARE_PROVIDER_SITE_OTHER): Payer: Medicare Other | Admitting: Urology

## 2017-09-08 VITALS — BP 124/65 | HR 68 | Resp 16 | Ht 74.0 in | Wt 182.0 lb

## 2017-09-08 DIAGNOSIS — Z01818 Encounter for other preprocedural examination: Secondary | ICD-10-CM | POA: Diagnosis not present

## 2017-09-08 DIAGNOSIS — C61 Malignant neoplasm of prostate: Secondary | ICD-10-CM

## 2017-09-08 DIAGNOSIS — N1330 Unspecified hydronephrosis: Secondary | ICD-10-CM | POA: Diagnosis not present

## 2017-09-08 LAB — MICROSCOPIC EXAMINATION

## 2017-09-08 LAB — URINALYSIS, COMPLETE
BILIRUBIN UA: NEGATIVE
Nitrite, UA: NEGATIVE
Specific Gravity, UA: 1.025 (ref 1.005–1.030)
UUROB: 2 mg/dL — AB (ref 0.2–1.0)
pH, UA: 6.5 (ref 5.0–7.5)

## 2017-09-08 NOTE — Progress Notes (Signed)
09/08/2017 1:18 PM   Benjamin Villarreal 07/30/1920 [id number]  Referring provider: Venia Carbon, MD [address] [place], [place] [address]  Chief Complaint  Patient presents with  . Follow-up    HPI: 84 year old male with a history of castrate resistant prostate cancer managed by Dr. Rogue Bussing who returns today to discuss management of his chronic left ureteral stent.  He was initially found to have a 1.9 cm left UVJ mass.  He was taken to the operating room on 10/2016 for further evaluation at which time it was noted to be extrinsic to the bladder and ureter.  Ureteral stent was placed.  Follow-up CT scan on 11/2016 showed a decompressed left collecting system with a stent coiled in the proximal ureter.  This presumably was secondary to metastatic castrate resistant prostate cancer.  He is currently on Lupron as well as Xtandi.  He has had PSA response to this medication, most recent PSA 1.58 on 07/2017.  No recent interval imaging.  He returns today to discuss management of his indwelling ureteral stent.  He denies any significant associated urinary symptoms of the stent.  No gross hematuria, urgency, frequency, or any other symptoms.    He is doing well and continues to reside in an assisted living facility.  He is accompanied again today by 1 of his daughters.   PMH: Past Medical History:  Diagnosis Date  . Aortic atherosclerosis ([place])    Noted on CT  . Arthritis   . Colon polyps    adenomatous  . History of fracture of tibia   . History of tachycardia    patient unaware of this  . Kyphosis of cervicothoracic region    SEVERE  . Osteoporosis 01/2014   T -3.8 (01/2014)  . Other constipation    chronic  . Prostate cancer ([place])    surgery then lupron  . Unspecified venous (peripheral) insufficiency   . Vitamin B12 deficiency     Surgical History: Past Surgical History:  Procedure Laterality Date  . BASAL CELL CARCINOMA EXCISION  2004   Left side of  face  . CATARACT EXTRACTION Bilateral 2000, 2003   both eyes  . COLONOSCOPY     h/o adenomatous polyps  . CYSTOSCOPY W/ RETROGRADES Bilateral 11/19/2016   Procedure: CYSTOSCOPY WITH RETROGRADE PYELOGRAM;  Surgeon: Hollice Espy, MD;  Location: [facility];  Service: Urology;  Laterality: Bilateral;  General vs. Spinal  . CYSTOSCOPY W/ URETERAL STENT PLACEMENT Left 02/25/2017   Procedure: CYSTOSCOPY WITH STENT REPLACEMENT;  Surgeon: Hollice Espy, MD;  Location: [facility];  Service: Urology;  Laterality: Left;  . CYSTOSCOPY WITH URETEROSCOPY AND STENT PLACEMENT Left 11/19/2016   Procedure: CYSTOSCOPY WITH URETEROSCOPY AND STENT PLACEMENT;  Surgeon: Hollice Espy, MD;  Location: [facility];  Service: Urology;  Laterality: Left;  . FRACTURE SURGERY    . JOINT REPLACEMENT  2005   Left knee  . JOINT REPLACEMENT  12/13   Right knee  . PROSTATE SURGERY  1996   cancer  . sigmoid resection    . TIBIA FRACTURE SURGERY  1999  . URETERAL BIOPSY Left 11/19/2016   Procedure: URETERAL BIOPSY;  Surgeon: Hollice Espy, MD;  Location: [facility];  Service: Urology;  Laterality: Left;  Marland Kitchen VOLVULUS REDUCTION  12/10    Home Medications:  Allergies as of 09/08/2017      Reactions   Other    RAW ONIONS- SINUS REACTION      Medication List  Accurate as of 09/08/17  1:18 PM. Always use your most recent med list.          acetaminophen 325 MG tablet Commonly known as:  TYLENOL Take 325-650 mg by mouth every 4 (four) hours as needed for fever (general discomfort).   alum & mag hydroxide-simeth 200-200-20 MG/5ML suspension Commonly known as:  MAALOX/MYLANTA Take 30 mLs by mouth every 4 (four) hours as needed for indigestion or heartburn.   aspirin EC 81 MG tablet Take 81 mg by mouth every other day.   Biotin 2.5 MG Caps Take 2.5 mg by mouth daily.   Calcium-D 600-400 MG-UNIT Tabs Take 1 tablet by mouth daily.   carbamide peroxide 6.5 % OTIC solution Commonly known as:  DEBROX 5 drops  daily as needed.   cholecalciferol 1000 units tablet Commonly known as:  VITAMIN D Take 1,000 Units by mouth 2 (two) times daily.   cyclobenzaprine 5 MG tablet Commonly known as:  FLEXERIL Take 5 mg by mouth at bedtime as needed for muscle spasms.   dextromethorphan-guaiFENesin 10-100 MG/5ML liquid Commonly known as:  ROBITUSSIN-DM Take 10 mLs by mouth every 4 (four) hours as needed for cough.   diphenhydrAMINE 25 mg capsule Commonly known as:  BENADRYL Take 25 mg by mouth every 4 (four) hours as needed for itching or allergies.   eucerin cream Apply 1 application topically daily.   fluPHENAZine decanoate 25 MG/ML injection Commonly known as:  PROLIXIN Inject into the muscle.   fluticasone 50 MCG/ACT nasal spray Commonly known as:  FLONASE Place 1 spray into both nostrils daily.   HYDROcodone-acetaminophen 5-325 MG tablet Commonly known as:  NORCO/VICODIN Take 1 tablet by mouth every 6 (six) hours as needed for moderate pain.   ibuprofen 200 MG tablet Commonly known as:  ADVIL,MOTRIN Take 400 mg by mouth 2 (two) times daily as needed for headache or mild pain.   KAOPECTATE 262 MG/15ML suspension Generic drug:  bismuth subsalicylate Take 10 mLs by mouth 3 (three) times daily as needed for diarrhea or loose stools.   latanoprost 0.005 % ophthalmic solution Commonly known as:  XALATAN Place 1 drop into both eyes at bedtime.   loratadine 10 MG tablet Commonly known as:  CLARITIN Take 20 mg by mouth at bedtime.   magnesium hydroxide 400 MG/5ML suspension Commonly known as:  MILK OF MAGNESIA Take 30 mLs by mouth daily as needed for mild constipation.   mupirocin ointment 2 % Commonly known as:  BACTROBAN Place 1 application into the nose 2 (two) times daily.   nystatin powder Generic drug:  nystatin Apply 1 g topically 2 (two) times daily as needed (rash). Use 1 application twice daily as needed for rash   oseltamivir 75 MG capsule Commonly known as:  TAMIFLU    penicillin v potassium 500 MG tablet Commonly known as:  VEETID Take 500 mg by mouth 2 (two) times daily.   polyethylene glycol powder powder Commonly known as:  GLYCOLAX/MIRALAX FILL TO LINE (17 GRAMS ), MIX AND DRINK TWICE A DAY AND 8.5 GRAMS AT NOON   sennosides-docusate sodium 8.6-50 MG tablet Commonly known as:  SENOKOT-S Take 4 tablets by mouth every evening.   sodium chloride 0.65 % Soln nasal spray Commonly known as:  OCEAN Place 1-2 sprays into both nostrils 2 (two) times daily as needed for congestion.   traMADol 50 MG tablet Commonly known as:  ULTRAM Take 1 tablet (50 mg total) by mouth every 6 (six) hours as needed for moderate pain.  triamcinolone cream 0.1 % Commonly known as:  KENALOG Apply 1 application topically daily as needed.   vitamin B-12 1000 MCG tablet Commonly known as:  CYANOCOBALAMIN Take 1,000 mcg by mouth daily.   vitamin C 500 MG tablet Commonly known as:  ASCORBIC ACID Take 500 mg by mouth daily.   XTANDI 40 MG capsule Generic drug:  enzalutamide TAKE 3 CAPSULES (120 MG TOTAL) BY MOUTH DAILY.       Allergies:  Allergies  Allergen Reactions  . Other     RAW ONIONS- SINUS REACTION    Family History: Family History  Problem Relation Age of Onset  . Leukemia Mother   . Diabetes Son   . Prostate cancer Neg Hx   . Bladder Cancer Neg Hx   . Kidney cancer Neg Hx     Social History:  reports that he has never smoked. He has never used smokeless tobacco. He reports that he does not drink alcohol or use drugs.  ROS: UROLOGY Frequent Urination?: No Hard to postpone urination?: No Burning/pain with urination?: No Get up at night to urinate?: No Leakage of urine?: No Urine stream starts and stops?: No Trouble starting stream?: No Do you have to strain to urinate?: No Blood in urine?: No Urinary tract infection?: No Sexually transmitted disease?: No Injury to kidneys or bladder?: No Painful intercourse?: No Weak stream?:  No Erection problems?: No Penile pain?: No  Gastrointestinal Nausea?: No Vomiting?: No Indigestion/heartburn?: No Diarrhea?: No Constipation?: No  Constitutional Fever: No Night sweats?: No Weight loss?: No Fatigue?: No  Skin Skin rash/lesions?: Yes Itching?: No  Eyes Blurred vision?: No Double vision?: No  Ears/Nose/Throat Sore throat?: No Sinus problems?: No  Hematologic/Lymphatic Swollen glands?: No Easy bruising?: No  Cardiovascular Leg swelling?: Yes Chest pain?: No  Respiratory Cough?: No Shortness of breath?: No  Endocrine Excessive thirst?: No  Musculoskeletal Back pain?: No Joint pain?: No  Neurological Headaches?: No Dizziness?: No  Psychologic Depression?: No Anxiety?: No  Physical Exam: BP 124/65   Pulse 68   Resp 16   Ht 6\' 2"  (1.88 m)   Wt 182 lb (82.6 kg)   SpO2 96%   BMI 23.37 kg/m   Constitutional:  Alert and oriented, No acute distress.  Elderly.  Wearing life alert necklace.  Ambulating with walker.  Accompanied by daughter. HEENT: [place] AT, moist mucus membranes.  Trachea midline, no masses.  Wearing hearing aids, somewhat hard of hearing. Cardiovascular: No clubbing, cyanosis, or edema. Respiratory: Normal respiratory effort, no increased work of breathing. Skin: No rashes, bruises or suspicious lesions. Neurologic: Grossly intact, no focal deficits, moving all 4 extremities. Psychiatric: Normal mood and affect.  Laboratory Data: Lab Results  Component Value Date   WBC 5.6 08/05/2017   HGB 12.2 (L) 08/05/2017   HCT 35.5 (L) 08/05/2017   MCV 90.7 08/05/2017   PLT 149 (L) 08/05/2017    Lab Results  Component Value Date   CREATININE 0.91 08/05/2017    Component     Latest Ref Rng & Units 10/03/2016 11/03/2016 12/01/2016 12/29/2016  Prostatic Specific Antigen     0.00 - 4.00 ng/mL 5.37 (H) 8.02 (H) 8.99 (H) 2.53   Component     Latest Ref Rng & Units 02/09/2017 04/01/2017 05/13/2017 06/24/2017  Prostatic Specific  Antigen     0.00 - 4.00 ng/mL 3.68 3.72 2.84 2.56   Component     Latest Ref Rng & Units 08/05/2017  Prostatic Specific Antigen     0.00 - 4.00  ng/mL 1.58  Okay  Lab Results  Component Value Date   TESTOSTERONE <3 (L) 10/03/2016    Urinalysis Preop urine obtained today  Pertinent Imaging: No recent new interval imaging  Assessment & Plan:    1. Prostate cancer ([place]) Castrate resistant prostate cancer managed by the cancer center on Xtandi and Lupron PSA responding appropriately but not an optimal biomarker given that it was never significantly elevated - CT Abdomen Pelvis W Contrast; Future  2. Hydronephrosis of left kidney Managed with chronic indwelling stent Due for stent exchange Prior to proceeding to the operating room with need for anesthesia, would recommend proceeding with CT abdomen pelvis with contrast to assess his overall disease burden and response to medication as he may not need this done at this point if he has had a good response Patient and his daughter are agreeable with this plan  Will call one of the patient's daughters once he had the CT scan results to discuss whether or not we should pursue stent exchange  3. Pre-op testing Given the patient will likely be going for ureteral stent exchange, UA/urine culture obtained today for the purpose of preop - Urinalysis, Complete - CULTURE, URINE COMPREHENSIVE   Return for will call with CT scan resutls and plan.  Hollice Espy, MD  [facility] [address] [place], [place] [address] [phone]

## 2017-09-10 MED FILL — XTANDI 40 MG CAPSULE: 40 | 30 days supply | Qty: 90 | Fill #4

## 2017-09-11 ENCOUNTER — Other Ambulatory Visit: Payer: Self-pay | Admitting: *Deleted

## 2017-09-11 DIAGNOSIS — C61 Malignant neoplasm of prostate: Secondary | ICD-10-CM

## 2017-09-11 LAB — CULTURE, URINE COMPREHENSIVE

## 2017-09-14 DIAGNOSIS — L578 Other skin changes due to chronic exposure to nonionizing radiation: Secondary | ICD-10-CM | POA: Diagnosis not present

## 2017-09-14 DIAGNOSIS — L821 Other seborrheic keratosis: Secondary | ICD-10-CM | POA: Diagnosis not present

## 2017-09-14 DIAGNOSIS — L0390 Cellulitis, unspecified: Secondary | ICD-10-CM | POA: Diagnosis not present

## 2017-09-14 DIAGNOSIS — L820 Inflamed seborrheic keratosis: Secondary | ICD-10-CM | POA: Diagnosis not present

## 2017-09-16 ENCOUNTER — Inpatient Hospital Stay: Payer: Medicare Other | Attending: Internal Medicine

## 2017-09-16 ENCOUNTER — Inpatient Hospital Stay: Payer: Medicare Other

## 2017-09-16 ENCOUNTER — Inpatient Hospital Stay (HOSPITAL_BASED_OUTPATIENT_CLINIC_OR_DEPARTMENT_OTHER): Payer: Medicare Other | Admitting: Internal Medicine

## 2017-09-16 VITALS — BP 115/78 | HR 98 | Temp 97.1°F | Resp 16

## 2017-09-16 DIAGNOSIS — Z79899 Other long term (current) drug therapy: Secondary | ICD-10-CM

## 2017-09-16 DIAGNOSIS — Z7982 Long term (current) use of aspirin: Secondary | ICD-10-CM

## 2017-09-16 DIAGNOSIS — Z192 Hormone resistant malignancy status: Secondary | ICD-10-CM | POA: Insufficient documentation

## 2017-09-16 DIAGNOSIS — Z85828 Personal history of other malignant neoplasm of skin: Secondary | ICD-10-CM | POA: Diagnosis not present

## 2017-09-16 DIAGNOSIS — M810 Age-related osteoporosis without current pathological fracture: Secondary | ICD-10-CM | POA: Insufficient documentation

## 2017-09-16 DIAGNOSIS — I700 Atherosclerosis of aorta: Secondary | ICD-10-CM | POA: Diagnosis not present

## 2017-09-16 DIAGNOSIS — C61 Malignant neoplasm of prostate: Secondary | ICD-10-CM | POA: Diagnosis not present

## 2017-09-16 DIAGNOSIS — M7989 Other specified soft tissue disorders: Secondary | ICD-10-CM | POA: Insufficient documentation

## 2017-09-16 DIAGNOSIS — Z86010 Personal history of colonic polyps: Secondary | ICD-10-CM | POA: Diagnosis not present

## 2017-09-16 DIAGNOSIS — N1330 Unspecified hydronephrosis: Secondary | ICD-10-CM | POA: Insufficient documentation

## 2017-09-16 DIAGNOSIS — E538 Deficiency of other specified B group vitamins: Secondary | ICD-10-CM | POA: Diagnosis not present

## 2017-09-16 DIAGNOSIS — D509 Iron deficiency anemia, unspecified: Secondary | ICD-10-CM | POA: Diagnosis not present

## 2017-09-16 DIAGNOSIS — K5900 Constipation, unspecified: Secondary | ICD-10-CM | POA: Diagnosis not present

## 2017-09-16 DIAGNOSIS — Z79818 Long term (current) use of other agents affecting estrogen receptors and estrogen levels: Secondary | ICD-10-CM

## 2017-09-16 LAB — CBC WITH DIFFERENTIAL/PLATELET
BASOS ABS: 0 10*3/uL (ref 0–0.1)
BASOS PCT: 0 %
EOS ABS: 0.3 10*3/uL (ref 0–0.7)
Eosinophils Relative: 4 %
HEMATOCRIT: 37.2 % — AB (ref 40.0–52.0)
HEMOGLOBIN: 12.7 g/dL — AB (ref 13.0–18.0)
Lymphocytes Relative: 29 %
Lymphs Abs: 1.9 10*3/uL (ref 1.0–3.6)
MCH: 31.1 pg (ref 26.0–34.0)
MCHC: 34.2 g/dL (ref 32.0–36.0)
MCV: 90.9 fL (ref 80.0–100.0)
Monocytes Absolute: 0.6 10*3/uL (ref 0.2–1.0)
Monocytes Relative: 8 %
NEUTROS ABS: 4 10*3/uL (ref 1.4–6.5)
NEUTROS PCT: 59 %
Platelets: 163 10*3/uL (ref 150–440)
RBC: 4.09 MIL/uL — ABNORMAL LOW (ref 4.40–5.90)
RDW: 14.2 % (ref 11.5–14.5)
WBC: 6.8 10*3/uL (ref 3.8–10.6)

## 2017-09-16 LAB — COMPREHENSIVE METABOLIC PANEL
ALK PHOS: 52 U/L (ref 38–126)
ALT: 12 U/L (ref 0–44)
ANION GAP: 10 (ref 5–15)
AST: 24 U/L (ref 15–41)
Albumin: 3.9 g/dL (ref 3.5–5.0)
BILIRUBIN TOTAL: 0.7 mg/dL (ref 0.3–1.2)
BUN: 19 mg/dL (ref 8–23)
CALCIUM: 9 mg/dL (ref 8.9–10.3)
CO2: 19 mmol/L — ABNORMAL LOW (ref 22–32)
CREATININE: 0.87 mg/dL (ref 0.61–1.24)
Chloride: 106 mmol/L (ref 98–111)
Glucose, Bld: 164 mg/dL — ABNORMAL HIGH (ref 70–99)
Potassium: 4.2 mmol/L (ref 3.5–5.1)
Sodium: 135 mmol/L (ref 135–145)
TOTAL PROTEIN: 7.3 g/dL (ref 6.5–8.1)

## 2017-09-16 LAB — PSA: Prostatic Specific Antigen: 1.35 ng/mL (ref 0.00–4.00)

## 2017-09-16 MED ORDER — CEPHALEXIN 500 MG PO CAPS: 500.0000 mg | ORAL_CAPSULE | Freq: Three times a day (TID) | ORAL | 0 refills | Status: DC

## 2017-09-16 MED ORDER — DENOSUMAB 60 MG/ML ~~LOC~~ SOSY
60.0000 mg | PREFILLED_SYRINGE | Freq: Once | SUBCUTANEOUS | Status: AC
  Administered 2017-09-16: 60 mg via SUBCUTANEOUS
  Filled 2017-09-16: qty 1

## 2017-09-16 MED ORDER — LEUPROLIDE ACETATE (3 MONTH) 22.5 MG IM KIT
22.5000 mg | PACK | Freq: Once | INTRAMUSCULAR | Status: AC
  Administered 2017-09-16: 22.5 mg via INTRAMUSCULAR
  Filled 2017-09-16: qty 22.5

## 2017-09-16 NOTE — Assessment & Plan Note (Addendum)
# #  CASTRATE RESISTANT PROSTATE CANCER- Metastatic disease with left bladder invasion/obstruction.  Currently on Xtandi.  Clinically stable  #Patient had overall good response with regards to prostate cancer; however given the right leg swelling; I think is reasonable to hold Xtandi at this time.  See discussion below.  Okay to proceed with Lupron today.  # Severe osteoporosis: Currently on Prolia; plan today.  Stable  #Left ureteral stent placement/hydronephrosis: Status post exchange.  Stable; awaiting CT scan with urology next week  # Iron deficiency anemia: Improved stable continue p.o. iron.  #Right lower extremity swelling erythema-concern for cellulitis; recommend starting the patient on Keflex for 1 week; and then go back on penicillin suppression.  Also reviewed the note from [facility]-dermatology.  Clinically not suggestive of DVT.  # HOLD of compression stocking; and put them back on when righ leg swelling/redness is improved   Follow-up in 1 month# ; labs.  We will plan to restart Xtandi at the time.

## 2017-09-16 NOTE — Patient Instructions (Signed)
#   HOLD off X-Tandi x 1 month  # HOLD Penicillin x1 week; and re-start after new anti-biotic [Keflex] is completed  # Start Keflex 500 three times a day x 1 week  # HOLD of compression stocking; and put them back on when righ leg swelling/redness is improved

## 2017-09-16 NOTE — Progress Notes (Signed)
Patient is here today with daughter. Patient and daughter state there have been concerns with memory issues, fatigue, peripheral edema, & confusion.

## 2017-09-16 NOTE — Progress Notes (Signed)
[place] OFFICE PROGRESS NOTE  Patient Care Team: Venia Carbon, MD as PCP - General  Cancer Staging No matching staging information was found for the patient.   Oncology History    # 1996 PROSTATE CANCER [Gleason score 2+3] s/p Radical Prostatectomy [[facility]]; ?Biochem RECURRENT PROSTATE CANCER-Hormone sensitive;  Intermittent Lupron; on Lupron q 4M [Jan 2016 PSA- 7.9].  HOLD LUPRON in MAY 2017; AUg 2017- Re-start  # OCT 2018- Xtandi 3 pills a day; OCT 30th PET- Retroperitoneal/pelvic adenopathy/recurrence lateral bladder wall; NO Bone lesions.   # Osteoporosis [BMD- 2015]- on Reclast q 63M; FEB 2017- Start Prolia q 6 M -------------------------------------------------------------   DIAGNOSIS: Recurrent metastatic prostate cancer castrate resistant  STAGE:   4      ;GOALS: Palliative  CURRENT/MOST RECENT THERAPY : Xtandi      Prostate cancer [facility])      INTERVAL HISTORY:  Benjamin Villarreal 84 y.o.  male pleasant patient above history of castrate resistant prostate cancer currently on Xtandi is here for follow-up.  Patient denies any bone pain.  Denies any nausea vomiting.  Patient does complain of worsening right lower extremity swelling/discomfort/erythema.  Patient continues to wear compression stockings.  Review of Systems  Constitutional: Negative for chills, diaphoresis, fever, malaise/fatigue and weight loss.  HENT: Negative for nosebleeds and sore throat.   Eyes: Negative for double vision.  Respiratory: Negative for cough, hemoptysis, sputum production, shortness of breath and wheezing.   Cardiovascular: Positive for leg swelling (Right leg). Negative for chest pain, palpitations and orthopnea.  Gastrointestinal: Negative for abdominal pain, blood in stool, constipation, diarrhea, heartburn, melena, nausea and vomiting.  Genitourinary: Negative for dysuria, frequency and urgency.  Musculoskeletal: Negative for back pain and joint pain.  Skin:  Positive for rash (Right leg). Negative for itching.  Neurological: Negative for dizziness, tingling, focal weakness, weakness and headaches.  Endo/Heme/Allergies: Does not bruise/bleed easily.  Psychiatric/Behavioral: Negative for depression. The patient is not nervous/anxious and does not have insomnia.       PAST MEDICAL HISTORY :  Past Medical History:  Diagnosis Date  . Aortic atherosclerosis ([place])    Noted on CT  . Arthritis   . Colon polyps    adenomatous  . History of fracture of tibia   . History of tachycardia    patient unaware of this  . Kyphosis of cervicothoracic region    SEVERE  . Osteoporosis 01/2014   T -3.8 (01/2014)  . Other constipation    chronic  . Prostate cancer ([place])    surgery then lupron  . Unspecified venous (peripheral) insufficiency   . Vitamin B12 deficiency     PAST SURGICAL HISTORY :   Past Surgical History:  Procedure Laterality Date  . BASAL CELL CARCINOMA EXCISION  2004   Left side of face  . CATARACT EXTRACTION Bilateral 2000, 2003   both eyes  . COLONOSCOPY     h/o adenomatous polyps  . CYSTOSCOPY W/ RETROGRADES Bilateral 11/19/2016   Procedure: CYSTOSCOPY WITH RETROGRADE PYELOGRAM;  Surgeon: Hollice Espy, MD;  Location: [facility] ORS;  Service: Urology;  Laterality: Bilateral;  General vs. Spinal  . CYSTOSCOPY W/ URETERAL STENT PLACEMENT Left 02/25/2017   Procedure: CYSTOSCOPY WITH STENT REPLACEMENT;  Surgeon: Hollice Espy, MD;  Location: [facility] ORS;  Service: Urology;  Laterality: Left;  . CYSTOSCOPY WITH URETEROSCOPY AND STENT PLACEMENT Left 11/19/2016   Procedure: CYSTOSCOPY WITH URETEROSCOPY AND STENT PLACEMENT;  Surgeon: Hollice Espy, MD;  Location: [facility] ORS;  Service: Urology;  Laterality: Left;  .  FRACTURE SURGERY    . JOINT REPLACEMENT  2005   Left knee  . JOINT REPLACEMENT  12/13   Right knee  . PROSTATE SURGERY  1996   cancer  . sigmoid resection    . TIBIA FRACTURE SURGERY  1999  . URETERAL BIOPSY Left 11/19/2016    Procedure: URETERAL BIOPSY;  Surgeon: Hollice Espy, MD;  Location: [facility] ORS;  Service: Urology;  Laterality: Left;  Marland Kitchen VOLVULUS REDUCTION  12/10    FAMILY HISTORY :   Family History  Problem Relation Age of Onset  . Leukemia Mother   . Diabetes Son   . Prostate cancer Neg Hx   . Bladder Cancer Neg Hx   . Kidney cancer Neg Hx     SOCIAL HISTORY:   Social History   Tobacco Use  . Smoking status: Never Smoker  . Smokeless tobacco: Never Used  Substance Use Topics  . Alcohol use: No    Alcohol/week: 0.0 - 0.6 oz    Frequency: Never    Comment: in past  . Drug use: No    ALLERGIES:  is allergic to other.  MEDICATIONS:  Current Outpatient Medications  Medication Sig Dispense Refill  . acetaminophen (TYLENOL) 325 MG tablet Take 325-650 mg by mouth every 4 (four) hours as needed for fever (general discomfort).     Marland Kitchen alum & mag hydroxide-simeth (MAALOX/MYLANTA) 200-200-20 MG/5ML suspension Take 30 mLs by mouth every 4 (four) hours as needed for indigestion or heartburn.    Marland Kitchen aspirin EC 81 MG tablet Take 81 mg by mouth every other day.    . Biotin 2.5 MG CAPS Take 2.5 mg by mouth daily.     Marland Kitchen bismuth subsalicylate (KAOPECTATE) 262 MG/15ML suspension Take 10 mLs by mouth 3 (three) times daily as needed for diarrhea or loose stools.     . Calcium Carbonate-Vitamin D (CALCIUM-D) 600-400 MG-UNIT TABS Take 1 tablet by mouth daily.     . carbamide peroxide (DEBROX) 6.5 % OTIC solution 5 drops daily as needed.    . cephALEXin (KEFLEX) 500 MG capsule Take 1 capsule (500 mg total) by mouth 3 (three) times daily. 21 capsule 0  . cholecalciferol (VITAMIN D) 1000 UNITS tablet Take 1,000 Units by mouth 2 (two) times daily.     . cyclobenzaprine (FLEXERIL) 5 MG tablet Take 5 mg by mouth at bedtime as needed for muscle spasms.    Marland Kitchen dextromethorphan-guaiFENesin (ROBITUSSIN-DM) 10-100 MG/5ML liquid Take 10 mLs by mouth every 4 (four) hours as needed for cough.    . diphenhydrAMINE (BENADRYL) 25 mg  capsule Take 25 mg by mouth every 4 (four) hours as needed for itching or allergies.    . fluPHENAZine decanoate (PROLIXIN) 25 MG/ML injection Inject into the muscle.    . fluticasone (FLONASE) 50 MCG/ACT nasal spray Place 1 spray into both nostrils daily.    Marland Kitchen HYDROcodone-acetaminophen (NORCO/VICODIN) 5-325 MG tablet Take 1 tablet by mouth every 6 (six) hours as needed for moderate pain. 30 tablet 0  . ibuprofen (ADVIL,MOTRIN) 200 MG tablet Take 400 mg by mouth 2 (two) times daily as needed for headache or mild pain.     Marland Kitchen latanoprost (XALATAN) 0.005 % ophthalmic solution Place 1 drop into both eyes at bedtime.    Marland Kitchen loratadine (CLARITIN) 10 MG tablet Take 20 mg by mouth at bedtime.    . magnesium hydroxide (MILK OF MAGNESIA) 400 MG/5ML suspension Take 30 mLs by mouth daily as needed for mild constipation.    . mupirocin  ointment (BACTROBAN) 2 % Place 1 application into the nose 2 (two) times daily. 22 g 0  . nystatin (NYSTATIN) powder Apply 1 g topically 2 (two) times daily as needed (rash). Use 1 application twice daily as needed for rash    . oseltamivir (TAMIFLU) 75 MG capsule     . penicillin v potassium (VEETID) 500 MG tablet Take 500 mg by mouth 2 (two) times daily.    . polyethylene glycol powder (GLYCOLAX/MIRALAX) powder FILL TO LINE (17 GRAMS ), MIX AND DRINK TWICE A DAY AND 8.5 GRAMS AT NOON 510 g 3  . sennosides-docusate sodium (SENOKOT-S) 8.6-50 MG tablet Take 4 tablets by mouth every evening.     . Skin Protectants, Misc. (EUCERIN) cream Apply 1 application topically daily.    . sodium chloride (OCEAN) 0.65 % SOLN nasal spray Place 1-2 sprays into both nostrils 2 (two) times daily as needed for congestion.    . traMADol (ULTRAM) 50 MG tablet Take 1 tablet (50 mg total) by mouth every 6 (six) hours as needed for moderate pain. 30 tablet 0  . triamcinolone cream (KENALOG) 0.1 % Apply 1 application topically daily as needed.     . vitamin B-12 (CYANOCOBALAMIN) 1000 MCG tablet Take 1,000  mcg by mouth daily.    . vitamin C (ASCORBIC ACID) 500 MG tablet Take 500 mg by mouth daily.    [name] 40 MG capsule TAKE 3 CAPSULES (120 MG TOTAL) BY MOUTH DAILY. 90 capsule 4   No current facility-administered medications for this visit.    Facility-Administered Medications Ordered in Other Visits  Medication Dose Route Frequency Provider Last Rate Last Dose  . leuprolide (LUPRON) injection 30 mg  30 mg Intramuscular Once Leia Alf, MD        PHYSICAL EXAMINATION: ECOG PERFORMANCE STATUS: 1 - Symptomatic but completely ambulatory  BP 115/78   Pulse 98   Temp (!) 97.1 F (36.2 C) (Tympanic)   Resp 16   There were no vitals filed for this visit.  GENERAL: Well-nourished well-developed; Alert, no distress and comfortable.  Accompanied by his daughter.  He walks with a rolling walker.Marland Kitchen  EYES: no pallor or icterus OROPHARYNX: no thrush or ulceration; NECK: supple; no lymph nodes felt. LYMPH:  no palpable lymphadenopathy in the axillary or inguinal regions LUNGS: Decreased breath sounds auscultation bilaterally. No wheeze or crackles HEART/CVS: regular rate & rhythm and no murmurs; No lower extremity edema ABDOMEN:abdomen soft, non-tender and normal bowel sounds. No hepatomegaly or splenomegaly.  Musculoskeletal:no cyanosis of digits and no clubbing  PSYCH: alert & oriented x 3 with fluent speech NEURO: no focal motor/sensory deficits SKIN: Right extremity swollen significantly compared to the left.  No tenderness.  Erythema noted.   LABORATORY DATA:  I have reviewed the data as listed    Component Value Date/Time   NA 135 09/16/2017 0949   NA 140 02/11/2012 0415   K 4.2 09/16/2017 0949   K 3.9 02/11/2012 0415   CL 106 09/16/2017 0949   CL 108 (H) 02/11/2012 0415   CO2 19 (L) 09/16/2017 0949   CO2 25 02/11/2012 0415   GLUCOSE 164 (H) 09/16/2017 0949   GLUCOSE 125 (H) 02/11/2012 0415   BUN 19 09/16/2017 0949   BUN 12 02/11/2012 0415   CREATININE 0.87 09/16/2017  0949   CREATININE 1.00 03/23/2014 1400   CALCIUM 9.0 09/16/2017 0949   CALCIUM 8.3 (L) 03/23/2014 1400   PROT 7.3 09/16/2017 0949   PROT 7.0 03/07/2014 1414  ALBUMIN 3.9 09/16/2017 0949   ALBUMIN 3.7 03/07/2014 1414   AST 24 09/16/2017 0949   AST 16 03/07/2014 1414   ALT 12 09/16/2017 0949   ALT 19 03/07/2014 1414   ALKPHOS 52 09/16/2017 0949   ALKPHOS 69 03/07/2014 1414   BILITOT 0.7 09/16/2017 0949   BILITOT 0.3 03/07/2014 1414   GFRNONAA >60 09/16/2017 0949   GFRNONAA >60 03/23/2014 1400   GFRNONAA 49 (L) 09/02/2013 1428   GFRAA >60 09/16/2017 0949   GFRAA >60 03/23/2014 1400   GFRAA 57 (L) 09/02/2013 1428    No results found for: SPEP, UPEP  Lab Results  Component Value Date   WBC 6.8 09/16/2017   NEUTROABS 4.0 09/16/2017   HGB 12.7 (L) 09/16/2017   HCT 37.2 (L) 09/16/2017   MCV 90.9 09/16/2017   PLT 163 09/16/2017      Chemistry      Component Value Date/Time   NA 135 09/16/2017 0949   NA 140 02/11/2012 0415   K 4.2 09/16/2017 0949   K 3.9 02/11/2012 0415   CL 106 09/16/2017 0949   CL 108 (H) 02/11/2012 0415   CO2 19 (L) 09/16/2017 0949   CO2 25 02/11/2012 0415   BUN 19 09/16/2017 0949   BUN 12 02/11/2012 0415   CREATININE 0.87 09/16/2017 0949   CREATININE 1.00 03/23/2014 1400   GLU 114 06/30/2016      Component Value Date/Time   CALCIUM 9.0 09/16/2017 0949   CALCIUM 8.3 (L) 03/23/2014 1400   ALKPHOS 52 09/16/2017 0949   ALKPHOS 69 03/07/2014 1414   AST 24 09/16/2017 0949   AST 16 03/07/2014 1414   ALT 12 09/16/2017 0949   ALT 19 03/07/2014 1414   BILITOT 0.7 09/16/2017 0949   BILITOT 0.3 03/07/2014 1414       RADIOGRAPHIC STUDIES: I have personally reviewed the radiological images as listed and agreed with the findings in the report. No results found.   ASSESSMENT & PLAN:  Prostate cancer ([place]) # #CASTRATE RESISTANT PROSTATE CANCER- Metastatic disease with left bladder invasion/obstruction.  Currently on Xtandi.  Clinically  stable  #Patient had overall good response with regards to prostate cancer; however given the right leg swelling; I think is reasonable to hold Xtandi at this time.  See discussion below.  Okay to proceed with Lupron today.  # Severe osteoporosis: Currently on Prolia; plan today.  Stable  #Left ureteral stent placement/hydronephrosis: Status post exchange.  Stable; awaiting CT scan with urology next week  # Iron deficiency anemia: Improved stable continue p.o. iron.  #Right lower extremity swelling erythema-concern for cellulitis; recommend starting the patient on Keflex for 1 week; and then go back on penicillin suppression.  Also reviewed the note from [facility]-dermatology.  Clinically not suggestive of DVT.  # HOLD of compression stocking; and put them back on when righ leg swelling/redness is improved   Follow-up in 1 month# ; labs.  We will plan to restart Xtandi at the time.   No orders of the defined types were placed in this encounter.  All questions were answered. The patient knows to call the clinic with any problems, questions or concerns.      Cammie Sickle, MD 09/19/2017 8:30 AM

## 2017-09-18 DIAGNOSIS — M2580 Other specified joint disorders, unspecified joint: Secondary | ICD-10-CM | POA: Diagnosis not present

## 2017-09-18 DIAGNOSIS — M2041 Other hammer toe(s) (acquired), right foot: Secondary | ICD-10-CM | POA: Diagnosis not present

## 2017-09-18 DIAGNOSIS — R600 Localized edema: Secondary | ICD-10-CM | POA: Diagnosis not present

## 2017-09-18 DIAGNOSIS — M2042 Other hammer toe(s) (acquired), left foot: Secondary | ICD-10-CM | POA: Diagnosis not present

## 2017-09-18 DIAGNOSIS — B351 Tinea unguium: Secondary | ICD-10-CM | POA: Diagnosis not present

## 2017-09-21 ENCOUNTER — Ambulatory Visit
Admission: RE | Admit: 2017-09-21 | Discharge: 2017-09-21 | Disposition: A | Discharge status: Home / Self Care | Payer: Medicare Other | Source: Ambulatory Visit | Attending: Urology | Admitting: Urology

## 2017-09-21 DIAGNOSIS — I700 Atherosclerosis of aorta: Secondary | ICD-10-CM | POA: Diagnosis not present

## 2017-09-21 DIAGNOSIS — C61 Malignant neoplasm of prostate: Secondary | ICD-10-CM | POA: Insufficient documentation

## 2017-09-21 HISTORY — DX: Basal cell carcinoma of skin, unspecified: C44.91

## 2017-09-21 MED ORDER — IOPAMIDOL (ISOVUE-300) INJECTION 61%
100.0000 mL | Freq: Once | INTRAVENOUS | Status: AC | PRN
  Administered 2017-09-21: 100 mL via INTRAVENOUS

## 2017-09-24 ENCOUNTER — Telehealth: Payer: Self-pay | Admitting: Family Medicine

## 2017-09-24 ENCOUNTER — Encounter: Payer: Self-pay | Admitting: *Deleted

## 2017-09-24 ENCOUNTER — Emergency Department: Payer: Medicare Other

## 2017-09-24 ENCOUNTER — Emergency Department
Admission: EM | Admit: 2017-09-24 | Discharge: 2017-09-24 | Disposition: A | Discharge status: Home / Self Care | Payer: Medicare Other | Attending: Emergency Medicine | Admitting: Emergency Medicine

## 2017-09-24 ENCOUNTER — Other Ambulatory Visit: Payer: Self-pay

## 2017-09-24 DIAGNOSIS — Z79899 Other long term (current) drug therapy: Secondary | ICD-10-CM | POA: Insufficient documentation

## 2017-09-24 DIAGNOSIS — L03115 Cellulitis of right lower limb: Secondary | ICD-10-CM | POA: Insufficient documentation

## 2017-09-24 DIAGNOSIS — Z7982 Long term (current) use of aspirin: Secondary | ICD-10-CM | POA: Diagnosis not present

## 2017-09-24 DIAGNOSIS — L0390 Cellulitis, unspecified: Secondary | ICD-10-CM

## 2017-09-24 DIAGNOSIS — Z96653 Presence of artificial knee joint, bilateral: Secondary | ICD-10-CM | POA: Insufficient documentation

## 2017-09-24 DIAGNOSIS — M25571 Pain in right ankle and joints of right foot: Secondary | ICD-10-CM | POA: Diagnosis not present

## 2017-09-24 DIAGNOSIS — R600 Localized edema: Secondary | ICD-10-CM | POA: Diagnosis not present

## 2017-09-24 DIAGNOSIS — R609 Edema, unspecified: Secondary | ICD-10-CM

## 2017-09-24 DIAGNOSIS — M79671 Pain in right foot: Secondary | ICD-10-CM | POA: Diagnosis not present

## 2017-09-24 DIAGNOSIS — M7989 Other specified soft tissue disorders: Secondary | ICD-10-CM | POA: Diagnosis not present

## 2017-09-24 LAB — BASIC METABOLIC PANEL
Anion gap: 8 (ref 5–15)
BUN: 21 mg/dL (ref 8–23)
CO2: 23 mmol/L (ref 22–32)
CREATININE: 0.7 mg/dL (ref 0.61–1.24)
Calcium: 8.9 mg/dL (ref 8.9–10.3)
Chloride: 106 mmol/L (ref 98–111)
GFR calc Af Amer: >60 mL/min (ref >60)
Glucose, Bld: 136 mg/dL — ABNORMAL HIGH (ref 70–99)
Potassium: 4.3 mmol/L (ref 3.5–5.1)
SODIUM: 137 mmol/L (ref 135–145)

## 2017-09-24 LAB — CBC WITH DIFFERENTIAL/PLATELET
BASOS ABS: 0.1 10*3/uL (ref 0–0.1)
Basophils Relative: 2 %
EOS ABS: 0.3 10*3/uL (ref 0–0.7)
EOS PCT: 5 %
HCT: 36.3 % — ABNORMAL LOW (ref 40.0–52.0)
Hemoglobin: 12.8 g/dL — ABNORMAL LOW (ref 13.0–18.0)
LYMPHS ABS: 2.2 10*3/uL (ref 1.0–3.6)
Lymphocytes Relative: 31 %
MCH: 32.1 pg (ref 26.0–34.0)
MCHC: 35.2 g/dL (ref 32.0–36.0)
MCV: 91.4 fL (ref 80.0–100.0)
Monocytes Absolute: 0.7 10*3/uL (ref 0.2–1.0)
Monocytes Relative: 10 %
Neutro Abs: 3.7 10*3/uL (ref 1.4–6.5)
Neutrophils Relative %: 52 %
PLATELETS: 145 10*3/uL — AB (ref 150–440)
RBC: 3.97 MIL/uL — AB (ref 4.40–5.90)
RDW: 14.1 % (ref 11.5–14.5)
WBC: 7.1 10*3/uL (ref 3.8–10.6)

## 2017-09-24 LAB — BRAIN NATRIURETIC PEPTIDE: B NATRIURETIC PEPTIDE 5: 81 pg/mL (ref 0.0–100.0)

## 2017-09-24 MED ORDER — SULFAMETHOXAZOLE-TRIMETHOPRIM 800-160 MG PO TABS: 1.0000 | ORAL_TABLET | Freq: Two times a day (BID) | ORAL | 0 refills | Status: AC

## 2017-09-24 MED ORDER — SULFAMETHOXAZOLE-TRIMETHOPRIM 800-160 MG PO TABS
1.0000 | ORAL_TABLET | Freq: Once | ORAL | Status: AC
  Administered 2017-09-24: 1 via ORAL
  Filled 2017-09-24: qty 1

## 2017-09-24 NOTE — ED Notes (Signed)
Pt reports that his right foot and leg are swollen and red. Pt is alert and oriented x 4. Family at bedside

## 2017-09-24 NOTE — Telephone Encounter (Signed)
-----   Message from Hollice Espy, MD sent at 09/24/2017  8:35 AM EDT ----- Please let this patient as well as at least 1 of his daughters know that his CT scan looks excellent.  The pelvic lymph nodes are absent as well as the bladder mass.  I would like to try to take his stent out in the office and see how it goes rather than going to the operating room and exchanging the stent as discussed   Please schedule cystoscopy, stent removal in the office in the near future.Hollice Espy, MD

## 2017-09-24 NOTE — Telephone Encounter (Signed)
Patient's daughter notified.

## 2017-09-24 NOTE — Discharge Instructions (Signed)
Follow closely with your primary care doctor or the referral physician from today for repeat visit on Monday to make sure that you look at the leg.  If there is increased pain, swelling, redness, fever, streaks from the area or other concerns, return to the emergency department.  Take the antibiotics twice a day.  Stop taking the Keflex.  We will give the first antibiotic dose of Bactrim today.  I encourage probiotic use.

## 2017-09-24 NOTE — Telephone Encounter (Signed)
Already done  And they have been notified   Benjamin Villarreal

## 2017-09-24 NOTE — ED Provider Notes (Signed)
[facility] Emergency Department Provider Note  ____________________________________________   I have reviewed the triage vital signs and the nursing notes. Where available I have reviewed prior notes and, if possible and indicated, outside hospital notes.    HISTORY  Chief Complaint Leg Swelling    HPI Benjamin Villarreal is a 84 y.o. male with a history in the past of left-sided cellulitis presents with several weeks of right-sided this and swelling to his right lower extremity around the calf to the foot.  He has an asymmetric swelling there.  He has been on Keflex but it does not seem to be getting better.  He has had no fever no chills no chest pain or shortness of breath he does walk on the foot every morning with no difficulty, he has had no change in his constitutional self meaning no systemic illness.  He otherwise feels fine.  However, he has been on Keflex for this redness and it is not getting better he went to a outside facility where he was sent here for Doppler ultrasound to rule out DVT.  Patient has no known history of DVT, he is not having chest pain or shortness of breath, he does have a history of prostate cancer.  He is not complaining of significant pain he has no knee pain or hip pain that he does not have any recollected trauma he states his second and third toe feel little bit uncomfortable sometimes when he walks with a not red or otherwise swollen, and according to family this is been getting gradually worse for a month.     Past Medical History:  Diagnosis Date  . Aortic atherosclerosis ([place])    Noted on CT  . Arthritis   . Colon polyps    adenomatous  . History of fracture of tibia   . History of tachycardia    patient unaware of this  . Kyphosis of cervicothoracic region    SEVERE  . Osteoporosis 01/2014   T -3.8 (01/2014)  . Other constipation    chronic  . Prostate cancer [facility]) 1996   surgery then lupron  . Skin cancer, basal cell  2004   surgical resection.  Marland Kitchen Unspecified venous (peripheral) insufficiency   . Vitamin B12 deficiency     Patient Active Problem List   Diagnosis Date Noted  . Primary prostate cancer with metastasis from prostate to other site [facility]) 07/23/2017  . Stasis dermatitis 07/23/2017  . Lymphedema 06/16/2017  . Cellulitis 05/05/2017  . Aortic atherosclerosis ([place]) 10/09/2016  . Iron deficiency anemia due to chronic blood loss 10/05/2016  . Peripheral neuropathy 01/24/2016  . 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 Allergic rhinitis due to pollen 10/21/2012  . Prostate cancer ([place]) 03/22/2009  . Chronic venous insufficiency 09/07/2008  . CONSTIPATION, CHRONIC 09/07/2008  . Osteoarthritis, multiple sites 09/05/2008  . Osteoporosis 09/05/2008    Past Surgical History:  Procedure Laterality Date  . BASAL CELL CARCINOMA EXCISION  2004   Left side of face  . CATARACT EXTRACTION Bilateral 2000, 2003   both eyes  . COLONOSCOPY     h/o adenomatous polyps  . CYSTOSCOPY W/ RETROGRADES Bilateral 11/19/2016   Procedure: CYSTOSCOPY WITH RETROGRADE PYELOGRAM;  Surgeon: Hollice Espy, MD;  Location: [facility] ORS;  Service: Urology;  Laterality: Bilateral;  General vs. Spinal  . CYSTOSCOPY W/ URETERAL STENT PLACEMENT Left 02/25/2017   Procedure: CYSTOSCOPY WITH STENT REPLACEMENT;  Surgeon: Hollice Espy, MD;  Location: [facility] ORS;  Service: Urology;  Laterality: Left;  .  CYSTOSCOPY WITH URETEROSCOPY AND STENT PLACEMENT Left 11/19/2016   Procedure: CYSTOSCOPY WITH URETEROSCOPY AND STENT PLACEMENT;  Surgeon: Hollice Espy, MD;  Location: [facility] ORS;  Service: Urology;  Laterality: Left;  . FRACTURE SURGERY    . JOINT REPLACEMENT  2005   Left knee  . JOINT REPLACEMENT  12/13   Right knee  . PROSTATE SURGERY  1996   cancer  . sigmoid resection    . TIBIA FRACTURE SURGERY  1999  . URETERAL BIOPSY Left 11/19/2016   Procedure: URETERAL BIOPSY;  Surgeon: Hollice Espy, MD;  Location: [facility] ORS;  Service:  Urology;  Laterality: Left;  Marland Kitchen VOLVULUS REDUCTION  12/10    Prior to Admission medications   Medication Sig Start Date End Date Taking? Authorizing Provider  acetaminophen (TYLENOL) 325 MG tablet Take 325-650 mg by mouth every 4 (four) hours as needed for fever (general discomfort).     [provider]  alum & mag hydroxide-simeth (MAALOX/MYLANTA) 200-200-20 MG/5ML suspension Take 30 mLs by mouth every 4 (four) hours as needed for indigestion or heartburn.    [provider]  aspirin EC 81 MG tablet Take 81 mg by mouth every other day.    [provider]  Biotin 2.5 MG CAPS Take 2.5 mg by mouth daily.     [provider]  bismuth subsalicylate (KAOPECTATE) 262 MG/15ML suspension Take 10 mLs by mouth 3 (three) times daily as needed for diarrhea or loose stools.     [provider]  Calcium Carbonate-Vitamin D (CALCIUM-D) 600-400 MG-UNIT TABS Take 1 tablet by mouth daily.     [provider]  carbamide peroxide (DEBROX) 6.5 % OTIC solution 5 drops daily as needed.    [provider]  cephALEXin (KEFLEX) 500 MG capsule Take 1 capsule (500 mg total) by mouth 3 (three) times daily. 09/16/17   Cammie Sickle, MD  cholecalciferol (VITAMIN D) 1000 UNITS tablet Take 1,000 Units by mouth 2 (two) times daily.     [provider]  cyclobenzaprine (FLEXERIL) 5 MG tablet Take 5 mg by mouth at bedtime as needed for muscle spasms.    [provider]  dextromethorphan-guaiFENesin (ROBITUSSIN-DM) 10-100 MG/5ML liquid Take 10 mLs by mouth every 4 (four) hours as needed for cough.    [provider]  diphenhydrAMINE (BENADRYL) 25 mg capsule Take 25 mg by mouth every 4 (four) hours as needed for itching or allergies.    [provider]  fluPHENAZine decanoate (PROLIXIN) 25 MG/ML injection Inject into the muscle.    [provider]  fluticasone (FLONASE) 50 MCG/ACT nasal spray Place 1 spray into both  nostrils daily.    [provider]  HYDROcodone-acetaminophen (NORCO/VICODIN) 5-325 MG tablet Take 1 tablet by mouth every 6 (six) hours as needed for moderate pain. 05/07/17   Bettey Costa, MD  ibuprofen (ADVIL,MOTRIN) 200 MG tablet Take 400 mg by mouth 2 (two) times daily as needed for headache or mild pain.     [provider]  latanoprost (XALATAN) 0.005 % ophthalmic solution Place 1 drop into both eyes at bedtime.    [provider]  loratadine (CLARITIN) 10 MG tablet Take 20 mg by mouth at bedtime.    [provider]  magnesium hydroxide (MILK OF MAGNESIA) 400 MG/5ML suspension Take 30 mLs by mouth daily as needed for mild constipation.    [provider]  mupirocin ointment (BACTROBAN) 2 % Place 1 application into the nose 2 (two) times daily. 05/04/17   Gouru,  Illene Silver, MD  nystatin (NYSTATIN) powder Apply 1 g topically 2 (two) times daily as needed (rash). Use 1 application twice daily as needed for rash    [provider]  oseltamivir (TAMIFLU) 75 MG capsule  04/15/17   [provider]  penicillin v potassium (VEETID) 500 MG tablet Take 500 mg by mouth 2 (two) times daily.    [provider]  polyethylene glycol powder (GLYCOLAX/MIRALAX) powder FILL TO LINE (17 GRAMS ), MIX AND DRINK TWICE A DAY AND 8.5 GRAMS AT NOON 09/30/16   Viviana Simpler I, MD  sennosides-docusate sodium (SENOKOT-S) 8.6-50 MG tablet Take 4 tablets by mouth every evening.     [provider]  Skin Protectants, Misc. (EUCERIN) cream Apply 1 application topically daily.    [provider]  sodium chloride (OCEAN) 0.65 % SOLN nasal spray Place 1-2 sprays into both nostrils 2 (two) times daily as needed for congestion.    [provider]  traMADol (ULTRAM) 50 MG tablet Take 1 tablet (50 mg total) by mouth every 6 (six) hours as needed for moderate pain. 05/04/17   Gouru, Illene Silver, MD  triamcinolone cream (KENALOG) 0.1 % Apply 1 application  topically daily as needed.     [provider]  vitamin B-12 (CYANOCOBALAMIN) 1000 MCG tablet Take 1,000 mcg by mouth daily.    [provider]  vitamin C (ASCORBIC ACID) 500 MG tablet Take 500 mg by mouth daily.    [provider]  XTANDI 40 MG capsule TAKE 3 CAPSULES (120 MG TOTAL) BY MOUTH DAILY. 04/30/17   Cammie Sickle, MD    Allergies Other  Family History  Problem Relation Age of Onset  . Leukemia Mother   . Diabetes Son   . Prostate cancer Neg Hx   . Bladder Cancer Neg Hx   . Kidney cancer Neg Hx     Social History Social History   Tobacco Use  . Smoking status: Never Smoker  . Smokeless tobacco: Never Used  Substance Use Topics  . Alcohol use: No    Alcohol/week: 0.0 - 0.6 oz    Frequency: Never    Comment: in past  . Drug use: No    Review of Systems Constitutional: No fever/chills Eyes: No visual changes. ENT: No sore throat. No stiff neck no neck pain Cardiovascular: Denies chest pain. Respiratory: Denies shortness of breath. Gastrointestinal:   no vomiting.  No diarrhea.  No constipation. Genitourinary: Negative for dysuria. Musculoskeletal: + lower extremity swelling Skin: + for rash. Neurological: Negative for severe headaches, focal weakness or numbness.   ____________________________________________   PHYSICAL EXAM:  VITAL SIGNS: ED Triage Vitals [09/24/17 1401]  Enc Vitals Group     BP (!) 142/60     Pulse Rate 76     Resp 16     Temp 97.8 F (36.6 C)     Temp Source Oral     SpO2 97 %     Weight 182 lb (82.6 kg)     Height 6\' 2"  (1.88 m)     Head Circumference      Peak Flow      Pain Score 0     Pain Loc      Pain Edu?      Excl. in [place]?     Constitutional: Alert and oriented. Well appearing and in no acute distress. Eyes: Conjunctivae are normal Head: Atraumatic HEENT: No congestion/rhinnorhea. Mucous membranes are moist.  Oropharynx non-erythematous Neck:   Nontender with no  meningismus, no  masses, no stridor Cardiovascular: Normal rate, regular rhythm. Grossly normal heart sounds.  Good peripheral circulation. Respiratory: Normal respiratory effort.  No retractions. Lungs CTAB. Abdominal: Soft and nontender. No distention. No guarding no rebound Back:  There is no focal tenderness or step off.  there is no midline tenderness there are no lesions noted. there is no CVA tenderness Musculoskeletal: No lower extremity tenderness, no upper extremity tenderness. No joint effusions, she has erythema which is blanchable from the mid calf down, and a right greater than left 2+ toes pitting edema, there is no calf tenderness, his feet are otherwise normal in appearance, he has very strong DP and posterior tibial pulses, cap refill is normal, the both feet are warm and well-perfused, there is an erythematous region that goes up to the mid calf about it is around the foot and leg, and it is warm to touch blanchable and not raised and not particularly tender. Neurologic:  Normal speech and language. No gross focal neurologic deficits are appreciated.  Skin:  Skin is warm, dry and intact.  See above Psychiatric: Mood and affect are normal. Speech and behavior are normal.  ____________________________________________   LABS (all labs ordered are listed, but only abnormal results are displayed)  Labs Reviewed  BASIC METABOLIC PANEL  CBC WITH DIFFERENTIAL/PLATELET  BRAIN NATRIURETIC PEPTIDE    Pertinent labs  results that were available during my care of the patient were reviewed by me and considered in my medical decision making (see chart for details). ____________________________________________  EKG  I personally interpreted any EKGs ordered by me or triage  ____________________________________________  RADIOLOGY  Pertinent labs & imaging results that were available during my care of the patient were reviewed by me and considered in my medical decision making (see chart for  details). If possible, patient and/or family made aware of any abnormal findings.  US Venous Img Lower Unilateral Right  Result Date: 09/24/2017 CLINICAL DATA:  Swelling and redness in the right leg for 1 month. EXAM: RIGHT LOWER EXTREMITY VENOUS DOPPLER ULTRASOUND TECHNIQUE: Gray-scale sonography with graded compression, as well as color Doppler and duplex ultrasound were performed to evaluate the lower extremity deep venous systems from the level of the common femoral vein and including the common femoral, femoral, profunda femoral, popliteal and calf veins including the posterior tibial, peroneal and gastrocnemius veins when visible. The superficial great saphenous vein was also interrogated. Spectral Doppler was utilized to evaluate flow at rest and with distal augmentation maneuvers in the common femoral, femoral and popliteal veins. COMPARISON:  None. FINDINGS: Contralateral Common Femoral Vein: Respiratory phasicity is normal and symmetric with the symptomatic side. No evidence of thrombus. Normal compressibility. Common Femoral Vein: No evidence of thrombus. Normal compressibility, respiratory phasicity and response to augmentation. Saphenofemoral Junction: No evidence of thrombus. Normal compressibility and flow on color Doppler imaging. Profunda Femoral Vein: No evidence of thrombus. Normal compressibility and flow on color Doppler imaging. Femoral Vein: No evidence of thrombus. Normal compressibility, respiratory phasicity and response to augmentation. Popliteal Vein: No evidence of thrombus. Normal compressibility, respiratory phasicity and response to augmentation. Calf Veins: No evidence of thrombus. Normal compressibility and flow on color Doppler imaging. Superficial Great Saphenous Vein: No evidence of thrombus. Normal compressibility. Venous Reflux:  None. Other Findings:  Subcutaneous edema in the right calf. IMPRESSION: Negative for deep venous thrombosis in right lower extremity.  Electronically Signed   By: Markus Daft M.D.   On: 09/24/2017 15:27   ____________________________________________  PROCEDURES  Procedure(s) performed: None  Procedures  Critical Care performed: None  ____________________________________________   INITIAL IMPRESSION / ASSESSMENT AND PLAN / ED COURSE  Pertinent labs & imaging results that were available during my care of the patient were reviewed by me and considered in my medical decision making (see chart for details).  Patient here on antibiotics for a presumed cellulitis is not getting better was sent here for DVT ultrasound.  DVT study ultrasound is negative for clot.  This is a very isolated lower extremity right side is pathology of erythema, warmth.  No evidence of trauma no recollected trauma we will get x-rays as a precaution.  Certainly no crepitus or evidence of gangrene.  The swelling has not significantly spread but it is worse.  Other possible etiologies for this include arterial pathology but he has strong pulses, he could have a clot higher up in the system iliacs but I would think that would cause entire leg to swell up after a month not just the lower leg.   Patient's not particularly tender, and certainly does not appear to be systemically ill.  I will obtain basic blood work including CBC white count, renal function and BNP to see if there is other causes of swelling noted, and we will reassess     ____________________________________________   FINAL CLINICAL IMPRESSION(S) / ED DIAGNOSES  Final diagnoses:  None      This chart was dictated using voice recognition software.  Despite best efforts to proofread,  errors can occur which can change meaning.      Schuyler Amor, MD 09/24/17 [phone]

## 2017-09-24 NOTE — ED Notes (Signed)
Assisted pt with urinal

## 2017-09-24 NOTE — ED Triage Notes (Signed)
Swelling to right lower leg x 3 weeks. [facility] sent pt to ED to r/o DVT. Pain in calf. Redness and warmth noted to the lower leg. PT able to ambulate. Pedal pulses intact.

## 2017-10-01 ENCOUNTER — Encounter (INDEPENDENT_AMBULATORY_CARE_PROVIDER_SITE_OTHER): Payer: Self-pay | Admitting: Vascular Surgery

## 2017-10-01 ENCOUNTER — Ambulatory Visit (INDEPENDENT_AMBULATORY_CARE_PROVIDER_SITE_OTHER): Payer: Medicare Other | Admitting: Vascular Surgery

## 2017-10-01 VITALS — BP 131/70 | HR 65 | Resp 14 | Ht 73.0 in | Wt 181.0 lb

## 2017-10-01 DIAGNOSIS — I890 Lymphedema, not elsewhere classified: Secondary | ICD-10-CM | POA: Diagnosis not present

## 2017-10-01 DIAGNOSIS — I872 Venous insufficiency (chronic) (peripheral): Secondary | ICD-10-CM

## 2017-10-01 NOTE — Progress Notes (Signed)
Subjective:    Patient ID: Benjamin Villarreal, male    DOB: 1921-01-18, 84 y.o.   MRN: [id number] Chief Complaint  Patient presents with  . Follow-up    Lower leg edema   Patient seen for lymphedema.  Patient was last seen 2019.  Since his initial visit, patient has been in conservative therapy including wearing medical grade one compression socks, elevating his legs and remaining active with minimal improvement in his lower extremity edema.  The patient still experiences significant swelling and discomfort to the bilateral lower extremity.  The patient was recently seen at [facility] emergency department for bilateral lower extremity edema.  The patient denies any erythema or ulcer formation to the bilateral lower extremity.  The patient denies any claudication-like symptoms or rest pain however does note that when his lower extremity becomes edematous his discomfort progresses to the point that he feels he is unable to function on a daily basis.  He feels that his symptoms have become lifestyle limiting.  The patient denies any fever, nausea or vomiting.  Review of Systems  Constitutional: Negative.   HENT: Negative.   Eyes: Negative.   Respiratory: Negative.   Cardiovascular: Positive for leg swelling.  Gastrointestinal: Negative.   Endocrine: Negative.   Genitourinary: Negative.   Musculoskeletal: Negative.   Skin: Negative.   Allergic/Immunologic: Negative.   Neurological: Negative.   Hematological: Negative.   Psychiatric/Behavioral: Negative.       Objective:   Physical Exam  Constitutional: He is oriented to person, place, and time. He appears well-developed and well-nourished. No distress.  HENT:  Head: Normocephalic and atraumatic.  Right Ear: External ear normal.  Left Ear: External ear normal.  Eyes: Pupils are equal, round, and reactive to light. Conjunctivae and EOM are normal.  Neck: Normal range of motion.  Cardiovascular: Normal rate, regular  rhythm and normal heart sounds.  Pulses:      Radial pulses are 2+ on the right side, and 2+ on the left side.  Hard to palpate pedal pulses due to edema however the bilateral feet are warm  Pulmonary/Chest: Effort normal and breath sounds normal.  Musculoskeletal: Normal range of motion. He exhibits edema (Moderate 1+ pitting edema noted bilaterally).  Neurological: He is alert and oriented to person, place, and time.  Skin: Skin is warm and dry. He is not diaphoretic.  Psychiatric: He has a normal mood and affect. His behavior is normal. Judgment and thought content normal.  Vitals reviewed.  BP 131/70 (BP Location: Right Arm, Patient Position: Sitting)   Pulse 65   Resp 14   Ht 6\' 1"  (1.854 m)   Wt 181 lb (82.1 kg)   BMI 23.88 kg/m   Past Medical History:  Diagnosis Date  . Aortic atherosclerosis ([place])    Noted on CT  . Arthritis   . Colon polyps    adenomatous  . History of fracture of tibia   . History of tachycardia    patient unaware of this  . Kyphosis of cervicothoracic region    SEVERE  . Osteoporosis 01/2014   T -3.8 (01/2014)  . Other constipation    chronic  . Prostate cancer [facility]) 1996   surgery then lupron  . Skin cancer, basal cell 2004   surgical resection.  Marland Kitchen Unspecified venous (peripheral) insufficiency   . Vitamin B12 deficiency    Social History   Socioeconomic History  . Marital status: Widowed    Spouse name: [name]  . Number of children:  3  . Years of education: Not on file  . Highest education level: Not on file  Occupational History  . 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 Financial resource strain: Not on file  . Food insecurity:    Worry: Not on file    Inability: Not on file  . Transportation needs:    Medical: Not on file    Non-medical: Not on file  Tobacco Use  . Smoking status: Never Smoker  . Smokeless tobacco: Never Used  Substance and Sexual Activity  . Alcohol use: No    Alcohol/week:  0.0 - 1.0 standard drinks    Frequency: Never    Comment: in past  . Drug use: No  . Sexual activity: Not on file  Lifestyle  . Physical activity:    Days per week: Not on file    Minutes per session: Not on file  . Stress: Not on file  Relationships  . Social connections:    Talks on phone: Not on file    Gets together: Not on file    Attends religious service: Not on file    Active member of club or organization: Not on file    Attends meetings of clubs or organizations: Not on file    Relationship status: Not on file  . Intimate partner violence:    Fear of current or ex partner: Not on file    Emotionally abused: Not on file    Physically abused: Not on file    Forced sexual activity: Not on file  Other Topics Concern  . Not on file  Social History Narrative   Has living will   Daughter [name] holds health care POA.   Has DNR and we have reviewed this.   Would not want tube feedings if cognitively unaware   Past Surgical History:  Procedure Laterality Date  . BASAL CELL CARCINOMA EXCISION  2004   Left side of face  . CATARACT EXTRACTION Bilateral 2000, 2003   both eyes  . COLONOSCOPY     h/o adenomatous polyps  . CYSTOSCOPY W/ RETROGRADES Bilateral 11/19/2016   Procedure: CYSTOSCOPY WITH RETROGRADE PYELOGRAM;  Surgeon: Hollice Espy, MD;  Location: [facility] ORS;  Service: Urology;  Laterality: Bilateral;  General vs. Spinal  . CYSTOSCOPY W/ URETERAL STENT PLACEMENT Left 02/25/2017   Procedure: CYSTOSCOPY WITH STENT REPLACEMENT;  Surgeon: Hollice Espy, MD;  Location: [facility] ORS;  Service: Urology;  Laterality: Left;  . CYSTOSCOPY WITH URETEROSCOPY AND STENT PLACEMENT Left 11/19/2016   Procedure: CYSTOSCOPY WITH URETEROSCOPY AND STENT PLACEMENT;  Surgeon: Hollice Espy, MD;  Location: [facility] ORS;  Service: Urology;  Laterality: Left;  . FRACTURE SURGERY    . JOINT REPLACEMENT  2005   Left knee  . JOINT REPLACEMENT  12/13   Right knee  . PROSTATE SURGERY  1996    cancer  . sigmoid resection    . TIBIA FRACTURE SURGERY  1999  . URETERAL BIOPSY Left 11/19/2016   Procedure: URETERAL BIOPSY;  Surgeon: Hollice Espy, MD;  Location: [facility] ORS;  Service: Urology;  Laterality: Left;  Marland Kitchen VOLVULUS REDUCTION  12/10   Family History  Problem Relation Age of Onset  . Leukemia Mother   . Diabetes Son   . Prostate cancer Neg Hx   . Bladder Cancer Neg Hx   . Kidney cancer Neg Hx    Allergies  Allergen Reactions  . Other     RAW ONIONS- SINUS REACTION  Assessment & Plan:  Patient seen for lymphedema.  Patient was last seen 2019.  Since his initial visit, patient has been in conservative therapy including wearing medical grade one compression socks, elevating his legs and remaining active with minimal improvement in his lower extremity edema.  The patient still experiences significant swelling and discomfort to the bilateral lower extremity.  The patient was recently seen at [facility] emergency department for bilateral lower extremity edema.  The patient denies any erythema or ulcer formation to the bilateral lower extremity.  The patient denies any claudication-like symptoms or rest pain however does note that when his lower extremity becomes edematous his discomfort progresses to the point that he feels he is unable to function on a daily basis.  He feels that his symptoms have become lifestyle limiting.  The patient denies any fever, nausea or vomiting.  1. Chronic venous insufficiency - Stable As below  2. Lymphedema - Stable Despite conservative treatments including wearing medical grade 1 compression socks, elevating his legs and remaining active the patient still presents with stage II lymphedema. The patient would greatly benefit from the added therapy of a lymphedema pump The patient is asking for a prescription to purchase new compression socks.  This was given to him at today's visit. I will applied to the patient's  insurance for a lymphedema pump.  Patient is in agreement. The patient is to follow-up in 6 months so I can assess his progress with conservative therapy and the addition of a lymphedema pump. The patient is to call the office sooner if he should experience any worsening edema.  Current Outpatient Medications on File Prior to Visit  Medication Sig Dispense Refill  . acetaminophen (TYLENOL) 325 MG tablet Take 325-650 mg by mouth every 4 (four) hours as needed for fever (general discomfort).     Marland Kitchen alum & mag hydroxide-simeth (MAALOX/MYLANTA) 200-200-20 MG/5ML suspension Take 30 mLs by mouth every 4 (four) hours as needed for indigestion or heartburn.    Marland Kitchen aspirin EC 81 MG tablet Take 81 mg by mouth every other day.    . Biotin 2.5 MG CAPS Take 2.5 mg by mouth daily.     Marland Kitchen bismuth subsalicylate (KAOPECTATE) 262 MG/15ML suspension Take 10 mLs by mouth 3 (three) times daily as needed for diarrhea or loose stools.     . Calcium Carbonate-Vitamin D (CALCIUM-D) 600-400 MG-UNIT TABS Take 1 tablet by mouth daily.     . carbamide peroxide (DEBROX) 6.5 % OTIC solution 5 drops daily as needed.    . cephALEXin (KEFLEX) 500 MG capsule Take 1 capsule (500 mg total) by mouth 3 (three) times daily. 21 capsule 0  . cholecalciferol (VITAMIN D) 1000 UNITS tablet Take 1,000 Units by mouth 2 (two) times daily.     . ciclopirox (LOPROX) 0.77 % cream Apply topically.    . clindamycin (CLEOCIN) 150 MG capsule clindamycin HCl 150 mg capsule    . cyclobenzaprine (FLEXERIL) 5 MG tablet Take 5 mg by mouth at bedtime as needed for muscle spasms.    Marland Kitchen dextromethorphan-guaiFENesin (ROBITUSSIN-DM) 10-100 MG/5ML liquid Take 10 mLs by mouth every 4 (four) hours as needed for cough.    . diphenhydrAMINE (BENADRYL) 25 mg capsule Take 25 mg by mouth every 4 (four) hours as needed for itching or allergies.    Marland Kitchen doxycycline (VIBRA-TABS) 100 MG tablet doxycycline hyclate 100 mg tablet    . fluPHENAZine decanoate (PROLIXIN) 25 MG/ML  injection Inject into the muscle.    Marland Kitchen  fluticasone (FLONASE) 50 MCG/ACT nasal spray Place 1 spray into both nostrils daily.    Marland Kitchen HYDROcodone-acetaminophen (NORCO/VICODIN) 5-325 MG tablet Take 1 tablet by mouth every 6 (six) hours as needed for moderate pain. 30 tablet 0  . ibuprofen (ADVIL,MOTRIN) 200 MG tablet Take 400 mg by mouth 2 (two) times daily as needed for headache or mild pain.     Marland Kitchen latanoprost (XALATAN) 0.005 % ophthalmic solution Place 1 drop into both eyes at bedtime.    Marland Kitchen loratadine (CLARITIN) 10 MG tablet Take 20 mg by mouth at bedtime.    . magnesium hydroxide (MILK OF MAGNESIA) 400 MG/5ML suspension Take 30 mLs by mouth daily as needed for mild constipation.    . mupirocin ointment (BACTROBAN) 2 % Place 1 application into the nose 2 (two) times daily. 22 g 0  . nystatin (NYSTATIN) powder Apply 1 g topically 2 (two) times daily as needed (rash). Use 1 application twice daily as needed for rash    . oseltamivir (TAMIFLU) 75 MG capsule     . penicillin v potassium (VEETID) 500 MG tablet Take 500 mg by mouth 2 (two) times daily.    . polyethylene glycol powder (GLYCOLAX/MIRALAX) powder FILL TO LINE (17 GRAMS ), MIX AND DRINK TWICE A DAY AND 8.5 GRAMS AT NOON 510 g 3  . sennosides-docusate sodium (SENOKOT-S) 8.6-50 MG tablet Take 4 tablets by mouth every evening.     . Skin Protectants, Misc. (EUCERIN) cream Apply 1 application topically daily.    . sodium chloride (OCEAN) 0.65 % SOLN nasal spray Place 1-2 sprays into both nostrils 2 (two) times daily as needed for congestion.    . sulfamethoxazole-trimethoprim (BACTRIM DS,SEPTRA DS) 800-160 MG tablet Take 1 tablet by mouth 2 (two) times daily for 7 days. 14 tablet 0  . traMADol (ULTRAM) 50 MG tablet Take 1 tablet (50 mg total) by mouth every 6 (six) hours as needed for moderate pain. 30 tablet 0  . triamcinolone cream (KENALOG) 0.1 % Apply 1 application topically daily as needed.     . vitamin B-12 (CYANOCOBALAMIN) 1000 MCG tablet  Take 1,000 mcg by mouth daily.    . vitamin C (ASCORBIC ACID) 500 MG tablet Take 500 mg by mouth daily.    [name] 40 MG capsule TAKE 3 CAPSULES (120 MG TOTAL) BY MOUTH DAILY. 90 capsule 4   Current Facility-Administered Medications on File Prior to Visit  Medication Dose Route Frequency Provider Last Rate Last Dose  . leuprolide (LUPRON) injection 30 mg  30 mg Intramuscular Once Leia Alf, MD       There are no Patient Instructions on file for this visit. No follow-ups on file.  Barnes Florek A Lineth Thielke, PA-C

## 2017-10-02 ENCOUNTER — Other Ambulatory Visit: Payer: Medicare Other | Admitting: Urology

## 2017-10-02 ENCOUNTER — Ambulatory Visit (INDEPENDENT_AMBULATORY_CARE_PROVIDER_SITE_OTHER): Payer: Medicare Other | Admitting: Urology

## 2017-10-02 ENCOUNTER — Encounter: Payer: Self-pay | Admitting: Urology

## 2017-10-02 VITALS — BP 129/72 | HR 76 | Ht 72.0 in | Wt 182.0 lb

## 2017-10-02 DIAGNOSIS — N1330 Unspecified hydronephrosis: Secondary | ICD-10-CM | POA: Diagnosis not present

## 2017-10-02 DIAGNOSIS — C61 Malignant neoplasm of prostate: Secondary | ICD-10-CM

## 2017-10-02 LAB — URINALYSIS, COMPLETE
Bilirubin, UA: NEGATIVE
Glucose, UA: NEGATIVE
KETONES UA: NEGATIVE
Leukocytes, UA: NEGATIVE
NITRITE UA: NEGATIVE
Protein, UA: NEGATIVE
SPEC GRAV UA: 1.02 (ref 1.005–1.030)
Urobilinogen, Ur: 0.2 mg/dL (ref 0.2–1.0)
pH, UA: 5.5 (ref 5.0–7.5)

## 2017-10-02 LAB — MICROSCOPIC EXAMINATION
Bacteria, UA: NONE SEEN
EPITHELIAL CELLS (NON RENAL): NONE SEEN /HPF (ref 0–10)
WBC, UA: NONE SEEN /hpf (ref 0–5)

## 2017-10-02 MED ORDER — LIDOCAINE HCL URETHRAL/MUCOSAL 2 % EX GEL
1.0000 | Freq: Once | CUTANEOUS | Status: AC
Start: 2017-10-02 — End: 2017-10-02
  Administered 2017-10-02: 1 via URETHRAL

## 2017-10-02 MED ORDER — CIPROFLOXACIN HCL 500 MG PO TABS
500.0000 mg | ORAL_TABLET | Freq: Once | ORAL | Status: AC
  Administered 2017-10-02: 500 mg via ORAL

## 2017-10-02 NOTE — Progress Notes (Signed)
Indications: Patient is 84 y.o. male with castrate resistant prostate cancer and extrinsic ureteral obstruction.  He recently saw Dr. Erlene Quan and follow-up CT showed resolution of his adenopathy and left distal ureteral mass.  She requested he be scheduled for stent removal.  Procedure:  Flexible Cystoscopy with stent removal (73543)  Timeout was performed and the correct patient, procedure and participants were identified.    Description:  The patient was prepped and draped in the usual sterile fashion. Flexible cystosopy was performed.  The stent was visualized, grasped, and removed intact without difficulty. The patient tolerated the procedure well.  A single dose of oral antibiotics was given.  Complications:  None  Plan: Will schedule a follow-up renal ultrasound in approximately 1 month.  He was instructed to proceed to the emergency department this weekend should he experience left flank pain or unexplained fever.  John Giovanni, MD

## 2017-10-05 ENCOUNTER — Encounter: Payer: Self-pay | Admitting: Urology

## 2017-10-13 ENCOUNTER — Other Ambulatory Visit: Payer: Self-pay | Admitting: Internal Medicine

## 2017-10-13 DIAGNOSIS — C61 Malignant neoplasm of prostate: Secondary | ICD-10-CM

## 2017-10-14 ENCOUNTER — Inpatient Hospital Stay: Payer: Medicare Other | Attending: Internal Medicine

## 2017-10-14 ENCOUNTER — Inpatient Hospital Stay (HOSPITAL_BASED_OUTPATIENT_CLINIC_OR_DEPARTMENT_OTHER): Payer: Medicare Other | Admitting: Internal Medicine

## 2017-10-14 VITALS — BP 127/67 | HR 71 | Temp 97.2°F | Resp 16 | Wt 184.0 lb

## 2017-10-14 DIAGNOSIS — R531 Weakness: Secondary | ICD-10-CM | POA: Insufficient documentation

## 2017-10-14 DIAGNOSIS — M810 Age-related osteoporosis without current pathological fracture: Secondary | ICD-10-CM | POA: Insufficient documentation

## 2017-10-14 DIAGNOSIS — I700 Atherosclerosis of aorta: Secondary | ICD-10-CM | POA: Diagnosis not present

## 2017-10-14 DIAGNOSIS — Z192 Hormone resistant malignancy status: Secondary | ICD-10-CM

## 2017-10-14 DIAGNOSIS — N1330 Unspecified hydronephrosis: Secondary | ICD-10-CM | POA: Insufficient documentation

## 2017-10-14 DIAGNOSIS — D509 Iron deficiency anemia, unspecified: Secondary | ICD-10-CM

## 2017-10-14 DIAGNOSIS — Z79899 Other long term (current) drug therapy: Secondary | ICD-10-CM

## 2017-10-14 DIAGNOSIS — E538 Deficiency of other specified B group vitamins: Secondary | ICD-10-CM | POA: Diagnosis not present

## 2017-10-14 DIAGNOSIS — M129 Arthropathy, unspecified: Secondary | ICD-10-CM

## 2017-10-14 DIAGNOSIS — C778 Secondary and unspecified malignant neoplasm of lymph nodes of multiple regions: Secondary | ICD-10-CM | POA: Diagnosis not present

## 2017-10-14 DIAGNOSIS — R5383 Other fatigue: Secondary | ICD-10-CM | POA: Insufficient documentation

## 2017-10-14 DIAGNOSIS — Z7982 Long term (current) use of aspirin: Secondary | ICD-10-CM | POA: Diagnosis not present

## 2017-10-14 DIAGNOSIS — Z85828 Personal history of other malignant neoplasm of skin: Secondary | ICD-10-CM | POA: Diagnosis not present

## 2017-10-14 DIAGNOSIS — L03115 Cellulitis of right lower limb: Secondary | ICD-10-CM

## 2017-10-14 DIAGNOSIS — C61 Malignant neoplasm of prostate: Secondary | ICD-10-CM

## 2017-10-14 DIAGNOSIS — M7989 Other specified soft tissue disorders: Secondary | ICD-10-CM | POA: Diagnosis not present

## 2017-10-14 LAB — PSA: Prostatic Specific Antigen: 0.96 ng/mL (ref 0.00–4.00)

## 2017-10-14 MED ORDER — ENZALUTAMIDE 40 MG PO CAPS: 160.0000 mg | ORAL_CAPSULE | Freq: Every day | ORAL | 4 refills | Status: DC

## 2017-10-14 MED ORDER — ENZALUTAMIDE 40 MG PO CAPS: 80.0000 mg | ORAL_CAPSULE | Freq: Every day | ORAL | 4 refills | Status: DC

## 2017-10-14 MED FILL — XTANDI 40 MG CAPSULE: 40 | 30 days supply | Qty: 60 | Fill #0

## 2017-10-14 NOTE — Progress Notes (Signed)
[place] OFFICE PROGRESS NOTE  Patient Care Team: Venia Carbon, MD as PCP - General  Cancer Staging No matching staging information was found for the patient.   Oncology History    # 1996 PROSTATE CANCER [Gleason score 2+3] s/p Radical Prostatectomy [[facility]]; ?Biochem RECURRENT PROSTATE CANCER-Hormone sensitive;  Intermittent Lupron; on Lupron q 41M [Jan 2016 PSA- 7.9].  HOLD LUPRON in MAY 2017; AUg 2017- Re-start  # OCT 2018- Xtandi 3 pills a day; OCT 30th PET- Retroperitoneal/pelvic adenopathy/recurrence lateral bladder wall; NO Bone lesions.   # Osteoporosis [BMD- 0347]- on Reclast q 57M; FEB 2017- Start Prolia q 6 M -------------------------------------------------------------   DIAGNOSIS: Recurrent metastatic prostate cancer castrate resistant  STAGE:   4      ;GOALS: Palliative  CURRENT/MOST RECENT THERAPY : Xtandi      Prostate cancer [facility])      INTERVAL HISTORY:  Benjamin Villarreal 84 y.o.  male pleasant patient above history of metastatic castrate resistant prostate cancer is here for follow-up.  Patient [name] was held last visit because of worsening fatigue swelling in the legs.  Patient has been treated with antibiotics for his possible cellulitis of right leg; also treated with Lasix for leg swelling.  It significantly improved.  Patient interim also had a CT scan with urology that showed improvement of his pelvic adenopathy; awaiting ultrasound end of the week.  Patient continues to have mild fatigue.  Overall improved.  Review of Systems  Constitutional: Positive for malaise/fatigue. Negative for chills, diaphoresis, fever and weight loss.  HENT: Negative for nosebleeds and sore throat.   Eyes: Negative for double vision.  Respiratory: Negative for cough, hemoptysis, sputum production, shortness of breath and wheezing.   Cardiovascular: Positive for leg swelling (Right more than left.). Negative for chest pain, palpitations and orthopnea.   Gastrointestinal: Negative for abdominal pain, blood in stool, constipation, diarrhea, heartburn, melena, nausea and vomiting.  Genitourinary: Negative for dysuria, frequency and urgency.  Musculoskeletal: Negative for back pain and joint pain.  Skin: Negative.  Negative for itching and rash.  Neurological: Negative for dizziness, tingling, focal weakness, weakness and headaches.  Endo/Heme/Allergies: Does not bruise/bleed easily.  Psychiatric/Behavioral: Negative for depression. The patient is not nervous/anxious and does not have insomnia.       PAST MEDICAL HISTORY :  Past Medical History:  Diagnosis Date  . Aortic atherosclerosis ([place])    Noted on CT  . Arthritis   . Colon polyps    adenomatous  . History of fracture of tibia   . History of tachycardia    patient unaware of this  . Kyphosis of cervicothoracic region    SEVERE  . Osteoporosis 01/2014   T -3.8 (01/2014)  . Other constipation    chronic  . Prostate cancer [facility]) 1996   surgery then lupron  . Skin cancer, basal cell 2004   surgical resection.  Marland Kitchen Unspecified venous (peripheral) insufficiency   . Vitamin B12 deficiency     PAST SURGICAL HISTORY :   Past Surgical History:  Procedure Laterality Date  . BASAL CELL CARCINOMA EXCISION  2004   Left side of face  . CATARACT EXTRACTION Bilateral 2000, 2003   both eyes  . COLONOSCOPY     h/o adenomatous polyps  . CYSTOSCOPY W/ RETROGRADES Bilateral 11/19/2016   Procedure: CYSTOSCOPY WITH RETROGRADE PYELOGRAM;  Surgeon: Hollice Espy, MD;  Location: [facility] ORS;  Service: Urology;  Laterality: Bilateral;  General vs. Spinal  . CYSTOSCOPY W/ URETERAL STENT PLACEMENT Left  02/25/2017   Procedure: CYSTOSCOPY WITH STENT REPLACEMENT;  Surgeon: Hollice Espy, MD;  Location: [facility] ORS;  Service: Urology;  Laterality: Left;  . CYSTOSCOPY WITH URETEROSCOPY AND STENT PLACEMENT Left 11/19/2016   Procedure: CYSTOSCOPY WITH URETEROSCOPY AND STENT PLACEMENT;  Surgeon: Hollice Espy, MD;  Location: [facility] ORS;  Service: Urology;  Laterality: Left;  . FRACTURE SURGERY    . JOINT REPLACEMENT  2005   Left knee  . JOINT REPLACEMENT  12/13   Right knee  . PROSTATE SURGERY  1996   cancer  . sigmoid resection    . TIBIA FRACTURE SURGERY  1999  . URETERAL BIOPSY Left 11/19/2016   Procedure: URETERAL BIOPSY;  Surgeon: Hollice Espy, MD;  Location: [facility] ORS;  Service: Urology;  Laterality: Left;  Marland Kitchen VOLVULUS REDUCTION  12/10    FAMILY HISTORY :   Family History  Problem Relation Age of Onset  . Leukemia Mother   . Diabetes Son   . Prostate cancer Neg Hx   . Bladder Cancer Neg Hx   . Kidney cancer Neg Hx     SOCIAL HISTORY:   Social History   Tobacco Use  . Smoking status: Never Smoker  . Smokeless tobacco: Never Used  Substance Use Topics  . Alcohol use: No    Alcohol/week: 0.0 - 1.0 standard drinks    Frequency: Never    Comment: in past  . Drug use: No    ALLERGIES:  is allergic to other.  MEDICATIONS:  Current Outpatient Medications  Medication Sig Dispense Refill  . acetaminophen (TYLENOL) 325 MG tablet Take 325-650 mg by mouth every 4 (four) hours as needed for fever (general discomfort).     Marland Kitchen aspirin EC 81 MG tablet Take 81 mg by mouth every other day.    . Biotin 2.5 MG CAPS Take 2.5 mg by mouth daily.     Marland Kitchen bismuth subsalicylate (KAOPECTATE) 262 MG/15ML suspension Take 10 mLs by mouth 3 (three) times daily as needed for diarrhea or loose stools.     . Calcium Carbonate-Vitamin D (CALCIUM-D) 600-400 MG-UNIT TABS Take 1 tablet by mouth daily.     . carbamide peroxide (DEBROX) 6.5 % OTIC solution 5 drops daily as needed.    . cephALEXin (KEFLEX) 500 MG capsule Take 1 capsule (500 mg total) by mouth 3 (three) times daily. 21 capsule 0  . cyclobenzaprine (FLEXERIL) 5 MG tablet Take 5 mg by mouth at bedtime as needed for muscle spasms.    . fluticasone (FLONASE) 50 MCG/ACT nasal spray Place 1 spray into both nostrils daily.    Marland Kitchen ibuprofen  (ADVIL,MOTRIN) 200 MG tablet Take 400 mg by mouth 2 (two) times daily as needed for headache or mild pain.     Marland Kitchen latanoprost (XALATAN) 0.005 % ophthalmic solution Place 1 drop into both eyes at bedtime.    . mupirocin ointment (BACTROBAN) 2 % Place 1 application into the nose 2 (two) times daily. 22 g 0  . polyethylene glycol powder (GLYCOLAX/MIRALAX) powder FILL TO LINE (17 GRAMS ), MIX AND DRINK TWICE A DAY AND 8.5 GRAMS AT NOON 510 g 3  . Skin Protectants, Misc. (EUCERIN) cream Apply 1 application topically daily.    . sodium chloride (OCEAN) 0.65 % SOLN nasal spray Place 1-2 sprays into both nostrils 2 (two) times daily as needed for congestion.    . traMADol (ULTRAM) 50 MG tablet Take 1 tablet (50 mg total) by mouth every 6 (six) hours as needed for moderate pain. 30 tablet  0  . triamcinolone cream (KENALOG) 0.1 % Apply 1 application topically daily as needed.     . vitamin B-12 (CYANOCOBALAMIN) 1000 MCG tablet Take 1,000 mcg by mouth daily.    . vitamin C (ASCORBIC ACID) 500 MG tablet Take 500 mg by mouth daily.    Marland Kitchen alum & mag hydroxide-simeth (MAALOX/MYLANTA) 200-200-20 MG/5ML suspension Take 30 mLs by mouth every 4 (four) hours as needed for indigestion or heartburn.    . cholecalciferol (VITAMIN D) 1000 UNITS tablet Take 1,000 Units by mouth 2 (two) times daily.     . ciclopirox (LOPROX) 0.77 % cream Apply topically.    . clindamycin (CLEOCIN) 150 MG capsule clindamycin HCl 150 mg capsule    . dextromethorphan-guaiFENesin (ROBITUSSIN-DM) 10-100 MG/5ML liquid Take 10 mLs by mouth every 4 (four) hours as needed for cough.    . diphenhydrAMINE (BENADRYL) 25 mg capsule Take 25 mg by mouth every 4 (four) hours as needed for itching or allergies.    . enzalutamide (XTANDI) 40 MG capsule Take 2 capsules (80 mg total) by mouth daily. 60 capsule 4  . fluPHENAZine decanoate (PROLIXIN) 25 MG/ML injection Inject into the muscle.    Marland Kitchen HYDROcodone-acetaminophen (NORCO/VICODIN) 5-325 MG tablet Take 1  tablet by mouth every 6 (six) hours as needed for moderate pain. (Patient not taking: Reported on 10/14/2017) 30 tablet 0  . loratadine (CLARITIN) 10 MG tablet Take 20 mg by mouth at bedtime.    Marland Kitchen nystatin (NYSTATIN) powder Apply 1 g topically 2 (two) times daily as needed (rash). Use 1 application twice daily as needed for rash    . oseltamivir (TAMIFLU) 75 MG capsule     . penicillin v potassium (VEETID) 500 MG tablet Take 500 mg by mouth 2 (two) times daily.    . sennosides-docusate sodium (SENOKOT-S) 8.6-50 MG tablet Take 4 tablets by mouth every evening.      No current facility-administered medications for this visit.    Facility-Administered Medications Ordered in Other Visits  Medication Dose Route Frequency Provider Last Rate Last Dose  . leuprolide (LUPRON) injection 30 mg  30 mg Intramuscular Once Leia Alf, MD        PHYSICAL EXAMINATION: ECOG PERFORMANCE STATUS: 2 - Symptomatic, <50% confined to bed  BP 127/67 (BP Location: Left Arm, Patient Position: Sitting)   Pulse 71   Temp (!) 97.2 F (36.2 C) (Tympanic)   Resp 16   Wt 184 lb (83.5 kg)   BMI 24.95 kg/m   Filed Weights   10/14/17 1114  Weight: 184 lb (83.5 kg)    Physical Exam  Constitutional: He is oriented to person, place, and time and well-developed, well-nourished, and in no distress.  Elderly Caucasian male patient.  He walks with a walker.  Accompanied by his daughter.  HENT:  Head: Normocephalic and atraumatic.  Mouth/Throat: Oropharynx is clear and moist. No oropharyngeal exudate.  Eyes: Pupils are equal, round, and reactive to light.  Neck: Normal range of motion. Neck supple.  Cardiovascular: Normal rate and regular rhythm.  Pulmonary/Chest: No respiratory distress. He has no wheezes.  Abdominal: Soft. Bowel sounds are normal. He exhibits no distension and no mass. There is no tenderness. There is no rebound and no guarding.  Musculoskeletal: Normal range of motion. He exhibits edema. He  exhibits no tenderness.  Right leg swollen compared to left.  Chronic.  Neurological: He is alert and oriented to person, place, and time.  Skin: Skin is warm.  Psychiatric: Affect normal.  LABORATORY DATA:  I have reviewed the data as listed    Component Value Date/Time   NA 137 09/24/2017 1625   NA 140 02/11/2012 0415   K 4.3 09/24/2017 1625   K 3.9 02/11/2012 0415   CL 106 09/24/2017 1625   CL 108 (H) 02/11/2012 0415   CO2 23 09/24/2017 1625   CO2 25 02/11/2012 0415   GLUCOSE 136 (H) 09/24/2017 1625   GLUCOSE 125 (H) 02/11/2012 0415   BUN 21 09/24/2017 1625   BUN 12 02/11/2012 0415   CREATININE 0.70 09/24/2017 1625   CREATININE 1.00 03/23/2014 1400   CALCIUM 8.9 09/24/2017 1625   CALCIUM 8.3 (L) 03/23/2014 1400   PROT 7.3 09/16/2017 0949   PROT 7.0 03/07/2014 1414   ALBUMIN 3.9 09/16/2017 0949   ALBUMIN 3.7 03/07/2014 1414   AST 24 09/16/2017 0949   AST 16 03/07/2014 1414   ALT 12 09/16/2017 0949   ALT 19 03/07/2014 1414   ALKPHOS 52 09/16/2017 0949   ALKPHOS 69 03/07/2014 1414   BILITOT 0.7 09/16/2017 0949   BILITOT 0.3 03/07/2014 1414   GFRNONAA >60 09/24/2017 1625   GFRNONAA >60 03/23/2014 1400   GFRNONAA 49 (L) 09/02/2013 1428   GFRAA >60 09/24/2017 1625   GFRAA >60 03/23/2014 1400   GFRAA 57 (L) 09/02/2013 1428    No results found for: SPEP, UPEP  Lab Results  Component Value Date   WBC 7.1 09/24/2017   NEUTROABS 3.7 09/24/2017   HGB 12.8 (L) 09/24/2017   HCT 36.3 (L) 09/24/2017   MCV 91.4 09/24/2017   PLT 145 (L) 09/24/2017      Chemistry      Component Value Date/Time   NA 137 09/24/2017 1625   NA 140 02/11/2012 0415   K 4.3 09/24/2017 1625   K 3.9 02/11/2012 0415   CL 106 09/24/2017 1625   CL 108 (H) 02/11/2012 0415   CO2 23 09/24/2017 1625   CO2 25 02/11/2012 0415   BUN 21 09/24/2017 1625   BUN 12 02/11/2012 0415   CREATININE 0.70 09/24/2017 1625   CREATININE 1.00 03/23/2014 1400   GLU 114 06/30/2016      Component Value  Date/Time   CALCIUM 8.9 09/24/2017 1625   CALCIUM 8.3 (L) 03/23/2014 1400   ALKPHOS 52 09/16/2017 0949   ALKPHOS 69 03/07/2014 1414   AST 24 09/16/2017 0949   AST 16 03/07/2014 1414   ALT 12 09/16/2017 0949   ALT 19 03/07/2014 1414   BILITOT 0.7 09/16/2017 0949   BILITOT 0.3 03/07/2014 1414       RADIOGRAPHIC STUDIES: I have personally reviewed the radiological images as listed and agreed with the findings in the report. No results found.   ASSESSMENT & PLAN:  Prostate cancer ([place]) # CASTRATE RESISTANT PROSTATE CANCER- Metastatic disease with left bladder invasion/obstruction.  On Xtandi significant improvement noted onJuly 30th CT; with improvement of his pelvic adenopathy.  #Xtandi was held approximately month ago for fatigue; I think is reasonable to restart Xtandi at this time but 2 pills a day.  New prescription initiated.  Discussed with pharmacist.  # Severe osteoporosis: Currently on Prolia; plan today.  Stable  #Left ureteral stent placement/hydronephrosis: Status post exchange.  July 30 CT scan improved.  Awaiting Kidney US-this Friday.  # Iron deficiency anemia: Improved stable continue p.o. iron.  #Right lower extremity cellulitis/dermatitis chronic-improved status post antibiotics/Lasix.   Follow-up in 1 month; Lupron again due in October.   Orders Placed This Encounter  Procedures  .  Comprehensive metabolic panel    Standing Status:   Future    Standing Expiration Date:   10/15/2018  . CBC with Differential    Standing Status:   Future    Standing Expiration Date:   10/15/2018  . PSA    Standing Status:   Future    Standing Expiration Date:   10/15/2018   All questions were answered. The patient knows to call the clinic with any problems, questions or concerns.      Cammie Sickle, MD 10/14/2017 12:51 PM

## 2017-10-14 NOTE — Assessment & Plan Note (Addendum)
#   CASTRATE RESISTANT PROSTATE CANCER- Metastatic disease with left bladder invasion/obstruction.  On Xtandi significant improvement noted onJuly 30th CT; with improvement of his pelvic adenopathy.  #Xtandi was held approximately month ago for fatigue; I think is reasonable to restart Xtandi at this time but 2 pills a day.  New prescription initiated.  Discussed with pharmacist.  # Severe osteoporosis: Currently on Prolia; plan today.  Stable  #Left ureteral stent placement/hydronephrosis: Status post exchange.  July 30 CT scan improved.  Awaiting Kidney US-this Friday.  # Iron deficiency anemia: Improved stable continue p.o. iron.  #Right lower extremity cellulitis/dermatitis chronic-improved status post antibiotics/Lasix.   Follow-up in 1 month; Lupron again due in October.

## 2017-10-16 ENCOUNTER — Ambulatory Visit
Admission: RE | Admit: 2017-10-16 | Discharge: 2017-10-16 | Disposition: A | Discharge status: Home / Self Care | Payer: Medicare Other | Source: Ambulatory Visit | Attending: Urology | Admitting: Urology

## 2017-10-16 ENCOUNTER — Ambulatory Visit: Payer: Medicare Other | Admitting: Internal Medicine

## 2017-10-16 DIAGNOSIS — M150 Primary generalized (osteo)arthritis: Secondary | ICD-10-CM

## 2017-10-16 DIAGNOSIS — C61 Malignant neoplasm of prostate: Secondary | ICD-10-CM | POA: Insufficient documentation

## 2017-10-16 DIAGNOSIS — I872 Venous insufficiency (chronic) (peripheral): Secondary | ICD-10-CM | POA: Diagnosis not present

## 2017-10-16 DIAGNOSIS — K5909 Other constipation: Secondary | ICD-10-CM

## 2017-10-16 DIAGNOSIS — I700 Atherosclerosis of aorta: Secondary | ICD-10-CM

## 2017-10-16 DIAGNOSIS — M818 Other osteoporosis without current pathological fracture: Secondary | ICD-10-CM | POA: Diagnosis not present

## 2017-10-16 DIAGNOSIS — N1330 Unspecified hydronephrosis: Secondary | ICD-10-CM | POA: Diagnosis not present

## 2017-10-16 DIAGNOSIS — N281 Cyst of kidney, acquired: Secondary | ICD-10-CM | POA: Diagnosis not present

## 2017-10-16 DIAGNOSIS — M159 Polyosteoarthritis, unspecified: Secondary | ICD-10-CM

## 2017-10-16 DIAGNOSIS — N1339 Other hydronephrosis: Secondary | ICD-10-CM | POA: Diagnosis not present

## 2017-10-20 ENCOUNTER — Telehealth: Payer: Self-pay

## 2017-10-20 NOTE — Telephone Encounter (Signed)
-----   Message from Hollice Espy, MD sent at 10/20/2017 10:19 AM EDT ----- Renal ultrasound looks great.  No evidence of hydronephrosis.  No need for further stenting.  Recommend continue follow-up with medical oncology, to be seen by [place] as needed.  Hollice Espy, MD

## 2017-10-20 NOTE — Telephone Encounter (Signed)
Pt daughter informed, she expresses understanding.

## 2017-10-22 ENCOUNTER — Encounter: Payer: Self-pay | Admitting: Internal Medicine

## 2017-10-22 DIAGNOSIS — E8770 Fluid overload, unspecified: Secondary | ICD-10-CM | POA: Diagnosis not present

## 2017-10-22 DIAGNOSIS — I509 Heart failure, unspecified: Secondary | ICD-10-CM | POA: Diagnosis not present

## 2017-10-22 NOTE — Assessment & Plan Note (Signed)
Seeing vascular Continue Lasix, Pen V and compression socks

## 2017-10-22 NOTE — Assessment & Plan Note (Signed)
Asymptomatic Continue daily ASA

## 2017-10-22 NOTE — Assessment & Plan Note (Signed)
Continue Lupron injections Continue to follow with oncology

## 2017-10-22 NOTE — Assessment & Plan Note (Signed)
Continue Miralax and Senna Encouraged adequate fiber and fluid intake

## 2017-10-22 NOTE — Progress Notes (Signed)
Subjective:    Patient ID: Benjamin Villarreal, male    DOB: 11/23/1920, 84 y.o.   MRN: [id number]  HPI  Routine follow up for resident in [address] Reviewed with RN. RN reports edema of RLE. Getting Pen V, Lasix and wearing compression socks. Now has dependent edema of RUE, which is causing some pain. Otherwise, he sleeps well. Has some issues with nocturia which is stable. He walks with rollator. His appetite is good, weight up, likely due to fluid retention. Voids fine. He reports intermittent constipation, but getting Senna and Miralax daily. He denies reflux, chest pain or shortness of breath.  Aortic Atherosclerosis: Asymptomatic. On daily ASA.  OA: Pain controlled with prn Tylenol and Ultram.  Osteoporosis: Getting Reclast injections. Bone density from 01/2014 reviewed.  CVI: Currently on Lasix, Pen V and wearing compression hose. He follows with vascular.  Hx of Prostate CA: Continue getting Lupron injection through oncology.    Review of Systems      Past Medical History:  Diagnosis Date  . Aortic atherosclerosis ([place])    Noted on CT  . Arthritis   . Colon polyps    adenomatous  . History of fracture of tibia   . History of tachycardia    patient unaware of this  . Kyphosis of cervicothoracic region    SEVERE  . Osteoporosis 01/2014   T -3.8 (01/2014)  . Other constipation    chronic  . Prostate cancer [facility]) 1996   surgery then lupron  . Skin cancer, basal cell 2004   surgical resection.  Marland Kitchen Unspecified venous (peripheral) insufficiency   . Vitamin B12 deficiency     Current Outpatient Medications  Medication Sig Dispense Refill  . aspirin EC 81 MG tablet Take 81 mg by mouth every other day.    . Biotin 2.5 MG CAPS Take 2.5 mg by mouth daily.     . Calcium Carbonate-Vitamin D (CALCIUM-D) 600-400 MG-UNIT TABS Take 1 tablet by mouth daily.     . cholecalciferol (VITAMIN D) 1000 UNITS tablet Take 1,000 Units by mouth 2 (two) times daily.     . ciclopirox (LOPROX)  0.77 % cream Apply topically.    . clindamycin (CLEOCIN) 150 MG capsule clindamycin HCl 150 mg capsule    . enzalutamide (XTANDI) 40 MG capsule Take 2 capsules (80 mg total) by mouth daily. 60 capsule 4  . fluPHENAZine decanoate (PROLIXIN) 25 MG/ML injection Inject into the muscle.    . fluticasone (FLONASE) 50 MCG/ACT nasal spray Place 1 spray into both nostrils daily.    Marland Kitchen latanoprost (XALATAN) 0.005 % ophthalmic solution Place 1 drop into both eyes at bedtime.    Marland Kitchen loratadine (CLARITIN) 10 MG tablet Take 20 mg by mouth at bedtime.    . penicillin v potassium (VEETID) 500 MG tablet Take 500 mg by mouth 2 (two) times daily.    . polyethylene glycol powder (GLYCOLAX/MIRALAX) powder FILL TO LINE (17 GRAMS ), MIX AND DRINK TWICE A DAY AND 8.5 GRAMS AT NOON 510 g 3  . sennosides-docusate sodium (SENOKOT-S) 8.6-50 MG tablet Take 4 tablets by mouth every evening.     . Skin Protectants, Misc. (EUCERIN) cream Apply 1 application topically daily.    . vitamin B-12 (CYANOCOBALAMIN) 1000 MCG tablet Take 1,000 mcg by mouth daily.    . vitamin C (ASCORBIC ACID) 500 MG tablet Take 500 mg by mouth daily.    Marland Kitchen acetaminophen (TYLENOL) 325 MG tablet Take 325-650 mg by mouth every 4 (four) hours  as needed for fever (general discomfort).     Marland Kitchen alum & mag hydroxide-simeth (MAALOX/MYLANTA) 200-200-20 MG/5ML suspension Take 30 mLs by mouth every 4 (four) hours as needed for indigestion or heartburn.    . bismuth subsalicylate (KAOPECTATE) 262 MG/15ML suspension Take 10 mLs by mouth 3 (three) times daily as needed for diarrhea or loose stools.     . carbamide peroxide (DEBROX) 6.5 % OTIC solution 5 drops daily as needed.    . cyclobenzaprine (FLEXERIL) 5 MG tablet Take 5 mg by mouth at bedtime as needed for muscle spasms.    Marland Kitchen dextromethorphan-guaiFENesin (ROBITUSSIN-DM) 10-100 MG/5ML liquid Take 10 mLs by mouth every 4 (four) hours as needed for cough.    . diphenhydrAMINE (BENADRYL) 25 mg capsule Take 25 mg by mouth  every 4 (four) hours as needed for itching or allergies.    Marland Kitchen HYDROcodone-acetaminophen (NORCO/VICODIN) 5-325 MG tablet Take 1 tablet by mouth every 6 (six) hours as needed for moderate pain. (Patient not taking: Reported on 10/14/2017) 30 tablet 0  . ibuprofen (ADVIL,MOTRIN) 200 MG tablet Take 400 mg by mouth 2 (two) times daily as needed for headache or mild pain.     . mupirocin ointment (BACTROBAN) 2 % Place 1 application into the nose 2 (two) times daily. (Patient not taking: Reported on 10/22/2017) 22 g 0  . nystatin (NYSTATIN) powder Apply 1 g topically 2 (two) times daily as needed (rash). Use 1 application twice daily as needed for rash    . sodium chloride (OCEAN) 0.65 % SOLN nasal spray Place 1-2 sprays into both nostrils 2 (two) times daily as needed for congestion.    . traMADol (ULTRAM) 50 MG tablet Take 1 tablet (50 mg total) by mouth every 6 (six) hours as needed for moderate pain. (Patient not taking: Reported on 10/22/2017) 30 tablet 0  . triamcinolone cream (KENALOG) 0.1 % Apply 1 application topically daily as needed.      No current facility-administered medications for this visit.    Facility-Administered Medications Ordered in Other Visits  Medication Dose Route Frequency Provider Last Rate Last Dose  . leuprolide (LUPRON) injection 30 mg  30 mg Intramuscular Once Leia Alf, MD        Allergies  Allergen Reactions  . Other     RAW ONIONS- SINUS REACTION    Family History  Problem Relation Age of Onset  . Leukemia Mother   . Diabetes Son   . Prostate cancer Neg Hx   . Bladder Cancer Neg Hx   . Kidney cancer Neg Hx     Social History   Socioeconomic History  . Marital status: Widowed    Spouse name: [name]  . Number of children: 3  . Years of education: Not on file  . Highest education level: Not on file  Occupational History  . 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 Financial resource strain: Not on file  . Food  insecurity:    Worry: Not on file    Inability: Not on file  . Transportation needs:    Medical: Not on file    Non-medical: Not on file  Tobacco Use  . Smoking status: Never Smoker  . Smokeless tobacco: Never Used  Substance and Sexual Activity  . Alcohol use: No    Alcohol/week: 0.0 - 1.0 standard drinks    Frequency: Never    Comment: in past  . Drug use: No  . Sexual activity: Not Currently  Lifestyle  .  Physical activity:    Days per week: Not on file    Minutes per session: Not on file  . Stress: Not on file  Relationships  . Social connections:    Talks on phone: Not on file    Gets together: Not on file    Attends religious service: Not on file    Active member of club or organization: Not on file    Attends meetings of clubs or organizations: Not on file    Relationship status: Not on file  . Intimate partner violence:    Fear of current or ex partner: Not on file    Emotionally abused: Not on file    Physically abused: Not on file    Forced sexual activity: Not on file  Other Topics Concern  . Not on file  Social History Narrative   Has living will   Daughter [name] holds health care POA.   Has DNR and we have reviewed this.   Would not want tube feedings if cognitively unaware     Constitutional: Denies fever, malaise, fatigue, headache or abrupt weight changes.  HEENT: Denies eye pain, eye redness, ear pain, ringing in the ears, wax buildup, runny nose, nasal congestion, bloody nose, or sore throat. Respiratory: Denies difficulty breathing, shortness of breath, cough or sputum production.   Cardiovascular: Pt reports swelling of RLE, RUE. Denies chest pain, chest tightness, palpitations or swelling in the hands.  Gastrointestinal: Pt reports intermittent constipation. Denies abdominal pain, bloating, diarrhea or blood in the stool.  GU: Denies urgency, frequency, pain with urination, burning sensation, blood in urine, odor or  discharge. Musculoskeletal: Denies decrease in range of motion, difficulty with gait, muscle pain or joint pain and swelling.  Skin: Denies redness, rashes, lesions or ulcercations.  Neurological: Denies dizziness, difficulty with memory, difficulty with speech or problems with balance and coordination.  Psych: Denies anxiety, depression, SI/HI.  No other specific complaints in a complete review of systems (except as listed in HPI above).  Objective:   Physical Exam   BP 125/72   Pulse 75   Temp 97.9 F (36.6 C)   Resp 18   Wt 184 lb 3.2 oz (83.6 kg)   BMI 24.98 kg/m  Wt Readings from Last 3 Encounters:  10/22/17 184 lb 3.2 oz (83.6 kg)  10/14/17 184 lb (83.5 kg)  10/02/17 182 lb (82.6 kg)    General: Appears his stated age, well developed, well nourished in NAD. Skin: Warm, dry and intact. No redness or warmth of RUE/RLE. Neck:  No nodes. Cardiovascular: Normal rate and rhythm. Murmur noted. 1 + pitting edema RLE. He has dependent edema of the right bicep. Pulmonary/Chest: Normal effort and positive vesicular breath sounds. No respiratory distress. No wheezes, rales or ronchi noted.  Abdomen: Soft and nontender. Normal bowel sounds.  Musculoskeletal: Gait slow and steady with use of rolling walker. Neurological: Alert and oriented.    BMET    Component Value Date/Time   NA 137 09/24/2017 1625   NA 140 02/11/2012 0415   K 4.3 09/24/2017 1625   K 3.9 02/11/2012 0415   CL 106 09/24/2017 1625   CL 108 (H) 02/11/2012 0415   CO2 23 09/24/2017 1625   CO2 25 02/11/2012 0415   GLUCOSE 136 (H) 09/24/2017 1625   GLUCOSE 125 (H) 02/11/2012 0415   BUN 21 09/24/2017 1625   BUN 12 02/11/2012 0415   CREATININE 0.70 09/24/2017 1625   CREATININE 1.00 03/23/2014 1400   CALCIUM 8.9  09/24/2017 1625   CALCIUM 8.3 (L) 03/23/2014 1400   GFRNONAA >60 09/24/2017 1625   GFRNONAA >60 03/23/2014 1400   GFRNONAA 49 (L) 09/02/2013 1428   GFRAA >60 09/24/2017 1625   GFRAA >60 03/23/2014  1400   GFRAA 57 (L) 09/02/2013 1428    Lipid Panel  No results found for: CHOL, TRIG, HDL, CHOLHDL, VLDL, LDLCALC  CBC    Component Value Date/Time   WBC 7.1 09/24/2017 1625   RBC 3.97 (L) 09/24/2017 1625   HGB 12.8 (L) 09/24/2017 1625   HGB 11.3 (L) 03/07/2014 1414   HCT 36.3 (L) 09/24/2017 1625   HCT 34.2 (L) 03/07/2014 1414   PLT 145 (L) 09/24/2017 1625   PLT 143 (L) 03/07/2014 1414   MCV 91.4 09/24/2017 1625   MCV 84 03/07/2014 1414   MCH 32.1 09/24/2017 1625   MCHC 35.2 09/24/2017 1625   RDW 14.1 09/24/2017 1625   RDW 15.2 (H) 03/07/2014 1414   LYMPHSABS 2.2 09/24/2017 1625   LYMPHSABS 1.8 03/07/2014 1414   MONOABS 0.7 09/24/2017 1625   MONOABS 0.5 03/07/2014 1414   EOSABS 0.3 09/24/2017 1625   EOSABS 0.3 03/07/2014 1414   BASOSABS 0.1 09/24/2017 1625   BASOSABS 0.2 (H) 03/07/2014 1414    Hgb A1C No results found for: HGBA1C         Assessment & Plan:

## 2017-10-22 NOTE — Assessment & Plan Note (Addendum)
Continue Reclast injections Encouraged daily weight bearing exercise Continue Calcium and Vit D

## 2017-10-22 NOTE — Assessment & Plan Note (Signed)
Controlled with prn medications

## 2017-10-29 ENCOUNTER — Telehealth: Payer: Self-pay | Admitting: Pharmacist

## 2017-10-29 NOTE — Telephone Encounter (Signed)
Oral Chemotherapy Pharmacist Encounter   Attempted to reach patient's daughter [name] for follow up on oral medication: Xtandi (enzalutamide). No answer. Left VM for [name] to call back.   Darl Pikes, PharmD, BCPS, [facility] Hematology/Oncology Clinical Pharmacist [facility] [phone]  10/29/2017 3:04 PM

## 2017-10-30 ENCOUNTER — Other Ambulatory Visit: Payer: Self-pay

## 2017-10-30 ENCOUNTER — Ambulatory Visit: Payer: Medicare Other | Admitting: Internal Medicine

## 2017-10-30 ENCOUNTER — Emergency Department: Payer: Medicare Other

## 2017-10-30 ENCOUNTER — Inpatient Hospital Stay
Admission: EM | Admit: 2017-10-30 | Discharge: 2017-11-02 | DRG: 392 | Disposition: A | Discharge status: Skilled Nursing Facility | Payer: Medicare Other | Attending: Internal Medicine | Admitting: Internal Medicine

## 2017-10-30 VITALS — BP 125/72 | HR 75 | Temp 98.1°F | Resp 16

## 2017-10-30 DIAGNOSIS — M159 Polyosteoarthritis, unspecified: Secondary | ICD-10-CM | POA: Diagnosis present

## 2017-10-30 DIAGNOSIS — C799 Secondary malignant neoplasm of unspecified site: Secondary | ICD-10-CM | POA: Diagnosis present

## 2017-10-30 DIAGNOSIS — Z7982 Long term (current) use of aspirin: Secondary | ICD-10-CM

## 2017-10-30 DIAGNOSIS — M810 Age-related osteoporosis without current pathological fracture: Secondary | ICD-10-CM | POA: Diagnosis present

## 2017-10-30 DIAGNOSIS — Z9841 Cataract extraction status, right eye: Secondary | ICD-10-CM

## 2017-10-30 DIAGNOSIS — I700 Atherosclerosis of aorta: Secondary | ICD-10-CM | POA: Diagnosis present

## 2017-10-30 DIAGNOSIS — E538 Deficiency of other specified B group vitamins: Secondary | ICD-10-CM | POA: Diagnosis present

## 2017-10-30 DIAGNOSIS — Z91018 Allergy to other foods: Secondary | ICD-10-CM

## 2017-10-30 DIAGNOSIS — K222 Esophageal obstruction: Secondary | ICD-10-CM | POA: Diagnosis not present

## 2017-10-30 DIAGNOSIS — Z8673 Personal history of transient ischemic attack (TIA), and cerebral infarction without residual deficits: Secondary | ICD-10-CM

## 2017-10-30 DIAGNOSIS — R195 Other fecal abnormalities: Secondary | ICD-10-CM | POA: Diagnosis not present

## 2017-10-30 DIAGNOSIS — R079 Chest pain, unspecified: Secondary | ICD-10-CM | POA: Diagnosis not present

## 2017-10-30 DIAGNOSIS — Z66 Do not resuscitate: Secondary | ICD-10-CM | POA: Diagnosis present

## 2017-10-30 DIAGNOSIS — Z85828 Personal history of other malignant neoplasm of skin: Secondary | ICD-10-CM

## 2017-10-30 DIAGNOSIS — T189XXA Foreign body of alimentary tract, part unspecified, initial encounter: Secondary | ICD-10-CM | POA: Diagnosis present

## 2017-10-30 DIAGNOSIS — Z833 Family history of diabetes mellitus: Secondary | ICD-10-CM

## 2017-10-30 DIAGNOSIS — R0789 Other chest pain: Secondary | ICD-10-CM | POA: Diagnosis not present

## 2017-10-30 DIAGNOSIS — Z7951 Long term (current) use of inhaled steroids: Secondary | ICD-10-CM

## 2017-10-30 DIAGNOSIS — C61 Malignant neoplasm of prostate: Secondary | ICD-10-CM | POA: Diagnosis present

## 2017-10-30 DIAGNOSIS — Z9079 Acquired absence of other genital organ(s): Secondary | ICD-10-CM

## 2017-10-30 DIAGNOSIS — R600 Localized edema: Secondary | ICD-10-CM

## 2017-10-30 DIAGNOSIS — Z9842 Cataract extraction status, left eye: Secondary | ICD-10-CM

## 2017-10-30 DIAGNOSIS — Z96653 Presence of artificial knee joint, bilateral: Secondary | ICD-10-CM | POA: Diagnosis present

## 2017-10-30 DIAGNOSIS — Z86010 Personal history of colonic polyps: Secondary | ICD-10-CM | POA: Diagnosis not present

## 2017-10-30 DIAGNOSIS — R1314 Dysphagia, pharyngoesophageal phase: Secondary | ICD-10-CM | POA: Diagnosis present

## 2017-10-30 DIAGNOSIS — I739 Peripheral vascular disease, unspecified: Secondary | ICD-10-CM | POA: Diagnosis present

## 2017-10-30 DIAGNOSIS — G629 Polyneuropathy, unspecified: Secondary | ICD-10-CM | POA: Diagnosis present

## 2017-10-30 DIAGNOSIS — Z806 Family history of leukemia: Secondary | ICD-10-CM

## 2017-10-30 DIAGNOSIS — M40203 Unspecified kyphosis, cervicothoracic region: Secondary | ICD-10-CM | POA: Diagnosis present

## 2017-10-30 LAB — CBC WITH DIFFERENTIAL/PLATELET
BASOS PCT: 1 %
Basophils Absolute: 0.1 10*3/uL (ref 0–0.1)
EOS ABS: 0.3 10*3/uL (ref 0–0.7)
Eosinophils Relative: 3 %
HCT: 35.3 % — ABNORMAL LOW (ref 40.0–52.0)
HEMOGLOBIN: 12 g/dL — AB (ref 13.0–18.0)
Lymphocytes Relative: 17 %
Lymphs Abs: 1.7 10*3/uL (ref 1.0–3.6)
MCH: 30.6 pg (ref 26.0–34.0)
MCHC: 34.1 g/dL (ref 32.0–36.0)
MCV: 89.8 fL (ref 80.0–100.0)
Monocytes Absolute: 0.8 10*3/uL (ref 0.2–1.0)
Monocytes Relative: 8 %
NEUTROS PCT: 71 %
Neutro Abs: 7 10*3/uL — ABNORMAL HIGH (ref 1.4–6.5)
Platelets: 145 10*3/uL — ABNORMAL LOW (ref 150–440)
RBC: 3.93 MIL/uL — AB (ref 4.40–5.90)
RDW: 13.4 % (ref 11.5–14.5)
WBC: 9.9 10*3/uL (ref 3.8–10.6)

## 2017-10-30 LAB — COMPREHENSIVE METABOLIC PANEL
ALBUMIN: 4 g/dL (ref 3.5–5.0)
ALK PHOS: 49 U/L (ref 38–126)
ALT: 12 U/L (ref 0–44)
ANION GAP: 8 (ref 5–15)
AST: 23 U/L (ref 15–41)
BUN: 24 mg/dL — ABNORMAL HIGH (ref 8–23)
CALCIUM: 8.5 mg/dL — AB (ref 8.9–10.3)
CHLORIDE: 99 mmol/L (ref 98–111)
CO2: 24 mmol/L (ref 22–32)
Creatinine, Ser: 0.9 mg/dL (ref 0.61–1.24)
GFR calc Af Amer: >60 mL/min (ref >60)
GFR calc non Af Amer: >60 mL/min (ref >60)
GLUCOSE: 148 mg/dL — AB (ref 70–99)
Potassium: 4.2 mmol/L (ref 3.5–5.1)
SODIUM: 131 mmol/L — AB (ref 135–145)
Total Bilirubin: 0.8 mg/dL (ref 0.3–1.2)
Total Protein: 7.2 g/dL (ref 6.5–8.1)

## 2017-10-30 LAB — LIPASE, BLOOD: Lipase: 21 U/L (ref 11–51)

## 2017-10-30 MED ORDER — GI COCKTAIL ~~LOC~~
30.0000 mL | Freq: Once | ORAL | Status: AC
  Administered 2017-10-30: 30 mL via ORAL
  Filled 2017-10-30: qty 30

## 2017-10-30 NOTE — ED Triage Notes (Signed)
Pt arrived via EMS c/o N/V and chest pain that has increased since this morning. Pt has received 4mg  of zofran, 2 nitro and 324mg  of aspirin PTA.

## 2017-10-30 NOTE — ED Notes (Signed)
Report from [place], rn.

## 2017-10-30 NOTE — ED Notes (Signed)
Pt c/o N/V associated with eating. Pt stated that he was able to eat dinner and then became nauseous.

## 2017-10-30 NOTE — Telephone Encounter (Signed)
Oral Chemotherapy Pharmacist Encounter  Follow-Up Form  Called patient's daughter Benjamin Villarreal today to follow up regarding patient's oral chemotherapy medication: Benjamin Villarreal (enzalutamide)  Original Start date of oral chemotherapy: 11/2016, pt recently restarted on 2 capsules daily, per his daughter, he is tolerating this well so far  Pt's daughter reported 0 tablets/doses of Xtandi missed in the last month.   Reports side effects: None reported  Recent labs reviewed: PSA from 10/14/17  New medications?: None reported  Other Issues: None reported  Patient knows to call the office with questions or concerns. Oral Oncology Clinic will continue to follow.  Benjamin Villarreal, PharmD, BCPS, [facility] Hematology/Oncology Clinical Pharmacist [facility] [phone]  10/30/2017 4:12 PM

## 2017-10-30 NOTE — ED Provider Notes (Signed)
[facility] Emergency Department Provider Note  ___________________________________________   First MD Initiated Contact with Patient 10/30/17 2131     (approximate)  I have reviewed the triage vital signs and the nursing notes.   HISTORY  Chief Complaint Chest Pain   HPI Rajinder Mesick is a 84 y.o. male with a history of osteoporosis as well as a brainstem stroke was presented to the emergency department with intermittent chest pain over the course of the day.  He says it worsens with eating.  Says it feels like a pressure type pain and is in the center of his chest.  He says that he took some Pepto-Bismol earlier and is now spitting up the Pepto-Bismol every several minutes.  He says that the pain comes in waves and lasts for about 5 minutes at a time.  Denies any radiation.  Denies any shortness of breath.  Says that he had a bowel movement this morning.  Denies any abdominal pain.  Denies any previous esophageal pathology and does not remember ever needed to have an esophageal dilatation.  Says that his pain is only mild at this time.   Past Medical History:  Diagnosis Date  . Aortic atherosclerosis ([place])    Noted on CT  . Arthritis   . Colon polyps    adenomatous  . History of fracture of tibia   . History of tachycardia    patient unaware of this  . Kyphosis of cervicothoracic region    SEVERE  . Osteoporosis 01/2014   T -3.8 (01/2014)  . Other constipation    chronic  . Prostate cancer [facility]) 1996   surgery then lupron  . Skin cancer, basal cell 2004   surgical resection.  Marland Kitchen Unspecified venous (peripheral) insufficiency   . Vitamin B12 deficiency     Patient Active Problem List   Diagnosis Date Noted  . Primary prostate cancer with metastasis from prostate to other site [facility]) 07/23/2017  . Stasis dermatitis 07/23/2017  . Aortic atherosclerosis ([place]) 10/09/2016  . Peripheral neuropathy 01/24/2016  . Brain stem stroke syndrome 12/22/2012    . Allergic rhinitis due to pollen 10/21/2012  . Prostate cancer ([place]) 03/22/2009  . Chronic venous insufficiency 09/07/2008  . CONSTIPATION, CHRONIC 09/07/2008  . Osteoarthritis, multiple sites 09/05/2008  . Osteoporosis 09/05/2008    Past Surgical History:  Procedure Laterality Date  . BASAL CELL CARCINOMA EXCISION  2004   Left side of face  . CATARACT EXTRACTION Bilateral 2000, 2003   both eyes  . COLONOSCOPY     h/o adenomatous polyps  . CYSTOSCOPY W/ RETROGRADES Bilateral 11/19/2016   Procedure: CYSTOSCOPY WITH RETROGRADE PYELOGRAM;  Surgeon: Hollice Espy, MD;  Location: [facility] ORS;  Service: Urology;  Laterality: Bilateral;  General vs. Spinal  . CYSTOSCOPY W/ URETERAL STENT PLACEMENT Left 02/25/2017   Procedure: CYSTOSCOPY WITH STENT REPLACEMENT;  Surgeon: Hollice Espy, MD;  Location: [facility] ORS;  Service: Urology;  Laterality: Left;  . CYSTOSCOPY WITH URETEROSCOPY AND STENT PLACEMENT Left 11/19/2016   Procedure: CYSTOSCOPY WITH URETEROSCOPY AND STENT PLACEMENT;  Surgeon: Hollice Espy, MD;  Location: [facility] ORS;  Service: Urology;  Laterality: Left;  . FRACTURE SURGERY    . JOINT REPLACEMENT  2005   Left knee  . JOINT REPLACEMENT  12/13   Right knee  . PROSTATE SURGERY  1996   cancer  . sigmoid resection    . TIBIA FRACTURE SURGERY  1999  . URETERAL BIOPSY Left 11/19/2016   Procedure: URETERAL BIOPSY;  Surgeon:  Hollice Espy, MD;  Location: [facility] ORS;  Service: Urology;  Laterality: Left;  Marland Kitchen VOLVULUS REDUCTION  12/10    Prior to Admission medications   Medication Sig Start Date End Date Taking? Authorizing Provider  acetaminophen (TYLENOL) 325 MG tablet Take 325-650 mg by mouth every 4 (four) hours as needed for fever (general discomfort).     [provider]  alum & mag hydroxide-simeth (MAALOX/MYLANTA) 200-200-20 MG/5ML suspension Take 30 mLs by mouth every 4 (four) hours as needed for indigestion or heartburn.    [provider]  aspirin EC 81 MG  tablet Take 81 mg by mouth every other day.    [provider]  Biotin 2.5 MG CAPS Take 2.5 mg by mouth daily.     [provider]  bismuth subsalicylate (KAOPECTATE) 262 MG/15ML suspension Take 10 mLs by mouth 3 (three) times daily as needed for diarrhea or loose stools.     [provider]  Calcium Carbonate-Vitamin D (CALCIUM-D) 600-400 MG-UNIT TABS Take 1 tablet by mouth daily.     [provider]  carbamide peroxide (DEBROX) 6.5 % OTIC solution 5 drops daily as needed.    [provider]  cholecalciferol (VITAMIN D) 1000 UNITS tablet Take 1,000 Units by mouth 2 (two) times daily.     [provider]  ciclopirox (LOPROX) 0.77 % cream Apply topically.    [provider]  clindamycin (CLEOCIN) 150 MG capsule clindamycin HCl 150 mg capsule    [provider]  cyclobenzaprine (FLEXERIL) 5 MG tablet Take 5 mg by mouth at bedtime as needed for muscle spasms.    [provider]  dextromethorphan-guaiFENesin (ROBITUSSIN-DM) 10-100 MG/5ML liquid Take 10 mLs by mouth every 4 (four) hours as needed for cough.    [provider]  diphenhydrAMINE (BENADRYL) 25 mg capsule Take 25 mg by mouth every 4 (four) hours as needed for itching or allergies.    [provider]  enzalutamide [name]) 40 MG capsule Take 2 capsules (80 mg total) by mouth daily. 10/14/17   Cammie Sickle, MD  fluPHENAZine decanoate (PROLIXIN) 25 MG/ML injection Inject into the muscle.    [provider]  fluticasone (FLONASE) 50 MCG/ACT nasal spray Place 1 spray into both nostrils daily.    [provider]  HYDROcodone-acetaminophen (NORCO/VICODIN) 5-325 MG tablet Take 1 tablet by mouth every 6 (six) hours as needed for moderate pain. Patient not taking: Reported on 10/14/2017 05/07/17   Bettey Costa, MD  ibuprofen (ADVIL,MOTRIN) 200 MG tablet Take 400 mg by mouth 2 (two) times daily as needed for headache or mild pain.      [provider]  latanoprost (XALATAN) 0.005 % ophthalmic solution Place 1 drop into both eyes at bedtime.    [provider]  loratadine (CLARITIN) 10 MG tablet Take 20 mg by mouth at bedtime.    [provider]  mupirocin ointment (BACTROBAN) 2 % Place 1 application into the nose 2 (two) times daily. Patient not taking: Reported on 10/22/2017 05/04/17   Nicholes Mango, MD  nystatin (NYSTATIN) powder Apply 1 g topically 2 (two) times daily as needed (rash). Use 1 application twice daily as needed for rash    [provider]  penicillin v potassium (VEETID) 500 MG tablet Take 500 mg by mouth 2 (two) times daily.    [provider]  polyethylene glycol powder (GLYCOLAX/MIRALAX) powder FILL TO LINE (17 GRAMS ), MIX AND DRINK TWICE A DAY AND 8.5 GRAMS AT [facility] 09/30/16  Venia Carbon, MD  sennosides-docusate sodium (SENOKOT-S) 8.6-50 MG tablet Take 4 tablets by mouth every evening.     [provider]  Skin Protectants, Misc. (EUCERIN) cream Apply 1 application topically daily.    [provider]  sodium chloride (OCEAN) 0.65 % SOLN nasal spray Place 1-2 sprays into both nostrils 2 (two) times daily as needed for congestion.    [provider]  traMADol (ULTRAM) 50 MG tablet Take 1 tablet (50 mg total) by mouth every 6 (six) hours as needed for moderate pain. Patient not taking: Reported on 10/22/2017 05/04/17   Nicholes Mango, MD  triamcinolone cream (KENALOG) 0.1 % Apply 1 application topically daily as needed.     [provider]  vitamin B-12 (CYANOCOBALAMIN) 1000 MCG tablet Take 1,000 mcg by mouth daily.    [provider]  vitamin C (ASCORBIC ACID) 500 MG tablet Take 500 mg by mouth daily.    [provider]    Allergies Other  Family History  Problem Relation Age of Onset  . Leukemia Mother   . Diabetes Son   . Prostate cancer Neg Hx   . Bladder Cancer Neg Hx   . Kidney cancer Neg Hx      Social History Social History   Tobacco Use  . Smoking status: Never Smoker  . Smokeless tobacco: Never Used  Substance Use Topics  . Alcohol use: No    Alcohol/week: 0.0 - 1.0 standard drinks    Frequency: Never    Comment: in past  . Drug use: No    Review of Systems  Constitutional: No fever/chills Eyes: No visual changes. ENT: No sore throat. Cardiovascular: As above Respiratory: Denies shortness of breath. Gastrointestinal: No abdominal pain.  No diarrhea.  No constipation. Genitourinary: Negative for dysuria. Musculoskeletal: Negative for back pain. Skin: Negative for rash. Neurological: Negative for headaches, focal weakness or numbness.   ____________________________________________   PHYSICAL EXAM:  VITAL SIGNS: ED Triage Vitals [10/30/17 2131]  Enc Vitals Group     BP      Pulse      Resp      Temp      Temp src      SpO2 95 %     Weight      Height      Head Circumference      Peak Flow      Pain Score      Pain Loc      Pain Edu?      Excl. in [place]?     Constitutional: Alert and oriented. Well appearing and in no acute distress. Eyes: Conjunctivae are normal.  Head: Atraumatic. Nose: No congestion/rhinnorhea. Mouth/Throat: Mucous membranes are moist.  Neck: No stridor.   Cardiovascular: Normal rate, regular rhythm. Grossly normal heart sounds.  Respiratory: Normal respiratory effort.  No retractions. Lungs CTAB. Gastrointestinal: Soft and nontender. No distention.  Musculoskeletal: No lower extremity tenderness nor edema.  No joint effusions. Neurologic:  Normal speech and language. No gross focal neurologic deficits are appreciated. Skin:  Skin is warm, dry and intact. No rash noted. Psychiatric: Mood and affect are normal. Speech and behavior are normal.  ____________________________________________   LABS (all labs ordered are listed, but only abnormal results are displayed)  Labs Reviewed  CBC WITH DIFFERENTIAL/PLATELET -  Abnormal; Notable for the following components:      Result Value   RBC 3.93 (*)    Hemoglobin 12.0 (*)    HCT 35.3 (*)  Platelets 145 (*)    Neutro Abs 7.0 (*)    All other components within normal limits  COMPREHENSIVE METABOLIC PANEL - Abnormal; Notable for the following components:   Sodium 131 (*)    Glucose, Bld 148 (*)    BUN 24 (*)    Calcium 8.5 (*)    All other components within normal limits  LIPASE, BLOOD  TROPONIN I   ____________________________________________  EKG  ED ECG REPORT I, Doran Stabler, the attending physician, personally viewed and interpreted this ECG.   Date: 10/30/2017  EKG Time: 2130  Rate: 66  Rhythm: normal sinus rhythm  Axis: Normal  Intervals:none  ST&T Change: No ST segment elevation or depression.  No abnormal T wave inversion.  ____________________________________________  RADIOLOGY  No acute finding on the chest x-ray ____________________________________________   PROCEDURES  Procedure(s) performed:   Procedures  Critical Care performed:   ____________________________________________   INITIAL IMPRESSION / ASSESSMENT AND PLAN / ED COURSE  Pertinent labs & imaging results that were available during my care of the patient were reviewed by me and considered in my medical decision making (see chart for details).  Differential diagnosis includes, but is not limited to, ACS, aortic dissection, pulmonary embolism, cardiac tamponade, pneumothorax, pneumonia, pericarditis, myocarditis, GI-related causes including esophagitis/gastritis, and musculoskeletal chest wall pain.   As part of my medical decision making, I reviewed the following data within the electronic MEDICAL RECORD NUMBER Notes from prior ED visits  ----------------------------------------- 12:29 AM on 10/31/2017 -----------------------------------------  Patient with reassuring cardiac work-up.  However, continues to spit into his emesis bag.  Try to can of  warm Coke which she was unable to tolerate.  We then tried glucagon without symptomatic improvement.  I discussed case Dr. Alice Reichert who will be coming in to evaluate the patient for an endoscopy.  I reviewed the risks of endoscopy including perforation and likely intubation for the procedure.  Patient is amenable and would like to talk further with the gastroenterologist.  The patient last ate lunch today when he had a quiche and ice cream.  Denies having any chunks of meat today such as steak, chicken or hot dogs.  Family at the bedside and also aware of the diagnosis and plan.  Suspecting either stricture versus food impaction. ____________________________________________   FINAL CLINICAL IMPRESSION(S) / ED DIAGNOSES  Esophageal obstruction.  NEW MEDICATIONS STARTED DURING THIS VISIT:  New Prescriptions   No medications on file     Note:  This document was prepared using Dragon voice recognition software and may include unintentional dictation errors.     Orbie Pyo, MD 10/31/17 Lupita Shutter

## 2017-10-30 NOTE — ED Notes (Signed)
Bed adjusted for comfort. Family updated again regarding wait for md disposition.

## 2017-10-31 ENCOUNTER — Observation Stay: Payer: Medicare Other

## 2017-10-31 ENCOUNTER — Encounter
Admission: EM | Disposition: A | Discharge status: Skilled Nursing Facility | Payer: Self-pay | Source: Home / Self Care | Attending: Internal Medicine

## 2017-10-31 ENCOUNTER — Other Ambulatory Visit: Payer: Self-pay

## 2017-10-31 ENCOUNTER — Emergency Department: Payer: Medicare Other | Admitting: Certified Registered"

## 2017-10-31 ENCOUNTER — Encounter: Payer: Self-pay | Admitting: Anesthesiology

## 2017-10-31 DIAGNOSIS — T189XXA Foreign body of alimentary tract, part unspecified, initial encounter: Secondary | ICD-10-CM | POA: Diagnosis not present

## 2017-10-31 DIAGNOSIS — K222 Esophageal obstruction: Secondary | ICD-10-CM | POA: Diagnosis not present

## 2017-10-31 DIAGNOSIS — C61 Malignant neoplasm of prostate: Secondary | ICD-10-CM | POA: Diagnosis not present

## 2017-10-31 DIAGNOSIS — J301 Allergic rhinitis due to pollen: Secondary | ICD-10-CM | POA: Diagnosis not present

## 2017-10-31 DIAGNOSIS — R079 Chest pain, unspecified: Secondary | ICD-10-CM | POA: Diagnosis not present

## 2017-10-31 DIAGNOSIS — Z8673 Personal history of transient ischemic attack (TIA), and cerebral infarction without residual deficits: Secondary | ICD-10-CM | POA: Diagnosis not present

## 2017-10-31 DIAGNOSIS — K208 Other esophagitis: Secondary | ICD-10-CM | POA: Diagnosis not present

## 2017-10-31 DIAGNOSIS — K228 Other specified diseases of esophagus: Secondary | ICD-10-CM | POA: Diagnosis not present

## 2017-10-31 DIAGNOSIS — R1310 Dysphagia, unspecified: Secondary | ICD-10-CM | POA: Diagnosis not present

## 2017-10-31 DIAGNOSIS — K209 Esophagitis, unspecified: Secondary | ICD-10-CM | POA: Diagnosis not present

## 2017-10-31 DIAGNOSIS — B3781 Candidal esophagitis: Secondary | ICD-10-CM | POA: Diagnosis not present

## 2017-10-31 DIAGNOSIS — R0789 Other chest pain: Secondary | ICD-10-CM | POA: Diagnosis not present

## 2017-10-31 DIAGNOSIS — G629 Polyneuropathy, unspecified: Secondary | ICD-10-CM | POA: Diagnosis not present

## 2017-10-31 DIAGNOSIS — J984 Other disorders of lung: Secondary | ICD-10-CM | POA: Diagnosis not present

## 2017-10-31 HISTORY — PX: ESOPHAGOGASTRODUODENOSCOPY: SHX5428

## 2017-10-31 LAB — CBC WITH DIFFERENTIAL/PLATELET
BASOS ABS: 0.1 10*3/uL (ref 0–0.1)
Basophils Relative: 1 %
EOS PCT: 4 %
Eosinophils Absolute: 0.3 10*3/uL (ref 0–0.7)
HEMATOCRIT: 34.5 % — AB (ref 40.0–52.0)
Hemoglobin: 11.8 g/dL — ABNORMAL LOW (ref 13.0–18.0)
LYMPHS PCT: 24 %
Lymphs Abs: 1.6 10*3/uL (ref 1.0–3.6)
MCH: 31.2 pg (ref 26.0–34.0)
MCHC: 34.3 g/dL (ref 32.0–36.0)
MCV: 90.8 fL (ref 80.0–100.0)
Monocytes Absolute: 0.8 10*3/uL (ref 0.2–1.0)
Monocytes Relative: 12 %
Neutro Abs: 3.8 10*3/uL (ref 1.4–6.5)
Neutrophils Relative %: 59 %
PLATELETS: 125 10*3/uL — AB (ref 150–440)
RBC: 3.8 MIL/uL — AB (ref 4.40–5.90)
RDW: 13.8 % (ref 11.5–14.5)
WBC: 6.5 10*3/uL (ref 3.8–10.6)

## 2017-10-31 LAB — TROPONIN I
Troponin I: <0.03 ng/mL (ref <0.03)
Troponin I: <0.03 ng/mL (ref <0.03)
Troponin I: <0.03 ng/mL (ref <0.03)
Troponin I: <0.03 ng/mL (ref <0.03)

## 2017-10-31 LAB — MRSA PCR SCREENING: MRSA by PCR: NEGATIVE

## 2017-10-31 LAB — TSH: TSH: 1.636 u[IU]/mL (ref 0.350–4.500)

## 2017-10-31 SURGERY — EGD (ESOPHAGOGASTRODUODENOSCOPY)
Anesthesia: General | Site: Esophagus | Wound class: Clean Contaminated

## 2017-10-31 MED ORDER — MORPHINE SULFATE (PF) 2 MG/ML IV SOLN: 1.0000 mg | INTRAVENOUS | Status: DC | PRN

## 2017-10-31 MED ORDER — ONDANSETRON HCL 4 MG/2ML IJ SOLN: 4.0000 mg | Freq: Once | INTRAMUSCULAR | Status: DC | PRN

## 2017-10-31 MED ORDER — ONDANSETRON HCL 4 MG/2ML IJ SOLN: 4.0000 mg | Freq: Four times a day (QID) | INTRAMUSCULAR | Status: DC | PRN

## 2017-10-31 MED ORDER — LACTATED RINGERS IV SOLN
INTRAVENOUS | Status: DC | PRN
  Administered 2017-10-31: 02:00:00 via INTRAVENOUS

## 2017-10-31 MED ORDER — FENTANYL CITRATE (PF) 100 MCG/2ML IJ SOLN: 25.0000 ug | INTRAMUSCULAR | Status: DC | PRN

## 2017-10-31 MED ORDER — FAMOTIDINE IN NACL 20-0.9 MG/50ML-% IV SOLN
20.0000 mg | Freq: Two times a day (BID) | INTRAVENOUS | Status: DC
  Administered 2017-10-31 (×2): 20 mg via INTRAVENOUS
  Filled 2017-10-31 (×2): qty 50

## 2017-10-31 MED ORDER — ONDANSETRON HCL 4 MG PO TABS: 4.0000 mg | ORAL_TABLET | Freq: Four times a day (QID) | ORAL | Status: DC | PRN

## 2017-10-31 MED ORDER — GLUCAGON HCL RDNA (DIAGNOSTIC) 1 MG IJ SOLR
INTRAMUSCULAR | Status: AC
  Filled 2017-10-31: qty 1

## 2017-10-31 MED ORDER — SUCCINYLCHOLINE CHLORIDE 20 MG/ML IJ SOLN
INTRAMUSCULAR | Status: AC
  Filled 2017-10-31: qty 1

## 2017-10-31 MED ORDER — SODIUM CHLORIDE 0.9 % IV SOLN
INTRAVENOUS | Status: DC
  Administered 2017-10-31 – 2017-11-01 (×2): via INTRAVENOUS

## 2017-10-31 MED ORDER — ACETAMINOPHEN 325 MG PO TABS
650.0000 mg | ORAL_TABLET | Freq: Four times a day (QID) | ORAL | Status: DC | PRN
  Administered 2017-11-01 (×2): 650 mg via ORAL
  Filled 2017-10-31 (×2): qty 2

## 2017-10-31 MED ORDER — ONDANSETRON HCL 4 MG/2ML IJ SOLN
INTRAMUSCULAR | Status: AC
  Filled 2017-10-31: qty 2

## 2017-10-31 MED ORDER — LIDOCAINE HCL (CARDIAC) PF 100 MG/5ML IV SOSY
PREFILLED_SYRINGE | INTRAVENOUS | Status: DC | PRN
  Administered 2017-10-31: 100 mg via INTRAVENOUS

## 2017-10-31 MED ORDER — IOHEXOL 300 MG/ML  SOLN
75.0000 mL | Freq: Once | INTRAMUSCULAR | Status: AC | PRN
  Administered 2017-10-31: 75 mL via INTRAVENOUS

## 2017-10-31 MED ORDER — PHENYLEPHRINE HCL 10 MG/ML IJ SOLN
INTRAMUSCULAR | Status: AC
  Filled 2017-10-31: qty 1

## 2017-10-31 MED ORDER — DOCUSATE SODIUM 100 MG PO CAPS
100.0000 mg | ORAL_CAPSULE | Freq: Two times a day (BID) | ORAL | Status: DC
  Administered 2017-11-01 (×2): 100 mg via ORAL
  Filled 2017-10-31 (×2): qty 1

## 2017-10-31 MED ORDER — ONDANSETRON HCL 4 MG/2ML IJ SOLN
INTRAMUSCULAR | Status: DC | PRN
  Administered 2017-10-31: 4 mg via INTRAVENOUS

## 2017-10-31 MED ORDER — PROPOFOL 10 MG/ML IV BOLUS
INTRAVENOUS | Status: AC
  Filled 2017-10-31: qty 40

## 2017-10-31 MED ORDER — GLUCAGON HCL RDNA (DIAGNOSTIC) 1 MG IJ SOLR
1.0000 mg | Freq: Once | INTRAMUSCULAR | Status: AC
  Administered 2017-10-31: 1 mg via INTRAVENOUS

## 2017-10-31 MED ORDER — LIDOCAINE HCL (PF) 2 % IJ SOLN
INTRAMUSCULAR | Status: AC
  Filled 2017-10-31: qty 10

## 2017-10-31 MED ORDER — PROPOFOL 10 MG/ML IV BOLUS
INTRAVENOUS | Status: DC | PRN
  Administered 2017-10-31: 100 mg via INTRAVENOUS

## 2017-10-31 MED ORDER — SUCCINYLCHOLINE CHLORIDE 20 MG/ML IJ SOLN
INTRAMUSCULAR | Status: DC | PRN
  Administered 2017-10-31: 100 mg via INTRAVENOUS

## 2017-10-31 MED ORDER — ACETAMINOPHEN 650 MG RE SUPP: 650.0000 mg | Freq: Four times a day (QID) | RECTAL | Status: DC | PRN

## 2017-10-31 NOTE — Anesthesia Procedure Notes (Signed)
Procedure Name: Intubation Date/Time: 10/31/2017 2:20 AM Performed by: Chanetta [name], CRNA Pre-anesthesia Checklist: Patient identified, Emergency Drugs available, Suction available and Patient being monitored Patient Re-evaluated:Patient Re-evaluated prior to induction Oxygen Delivery Method: Circle system utilized Preoxygenation: Pre-oxygenation with 100% oxygen Induction Type: IV induction Ventilation: Mask ventilation without difficulty Laryngoscope Size: McGraph and 4 Grade View: Grade II Tube type: Oral Tube size: 7.5 mm Number of attempts: 1 Airway Equipment and Method: Stylet Placement Confirmation: ETT inserted through vocal cords under direct vision,  positive ETCO2,  CO2 detector and breath sounds checked- equal and bilateral Secured at: 22 cm Tube secured with: Tape

## 2017-10-31 NOTE — Care Management Note (Signed)
Case Management Note  Patient Details  Name: Benjamin Villarreal MRN: [id number] Date of Birth: 1920/11/27  Subjective/Objective:    Patient admitted to [facility] under observation status for esophageal stricture. RNCM consulted on patient to provide MOON letter and complete assessment. Spoke primarily with patients daughter Benjamin Villarreal [phone] to complete consult. Patient currently at [facility]. Patient uses a walker and is able to complete activities of daily living. Facility provides transport. Unsure about need for home health or any services at this time. Per their request a case manager will see them close to discharge and set up what is needed. Benjamin Pew [name] RN BSN RNCM [phone]                  Action/Plan:   Expected Discharge Date:                  Expected Discharge Plan:     In-House Referral:     Discharge planning Services     Post Acute Care Choice:    Choice offered to:     DME Arranged:    DME Agency:     HH Arranged:    HH Agency:     Status of Service:     If discussed at [place] of Stay Meetings, dates discussed:    Additional Comments:  Benjamin Ghrist A Aneth Schlagel, RN 10/31/2017, 1:21 PM

## 2017-10-31 NOTE — Transfer of Care (Signed)
Immediate Anesthesia Transfer of Care Note  Patient: Benjamin Villarreal  Procedure(s) Performed: ESOPHAGOGASTRODUODENOSCOPY (EGD) (N/A )  Patient Location: PACU  Anesthesia Type:General  Level of Consciousness: awake and alert   Airway & Oxygen Therapy: Patient Spontanous Breathing and Patient connected to face mask oxygen  Post-op Assessment: Report given to RN and Post -op Vital signs reviewed and stable  Post vital signs: Reviewed and stable  Last Vitals:  Vitals Value Taken Time  BP    Temp    Pulse    Resp    SpO2      Last Pain:  Vitals:   10/30/17 2136  PainSc: 5          Complications: No apparent anesthesia complications

## 2017-10-31 NOTE — Anesthesia Post-op Follow-up Note (Signed)
Anesthesia QCDR form completed.        

## 2017-10-31 NOTE — Anesthesia Postprocedure Evaluation (Signed)
Anesthesia Post Note  Patient: Benjamin Villarreal  Procedure(s) Performed: ESOPHAGOGASTRODUODENOSCOPY (EGD) with biopsy and brushings (N/A Esophagus)  Patient location during evaluation: PACU Anesthesia Type: General Level of consciousness: awake and alert Pain management: pain level controlled Vital Signs Assessment: post-procedure vital signs reviewed and stable Respiratory status: spontaneous breathing, nonlabored ventilation, respiratory function stable and patient connected to nasal cannula oxygen Cardiovascular status: blood pressure returned to baseline and stable Postop Assessment: no apparent nausea or vomiting Anesthetic complications: no     Last Vitals:  Vitals:   10/31/17 0335 10/31/17 0340  BP:  140/63  Pulse: 65 68  Resp: 15 13  Temp: 36.7 C   SpO2: 95% 94%    Last Pain:  Vitals:   10/31/17 0315  PainSc: 0-No pain                 Martha Clan

## 2017-10-31 NOTE — ED Notes (Signed)
Pt prepped for endoscopy. Watch, t shirt and underwear given to family. Pt is wearing glasses and hearing aid x1.

## 2017-10-31 NOTE — Consult Note (Addendum)
[facility] GI Inpatient Consult Note   Kathline Magic, M.D.  Reason for Consult: Dysphagia, esophageal obstruction   Attending Requesting Consult: Orbie Pyo, M.D.   History of Present Illness: Benjamin Villarreal is a 84 y.o. male presenting to the emergency room earlier in the morning with chest pain.  It was noted patient also had some complaints of dysphagia to solids and liquids.  Patient was given a GI cocktail with some chips subjective improvement in pain but the patient was still unable to swallow oral secretions or small amount of soda given in the emergency room.  Chest x-ray was unremarkable.  Patient takes no blood thinners.  GI has been asked to see the patient in regards to performing endoscopy for presumed esophageal obstruction with persistent dysphagia.  Glucagon 1 mg was reportedly given without any improvement in patient's swallowing.  Past Medical History:  Past Medical History:  Diagnosis Date  . Aortic atherosclerosis ([place])    Noted on CT  . Arthritis   . Colon polyps    adenomatous  . History of fracture of tibia   . History of tachycardia    patient unaware of this  . Kyphosis of cervicothoracic region    SEVERE  . Osteoporosis 01/2014   T -3.8 (01/2014)  . Other constipation    chronic  . Prostate cancer [facility]) 1996   surgery then lupron  . Skin cancer, basal cell 2004   surgical resection.  Marland Kitchen Unspecified venous (peripheral) insufficiency   . Vitamin B12 deficiency     Problem List: Patient Active Problem List   Diagnosis Date Noted  . Primary prostate cancer with metastasis from prostate to other site [facility]) 07/23/2017  . Stasis dermatitis 07/23/2017  . Aortic atherosclerosis ([place]) 10/09/2016  . Peripheral neuropathy 01/24/2016  . 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 Allergic rhinitis due to pollen 10/21/2012  . Prostate cancer ([place]) 03/22/2009  . Chronic venous insufficiency 09/07/2008  . CONSTIPATION, CHRONIC 09/07/2008   . Osteoarthritis, multiple sites 09/05/2008  . Osteoporosis 09/05/2008    Past Surgical History: Past Surgical History:  Procedure Laterality Date  . BASAL CELL CARCINOMA EXCISION  2004   Left side of face  . CATARACT EXTRACTION Bilateral 2000, 2003   both eyes  . COLONOSCOPY     h/o adenomatous polyps  . CYSTOSCOPY W/ RETROGRADES Bilateral 11/19/2016   Procedure: CYSTOSCOPY WITH RETROGRADE PYELOGRAM;  Surgeon: Hollice Espy, MD;  Location: [facility] ORS;  Service: Urology;  Laterality: Bilateral;  General vs. Spinal  . CYSTOSCOPY W/ URETERAL STENT PLACEMENT Left 02/25/2017   Procedure: CYSTOSCOPY WITH STENT REPLACEMENT;  Surgeon: Hollice Espy, MD;  Location: [facility] ORS;  Service: Urology;  Laterality: Left;  . CYSTOSCOPY WITH URETEROSCOPY AND STENT PLACEMENT Left 11/19/2016   Procedure: CYSTOSCOPY WITH URETEROSCOPY AND STENT PLACEMENT;  Surgeon: Hollice Espy, MD;  Location: [facility] ORS;  Service: Urology;  Laterality: Left;  . FRACTURE SURGERY    . JOINT REPLACEMENT  2005   Left knee  . JOINT REPLACEMENT  12/13   Right knee  . PROSTATE SURGERY  1996   cancer  . sigmoid resection    . TIBIA FRACTURE SURGERY  1999  . URETERAL BIOPSY Left 11/19/2016   Procedure: URETERAL BIOPSY;  Surgeon: Hollice Espy, MD;  Location: [facility] ORS;  Service: Urology;  Laterality: Left;  Marland Kitchen VOLVULUS REDUCTION  12/10    Allergies: Allergies  Allergen Reactions  . Other     RAW ONIONS- SINUS REACTION    Home Medications:  (  Not in a hospital admission) Home medication reconciliation was completed with the patient.   Scheduled Inpatient Medications:    Continuous Inpatient Infusions:    PRN Inpatient Medications:    Family History: family history includes Diabetes in his son; Leukemia in his mother.   GI Family History: Negative  Social History:   reports that he has never smoked. He has never used smokeless tobacco. He reports that he does not drink alcohol or use drugs. The patient denies  ETOH, tobacco, or drug use.    Review of Systems: Review of Systems - Negative except As above in the history of present illness  Physical Examination: BP (!) 187/81   Pulse 61   Resp 14   Ht 6\' 2"  (1.88 m)   Wt 83.6 kg   SpO2 98%   BMI 23.66 kg/m  Physical Exam  Constitutional: He appears well-developed.  Non-toxic appearance. He does not appear ill.  HENT:  Head: Normocephalic and atraumatic.  Eyes: Pupils are equal, round, and reactive to light.  Neck: Normal range of motion.  Cardiovascular: Normal rate, regular rhythm and normal pulses. Exam reveals no gallop, no S3 and no S4.  Pulmonary/Chest: He has no decreased breath sounds.  Abdominal: Soft. Bowel sounds are normal. He exhibits no distension. There is no tenderness.  Musculoskeletal: Normal range of motion.       Right lower leg: Normal.       Left lower leg: Normal.    Data: Lab Results  Component Value Date   WBC 9.9 10/30/2017   HGB 12.0 (L) 10/30/2017   HCT 35.3 (L) 10/30/2017   MCV 89.8 10/30/2017   PLT 145 (L) 10/30/2017   Recent Labs  Lab 10/30/17 2211  HGB 12.0*   Lab Results  Component Value Date   NA 131 (L) 10/30/2017   K 4.2 10/30/2017   CL 99 10/30/2017   CO2 24 10/30/2017   BUN 24 (H) 10/30/2017   CREATININE 0.90 10/30/2017   GLU 114 06/30/2016   Lab Results  Component Value Date   ALT 12 10/30/2017   AST 23 10/30/2017   ALKPHOS 49 10/30/2017   BILITOT 0.8 10/30/2017   No results for input(s): APTT, INR, PTT in the last 168 hours. CBC Latest Ref Rng & Units 10/30/2017 09/24/2017 09/16/2017  WBC 3.8 - 10.6 K/uL 9.9 7.1 6.8  Hemoglobin 13.0 - 18.0 g/dL 12.0(L) 12.8(L) 12.7(L)  Hematocrit 40.0 - 52.0 % 35.3(L) 36.3(L) 37.2(L)  Platelets 150 - 440 K/uL 145(L) 145(L) 163    STUDIES: Dg Abdomen Acute W/chest  Result Date: 10/30/2017 CLINICAL DATA:  Chest pain worsened by eating EXAM: DG ABDOMEN ACUTE W/ 1V CHEST COMPARISON:  CT 09/21/2017, chest radiograph 06/04/2015 FINDINGS:  Single-view chest demonstrates chronic interstitial changes. No focal opacity or pleural effusion. Normal cardiomediastinal silhouette with aortic atherosclerosis. No pneumothorax. Supine and upright views of the abdomen demonstrate no free air beneath the diaphragm. Overall nonobstructed gas pattern with mild gas-filled colon. Multiple surgical clips in the low pelvis. IMPRESSION: 1. No radiographic evidence for acute cardiopulmonary abnormality. Chronic interstitial disease 2. Overall nonobstructed gas pattern Electronically Signed   By: Donavan Foil M.D.   On: 10/30/2017 22:16   @IMAGES @  Assessment: 1.  Dysphagia-likely secondary to #2. 2.  Esophageal obstruction-likely related to ingested food versus esophageal stricture related to acid reflux or malignancy. 3.  Recurrent prostate cancer recently on chemotherapy.  Recommendations: EGD with foreign body removal and possible biopsy.The patient's daughter and power of attorney, [name],  understands the nature of the planned procedure, indications, risks, alternatives and potential complications including but not limited to bleeding, infection, perforation, damage to internal organs and possible oversedation/side effects from anesthesia. The patient's daughter agrees and gives consent to proceed.  Please refer to procedure notes for findings, recommendations and patient disposition/instructions.  Thank you for the consult. Please call with questions or concerns.  Olean Ree, "Lanny Hurst MD [facility] [place], [place] [address] [phone]  10/31/2017 2:02 AM

## 2017-10-31 NOTE — Progress Notes (Signed)
Patient admitted after midnight for dysphasia had a EGD which showed necrosis in his esophagus as well as edema and erythema and bleeding, patient seen and examined plan discussed with the patient and daughter Continue supportive care GI will follow the patient

## 2017-10-31 NOTE — H&P (Signed)
Benjamin Villarreal is an 84 y.o. male.   Chief Complaint: Chest pain HPI: The patient with past medical history of metastatic prostate cancer presents to the emergency department with chest pain.  The pain began at rest this morning and has waxed and waned throughout the day.  The patient describes the pain as pressure in quality and it is substernal to mid epigastric.  He thought it may be heartburn and took Pepto-Bismol which he promptly vomited.  He admits to gradually worsening difficulty swallowing as well.  The emergency department staff discussed his symptoms with gastroenterology and the patient underwent urgent EGD which showed stricture and possible necrosis of the esophagus which prompted the gastroenterology service to call the hospitalist group for admission.  Past Medical History:  Diagnosis Date  . Aortic atherosclerosis ([place])    Noted on CT  . Arthritis   . Colon polyps    adenomatous  . History of fracture of tibia   . History of tachycardia    patient unaware of this  . Kyphosis of cervicothoracic region    SEVERE  . Osteoporosis 01/2014   T -3.8 (01/2014)  . Other constipation    chronic  . Prostate cancer [facility]) 1996   surgery then lupron  . Skin cancer, basal cell 2004   surgical resection.  Marland Kitchen Unspecified venous (peripheral) insufficiency   . Vitamin B12 deficiency     Past Surgical History:  Procedure Laterality Date  . BASAL CELL CARCINOMA EXCISION  2004   Left side of face  . CATARACT EXTRACTION Bilateral 2000, 2003   both eyes  . COLONOSCOPY     h/o adenomatous polyps  . CYSTOSCOPY W/ RETROGRADES Bilateral 11/19/2016   Procedure: CYSTOSCOPY WITH RETROGRADE PYELOGRAM;  Surgeon: Hollice Espy, MD;  Location: [facility] ORS;  Service: Urology;  Laterality: Bilateral;  General vs. Spinal  . CYSTOSCOPY W/ URETERAL STENT PLACEMENT Left 02/25/2017   Procedure: CYSTOSCOPY WITH STENT REPLACEMENT;  Surgeon: Hollice Espy, MD;  Location: [facility] ORS;  Service: Urology;   Laterality: Left;  . CYSTOSCOPY WITH URETEROSCOPY AND STENT PLACEMENT Left 11/19/2016   Procedure: CYSTOSCOPY WITH URETEROSCOPY AND STENT PLACEMENT;  Surgeon: Hollice Espy, MD;  Location: [facility] ORS;  Service: Urology;  Laterality: Left;  . FRACTURE SURGERY    . JOINT REPLACEMENT  2005   Left knee  . JOINT REPLACEMENT  12/13   Right knee  . PROSTATE SURGERY  1996   cancer  . sigmoid resection    . TIBIA FRACTURE SURGERY  1999  . URETERAL BIOPSY Left 11/19/2016   Procedure: URETERAL BIOPSY;  Surgeon: Hollice Espy, MD;  Location: [facility] ORS;  Service: Urology;  Laterality: Left;  Marland Kitchen VOLVULUS REDUCTION  12/10    Family History  Problem Relation Age of Onset  . Leukemia Mother   . Diabetes Son   . Prostate cancer Neg Hx   . Bladder Cancer Neg Hx   . Kidney cancer Neg Hx    Social History:  reports that he has never smoked. He has never used smokeless tobacco. He reports that he does not drink alcohol or use drugs.  Allergies:  Allergies  Allergen Reactions  . Other     RAW ONIONS- SINUS REACTION    Medications Prior to Admission  Medication Sig Dispense Refill  . acetaminophen (TYLENOL) 325 MG tablet Take 325-650 mg by mouth every 4 (four) hours as needed for fever (general discomfort).     Marland Kitchen alum & mag hydroxide-simeth (MAALOX/MYLANTA) 200-200-20 MG/5ML suspension Take 30 mLs  by mouth every 4 (four) hours as needed for indigestion or heartburn.    Marland Kitchen aspirin EC 81 MG tablet Take 81 mg by mouth every other day.    . Biotin 2.5 MG CAPS Take 2.5 mg by mouth daily.     Marland Kitchen bismuth subsalicylate (KAOPECTATE) 262 MG/15ML suspension Take 10 mLs by mouth 3 (three) times daily as needed for diarrhea or loose stools.     . Calcium Carbonate-Vitamin D (CALCIUM-D) 600-400 MG-UNIT TABS Take 1 tablet by mouth daily.     . carbamide peroxide (DEBROX) 6.5 % OTIC solution 5 drops daily as needed.    . cholecalciferol (VITAMIN D) 1000 UNITS tablet Take 1,000 Units by mouth 2 (two) times daily.      . ciclopirox (LOPROX) 0.77 % cream Apply topically.    . clindamycin (CLEOCIN) 150 MG capsule clindamycin HCl 150 mg capsule    . cyclobenzaprine (FLEXERIL) 5 MG tablet Take 5 mg by mouth at bedtime as needed for muscle spasms.    Marland Kitchen dextromethorphan-guaiFENesin (ROBITUSSIN-DM) 10-100 MG/5ML liquid Take 10 mLs by mouth every 4 (four) hours as needed for cough.    . diphenhydrAMINE (BENADRYL) 25 mg capsule Take 25 mg by mouth every 4 (four) hours as needed for itching or allergies.    . enzalutamide (XTANDI) 40 MG capsule Take 2 capsules (80 mg total) by mouth daily. 60 capsule 4  . fluPHENAZine decanoate (PROLIXIN) 25 MG/ML injection Inject into the muscle.    . fluticasone (FLONASE) 50 MCG/ACT nasal spray Place 1 spray into both nostrils daily.    Marland Kitchen HYDROcodone-acetaminophen (NORCO/VICODIN) 5-325 MG tablet Take 1 tablet by mouth every 6 (six) hours as needed for moderate pain. (Patient not taking: Reported on 10/14/2017) 30 tablet 0  . ibuprofen (ADVIL,MOTRIN) 200 MG tablet Take 400 mg by mouth 2 (two) times daily as needed for headache or mild pain.     Marland Kitchen latanoprost (XALATAN) 0.005 % ophthalmic solution Place 1 drop into both eyes at bedtime.    Marland Kitchen loratadine (CLARITIN) 10 MG tablet Take 20 mg by mouth at bedtime.    . mupirocin ointment (BACTROBAN) 2 % Place 1 application into the nose 2 (two) times daily. (Patient not taking: Reported on 10/22/2017) 22 g 0  . nystatin (NYSTATIN) powder Apply 1 g topically 2 (two) times daily as needed (rash). Use 1 application twice daily as needed for rash    . penicillin v potassium (VEETID) 500 MG tablet Take 500 mg by mouth 2 (two) times daily.    . polyethylene glycol powder (GLYCOLAX/MIRALAX) powder FILL TO LINE (17 GRAMS ), MIX AND DRINK TWICE A DAY AND 8.5 GRAMS AT NOON 510 g 3  . sennosides-docusate sodium (SENOKOT-S) 8.6-50 MG tablet Take 4 tablets by mouth every evening.     . Skin Protectants, Misc. (EUCERIN) cream Apply 1 application topically daily.     . sodium chloride (OCEAN) 0.65 % SOLN nasal spray Place 1-2 sprays into both nostrils 2 (two) times daily as needed for congestion.    . traMADol (ULTRAM) 50 MG tablet Take 1 tablet (50 mg total) by mouth every 6 (six) hours as needed for moderate pain. (Patient not taking: Reported on 10/22/2017) 30 tablet 0  . triamcinolone cream (KENALOG) 0.1 % Apply 1 application topically daily as needed.     . vitamin B-12 (CYANOCOBALAMIN) 1000 MCG tablet Take 1,000 mcg by mouth daily.    . vitamin C (ASCORBIC ACID) 500 MG tablet Take 500 mg by mouth daily.  Results for orders placed or performed during the hospital encounter of 10/30/17 (from the past 48 hour(s))  CBC with Differential     Status: Abnormal   Collection Time: 10/30/17 10:11 PM  Result Value Ref Range   WBC 9.9 3.8 - 10.6 K/uL   RBC 3.93 (L) 4.40 - 5.90 MIL/uL   Hemoglobin 12.0 (L) 13.0 - 18.0 g/dL   HCT 35.3 (L) 40.0 - 52.0 %   MCV 89.8 80.0 - 100.0 fL   MCH 30.6 26.0 - 34.0 pg   MCHC 34.1 32.0 - 36.0 g/dL   RDW 13.4 11.5 - 14.5 %   Platelets 145 (L) 150 - 440 K/uL   Neutrophils Relative % 71 %   Neutro Abs 7.0 (H) 1.4 - 6.5 K/uL   Lymphocytes Relative 17 %   Lymphs Abs 1.7 1.0 - 3.6 K/uL   Monocytes Relative 8 %   Monocytes Absolute 0.8 0.2 - 1.0 K/uL   Eosinophils Relative 3 %   Eosinophils Absolute 0.3 0 - 0.7 K/uL   Basophils Relative 1 %   Basophils Absolute 0.1 0 - 0.1 K/uL    Comment: Performed at [facility], [address] [place], [place] [address]  Comprehensive metabolic panel     Status: Abnormal   Collection Time: 10/30/17 10:11 PM  Result Value Ref Range   Sodium 131 (L) 135 - 145 mmol/L   Potassium 4.2 3.5 - 5.1 mmol/L   Chloride 99 98 - 111 mmol/L   CO2 24 22 - 32 mmol/L   Glucose, Bld 148 (H) 70 - 99 mg/dL   BUN 24 (H) 8 - 23 mg/dL   Creatinine, Ser 0.90 0.61 - 1.24 mg/dL   Calcium 8.5 (L) 8.9 - 10.3 mg/dL   Total Protein 7.2 6.5 - 8.1 g/dL   Albumin 4.0 3.5 - 5.0 g/dL   AST 23 15  - 41 U/L   ALT 12 0 - 44 U/L   Alkaline Phosphatase 49 38 - 126 U/L   Total Bilirubin 0.8 0.3 - 1.2 mg/dL   GFR calc non Af Amer >60 >60 mL/min   GFR calc Af Amer >60 >60 mL/min    Comment: (NOTE) The eGFR has been calculated using the CKD EPI equation. This calculation has not been validated in all clinical situations. eGFR's persistently <60 mL/min signify possible Chronic Kidney Disease.    Anion gap 8 5 - 15    Comment: Performed at [facility], [address] [place], [place] [address]  Lipase, blood     Status: None   Collection Time: 10/30/17 10:11 PM  Result Value Ref Range   Lipase 21 11 - 51 U/L    Comment: Performed at [facility], [address] [place], [place] [address]  Troponin I     Status: None   Collection Time: 10/30/17 10:11 PM  Result Value Ref Range   Troponin I <0.03 <0.03 ng/mL    Comment: Performed at [facility], [address] [place], [place] [address]   Dg Abdomen Acute W/chest  Result Date: 10/30/2017 CLINICAL DATA:  Chest pain worsened by eating EXAM: DG ABDOMEN ACUTE W/ 1V CHEST COMPARISON:  CT 09/21/2017, chest radiograph 06/04/2015 FINDINGS: Single-view chest demonstrates chronic interstitial changes. No focal opacity or pleural effusion. Normal cardiomediastinal silhouette with aortic atherosclerosis. No pneumothorax. Supine and upright views of the abdomen demonstrate no free air beneath the diaphragm. Overall nonobstructed gas pattern with mild gas-filled colon. Multiple surgical clips in the low  pelvis. IMPRESSION: 1. No radiographic evidence for acute cardiopulmonary abnormality. Chronic interstitial disease 2. Overall nonobstructed gas pattern Electronically Signed   By: Donavan Foil M.D.   On: 10/30/2017 22:16    Review of Systems  Constitutional: Negative for chills and fever.  HENT: Negative for sore throat and tinnitus.   Eyes: Negative for blurred vision and redness.  Respiratory: Negative for cough  and shortness of breath.   Cardiovascular: Positive for chest pain. Negative for palpitations, orthopnea and PND.  Gastrointestinal: Negative for abdominal pain, diarrhea, nausea and vomiting.  Genitourinary: Negative for dysuria, frequency and urgency.  Musculoskeletal: Negative for joint pain and myalgias.  Skin: Negative for rash.       No lesions  Neurological: Negative for speech change, focal weakness and weakness.  Endo/Heme/Allergies: Does not bruise/bleed easily.       No temperature intolerance  Psychiatric/Behavioral: Negative for depression and suicidal ideas.    Blood pressure 134/72, pulse 65, temperature 98.4 F (36.9 C), temperature source Oral, resp. rate 18, height '6\' 2"'$  (1.88 m), weight 83.6 kg, SpO2 95 %. Physical Exam  Vitals reviewed. Constitutional: He is oriented to person, place, and time. He appears well-developed and well-nourished. No distress.  HENT:  Head: Normocephalic and atraumatic.  Mouth/Throat: Oropharynx is clear and moist.  Eyes: Pupils are equal, round, and reactive to light. Conjunctivae and EOM are normal. No scleral icterus.  Neck: Normal range of motion. Neck supple. No JVD present. No tracheal deviation present. No thyromegaly present.  Cardiovascular: Normal rate, regular rhythm and normal heart sounds. Exam reveals no gallop and no friction rub.  No murmur heard. Respiratory: Effort normal and breath sounds normal. No respiratory distress.  GI: Soft. Bowel sounds are normal. He exhibits no distension. There is no tenderness.  Genitourinary:  Genitourinary Comments: Deferred  Musculoskeletal: Normal range of motion. He exhibits no edema.  Lymphadenopathy:    He has no cervical adenopathy.  Neurological: He is alert and oriented to person, place, and time. No cranial nerve deficit.  Skin: Skin is warm and dry. No rash noted. No erythema.  Psychiatric: He has a normal mood and affect. His behavior is normal. Judgment and thought content  normal.     Assessment/Plan This is a 84 year old male admitted for chest pain. 1.  Chest pain: No indication of myocardial ischemia.  Continue to follow cardiac biomarkers.  Monitor telemetry.  Cardiology consultation at discretion of the primary team. 2.  Odynophagia: The patient is undergone emergent EGD which showed esophageal stricture and likely esophageal necrosis.  Will obtain CT of the chest with contrast in the morning.  The patient is not in any pain at this time. 3.  Prostate cancer: Metastatic; status post radical prostatectomy.  Consult oncology and/or palliative care when more information is obtained. 4.  DVT prophylaxis: SCDs 5.  GI prophylaxis: None The patient is a DNR.  Time spent on admission orders and patient care approximately 45 minutes  Harrie Foreman, MD 10/31/2017, 5:41 AM

## 2017-10-31 NOTE — Care Management Obs Status (Signed)
[place] NOTIFICATION   Patient Details  Name: Benjamin Villarreal MRN: [id number] Date of Birth: 1920/11/22   Medicare Observation Status Notification Given:  Yes    [name] [name], RN 10/31/2017, 1:21 PM

## 2017-10-31 NOTE — Anesthesia Preprocedure Evaluation (Signed)
Anesthesia Evaluation  Patient identified by MRN, date of birth, ID band Patient awake    Reviewed: Allergy & Precautions, H&P , NPO status , Patient's Chart, lab work & pertinent test results  History of Anesthesia Complications Negative for: history of anesthetic complications  Airway Mallampati: III  TM Distance: >3 FB Neck ROM: limited    Dental  (+) Poor Dentition, Chipped, Missing, Caps, Implants, Dental Advidsory Given   Pulmonary neg pulmonary ROS, neg shortness of breath,           Cardiovascular Exercise Tolerance: Good (-) angina+ Peripheral Vascular Disease  (-) Past MI + dysrhythmias      Neuro/Psych  Neuromuscular disease CVA negative psych ROS   GI/Hepatic negative GI ROS, Neg liver ROS,   Endo/Other  negative endocrine ROS  Renal/GU Renal disease     Musculoskeletal  (+) Arthritis ,   Abdominal   Peds  Hematology negative hematology ROS (+)   Anesthesia Other Findings Past Medical History: No date: Aortic atherosclerosis ([facility])     Comment:  Noted on CT No date: Arthritis No date: Colon polyps     Comment:  adenomatous No date: Dysrhythmia     Comment:  pt unaware of this No date: History of fracture of tibia No date: History of tachycardia     Comment:  patient unaware of this No date: Kyphosis of cervicothoracic region     Comment:  SEVERE 01/2014: Osteoporosis     Comment:  T -3.8 (01/2014) No date: Other constipation     Comment:  chronic No date: Prostate cancer ([place])     Comment:  surgery then lupron No date: Unspecified venous (peripheral) insufficiency No date: Vitamin B12 deficiency  Past Surgical History: 2004: BASAL CELL CARCINOMA EXCISION     Comment:  Left side of face 2000, 2003: CATARACT EXTRACTION; Bilateral     Comment:  both eyes No date: COLONOSCOPY     Comment:  h/o adenomatous polyps 2005: JOINT REPLACEMENT     Comment:  Left knee 12/13: JOINT REPLACEMENT   Comment:  Right knee 1996: PROSTATE SURGERY     Comment:  cancer No date: sigmoid resection 1999: TIBIA FRACTURE SURGERY 12/10: VOLVULUS REDUCTION  BMI    Body Mass Index:  22.57 kg/m      Reproductive/Obstetrics negative OB ROS                             Anesthesia Physical  Anesthesia Plan  ASA: III and emergent  Anesthesia Plan: General ETT   Post-op Pain Management:    Induction: Intravenous, Rapid sequence and Cricoid pressure planned  PONV Risk Score and Plan: 3 and Ondansetron, Dexamethasone and Treatment may vary due to age or medical condition  Airway Management Planned: Oral ETT  Additional Equipment:   Intra-op Plan:   Post-operative Plan: Extubation in OR  Informed Consent: I have reviewed the patients History and Physical, chart, labs and discussed the procedure including the risks, benefits and alternatives for the proposed anesthesia with the patient or authorized representative who has indicated his/her understanding and acceptance.   Dental Advisory Given  Plan Discussed with: Anesthesiologist, CRNA and Surgeon  Anesthesia Plan Comments: (DNR suspended by patient and daughter for the procedure.  Spinal offered, but since one of the patients and daughters goals is to get him home earlier today they have elected for general anesthesia.  Patient and family informed that patient is higher risk for complications from  anesthesia during this procedure due to their medical history and age including but not limited to post operative cognitive dysfunction.  They voiced understanding.  Patient consented for risks of anesthesia including but not limited to:  - adverse reactions to medications - damage to teeth, lips or other oral mucosa - sore throat or hoarseness - Damage to heart, brain, lungs or loss of life  Patient voiced understanding.)        Anesthesia Quick Evaluation

## 2017-10-31 NOTE — Progress Notes (Signed)
Advanced care plan.  Purpose of the Encounter: CODE STATUS  Parties in Attendance: Patient himself  Patient's Decision Capacity: Intact  Subjective/Patient's story: Patient is 84 year old with history of prostate cancer, vitamin B12 deficiency admitted with difficulty swelling and chest pain   Objective/Medical story I confirm with the patient regarding his desires for cardiac and pulmonary resuscitation he states that he would not like cardiac or pulmonary resuscitation   Goals of care determination:  DNR   CODE STATUS: DNR   Time spent discussing advanced care planning: 16 minutes

## 2017-10-31 NOTE — Op Note (Signed)
[facility] Patient Name: Benjamin Villarreal Procedure Date: 10/31/2017 12:56 AM MRN: [id number] Account #: [id number] Date of Birth: 02/10/1921 Admit Type: Outpatient Age: 84 Room: [facility] ENDO ROOM 4 Gender: Male Note Status: Finalized Procedure:            Upper GI endoscopy Indications:          Esophageal dysphagia, Foreign body in the GI tract Providers:            Benay Pike. Allahna Husband MD, MD Medicines:            General Anesthesia Complications:        No immediate complications. Estimated blood loss:                        Minimal. Procedure:            Pre-Anesthesia Assessment:                       - The risks and benefits of the procedure and the                        sedation options and risks were discussed with the                        patient. All questions were answered and informed                        consent was obtained.                       - Patient identification and proposed procedure were                        verified prior to the procedure by the nurse. The                        procedure was verified in the procedure room.                       - ASA Grade Assessment: III - A patient with severe                        systemic disease.                       - After reviewing the risks and benefits, the patient                        was deemed in satisfactory condition to undergo the                        procedure.                       After obtaining informed consent, the endoscope was                        passed under direct vision. Throughout the procedure,                        the patient's blood pressure, pulse, and oxygen  saturations were monitored continuously. The Endoscope                        was introduced through the mouth, and advanced to the                        lower third of esophagus. The upper GI endoscopy was                        technically difficult and complex due  to stricture. The                        patient tolerated the procedure well. Findings:      Segmental severe mucosal changes characterized by congestion,       discoloration and friability (with spontaneous bleeding) were found in       the middle third of the esophagus and in the lower third of the       esophagus. Biopsies were taken with a cold forceps for histology.      One benign-appearing, intrinsic severe (stenosis; an endoscope cannot       pass) stenosis was found in the distal esophagus. This stenosis measured       8 mm (inner diameter) x less than one cm (in length). The stenosis was       not traversed. Cells for cytology were obtained by brushing.      Procedure was terminated at this time and the scope was withdrawn.       Examination of the remainder of the esophagus, entire stomach and entire       duodenum was not feasible. Estimated blood loss was minimal. Impression:           - Congestion, discoloration and friability (with                        spontaneous bleeding) mucosa in the esophagus.                        Worrisome for esophageal ischemia/necrosis. Biopsied.                       - Benign-appearing esophageal stenosis. Cells for                        cytology obtained. Recommendation:       - Admit the patient to hospital ward for observation.                       - NPO.                       - CT scan of the chest r/o malignancy.? esophageal                        necrosis Procedure Code(s):    --- Professional ---                       [phone], Esophagoscopy, flexible, transoral; with biopsy,                        single or multiple Diagnosis Code(s):    --- Professional ---  T18.9XXA, Foreign body of alimentary tract, part                        unspecified, initial encounter                       R13.14, Dysphagia, pharyngoesophageal phase                       K22.2, Esophageal obstruction CPT copyright 2017 American Medical  Association. All rights reserved. The codes documented in this report are preliminary and upon coder review may  be revised to meet current compliance requirements. Efrain Sella MD, MD 10/31/2017 2:51:36 AM This report has been signed electronically. Number of Addenda: 0 Note Initiated On: 10/31/2017 12:56 AM      [facility]

## 2017-10-31 NOTE — ED Notes (Signed)
md at bedside speaking with pt and family.

## 2017-11-01 ENCOUNTER — Encounter: Payer: Self-pay | Admitting: *Deleted

## 2017-11-01 DIAGNOSIS — R1310 Dysphagia, unspecified: Secondary | ICD-10-CM | POA: Diagnosis not present

## 2017-11-01 DIAGNOSIS — K228 Other specified diseases of esophagus: Secondary | ICD-10-CM | POA: Diagnosis not present

## 2017-11-01 DIAGNOSIS — R0789 Other chest pain: Secondary | ICD-10-CM | POA: Diagnosis not present

## 2017-11-01 DIAGNOSIS — K222 Esophageal obstruction: Secondary | ICD-10-CM | POA: Diagnosis not present

## 2017-11-01 DIAGNOSIS — C61 Malignant neoplasm of prostate: Secondary | ICD-10-CM | POA: Diagnosis not present

## 2017-11-01 DIAGNOSIS — R079 Chest pain, unspecified: Secondary | ICD-10-CM | POA: Diagnosis not present

## 2017-11-01 MED ORDER — PANTOPRAZOLE SODIUM 40 MG IV SOLR
40.0000 mg | Freq: Two times a day (BID) | INTRAVENOUS | Status: DC
  Administered 2017-11-01 – 2017-11-02 (×2): 40 mg via INTRAVENOUS
  Filled 2017-11-01 (×2): qty 40

## 2017-11-01 MED ORDER — SODIUM CHLORIDE 0.9 % IV SOLN: INTRAVENOUS | Status: DC

## 2017-11-01 MED ORDER — PANTOPRAZOLE SODIUM 40 MG PO TBEC
40.0000 mg | DELAYED_RELEASE_TABLET | Freq: Two times a day (BID) | ORAL | Status: DC
  Administered 2017-11-01: 40 mg via ORAL
  Filled 2017-11-01: qty 1

## 2017-11-01 MED ORDER — PANTOPRAZOLE SODIUM 40 MG PO TBEC: 40.0000 mg | DELAYED_RELEASE_TABLET | Freq: Two times a day (BID) | ORAL | 0 refills | Status: DC

## 2017-11-01 MED ORDER — SIMETHICONE 40 MG/0.6ML PO SUSP
80.0000 mg | Freq: Four times a day (QID) | ORAL | Status: DC | PRN
  Filled 2017-11-01: qty 1.2

## 2017-11-01 NOTE — Progress Notes (Signed)
AddendumMartin Villarreal to patient's room and discussed case with patient's two daughters. Questions answered. Patient actually did finish the full liquid diet with minimal difficulty and now complains of no pain. Patient and family ask to continue some po nutrition. They understand the risk of eating on perforation of the esophagus but also understand that not eating will not necessarily prevent complications. This patient decision to eat appears based on quality of life and there is no intention or desire by them to use alternative means of nutrition. I agreed to allow clear liquids today and if tolerated, possible discharge home on full liquids tomorrow.

## 2017-11-01 NOTE — Progress Notes (Signed)
[facility] Gastroenterology Inpatient Progress Note  Subjective: Patient seen for follow-up of dysphagia, esophageal stricture and esophageal necrosis.  Pathology still pending.  Patient is eating a full liquid diet ordered by Dr. Posey Pronto.  He is complaining of chest pain and throat discomfort when swallowing.  He has no abdominal pain and has not vomited.  Objective: Vital signs in last 24 hours: Temp:  [98.1 F (36.7 C)-98.7 F (37.1 C)] 98.1 F (36.7 C) (09/08 0412) Pulse Rate:  [51-76] 76 (09/08 0832) Resp:  [18] 18 (09/08 0832) BP: (141-157)/(67-78) 157/78 (09/08 0832) SpO2:  [97 %-100 %] 100 % (09/08 0832) Weight:  [78.5 kg] 78.5 kg (09/08 0412) Blood pressure (!) 157/78, pulse 76, temperature 98.1 F (36.7 C), temperature source Oral, resp. rate 18, height 6\' 2"  (1.88 m), weight 78.5 kg, SpO2 100 %.    Intake/Output from previous day: 09/07 0701 - 09/08 0700 In: 1978.3 [I.V.:1878.3; IV Piggyback:100] Out: 100 [Urine:100]  Intake/Output this shift: Total I/O In: 371.2 [I.V.:371.2] Out: -    General appearance: Alert, normal mood and behavior. Resp: Clear to auscultation Cardio: Regular rate no gallop noted. GI: Soft, benign.  Bowel sounds positive Extremities: No edema   Lab Results: Results for orders placed or performed during the hospital encounter of 10/30/17 (from the past 24 hour(s))  Troponin I     Status: None   Collection Time: 10/31/17 11:33 AM  Result Value Ref Range   Troponin I <0.03 <0.03 ng/mL  Troponin I     Status: None   Collection Time: 10/31/17  6:05 PM  Result Value Ref Range   Troponin I <0.03 <0.03 ng/mL  CBC with Differential/Platelet     Status: Abnormal   Collection Time: 10/31/17  6:05 PM  Result Value Ref Range   WBC 6.5 3.8 - 10.6 K/uL   RBC 3.80 (L) 4.40 - 5.90 MIL/uL   Hemoglobin 11.8 (L) 13.0 - 18.0 g/dL   HCT 34.5 (L) 40.0 - 52.0 %   MCV 90.8 80.0 - 100.0 fL   MCH 31.2 26.0 - 34.0 pg   MCHC 34.3 32.0 - 36.0 g/dL   RDW  13.8 11.5 - 14.5 %   Platelets 125 (L) 150 - 440 K/uL   Neutrophils Relative % 59 %   Neutro Abs 3.8 1.4 - 6.5 K/uL   Lymphocytes Relative 24 %   Lymphs Abs 1.6 1.0 - 3.6 K/uL   Monocytes Relative 12 %   Monocytes Absolute 0.8 0.2 - 1.0 K/uL   Eosinophils Relative 4 %   Eosinophils Absolute 0.3 0 - 0.7 K/uL   Basophils Relative 1 %   Basophils Absolute 0.1 0 - 0.1 K/uL     Recent Labs    10/30/17 2211 10/31/17 1805  WBC 9.9 6.5  HGB 12.0* 11.8*  HCT 35.3* 34.5*  PLT 145* 125*   BMET Recent Labs    10/30/17 2211  NA 131*  K 4.2  CL 99  CO2 24  GLUCOSE 148*  BUN 24*  CREATININE 0.90  CALCIUM 8.5*   LFT Recent Labs    10/30/17 2211  PROT 7.2  ALBUMIN 4.0  AST 23  ALT 12  ALKPHOS 49  BILITOT 0.8   PT/INR No results for input(s): LABPROT, INR in the last 72 hours. Hepatitis Panel No results for input(s): HEPBSAG, HCVAB, HEPAIGM, HEPBIGM in the last 72 hours. C-Diff No results for input(s): CDIFFTOX in the last 72 hours. No results for input(s): CDIFFPCR in the last 72 hours.  Studies/Results: Ct Chest W Contrast  Result Date: 10/31/2017 CLINICAL DATA:  Chest pain. EGD showed stricture and possible esophageal necrosis. Metastatic prostate cancer. EXAM: CT CHEST WITH CONTRAST TECHNIQUE: Multidetector CT imaging of the chest was performed during intravenous contrast administration. CONTRAST:  38mL OMNIPAQUE IOHEXOL 300 MG/ML  SOLN COMPARISON:  10/30/2017 chest radiograph FINDINGS: Cardiovascular: Coronary, aortic arch, and branch vessel atherosclerotic vascular disease. Ascending aortic aneurysm 4.0 cm in diameter on image 72/2. Borderline cardiomegaly. Mediastinum/Nodes: Distal thoracic esophageal wall thickening along the lower 12 cm of the esophagus. There is potentially a small amount of periesophageal fluid on image 120/2. No obvious pneumomediastinum or gas in the wall of the esophagus. No particularly enlarged paraesophageal lymph nodes or other significant  pathologic adenopathy in the chest. Lungs/Pleura: Peripheral interstitial accentuation in both lungs favoring the lung apices. There is some bandlike atelectasis or scarring in the posterior basal segment right lower lobe along with a calcified peripheral granuloma in the posterior basal segment. Trace right pleural effusion. Mild airway thickening. Upper Abdomen: 5.4 cm fluid density lesion of the right kidney upper pole is probably a cyst and appears homogeneous. Musculoskeletal: Degenerative glenohumeral arthropathy. Thoracic spondylosis and mild thoracic kyphosis. No compelling findings of osseous metastatic disease. Mild dextroconvex thoracic scoliosis. IMPRESSION: 1. Abnormal wall thickening in the lower 12 cm of the thoracic esophagus, potentially from inflammation or tumor. There is potentially some periesophageal edema associated with the lower thoracic esophagus but no pneumomediastinum or gas tracking within the wall of the esophagus is observed. 2. Peripheral interstitial accentuation which seems to favor the lung apices. This is nonspecific and can be seen in a variety of conditions including sarcoidosis, Langerhans cell histiocytosis, pneumoconioses, chronic hypersensitivity pneumonitis, autoimmune conditions and connective tissue disorders, and other conditions. No honeycombing observed. 3. Airway thickening is present, suggesting bronchitis or reactive airways disease. 4.  Aortic Atherosclerosis (ICD10-I70.0).  Coronary atherosclerosis. 5. Ascending aortic aneurysm 4.0 cm in diameter. Recommend annual imaging followup by CTA or MRA. This recommendation follows 2010 ACCF/AHA/AATS/ACR/ASA/SCA/SCAI/SIR/STS/SVM Guidelines for the Diagnosis and Management of Patients with Thoracic Aortic Disease. Circulation. 2010; 121: F643-P295 Electronically Signed   By: Van Clines M.D.   On: 10/31/2017 08:17   Dg Abdomen Acute W/chest  Result Date: 10/30/2017 CLINICAL DATA:  Chest pain worsened by eating  EXAM: DG ABDOMEN ACUTE W/ 1V CHEST COMPARISON:  CT 09/21/2017, chest radiograph 06/04/2015 FINDINGS: Single-view chest demonstrates chronic interstitial changes. No focal opacity or pleural effusion. Normal cardiomediastinal silhouette with aortic atherosclerosis. No pneumothorax. Supine and upright views of the abdomen demonstrate no free air beneath the diaphragm. Overall nonobstructed gas pattern with mild gas-filled colon. Multiple surgical clips in the low pelvis. IMPRESSION: 1. No radiographic evidence for acute cardiopulmonary abnormality. Chronic interstitial disease 2. Overall nonobstructed gas pattern Electronically Signed   By: Donavan Foil M.D.   On: 10/30/2017 22:16    Scheduled Inpatient Medications:   . docusate sodium  100 mg Oral BID    Continuous Inpatient Infusions:   . sodium chloride      PRN Inpatient Medications:  acetaminophen **OR** acetaminophen, morphine injection, ondansetron (ZOFRAN) IV, ondansetron **OR** ondansetron (ZOFRAN) IV  Miscellaneous: Pathology pending.  Assessment:  1.  Presumed esophageal necrosis based upon endoscopic findings-rehydration underway.  Not aware patient was not placed on IV proton pump inhibitor therapy at admission.  Dr. Posey Pronto has ordered the medication today. 2.  Fluids electrolyte nutrition-we will make patient n.p.o. again given symptoms of throat pain and chest pain after attempting a full  liquid diet. 3.  Metastatic prostate cancer. 4.  DO NOT RESUSCITATE status.  Plan:  As above.  Will follow along.  Hopefully can start clear liquids in the next 12 to 24 hours.  Teodoro K. Alice Reichert, M.D. 11/01/2017, 9:52 AM

## 2017-11-01 NOTE — Plan of Care (Signed)
  Problem: Education: Goal: Knowledge of General Education information will improve Description Including pain rating scale, medication(s)/side effects and non-pharmacologic comfort measures Outcome: Progressing   Problem: Nutrition: Goal: Adequate nutrition will be maintained Outcome: Progressing Note:  Pt NPO, receiving IV fluids though   Problem: Pain Managment: Goal: General experience of comfort will improve Outcome: Progressing

## 2017-11-01 NOTE — Progress Notes (Signed)
[place] at [facility]                                                                                                                                                                                  Patient Demographics   Benjamin Villarreal, is a 84 y.o. male, DOB - 10-12-1920, YHC:[id number]  Admit date - 10/30/2017   Admitting Physician Harrie Foreman, MD  Outpatient Primary MD for the patient is Venia Carbon, MD   LOS - 0  Subjective: Patient very anxious to eat he states that he is not having any pain he complains of some abdominal discomfort but none in his chest    Review of Systems:   CONSTITUTIONAL: No documented fever. No fatigue, weakness. No weight gain, no weight loss.  EYES: No blurry or double vision.  ENT: No tinnitus. No postnasal drip. No redness of the oropharynx.  RESPIRATORY: No cough, no wheeze, no hemoptysis. No dyspnea.  CARDIOVASCULAR: No chest pain. No orthopnea. No palpitations. No syncope.  GASTROINTESTINAL: No nausea, no vomiting or diarrhea.  Positive abdominal pain. No melena or hematochezia.  GENITOURINARY: No dysuria or hematuria.  ENDOCRINE: No polyuria or nocturia. No heat or cold intolerance.  HEMATOLOGY: No anemia. No bruising. No bleeding.  INTEGUMENTARY: No rashes. No lesions.  MUSCULOSKELETAL: No arthritis. No swelling. No gout.  NEUROLOGIC: No numbness, tingling, or ataxia. No seizure-type activity.  PSYCHIATRIC: No anxiety. No insomnia. No ADD.    Vitals:   Vitals:   10/31/17 0814 10/31/17 1951 11/01/17 0412 11/01/17 0832  BP: (!) 123/56 (!) 143/67 (!) 141/69 (!) 157/78  Pulse: 66 (!) 51 (!) 55 76  Resp: 18 18 18 18   Temp: 98.3 F (36.8 C) 98.7 F (37.1 C) 98.1 F (36.7 C)   TempSrc: Oral Oral Oral   SpO2: 95% 97% 98% 100%  Weight:   78.5 kg   Height:        Wt Readings from Last 3 Encounters:  11/01/17 78.5 kg  10/22/17 83.6 kg  10/14/17 83.5 kg     Intake/Output Summary (Last 24 hours) at  11/01/2017 1308 Last data filed at 11/01/2017 1211 Gross per 24 hour  Intake 2349.54 ml  Output 325 ml  Net 2024.54 ml    Physical Exam:   GENERAL: Pleasant-appearing in no apparent distress.  HEAD, EYES, EARS, NOSE AND THROAT: Atraumatic, normocephalic. Extraocular muscles are intact. Pupils equal and reactive to light. Sclerae anicteric. No conjunctival injection. No oro-pharyngeal erythema.  NECK: Supple. There is no jugular venous distention. No bruits, no lymphadenopathy, no thyromegaly.  HEART: Regular rate and rhythm,. No murmurs, no rubs, no clicks.  LUNGS: Clear to auscultation  bilaterally. No rales or rhonchi. No wheezes.  ABDOMEN: Soft, flat, nontender, nondistended. Has good bowel sounds. No hepatosplenomegaly appreciated.  EXTREMITIES: No evidence of any cyanosis, clubbing, or peripheral edema.  +2 pedal and radial pulses bilaterally.  NEUROLOGIC: The patient is alert, awake, and oriented x3 with no focal motor or sensory deficits appreciated bilaterally.  SKIN: Moist and warm with no rashes appreciated.  Psych: Not anxious, depressed LN: No inguinal LN enlargement    Antibiotics   Anti-infectives (From admission, onward)   None      Medications   Scheduled Meds: . docusate sodium  100 mg Oral BID  . pantoprazole (PROTONIX) IV  40 mg Intravenous Q12H   Continuous Infusions: . sodium chloride     PRN Meds:.acetaminophen **OR** acetaminophen, morphine injection, ondansetron (ZOFRAN) IV, ondansetron **OR** ondansetron (ZOFRAN) IV   Data Review:   Micro Results Recent Results (from the past 240 hour(s))  MRSA PCR Screening     Status: None   Collection Time: 10/31/17  4:31 AM  Result Value Ref Range Status   MRSA by PCR NEGATIVE NEGATIVE Final    Comment:        The GeneXpert MRSA Assay (FDA approved for NASAL specimens only), is one component of a comprehensive MRSA colonization surveillance program. It is not intended to diagnose MRSA infection nor to  guide or monitor treatment for MRSA infections. Performed at [facility], [address], [place], [place] [address]     Radiology Reports Ct Chest W Contrast  Result Date: 10/31/2017 CLINICAL DATA:  Chest pain. EGD showed stricture and possible esophageal necrosis. Metastatic prostate cancer. EXAM: CT CHEST WITH CONTRAST TECHNIQUE: Multidetector CT imaging of the chest was performed during intravenous contrast administration. CONTRAST:  8mL OMNIPAQUE IOHEXOL 300 MG/ML  SOLN COMPARISON:  10/30/2017 chest radiograph FINDINGS: Cardiovascular: Coronary, aortic arch, and branch vessel atherosclerotic vascular disease. Ascending aortic aneurysm 4.0 cm in diameter on image 72/2. Borderline cardiomegaly. Mediastinum/Nodes: Distal thoracic esophageal wall thickening along the lower 12 cm of the esophagus. There is potentially a small amount of periesophageal fluid on image 120/2. No obvious pneumomediastinum or gas in the wall of the esophagus. No particularly enlarged paraesophageal lymph nodes or other significant pathologic adenopathy in the chest. Lungs/Pleura: Peripheral interstitial accentuation in both lungs favoring the lung apices. There is some bandlike atelectasis or scarring in the posterior basal segment right lower lobe along with a calcified peripheral granuloma in the posterior basal segment. Trace right pleural effusion. Mild airway thickening. Upper Abdomen: 5.4 cm fluid density lesion of the right kidney upper pole is probably a cyst and appears homogeneous. Musculoskeletal: Degenerative glenohumeral arthropathy. Thoracic spondylosis and mild thoracic kyphosis. No compelling findings of osseous metastatic disease. Mild dextroconvex thoracic scoliosis. IMPRESSION: 1. Abnormal wall thickening in the lower 12 cm of the thoracic esophagus, potentially from inflammation or tumor. There is potentially some periesophageal edema associated with the lower thoracic esophagus but no  pneumomediastinum or gas tracking within the wall of the esophagus is observed. 2. Peripheral interstitial accentuation which seems to favor the lung apices. This is nonspecific and can be seen in a variety of conditions including sarcoidosis, Langerhans cell histiocytosis, pneumoconioses, chronic hypersensitivity pneumonitis, autoimmune conditions and connective tissue disorders, and other conditions. No honeycombing observed. 3. Airway thickening is present, suggesting bronchitis or reactive airways disease. 4.  Aortic Atherosclerosis (ICD10-I70.0).  Coronary atherosclerosis. 5. Ascending aortic aneurysm 4.0 cm in diameter. Recommend annual imaging followup by CTA or MRA. This recommendation follows 2010 ACCF/AHA/AATS/ACR/ASA/SCA/SCAI/SIR/STS/SVM  Guidelines for the Diagnosis and Management of Patients with Thoracic Aortic Disease. Circulation. 2010; 121: E092-Z300 Electronically Signed   By: Van Clines M.D.   On: 10/31/2017 08:17   Ultrasound Renal Complete  Result Date: 10/16/2017 CLINICAL DATA:  LEFT hydronephrosis, follow-up, history prostate cancer EXAM: RENAL / URINARY TRACT ULTRASOUND COMPLETE COMPARISON:  CT abdomen and pelvis 09/21/2017 FINDINGS: Right Kidney: Length: 11.7 cm. Cortical thinning. Upper normal cortical echogenicity. Cyst at upper pole containing a single thin septation, size overall measuring 4.9 x 4.6 x 5.1 cm and corresponding to lesion seen on CT. No additional mass, hydronephrosis or shadowing calcification. Left Kidney: Length: 11.4 cm. Cortical thinning. Upper normal cortical echogenicity. Tiny cyst at inferior pole 9 x 7 x 10 mm corresponding to a tiny cyst on CT, appears too small to correlate with a 15 mm exophytic lesion on the prior CT exam. No definite ultrasound finding is identified at the inferior pole of LEFT kidney to correspond to this previously seen 15 mm nodule. Bladder: Appears normal for degree of bladder distention. IMPRESSION: Minimally complicated upper  pole RIGHT renal cyst 5.1 cm greatest size. Tiny 10 mm cyst inferior pole LEFT kidney. No urinary tract calcification or dilatation. An additional intermediate attenuation nodule exophytic at inferior pole LEFT kidney on prior CT exam 15 mm greatest size is not sonographically evident on today's exam; this nodule appears to be slightly larger than was seen on earlier CT from 2018. Further characterization of this lesion by MR imaging with and without contrast recommended to exclude renal neoplasm. Electronically Signed   By: Lavonia Dana M.D.   On: 10/16/2017 17:36   Dg Abdomen Acute W/chest  Result Date: 10/30/2017 CLINICAL DATA:  Chest pain worsened by eating EXAM: DG ABDOMEN ACUTE W/ 1V CHEST COMPARISON:  CT 09/21/2017, chest radiograph 06/04/2015 FINDINGS: Single-view chest demonstrates chronic interstitial changes. No focal opacity or pleural effusion. Normal cardiomediastinal silhouette with aortic atherosclerosis. No pneumothorax. Supine and upright views of the abdomen demonstrate no free air beneath the diaphragm. Overall nonobstructed gas pattern with mild gas-filled colon. Multiple surgical clips in the low pelvis. IMPRESSION: 1. No radiographic evidence for acute cardiopulmonary abnormality. Chronic interstitial disease 2. Overall nonobstructed gas pattern Electronically Signed   By: Donavan Foil M.D.   On: 10/30/2017 22:16     CBC Recent Labs  Lab 10/30/17 2211 10/31/17 1805  WBC 9.9 6.5  HGB 12.0* 11.8*  HCT 35.3* 34.5*  PLT 145* 125*  MCV 89.8 90.8  MCH 30.6 31.2  MCHC 34.1 34.3  RDW 13.4 13.8  LYMPHSABS 1.7 1.6  MONOABS 0.8 0.8  EOSABS 0.3 0.3  BASOSABS 0.1 0.1    Chemistries  Recent Labs  Lab 10/30/17 2211  NA 131*  K 4.2  CL 99  CO2 24  GLUCOSE 148*  BUN 24*  CREATININE 0.90  CALCIUM 8.5*  AST 23  ALT 12  ALKPHOS 49  BILITOT 0.8   ------------------------------------------------------------------------------------------------------------------ estimated  creatinine clearance is 54.5 mL/min (by C-G formula based on SCr of 0.9 mg/dL). ------------------------------------------------------------------------------------------------------------------ No results for input(s): HGBA1C in the last 72 hours. ------------------------------------------------------------------------------------------------------------------ No results for input(s): CHOL, HDL, LDLCALC, TRIG, CHOLHDL, LDLDIRECT in the last 72 hours. ------------------------------------------------------------------------------------------------------------------ Recent Labs    10/31/17 0536  TSH 1.636   ------------------------------------------------------------------------------------------------------------------ No results for input(s): VITAMINB12, FOLATE, FERRITIN, TIBC, IRON, RETICCTPCT in the last 72 hours.  Coagulation profile No results for input(s): INR, PROTIME in the last 168 hours.  No results for input(s): DDIMER in the last 72  hours.  Cardiac Enzymes Recent Labs  Lab 10/31/17 0536 10/31/17 1133 10/31/17 1805  TROPONINI <0.03 <0.03 <0.03   ------------------------------------------------------------------------------------------------------------------ Invalid input(s): POCBNP    Assessment & Plan  This is a 84 year old male admitted for chest pain. 1.  Chest pain: No further chest pain this was related to his ongoing esophageal issue 2.  Odynophagia: The patient is undergone emergent EGD which showed esophageal stricture and likely esophageal necrosis.    CT of the chest shows no malignancy other than esophageal thickening, discussed with GI regarding clear liquid diet, I have discussed with pharmacy to start IV Protonix 3.  Prostate cancer: Metastatic; status post radical prostatectomy.  Consult oncology and/or palliative care when more information is obtained. 4.  DVT prophylaxis: SCDs 5.  GI prophylaxis: None      Code Status Orders  (From admission,  onward)         Start     Ordered   10/31/17 0427  Do not attempt resuscitation (DNR)  Continuous    Question Answer Comment  In the event of cardiac or respiratory ARREST Do not call a "code blue"   In the event of cardiac or respiratory ARREST Do not perform Intubation, CPR, defibrillation or ACLS   In the event of cardiac or respiratory ARREST Use medication by any route, position, wound care, and other measures to relive pain and suffering. May use oxygen, suction and manual treatment of airway obstruction as needed for comfort.      10/31/17 0426        Code Status History    Date Active Date Inactive Code Status Order ID Comments User Context   05/06/2017 0135 05/07/2017 1533 Full Code [id number]  Lance Coon, MD ED   05/01/2017 1840 05/04/2017 1949 Full Code [id number]  Nicholes Mango, MD Inpatient    Advance Directive Documentation     Most Recent Value  Type of Advance Directive  Out of facility DNR (pink MOST or yellow form)  Pre-existing out of facility DNR order (yellow form or pink MOST form)  Physician notified to receive inpatient order  "MOST" Form in Place?  -           Consults  gi  DVT Prophylaxis  scds  Lab Results  Component Value Date   PLT 125 (L) 10/31/2017     Time Spent in minutes   70min Greater than 50% of time spent in care coordination and counseling patient regarding the condition and plan of care.   Dustin Flock M.D on 11/01/2017 at 1:08 PM  Between 7am to 6pm - Pager - [phone]  After 6pm go to [email or web address] - Proofreader  [employer]   Office  [phone]

## 2017-11-02 ENCOUNTER — Ambulatory Visit: Payer: Medicare Other | Admitting: Internal Medicine

## 2017-11-02 ENCOUNTER — Encounter: Payer: Self-pay | Admitting: Internal Medicine

## 2017-11-02 DIAGNOSIS — Z66 Do not resuscitate: Secondary | ICD-10-CM | POA: Diagnosis present

## 2017-11-02 DIAGNOSIS — T189XXA Foreign body of alimentary tract, part unspecified, initial encounter: Secondary | ICD-10-CM | POA: Diagnosis present

## 2017-11-02 DIAGNOSIS — K222 Esophageal obstruction: Secondary | ICD-10-CM | POA: Diagnosis present

## 2017-11-02 DIAGNOSIS — R1314 Dysphagia, pharyngoesophageal phase: Secondary | ICD-10-CM | POA: Diagnosis present

## 2017-11-02 DIAGNOSIS — Z91018 Allergy to other foods: Secondary | ICD-10-CM | POA: Diagnosis not present

## 2017-11-02 DIAGNOSIS — Z806 Family history of leukemia: Secondary | ICD-10-CM | POA: Diagnosis not present

## 2017-11-02 DIAGNOSIS — M159 Polyosteoarthritis, unspecified: Secondary | ICD-10-CM | POA: Diagnosis present

## 2017-11-02 DIAGNOSIS — Z85828 Personal history of other malignant neoplasm of skin: Secondary | ICD-10-CM | POA: Diagnosis not present

## 2017-11-02 DIAGNOSIS — Z7982 Long term (current) use of aspirin: Secondary | ICD-10-CM | POA: Diagnosis not present

## 2017-11-02 DIAGNOSIS — C61 Malignant neoplasm of prostate: Secondary | ICD-10-CM | POA: Diagnosis present

## 2017-11-02 DIAGNOSIS — M810 Age-related osteoporosis without current pathological fracture: Secondary | ICD-10-CM | POA: Diagnosis present

## 2017-11-02 DIAGNOSIS — Z7951 Long term (current) use of inhaled steroids: Secondary | ICD-10-CM | POA: Diagnosis not present

## 2017-11-02 DIAGNOSIS — E538 Deficiency of other specified B group vitamins: Secondary | ICD-10-CM | POA: Diagnosis present

## 2017-11-02 DIAGNOSIS — C799 Secondary malignant neoplasm of unspecified site: Secondary | ICD-10-CM | POA: Diagnosis present

## 2017-11-02 DIAGNOSIS — Z8673 Personal history of transient ischemic attack (TIA), and cerebral infarction without residual deficits: Secondary | ICD-10-CM | POA: Diagnosis not present

## 2017-11-02 DIAGNOSIS — Z96653 Presence of artificial knee joint, bilateral: Secondary | ICD-10-CM | POA: Diagnosis present

## 2017-11-02 DIAGNOSIS — Z9842 Cataract extraction status, left eye: Secondary | ICD-10-CM | POA: Diagnosis not present

## 2017-11-02 DIAGNOSIS — Z9079 Acquired absence of other genital organ(s): Secondary | ICD-10-CM | POA: Diagnosis not present

## 2017-11-02 DIAGNOSIS — G629 Polyneuropathy, unspecified: Secondary | ICD-10-CM | POA: Diagnosis present

## 2017-11-02 DIAGNOSIS — Z86010 Personal history of colonic polyps: Secondary | ICD-10-CM | POA: Diagnosis not present

## 2017-11-02 DIAGNOSIS — M40203 Unspecified kyphosis, cervicothoracic region: Secondary | ICD-10-CM | POA: Diagnosis present

## 2017-11-02 DIAGNOSIS — I700 Atherosclerosis of aorta: Secondary | ICD-10-CM | POA: Diagnosis present

## 2017-11-02 DIAGNOSIS — R1310 Dysphagia, unspecified: Secondary | ICD-10-CM | POA: Diagnosis not present

## 2017-11-02 DIAGNOSIS — Z9841 Cataract extraction status, right eye: Secondary | ICD-10-CM | POA: Diagnosis not present

## 2017-11-02 DIAGNOSIS — R079 Chest pain, unspecified: Secondary | ICD-10-CM | POA: Diagnosis not present

## 2017-11-02 DIAGNOSIS — Z833 Family history of diabetes mellitus: Secondary | ICD-10-CM | POA: Diagnosis not present

## 2017-11-02 MED ORDER — ENSURE ENLIVE PO LIQD: 237.0000 mL | Freq: Three times a day (TID) | ORAL | Status: DC

## 2017-11-02 MED ORDER — ENSURE ENLIVE PO LIQD: 237.0000 mL | Freq: Three times a day (TID) | ORAL | 12 refills | Status: DC

## 2017-11-02 NOTE — Care Management (Signed)
Reached out to attending regarding outpatient palliative to follow at [facility].  Patient is DNR.  He wants to eat and does not wish to pursue artificial  nutrition. To discharge back to assisted living level of care. DNR in place. left message for daughter [name] to discuss palliative.  Heads up to [facility]

## 2017-11-02 NOTE — Progress Notes (Signed)
Discharge report called to Rodman Pickle at [facility] verbalized an understanding/ iv and tele removed/ packet given to family to give to facility / family to transport

## 2017-11-02 NOTE — Clinical Social Work Note (Addendum)
CSW was informed that patient is from [facility], [place] contacted [place], and she would like bedside nurse to call her due to having some specific questions.  CSW contacted bedside nurse, and gave her contact information to speak with patient's nurse at [facility].  Patient to be d/c'ed today to [facility].  Patient and family agreeable to plans will transport via family car.  CSW spoke to [place] at [facility] [phone] and she can accept patient back to ALF today.   Jones Broom. [place], MSW, [place]  11/02/2017 2:42 PM

## 2017-11-02 NOTE — Progress Notes (Signed)
New referral for outpatient Palliative to follow at [facility] received from [facility]. Plan is for patient to discharge today. Patient information faxed to referral. Thank you Flo Shanks RN, BSN, [facility] [place], hospital  Liaison [phone]

## 2017-11-02 NOTE — NC FL2 (Addendum)
[place] LEVEL OF CARE SCREENING TOOL     IDENTIFICATION  Patient Name: Benjamin Villarreal Birthdate: [birth year] Sex: male Admission Date (Current Location): 10/30/2017  [place] and [place] Number:  Engineering geologist and Address:  [facility], [address] [place], [place] [address]      Provider Number: [id number]  Attending Physician Name and Address:  Dustin Flock, MD  Relative Name and Phone Number:  [name] Daughter [phone] or Godfrey,Lee Daughter [phone] or Virl Cagey. Son [phone]     Current Level of Care: Hospital Recommended Level of Care: [place] Prior Approval Number:    Date Approved/Denied:   PASRR Number:    Discharge Plan: Domiciliary (Rest home)([facility])    Current Diagnoses: Patient Active Problem List   Diagnosis Date Noted  . Esophageal stricture 10/31/2017  . Primary prostate cancer with metastasis from prostate to other site [facility]) 07/23/2017  . Stasis dermatitis 07/23/2017  . Aortic atherosclerosis ([place]) 10/09/2016  . Peripheral neuropathy 01/24/2016  . 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 Allergic rhinitis due to pollen 10/21/2012  . Prostate cancer ([place]) 03/22/2009  . Chronic venous insufficiency 09/07/2008  . CONSTIPATION, CHRONIC 09/07/2008  . Osteoarthritis, multiple sites 09/05/2008  . Osteoporosis 09/05/2008    Orientation RESPIRATION BLADDER Height & Weight     Time, Situation, Place, Self  Normal Continent Weight: 175 lb 9.6 oz (79.7 kg) Height:  6\' 2"  (188 cm)  BEHAVIORAL SYMPTOMS/MOOD NEUROLOGICAL BOWEL NUTRITION STATUS      Continent Full liquid diet for next three days then can do low fat diet  AMBULATORY STATUS COMMUNICATION OF NEEDS Skin   Limited Assist Verbally Normal                       Personal Care Assistance Level of Assistance  Bathing, Feeding, Dressing Bathing Assistance: Limited assistance Feeding assistance:  Independent Dressing Assistance: Limited assistance     Functional Limitations Info  Sight, Hearing, Speech Sight Info: Adequate Hearing Info: Adequate Speech Info: Adequate    SPECIAL CARE FACTORS FREQUENCY                       Contractures Contractures Info: Not present    Additional Factors Info  Code Status, Allergies Code Status Info: DNR Allergies Info: RAW ONIONS- SINUS REACTION           Current Medications (11/02/2017):  This is the current hospital active medication list Current Facility-Administered Medications  Medication Dose Route Frequency Provider Last Rate Last Dose  . acetaminophen (TYLENOL) tablet 650 mg  650 mg Oral Q6H PRN Harrie Foreman, MD   650 mg at 11/01/17 2153   Or  . acetaminophen (TYLENOL) suppository 650 mg  650 mg Rectal Q6H PRN Harrie Foreman, MD      . docusate sodium (COLACE) capsule 100 mg  100 mg Oral BID Harrie Foreman, MD   100 mg at 11/01/17 2148  . feeding supplement (ENSURE ENLIVE) (ENSURE ENLIVE) liquid 237 mL  237 mL Oral TID BM Dustin Flock, MD      . morphine 2 MG/ML injection 1 mg  1 mg Intravenous Q4H PRN Dustin Flock, MD      . ondansetron [facility]) injection 4 mg  4 mg Intravenous Once PRN Martha Clan, MD      . ondansetron [facility]) tablet 4 mg  4 mg Oral Q6H PRN Harrie Foreman, MD  Or  . ondansetron (ZOFRAN) injection 4 mg  4 mg Intravenous Q6H PRN Harrie Foreman, MD      . pantoprazole (PROTONIX) injection 40 mg  40 mg Intravenous Q12H Dustin Flock, MD   40 mg at 11/02/17 0903  . simethicone (MYLICON) 40 TF/5.7DU suspension 80 mg  80 mg Oral QID PRN Dustin Flock, MD       Facility-Administered Medications Ordered in Other Encounters  Medication Dose Route Frequency Provider Last Rate Last Dose  . leuprolide (LUPRON) injection 30 mg  30 mg Intramuscular Once Leia Alf, MD         Discharge Medications: STOP taking these medications   clindamycin 150 MG  capsule Commonly known as:  CLEOCIN   HYDROcodone-acetaminophen 5-325 MG tablet Commonly known as:  NORCO/VICODIN   ibuprofen 200 MG tablet Commonly known as:  ADVIL,MOTRIN   mupirocin ointment 2 % Commonly known as:  BACTROBAN   traMADol 50 MG tablet Commonly known as:  ULTRAM     TAKE these medications   acetaminophen 325 MG tablet Commonly known as:  TYLENOL Take 325-650 mg by mouth every 4 (four) hours as needed for fever (general discomfort).   alum & mag hydroxide-simeth 200-200-20 MG/5ML suspension Commonly known as:  MAALOX/MYLANTA Take 30 mLs by mouth every 4 (four) hours as needed for indigestion or heartburn.   aspirin EC 81 MG tablet Take 81 mg by mouth every other day.   Biotin 2.5 MG Caps Take 2.5 mg by mouth daily.   Calcium-D 600-400 MG-UNIT Tabs Take 1 tablet by mouth daily.   carbamide peroxide 6.5 % OTIC solution Commonly known as:  DEBROX 5 drops daily as needed.   cholecalciferol 1000 units tablet Commonly known as:  VITAMIN D Take 1,000 Units by mouth 2 (two) times daily.   ciclopirox 0.77 % cream Commonly known as:  LOPROX Apply topically.   cyclobenzaprine 5 MG tablet Commonly known as:  FLEXERIL Take 5 mg by mouth at bedtime as needed for muscle spasms.   dextromethorphan-guaiFENesin 10-100 MG/5ML liquid Commonly known as:  ROBITUSSIN-DM Take 10 mLs by mouth every 4 (four) hours as needed for cough.   diphenhydrAMINE 25 mg capsule Commonly known as:  BENADRYL Take 25 mg by mouth every 4 (four) hours as needed for itching or allergies.   enzalutamide 40 MG capsule Commonly known as:  XTANDI Take 2 capsules (80 mg total) by mouth daily.   eucerin cream Apply 1 application topically daily.   feeding supplement (ENSURE ENLIVE) Liqd Take 237 mLs by mouth 3 (three) times daily between meals.   fluPHENAZine decanoate 25 MG/ML injection Commonly known as:  PROLIXIN Inject into the muscle.   fluticasone 50  MCG/ACT nasal spray Commonly known as:  FLONASE Place 1 spray into both nostrils daily.   KAOPECTATE 262 MG/15ML suspension Generic drug:  bismuth subsalicylate Take 10 mLs by mouth 3 (three) times daily as needed for diarrhea or loose stools.   latanoprost 0.005 % ophthalmic solution Commonly known as:  XALATAN Place 1 drop into both eyes at bedtime.   loratadine 10 MG tablet Commonly known as:  CLARITIN Take 20 mg by mouth at bedtime.   nystatin powder Generic drug:  nystatin Apply 1 g topically 2 (two) times daily as needed (rash). Use 1 application twice daily as needed for rash   pantoprazole 40 MG tablet Commonly known as:  PROTONIX Take 1 tablet (40 mg total) by mouth 2 (two) times daily.   penicillin v potassium 500  MG tablet Commonly known as:  VEETID Take 500 mg by mouth 2 (two) times daily.   polyethylene glycol powder powder Commonly known as:  GLYCOLAX/MIRALAX FILL TO LINE (17 GRAMS ), MIX AND DRINK TWICE A DAY AND 8.5 GRAMS AT NOON   sennosides-docusate sodium 8.6-50 MG tablet Commonly known as:  SENOKOT-S Take 4 tablets by mouth every evening.   sodium chloride 0.65 % Soln nasal spray Commonly known as:  OCEAN Place 1-2 sprays into both nostrils 2 (two) times daily as needed for congestion.   triamcinolone cream 0.1 % Commonly known as:  KENALOG Apply 1 application topically daily as needed.   vitamin B-12 1000 MCG tablet Commonly known as:  CYANOCOBALAMIN Take 1,000 mcg by mouth daily.   vitamin C 500 MG tablet Commonly known as:  ASCORBIC ACID Take 500 mg by mouth daily.     Relevant Imaging Results:  Relevant Lab Results:   Additional Information SSN [id number]  Ross Ludwig, [place]

## 2017-11-02 NOTE — Clinical Social Work Note (Signed)
Clinical Social Work Assessment  Patient Details  Name: Benjamin Villarreal MRN: [id number] Date of Birth: 01/24/1921  Date of referral:  11/02/17               Reason for consult:  Facility Placement                Permission sought to share information with:  Family Supports, Customer service manager Permission granted to share information::  Yes, Verbal Permission Granted  Name::     [name] Daughter [phone] or Godfrey,Lee Daughter [phone] or Virl Cagey. Son [phone]  Agency::  [facility]  Relationship::     Contact Information:     Housing/Transportation Living arrangements for the past 2 months:  [place] of Information:  Patient, Adult Children, Facility Patient Interpreter Needed:  None Criminal Activity/Legal Involvement Pertinent to Current Situation/Hospitalization:  No - Comment as needed Significant Relationships:  Adult Children Lives with:  Self, Facility Resident Do you feel safe going back to the place where you live?  Yes Need for family participation in patient care:  Yes (Comment)  Care giving concerns:  Patient and family did not express any concerns about returning back to [facility].   Social Worker assessment / plan:  Patient is a 84 year old male who is alert and oriented x4.  Patient is a resident at [facility], patient states he has been there about nine years and is happy with the way they treat him.  Patient states the care has been going well, and he does not have any complaints.  Patient normally uses his walker to get around ALF, and eats meals in the dining room with his peers. Patient states he lives alone, but his family visit him when they can.  Patient states he is quite popular at [facility] and has many friends who live there too.  Patient asked when he will be abe leaving because he is ready to leave.  Patient's family are at bedside and did not express any other questions.  Patient and family  gave CSW permission to contact [facility] to facilitate discharge planning.  Employment status:  Retired Forensic scientist:  Medicare PT Recommendations:  Not assessed at this time Information / Referral to community resources:     Patient/Family's Response to care:  Patient and family are in agreement to having patient return back to [facility]. Patient/Family's Understanding of and Emotional Response to Diagnosis, Current Treatment, and Prognosis:  Patient and family are looking forward to returning back to ALF, they are hopeful he will not have to be at hospital very long.  Emotional Assessment Appearance:  Appears stated age Attitude/Demeanor/Rapport:    Affect (typically observed):  Appropriate, Pleasant, Stable Orientation:  Oriented to Self, Oriented to Place, Oriented to  Time, Oriented to Situation Alcohol / Substance use:  Not Applicable Psych involvement (Current and /or in the community):  No (Comment)  Discharge Needs  Concerns to be addressed:  Care Coordination Readmission within the last 30 days:  No Current discharge risk:  Lives alone Barriers to Discharge:  No Barriers Identified   Ross Ludwig, LCSWA 11/02/2017, 4:02 PM

## 2017-11-02 NOTE — Care Management (Signed)
Daughter [name] would very much like to have palliative follow patient.  Provide her with brochure for [facility]

## 2017-11-02 NOTE — Discharge Summary (Signed)
Benjamin Villarreal at [facility], [place] y.o., DOB [birth year], MRN [id number]. Admission date: 10/30/2017 Discharge Date 11/02/2017 Primary MD Venia Carbon, MD Admitting Physician Harrie Foreman, MD  Admission Diagnosis  Esophageal obstruction [K22.2] Esophageal stricture [K22.2]  Discharge Diagnosis   Active Problems: Chest pain due to esophageal necrosis Odynophagia due to esophageal necrosis inflammation Prostate cancer with history of mantle Vitamin B12 deficiency Skin cancer , Osteoporosis  Hospital Course  The patient with past medical history of metastatic prostate cancer presents to the emergency department with chest pain.  Patient symptoms were thought to be esophageal related.  And he was seen by GI underwent EGD which showed necrosis and inflammation.  He was admitted and kept n.p.o. and then started on clear liquid diet.  GI stated that this could be possibly malignancy related had a CT scan of the chest which showed no other abnormality excepts upper apices of lungs were abnormal with nonspecific findings.  Patient currently will need to stay on a full liquid diet.      Palliative care team can follow patient at the assisted living     Consults  GI  Significant Tests:  See full reports for all details     Ct Chest W Contrast  Result Date: 10/31/2017 CLINICAL DATA:  Chest pain. EGD showed stricture and possible esophageal necrosis. Metastatic prostate cancer. EXAM: CT CHEST WITH CONTRAST TECHNIQUE: Multidetector CT imaging of the chest was performed during intravenous contrast administration. CONTRAST:  42mL OMNIPAQUE IOHEXOL 300 MG/ML  SOLN COMPARISON:  10/30/2017 chest radiograph FINDINGS: Cardiovascular: Coronary, aortic arch, and branch vessel atherosclerotic vascular disease. Ascending aortic aneurysm 4.0 cm in diameter on image 72/2. Borderline cardiomegaly. Mediastinum/Nodes: Distal thoracic esophageal wall thickening along the  lower 12 cm of the esophagus. There is potentially a small amount of periesophageal fluid on image 120/2. No obvious pneumomediastinum or gas in the wall of the esophagus. No particularly enlarged paraesophageal lymph nodes or other significant pathologic adenopathy in the chest. Lungs/Pleura: Peripheral interstitial accentuation in both lungs favoring the lung apices. There is some bandlike atelectasis or scarring in the posterior basal segment right lower lobe along with a calcified peripheral granuloma in the posterior basal segment. Trace right pleural effusion. Mild airway thickening. Upper Abdomen: 5.4 cm fluid density lesion of the right kidney upper pole is probably a cyst and appears homogeneous. Musculoskeletal: Degenerative glenohumeral arthropathy. Thoracic spondylosis and mild thoracic kyphosis. No compelling findings of osseous metastatic disease. Mild dextroconvex thoracic scoliosis. IMPRESSION: 1. Abnormal wall thickening in the lower 12 cm of the thoracic esophagus, potentially from inflammation or tumor. There is potentially some periesophageal edema associated with the lower thoracic esophagus but no pneumomediastinum or gas tracking within the wall of the esophagus is observed. 2. Peripheral interstitial accentuation which seems to favor the lung apices. This is nonspecific and can be seen in a variety of conditions including sarcoidosis, Langerhans cell histiocytosis, pneumoconioses, chronic hypersensitivity pneumonitis, autoimmune conditions and connective tissue disorders, and other conditions. No honeycombing observed. 3. Airway thickening is present, suggesting bronchitis or reactive airways disease. 4.  Aortic Atherosclerosis (ICD10-I70.0).  Coronary atherosclerosis. 5. Ascending aortic aneurysm 4.0 cm in diameter. Recommend annual imaging followup by CTA or MRA. This recommendation follows 2010 ACCF/AHA/AATS/ACR/ASA/SCA/SCAI/SIR/STS/SVM Guidelines for the Diagnosis and Management of  Patients with Thoracic Aortic Disease. Circulation. 2010; 121: Z308-M578 Electronically Signed   By: Van Clines M.D.   On: 10/31/2017 08:17   Ultrasound Renal Complete  Result Date: 10/16/2017  CLINICAL DATA:  LEFT hydronephrosis, follow-up, history prostate cancer EXAM: RENAL / URINARY TRACT ULTRASOUND COMPLETE COMPARISON:  CT abdomen and pelvis 09/21/2017 FINDINGS: Right Kidney: Length: 11.7 cm. Cortical thinning. Upper normal cortical echogenicity. Cyst at upper pole containing a single thin septation, size overall measuring 4.9 x 4.6 x 5.1 cm and corresponding to lesion seen on CT. No additional mass, hydronephrosis or shadowing calcification. Left Kidney: Length: 11.4 cm. Cortical thinning. Upper normal cortical echogenicity. Tiny cyst at inferior pole 9 x 7 x 10 mm corresponding to a tiny cyst on CT, appears too small to correlate with a 15 mm exophytic lesion on the prior CT exam. No definite ultrasound finding is identified at the inferior pole of LEFT kidney to correspond to this previously seen 15 mm nodule. Bladder: Appears normal for degree of bladder distention. IMPRESSION: Minimally complicated upper pole RIGHT renal cyst 5.1 cm greatest size. Tiny 10 mm cyst inferior pole LEFT kidney. No urinary tract calcification or dilatation. An additional intermediate attenuation nodule exophytic at inferior pole LEFT kidney on prior CT exam 15 mm greatest size is not sonographically evident on today's exam; this nodule appears to be slightly larger than was seen on earlier CT from 2018. Further characterization of this lesion by MR imaging with and without contrast recommended to exclude renal neoplasm. Electronically Signed   By: Lavonia Dana M.D.   On: 10/16/2017 17:36   Dg Abdomen Acute W/chest  Result Date: 10/30/2017 CLINICAL DATA:  Chest pain worsened by eating EXAM: DG ABDOMEN ACUTE W/ 1V CHEST COMPARISON:  CT 09/21/2017, chest radiograph 06/04/2015 FINDINGS: Single-view chest demonstrates  chronic interstitial changes. No focal opacity or pleural effusion. Normal cardiomediastinal silhouette with aortic atherosclerosis. No pneumothorax. Supine and upright views of the abdomen demonstrate no free air beneath the diaphragm. Overall nonobstructed gas pattern with mild gas-filled colon. Multiple surgical clips in the low pelvis. IMPRESSION: 1. No radiographic evidence for acute cardiopulmonary abnormality. Chronic interstitial disease 2. Overall nonobstructed gas pattern Electronically Signed   By: Donavan Foil M.D.   On: 10/30/2017 22:16       Today   Subjective:   Benjamin Villarreal patient doing well tolerated diet  Objective:   Blood pressure (!) 154/62, pulse (!) 50, temperature 97.7 F (36.5 C), temperature source Oral, resp. rate 18, height 6\' 2"  (1.88 m), weight 79.7 kg, SpO2 95 %.  .  Intake/Output Summary (Last 24 hours) at 11/02/2017 1420 Last data filed at 11/02/2017 1402 Gross per 24 hour  Intake 120 ml  Output 0 ml  Net 120 ml    Exam VITAL SIGNS: Blood pressure (!) 154/62, pulse (!) 50, temperature 97.7 F (36.5 C), temperature source Oral, resp. rate 18, height 6\' 2"  (1.88 m), weight 79.7 kg, SpO2 95 %.  GENERAL:  84 y.o.-year-old patient lying in the bed with no acute distress.  EYES: Pupils equal, round, reactive to light and accommodation. No scleral icterus. Extraocular muscles intact.  HEENT: Head atraumatic, normocephalic. Oropharynx and nasopharynx clear.  NECK:  Supple, no jugular venous distention. No thyroid enlargement, no tenderness.  LUNGS: Normal breath sounds bilaterally, no wheezing, rales,rhonchi or crepitation. No use of accessory muscles of respiration.  CARDIOVASCULAR: S1, S2 normal. No murmurs, rubs, or gallops.  ABDOMEN: Soft, nontender, nondistended. Bowel sounds present. No organomegaly or mass.  EXTREMITIES: No pedal edema, cyanosis, or clubbing.  NEUROLOGIC: Cranial nerves II through XII are intact. Muscle strength 5/5 in all extremities.  Sensation intact. Gait not checked.  PSYCHIATRIC: The patient is  alert and oriented x 3.  SKIN: No obvious rash, lesion, or ulcer.   Data Review     CBC w Diff:  Lab Results  Component Value Date   WBC 6.5 10/31/2017   HGB 11.8 (L) 10/31/2017   HGB 11.3 (L) 03/07/2014   HCT 34.5 (L) 10/31/2017   HCT 34.2 (L) 03/07/2014   PLT 125 (L) 10/31/2017   PLT 143 (L) 03/07/2014   LYMPHOPCT 24 10/31/2017   LYMPHOPCT 31.9 03/07/2014   MONOPCT 12 10/31/2017   MONOPCT 9.3 03/07/2014   EOSPCT 4 10/31/2017   EOSPCT 6.1 03/07/2014   BASOPCT 1 10/31/2017   BASOPCT 3.3 03/07/2014   CMP:  Lab Results  Component Value Date   NA 131 (L) 10/30/2017   NA 140 02/11/2012   K 4.2 10/30/2017   K 3.9 02/11/2012   CL 99 10/30/2017   CL 108 (H) 02/11/2012   CO2 24 10/30/2017   CO2 25 02/11/2012   BUN 24 (H) 10/30/2017   BUN 12 02/11/2012   CREATININE 0.90 10/30/2017   CREATININE 1.00 03/23/2014   GLU 114 06/30/2016   PROT 7.2 10/30/2017   PROT 7.0 03/07/2014   ALBUMIN 4.0 10/30/2017   ALBUMIN 3.7 03/07/2014   BILITOT 0.8 10/30/2017   BILITOT 0.3 03/07/2014   ALKPHOS 49 10/30/2017   ALKPHOS 69 03/07/2014   AST 23 10/30/2017   AST 16 03/07/2014   ALT 12 10/30/2017   ALT 19 03/07/2014  .  Micro Results Recent Results (from the past 240 hour(s))  MRSA PCR Screening     Status: None   Collection Time: 10/31/17  4:31 AM  Result Value Ref Range Status   MRSA by PCR NEGATIVE NEGATIVE Final    Comment:        The GeneXpert MRSA Assay (FDA approved for NASAL specimens only), is one component of a comprehensive MRSA colonization surveillance program. It is not intended to diagnose MRSA infection nor to guide or monitor treatment for MRSA infections. Performed at [facility], [address] [place], [place] [address]         Code Status Orders  (From admission, onward)         Start     Ordered   10/31/17 0427  Do not attempt resuscitation (DNR)  Continuous     Question Answer Comment  In the event of cardiac or respiratory ARREST Do not call a "code blue"   In the event of cardiac or respiratory ARREST Do not perform Intubation, CPR, defibrillation or ACLS   In the event of cardiac or respiratory ARREST Use medication by any route, position, wound care, and other measures to relive pain and suffering. May use oxygen, suction and manual treatment of airway obstruction as needed for comfort.      10/31/17 0426        Code Status History    Date Active Date Inactive Code Status Order ID Comments User Context   05/06/2017 0135 05/07/2017 1533 Full Code [id number]  Lance Coon, MD ED   05/01/2017 1840 05/04/2017 1949 Full Code [id number]  Nicholes Mango, MD Inpatient    Advance Directive Documentation     Most Recent Value  Type of Advance Directive  Out of facility DNR (pink MOST or yellow form)  Pre-existing out of facility DNR order (yellow form or pink MOST form)  Physician notified to receive inpatient order  "MOST" Form in Place?  -          Follow-up Information  Venia Carbon, MD On 11/18/2017.   Specialties:  Internal Medicine, Pediatrics Why:  Appointment Time:@ 8:15 am is first avaiable Contact information: [place] [address] [phone]        Efrain Sella, MD On 11/10/2017.   Specialty:  Gastroenterology Why:  hosp f/u, Appointment Time: @ 3:30pm Contact information: [place] [address] [phone]           Discharge Medications   Allergies as of 11/02/2017      Reactions   Other    RAW ONIONS- SINUS REACTION      Medication List    STOP taking these medications   clindamycin 150 MG capsule Commonly known as:  CLEOCIN   HYDROcodone-acetaminophen 5-325 MG tablet Commonly known as:  NORCO/VICODIN   ibuprofen 200 MG tablet Commonly known as:  ADVIL,MOTRIN   mupirocin ointment 2 % Commonly known as:  BACTROBAN   traMADol 50 MG  tablet Commonly known as:  ULTRAM     TAKE these medications   acetaminophen 325 MG tablet Commonly known as:  TYLENOL Take 325-650 mg by mouth every 4 (four) hours as needed for fever (general discomfort).   alum & mag hydroxide-simeth 200-200-20 MG/5ML suspension Commonly known as:  MAALOX/MYLANTA Take 30 mLs by mouth every 4 (four) hours as needed for indigestion or heartburn.   aspirin EC 81 MG tablet Take 81 mg by mouth every other day.   Biotin 2.5 MG Caps Take 2.5 mg by mouth daily.   Calcium-D 600-400 MG-UNIT Tabs Take 1 tablet by mouth daily.   carbamide peroxide 6.5 % OTIC solution Commonly known as:  DEBROX 5 drops daily as needed.   cholecalciferol 1000 units tablet Commonly known as:  VITAMIN D Take 1,000 Units by mouth 2 (two) times daily.   ciclopirox 0.77 % cream Commonly known as:  LOPROX Apply topically.   cyclobenzaprine 5 MG tablet Commonly known as:  FLEXERIL Take 5 mg by mouth at bedtime as needed for muscle spasms.   dextromethorphan-guaiFENesin 10-100 MG/5ML liquid Commonly known as:  ROBITUSSIN-DM Take 10 mLs by mouth every 4 (four) hours as needed for cough.   diphenhydrAMINE 25 mg capsule Commonly known as:  BENADRYL Take 25 mg by mouth every 4 (four) hours as needed for itching or allergies.   enzalutamide 40 MG capsule Commonly known as:  XTANDI Take 2 capsules (80 mg total) by mouth daily.   eucerin cream Apply 1 application topically daily.   feeding supplement (ENSURE ENLIVE) Liqd Take 237 mLs by mouth 3 (three) times daily between meals.   fluPHENAZine decanoate 25 MG/ML injection Commonly known as:  PROLIXIN Inject into the muscle.   fluticasone 50 MCG/ACT nasal spray Commonly known as:  FLONASE Place 1 spray into both nostrils daily.   KAOPECTATE 262 MG/15ML suspension Generic drug:  bismuth subsalicylate Take 10 mLs by mouth 3 (three) times daily as needed for diarrhea or loose stools.   latanoprost 0.005 %  ophthalmic solution Commonly known as:  XALATAN Place 1 drop into both eyes at bedtime.   loratadine 10 MG tablet Commonly known as:  CLARITIN Take 20 mg by mouth at bedtime.   nystatin powder Generic drug:  nystatin Apply 1 g topically 2 (two) times daily as needed (rash). Use 1 application twice daily as needed for rash   pantoprazole 40 MG tablet Commonly known as:  PROTONIX Take 1 tablet (40 mg total) by mouth 2 (two) times daily.  penicillin v potassium 500 MG tablet Commonly known as:  VEETID Take 500 mg by mouth 2 (two) times daily.   polyethylene glycol powder powder Commonly known as:  GLYCOLAX/MIRALAX FILL TO LINE (17 GRAMS ), MIX AND DRINK TWICE A DAY AND 8.5 GRAMS AT NOON   sennosides-docusate sodium 8.6-50 MG tablet Commonly known as:  SENOKOT-S Take 4 tablets by mouth every evening.   sodium chloride 0.65 % Soln nasal spray Commonly known as:  OCEAN Place 1-2 sprays into both nostrils 2 (two) times daily as needed for congestion.   triamcinolone cream 0.1 % Commonly known as:  KENALOG Apply 1 application topically daily as needed.   vitamin B-12 1000 MCG tablet Commonly known as:  CYANOCOBALAMIN Take 1,000 mcg by mouth daily.   vitamin C 500 MG tablet Commonly known as:  ASCORBIC ACID Take 500 mg by mouth daily.          Total Time in preparing paper work, data evaluation and todays exam - 40 minutes  Dustin Flock M.D on 11/02/2017 at 2:20 PM [place]  [phone]

## 2017-11-02 NOTE — Plan of Care (Signed)
  Problem: Education: Goal: Knowledge of General Education information will improve Description Including pain rating scale, medication(s)/side effects and non-pharmacologic comfort measures Outcome: Progressing   Problem: Nutrition: Goal: Adequate nutrition will be maintained Outcome: Progressing Note:  Advanced to clear liquid diet, tolerating well this shift   Problem: Pain Managment: Goal: General experience of comfort will improve Outcome: Progressing Note:  Complained of abdominal pain with movement, treated with tylenol.

## 2017-11-03 ENCOUNTER — Telehealth: Payer: Self-pay | Admitting: *Deleted

## 2017-11-03 NOTE — Telephone Encounter (Signed)
Benjamin Villarreal: Please inform patient's daughter that I have reviewed the recent hospitalization EGD report.  I think it is okay to hold off Xtandi at this time.  I will discuss other options with him when he comes and sees [place] next week.

## 2017-11-03 NOTE — Telephone Encounter (Signed)
Call returned to daughter and informed of physician response and she thanked me for calling back

## 2017-11-03 NOTE — Telephone Encounter (Signed)
Daughter called to report that patient is unable to swallow now and thus he cannot take his Xtandi. Asking that something else be ordered for patient. Please advise

## 2017-11-04 ENCOUNTER — Ambulatory Visit: Payer: Medicare Other | Admitting: Internal Medicine

## 2017-11-04 VITALS — BP 138/74 | HR 68 | Temp 98.0°F | Resp 16 | Wt 178.8 lb

## 2017-11-04 DIAGNOSIS — B3781 Candidal esophagitis: Secondary | ICD-10-CM | POA: Diagnosis not present

## 2017-11-04 DIAGNOSIS — R0789 Other chest pain: Secondary | ICD-10-CM

## 2017-11-04 DIAGNOSIS — K222 Esophageal obstruction: Secondary | ICD-10-CM

## 2017-11-04 DIAGNOSIS — K2289 Other specified disease of esophagus: Secondary | ICD-10-CM

## 2017-11-04 DIAGNOSIS — K228 Other specified diseases of esophagus: Secondary | ICD-10-CM | POA: Diagnosis not present

## 2017-11-04 LAB — SURGICAL PATHOLOGY

## 2017-11-04 LAB — CYTOLOGY - NON PAP

## 2017-11-06 ENCOUNTER — Encounter: Payer: Self-pay | Admitting: Internal Medicine

## 2017-11-06 NOTE — Progress Notes (Signed)
Subjective:    Patient ID: Benjamin Villarreal, male    DOB: [birth year], 84 y.o.   MRN: [id number]  HPI  Asked to see resident in appt 215 RN reports persistent RUE swelling. Resident reports it is painful when you touch, and it makes his arm feel heavy. He has pain that radiates toward his shoulder, but he denies shoulder pain. He got 5 days of Lasix, and has taken Ibuprofen with good improvement. He denies numbness, tingling or weakness or RUE.  Review of Systems  Past Medical History:  Diagnosis Date  . Aortic atherosclerosis ([place])    Noted on CT  . Arthritis   . Colon polyps    adenomatous  . History of fracture of tibia   . History of tachycardia    patient unaware of this  . Kyphosis of cervicothoracic region    SEVERE  . Osteoporosis 01/2014   T -3.8 (01/2014)  . Other constipation    chronic  . Prostate cancer [employer]) 1996   surgery then lupron  . Skin cancer, basal cell 2004   surgical resection.  Marland Kitchen Unspecified venous (peripheral) insufficiency   . Vitamin B12 deficiency     Current Outpatient Medications  Medication Sig Dispense Refill  . acetaminophen (TYLENOL) 325 MG tablet Take 325-650 mg by mouth every 4 (four) hours as needed for fever (general discomfort).     Marland Kitchen alum & mag hydroxide-simeth (MAALOX/MYLANTA) 200-200-20 MG/5ML suspension Take 30 mLs by mouth every 4 (four) hours as needed for indigestion or heartburn.    Marland Kitchen aspirin EC 81 MG tablet Take 81 mg by mouth every other day.    . Biotin 2.5 MG CAPS Take 2.5 mg by mouth daily.     Marland Kitchen bismuth subsalicylate (KAOPECTATE) 262 MG/15ML suspension Take 10 mLs by mouth 3 (three) times daily as needed for diarrhea or loose stools.     . Calcium Carbonate-Vitamin D (CALCIUM-D) 600-400 MG-UNIT TABS Take 1 tablet by mouth daily.     . carbamide peroxide (DEBROX) 6.5 % OTIC solution 5 drops daily as needed.    . cholecalciferol (VITAMIN D) 1000 UNITS tablet Take 1,000 Units by mouth 2 (two) times daily.     . ciclopirox  (LOPROX) 0.77 % cream Apply topically.    . cyclobenzaprine (FLEXERIL) 5 MG tablet Take 5 mg by mouth at bedtime as needed for muscle spasms.    Marland Kitchen dextromethorphan-guaiFENesin (ROBITUSSIN-DM) 10-100 MG/5ML liquid Take 10 mLs by mouth every 4 (four) hours as needed for cough.    . diphenhydrAMINE (BENADRYL) 25 mg capsule Take 25 mg by mouth every 4 (four) hours as needed for itching or allergies.    . enzalutamide (XTANDI) 40 MG capsule Take 2 capsules (80 mg total) by mouth daily. 60 capsule 4  . feeding supplement, ENSURE ENLIVE, (ENSURE ENLIVE) LIQD Take 237 mLs by mouth 3 (three) times daily between meals. 237 mL 12  . fluPHENAZine decanoate (PROLIXIN) 25 MG/ML injection Inject into the muscle.    . fluticasone (FLONASE) 50 MCG/ACT nasal spray Place 1 spray into both nostrils daily.    Marland Kitchen latanoprost (XALATAN) 0.005 % ophthalmic solution Place 1 drop into both eyes at bedtime.    Marland Kitchen loratadine (CLARITIN) 10 MG tablet Take 20 mg by mouth at bedtime.    Marland Kitchen nystatin (NYSTATIN) powder Apply 1 g topically 2 (two) times daily as needed (rash). Use 1 application twice daily as needed for rash    . pantoprazole (PROTONIX) 40 MG tablet Take 1 tablet (  40 mg total) by mouth 2 (two) times daily. 60 tablet 0  . penicillin v potassium (VEETID) 500 MG tablet Take 500 mg by mouth 2 (two) times daily.    . polyethylene glycol powder (GLYCOLAX/MIRALAX) powder FILL TO LINE (17 GRAMS ), MIX AND DRINK TWICE A DAY AND 8.5 GRAMS AT NOON 510 g 3  . sennosides-docusate sodium (SENOKOT-S) 8.6-50 MG tablet Take 4 tablets by mouth every evening.     . Skin Protectants, Misc. (EUCERIN) cream Apply 1 application topically daily.    . sodium chloride (OCEAN) 0.65 % SOLN nasal spray Place 1-2 sprays into both nostrils 2 (two) times daily as needed for congestion.    . triamcinolone cream (KENALOG) 0.1 % Apply 1 application topically daily as needed.     . vitamin B-12 (CYANOCOBALAMIN) 1000 MCG tablet Take 1,000 mcg by mouth  daily.    . vitamin C (ASCORBIC ACID) 500 MG tablet Take 500 mg by mouth daily.     No current facility-administered medications for this visit.    Facility-Administered Medications Ordered in Other Visits  Medication Dose Route Frequency Provider Last Rate Last Dose  . leuprolide (LUPRON) injection 30 mg  30 mg Intramuscular Once Leia Alf, MD        Allergies  Allergen Reactions  . Other     RAW ONIONS- SINUS REACTION    Family History  Problem Relation Age of Onset  . Leukemia Mother   . Diabetes Son   . Prostate cancer Neg Hx   . Bladder Cancer Neg Hx   . Kidney cancer Neg Hx     Social History   Socioeconomic History  . Marital status: Widowed    Spouse name: [name]  . Number of children: 3  . Years of education: Not on file  . Highest education level: Not on file  Occupational History  . 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 Financial resource strain: Not on file  . Food insecurity:    Worry: Not on file    Inability: Not on file  . Transportation needs:    Medical: Not on file    Non-medical: Not on file  Tobacco Use  . Smoking status: Never Smoker  . Smokeless tobacco: Never Used  Substance and Sexual Activity  . Alcohol use: No    Alcohol/week: 0.0 - 1.0 standard drinks    Frequency: Never    Comment: in past  . Drug use: No  . Sexual activity: Not Currently  Lifestyle  . Physical activity:    Days per week: Not on file    Minutes per session: Not on file  . Stress: Not on file  Relationships  . Social connections:    Talks on phone: Not on file    Gets together: Not on file    Attends religious service: Not on file    Active member of club or organization: Not on file    Attends meetings of clubs or organizations: Not on file    Relationship status: Not on file  . Intimate partner violence:    Fear of current or ex partner: Not on file    Emotionally abused: Not on file    Physically abused: Not on file     Forced sexual activity: Not on file  Other Topics Concern  . Not on file  Social History Narrative   Has living will   Daughter [name] holds health care POA.   Has  DNR and we have reviewed this.   Would not want tube feedings if cognitively unaware     Constitutional: Denies fever, malaise, fatigue, headache or abrupt weight changes.  Skin: Pt reports swelling of RUE, Denies redness, rashes, lesions or ulcercations.  Neurological: Denies numbness, tingling, weakness of RUE.   No other specific complaints in a complete review of systems (except as listed in HPI above).     Objective:   Physical Exam   BP 125/72   Pulse 75   Temp 98.1 F (36.7 C)   Resp 16  Wt Readings from Last 3 Encounters:  11/02/17 175 lb 9.6 oz (79.7 kg)  10/22/17 184 lb 3.2 oz (83.6 kg)  10/14/17 184 lb (83.5 kg)    General: Appears his stated age, well developed, well nourished in NAD. Skin: Dependent edema of inferior bicep, improved since last visit. No redness or warmth noted. Musculoskeletal: Reasonable internal and external rotation of the right shoulder. No pain with palpation of the shoulder. Neurological: Sensation intact to BUE.   BMET    Component Value Date/Time   NA 131 (L) 10/30/2017 2211   NA 140 02/11/2012 0415   K 4.2 10/30/2017 2211   K 3.9 02/11/2012 0415   CL 99 10/30/2017 2211   CL 108 (H) 02/11/2012 0415   CO2 24 10/30/2017 2211   CO2 25 02/11/2012 0415   GLUCOSE 148 (H) 10/30/2017 2211   GLUCOSE 125 (H) 02/11/2012 0415   BUN 24 (H) 10/30/2017 2211   BUN 12 02/11/2012 0415   CREATININE 0.90 10/30/2017 2211   CREATININE 1.00 03/23/2014 1400   CALCIUM 8.5 (L) 10/30/2017 2211   CALCIUM 8.3 (L) 03/23/2014 1400   GFRNONAA >60 10/30/2017 2211   GFRNONAA >60 03/23/2014 1400   GFRNONAA 49 (L) 09/02/2013 1428   GFRAA >60 10/30/2017 2211   GFRAA >60 03/23/2014 1400   GFRAA 57 (L) 09/02/2013 1428    Lipid Panel  No results found for: CHOL, TRIG, HDL,  CHOLHDL, VLDL, LDLCALC  CBC    Component Value Date/Time   WBC 6.5 10/31/2017 1805   RBC 3.80 (L) 10/31/2017 1805   HGB 11.8 (L) 10/31/2017 1805   HGB 11.3 (L) 03/07/2014 1414   HCT 34.5 (L) 10/31/2017 1805   HCT 34.2 (L) 03/07/2014 1414   PLT 125 (L) 10/31/2017 1805   PLT 143 (L) 03/07/2014 1414   MCV 90.8 10/31/2017 1805   MCV 84 03/07/2014 1414   MCH 31.2 10/31/2017 1805   MCHC 34.3 10/31/2017 1805   RDW 13.8 10/31/2017 1805   RDW 15.2 (H) 03/07/2014 1414   LYMPHSABS 1.6 10/31/2017 1805   LYMPHSABS 1.8 03/07/2014 1414   MONOABS 0.8 10/31/2017 1805   MONOABS 0.5 03/07/2014 1414   EOSABS 0.3 10/31/2017 1805   EOSABS 0.3 03/07/2014 1414   BASOSABS 0.1 10/31/2017 1805   BASOSABS 0.2 (H) 03/07/2014 1414    Hgb A1C No results found for: HGBA1C         Assessment & Plan:   Edema, RUE:  Improving Encouraged elevation Wrap with ACE wrap M-F for compression. Ace wrap on during the day, off at night. Continue Ibuprofen as needed  Will reassess as needed [name], NP

## 2017-11-06 NOTE — Patient Instructions (Signed)
Esophageal Stricture °Esophageal stricture is a condition that causes the esophagus to become narrow. The esophagus is the long tube in your throat that carries food and liquid from your mouth to your stomach. Esophageal stricture can make it difficult, painful, or even impossible to swallow. The condition also makes choking more likely. °What are the causes? °Gastroesophageal reflux disease (GERD) is the most common cause of esophageal stricture. In GERD, stomach acid backs up into the esophagus. Over time, this causes scar tissue and leads to narrowing (stricture). °Other causes of esophageal stricture include: °· Scarring from ingesting a harmful substance. °· Damage from medical instruments used in the esophagus. °· Radiation therapy. °· Cancer. ° °What increases the risk? °You are at greater risk for esophageal stricture if you have GERD or esophageal cancer. °What are the signs or symptoms? °Signs and symptoms of esophageal stricture include: °· Difficulty swallowing. °· Pain when swallowing. °· Heartburn. °· Vomiting or spitting up (regurgitating) food or liquids. °· Weight loss. ° °How is this diagnosed? °Your health care provider may suspect esophageal stricture based on your symptoms. A physical exam will also be done. You may need tests to confirm the diagnosis. These can include: °· Upper endoscopy. Your health care provider will insert a flexible tube with a tiny camera on it (endoscope) into your esophagus to check for a stricture. A tissue sample may also be taken to be examined under a microscope (biopsy). °· Esophageal pH monitoring. This test involves collecting acid in the esophagus with a tube to determine how much stomach acid is entering the esophagus. °· Barium swallow test. For this test, you will drink a barium solution that coats the lining of the esophagus. Then you will have an X-ray taken. The barium solution helps to show if there is stricture. ° °How is this treated? °Treatment for  esophageal stricture depends on what is causing your condition and how severe it is. Treatment options include: °· Esophageal dilatation. In this procedure, a health care provider inserts an endoscope or a tool called a dilator into your esophagus to gently stretch it and make the opening wider. °· Stents. In some cases, your health care provider may place a small device (stent) in the esophagus to keep it open. °· Acid-blocking medicines. Taking these helps manage GERD symptoms after an esophageal stricture. This can prevent the stricture from returning. ° °Follow these instructions at home: °· Do not drink alcohol. °· Do not use any tobacco products, including cigarettes, chewing tobacco, or electronic cigarettes. If you need help quitting, ask your health care provider. °· Lose weight if you are overweight. °· Wear loose, comfortable clothing. °· Do not eat for 3 hours before bedtime. °· Elevate your head in bed with pillows. °· Do not overeat at meals. °· Do not eat foods that make reflux worse. These include: °? Fatty foods. °? Spicy foods. °? Soda. °? Tomato products. °? Chocolate. °Contact a health care provider if: °· You have problems eating or swallowing. °· You regurgitate food and liquid. °This information is not intended to replace advice given to you by your health care provider. Make sure you discuss any questions you have with your health care provider. °Document Released: 10/21/2005 Document Revised: 07/19/2015 Document Reviewed: 06/22/2013 °Elsevier Interactive Patient Education © 2018 [employer] ° °

## 2017-11-06 NOTE — Patient Instructions (Signed)
Edema Edema is when you have too much fluid in your body or under your skin. Edema may make your legs, feet, and ankles swell up. Swelling is also common in looser tissues, like around your eyes. This is a common condition. It gets more common as you get older. There are many possible causes of edema. Eating too much salt (sodium) and being on your feet or sitting for a long time can cause edema in your legs, feet, and ankles. Hot weather may make edema worse. Edema is usually painless. Your skin may look swollen or shiny. Follow these instructions at home:  Keep the swollen body part raised (elevated) above the level of your heart when you are sitting or lying down.  Do not sit still or stand for a long time.  Do not wear tight clothes. Do not wear garters on your upper legs.  Exercise your legs. This can help the swelling go down.  Wear elastic bandages or support stockings as told by your doctor.  Eat a low-salt (low-sodium) diet to reduce fluid as told by your doctor.  Depending on the cause of your swelling, you may need to limit how much fluid you drink (fluid restriction).  Take over-the-counter and prescription medicines only as told by your doctor. Contact a doctor if:  Treatment is not working.  You have heart, liver, or kidney disease and have symptoms of edema.  You have sudden and unexplained weight gain. Get help right away if:  You have shortness of breath or chest pain.  You cannot breathe when you lie down.  You have pain, redness, or warmth in the swollen areas.  You have heart, liver, or kidney disease and get edema all of a sudden.  You have a fever and your symptoms get worse all of a sudden. Summary  Edema is when you have too much fluid in your body or under your skin.  Edema may make your legs, feet, and ankles swell up. Swelling is also common in looser tissues, like around your eyes.  Raise (elevate) the swollen body part above the level of your  heart when you are sitting or lying down.  Follow your doctor's instructions about diet and how much fluid you can drink (fluid restriction). This information is not intended to replace advice given to you by your health care provider. Make sure you discuss any questions you have with your health care provider. Document Released: 07/30/2007 Document Revised: 02/29/2016 Document Reviewed: 02/29/2016 Elsevier Interactive Patient Education  2017 [employer]  

## 2017-11-06 NOTE — Progress Notes (Signed)
Subjective:    Patient ID: Benjamin Villarreal, male    DOB: 1920/09/10, 84 y.o.   MRN: [id number]  HPI  Asked to see resident in [address] for Hospital Follow Up. Went to ER 9/6 with c/o chest pain. Cardiac workup negative. CT scan of the chest showed possible mass. GI consulted, EGD showed esophageal stricture, inflammation, necrosis and candidal infection. He was placed on a full liquid diet. He was started on Pantoprazole BID and advised to follow up with GI as outpatient. He was discharged back to [facility] on 9/9. Since discharge, he reports he has been feeling well. He is still on a liquid diet, able to resume normal diet in 2 days. He has no chest pain, dysphagia, nausea, vomiting or diarrhea.  Review of Systems      Past Medical History:  Diagnosis Date  . Aortic atherosclerosis ([place])    Noted on CT  . Arthritis   . Colon polyps    adenomatous  . History of fracture of tibia   . History of tachycardia    patient unaware of this  . Kyphosis of cervicothoracic region    SEVERE  . Osteoporosis 01/2014   T -3.8 (01/2014)  . Other constipation    chronic  . Prostate cancer [facility]) 1996   surgery then lupron  . Skin cancer, basal cell 2004   surgical resection.  Marland Kitchen Unspecified venous (peripheral) insufficiency   . Vitamin B12 deficiency     Current Outpatient Medications  Medication Sig Dispense Refill  . acetaminophen (TYLENOL) 325 MG tablet Take 325-650 mg by mouth every 4 (four) hours as needed for fever (general discomfort).     Marland Kitchen alum & mag hydroxide-simeth (MAALOX/MYLANTA) 200-200-20 MG/5ML suspension Take 30 mLs by mouth every 4 (four) hours as needed for indigestion or heartburn.    Marland Kitchen aspirin EC 81 MG tablet Take 81 mg by mouth every other day.    . Biotin 2.5 MG CAPS Take 2.5 mg by mouth daily.     Marland Kitchen bismuth subsalicylate (KAOPECTATE) 262 MG/15ML suspension Take 10 mLs by mouth 3 (three) times daily as needed for diarrhea or loose stools.     . Calcium  Carbonate-Vitamin D (CALCIUM-D) 600-400 MG-UNIT TABS Take 1 tablet by mouth daily.     . carbamide peroxide (DEBROX) 6.5 % OTIC solution 5 drops daily as needed.    . cholecalciferol (VITAMIN D) 1000 UNITS tablet Take 1,000 Units by mouth 2 (two) times daily.     . ciclopirox (LOPROX) 0.77 % cream Apply topically.    . cyclobenzaprine (FLEXERIL) 5 MG tablet Take 5 mg by mouth at bedtime as needed for muscle spasms.    Marland Kitchen dextromethorphan-guaiFENesin (ROBITUSSIN-DM) 10-100 MG/5ML liquid Take 10 mLs by mouth every 4 (four) hours as needed for cough.    . diphenhydrAMINE (BENADRYL) 25 mg capsule Take 25 mg by mouth every 4 (four) hours as needed for itching or allergies.    . enzalutamide (XTANDI) 40 MG capsule Take 2 capsules (80 mg total) by mouth daily. 60 capsule 4  . feeding supplement, ENSURE ENLIVE, (ENSURE ENLIVE) LIQD Take 237 mLs by mouth 3 (three) times daily between meals. 237 mL 12  . fluPHENAZine decanoate (PROLIXIN) 25 MG/ML injection Inject into the muscle.    . fluticasone (FLONASE) 50 MCG/ACT nasal spray Place 1 spray into both nostrils daily.    Marland Kitchen latanoprost (XALATAN) 0.005 % ophthalmic solution Place 1 drop into both eyes at bedtime.    Marland Kitchen  loratadine (CLARITIN) 10 MG tablet Take 20 mg by mouth at bedtime.    Marland Kitchen nystatin (NYSTATIN) powder Apply 1 g topically 2 (two) times daily as needed (rash). Use 1 application twice daily as needed for rash    . pantoprazole (PROTONIX) 40 MG tablet Take 1 tablet (40 mg total) by mouth 2 (two) times daily. 60 tablet 0  . penicillin v potassium (VEETID) 500 MG tablet Take 500 mg by mouth 2 (two) times daily.    . polyethylene glycol powder (GLYCOLAX/MIRALAX) powder FILL TO LINE (17 GRAMS ), MIX AND DRINK TWICE A DAY AND 8.5 GRAMS AT NOON 510 g 3  . sennosides-docusate sodium (SENOKOT-S) 8.6-50 MG tablet Take 4 tablets by mouth every evening.     . Skin Protectants, Misc. (EUCERIN) cream Apply 1 application topically daily.    . sodium chloride  (OCEAN) 0.65 % SOLN nasal spray Place 1-2 sprays into both nostrils 2 (two) times daily as needed for congestion.    . triamcinolone cream (KENALOG) 0.1 % Apply 1 application topically daily as needed.     . vitamin B-12 (CYANOCOBALAMIN) 1000 MCG tablet Take 1,000 mcg by mouth daily.    . vitamin C (ASCORBIC ACID) 500 MG tablet Take 500 mg by mouth daily.     No current facility-administered medications for this visit.    Facility-Administered Medications Ordered in Other Visits  Medication Dose Route Frequency Provider Last Rate Last Dose  . leuprolide (LUPRON) injection 30 mg  30 mg Intramuscular Once Leia Alf, MD        Allergies  Allergen Reactions  . Other     RAW ONIONS- SINUS REACTION    Family History  Problem Relation Age of Onset  . Leukemia Mother   . Diabetes Son   . Prostate cancer Neg Hx   . Bladder Cancer Neg Hx   . Kidney cancer Neg Hx     Social History   Socioeconomic History  . Marital status: Widowed    Spouse name: [name]  . Number of children: 3  . Years of education: Not on file  . Highest education level: Not on file  Occupational History  . 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 Financial resource strain: Not on file  . Food insecurity:    Worry: Not on file    Inability: Not on file  . Transportation needs:    Medical: Not on file    Non-medical: Not on file  Tobacco Use  . Smoking status: Never Smoker  . Smokeless tobacco: Never Used  Substance and Sexual Activity  . Alcohol use: No    Alcohol/week: 0.0 - 1.0 standard drinks    Frequency: Never    Comment: in past  . Drug use: No  . Sexual activity: Not Currently  Lifestyle  . Physical activity:    Days per week: Not on file    Minutes per session: Not on file  . Stress: Not on file  Relationships  . Social connections:    Talks on phone: Not on file    Gets together: Not on file    Attends religious service: Not on file    Active member of  club or organization: Not on file    Attends meetings of clubs or organizations: Not on file    Relationship status: Not on file  . Intimate partner violence:    Fear of current or ex partner: Not on file    Emotionally  abused: Not on file    Physically abused: Not on file    Forced sexual activity: Not on file  Other Topics Concern  . Not on file  Social History Narrative   Has living will   Daughter [name] holds health care POA.   Has DNR and we have reviewed this.   Would not want tube feedings if cognitively unaware     Constitutional: Denies fever, malaise, fatigue, headache or abrupt weight changes.  Respiratory: Denies difficulty breathing, shortness of breath, cough or sputum production.   Cardiovascular: Denies chest pain, chest tightness, palpitations or swelling in the hands or feet.  Gastrointestinal: Denies abdominal pain, bloating, constipation, diarrhea or blood in the stool.   No other specific complaints in a complete review of systems (except as listed in HPI above).  Objective:   Physical Exam  BP 138/74   Pulse 68   Temp 98 F (36.7 C)   Resp 16   Wt 178 lb 12.8 oz (81.1 kg)   BMI 22.96 kg/m  Wt Readings from Last 3 Encounters:  11/06/17 178 lb 12.8 oz (81.1 kg)  11/02/17 175 lb 9.6 oz (79.7 kg)  10/22/17 184 lb 3.2 oz (83.6 kg)    General: Appears his stated age, well developed, well nourished in NAD. Cardiovascular: Normal rate and rhythm. S1,S2 noted.  No murmur, rubs or gallops noted.  Pulmonary/Chest: Normal effort and positive vesicular breath sounds. No respiratory distress. No wheezes, rales or ronchi noted.  Abdomen: Soft and nontender. Normal bowel sounds. No distention or masses noted.  Neurological: Alert and oriented.    BMET    Component Value Date/Time   NA 131 (L) 10/30/2017 2211   NA 140 02/11/2012 0415   K 4.2 10/30/2017 2211   K 3.9 02/11/2012 0415   CL 99 10/30/2017 2211   CL 108 (H) 02/11/2012 0415   CO2 24  10/30/2017 2211   CO2 25 02/11/2012 0415   GLUCOSE 148 (H) 10/30/2017 2211   GLUCOSE 125 (H) 02/11/2012 0415   BUN 24 (H) 10/30/2017 2211   BUN 12 02/11/2012 0415   CREATININE 0.90 10/30/2017 2211   CREATININE 1.00 03/23/2014 1400   CALCIUM 8.5 (L) 10/30/2017 2211   CALCIUM 8.3 (L) 03/23/2014 1400   GFRNONAA >60 10/30/2017 2211   GFRNONAA >60 03/23/2014 1400   GFRNONAA 49 (L) 09/02/2013 1428   GFRAA >60 10/30/2017 2211   GFRAA >60 03/23/2014 1400   GFRAA 57 (L) 09/02/2013 1428    Lipid Panel  No results found for: CHOL, TRIG, HDL, CHOLHDL, VLDL, LDLCALC  CBC    Component Value Date/Time   WBC 6.5 10/31/2017 1805   RBC 3.80 (L) 10/31/2017 1805   HGB 11.8 (L) 10/31/2017 1805   HGB 11.3 (L) 03/07/2014 1414   HCT 34.5 (L) 10/31/2017 1805   HCT 34.2 (L) 03/07/2014 1414   PLT 125 (L) 10/31/2017 1805   PLT 143 (L) 03/07/2014 1414   MCV 90.8 10/31/2017 1805   MCV 84 03/07/2014 1414   MCH 31.2 10/31/2017 1805   MCHC 34.3 10/31/2017 1805   RDW 13.8 10/31/2017 1805   RDW 15.2 (H) 03/07/2014 1414   LYMPHSABS 1.6 10/31/2017 1805   LYMPHSABS 1.8 03/07/2014 1414   MONOABS 0.8 10/31/2017 1805   MONOABS 0.5 03/07/2014 1414   EOSABS 0.3 10/31/2017 1805   EOSABS 0.3 03/07/2014 1414   BASOSABS 0.1 10/31/2017 1805   BASOSABS 0.2 (H) 03/07/2014 1414    Hgb A1C No results found for: HGBA1C  Assessment & Plan:   Hospital Follow Up for Chest Pain, Esophageal Stricture, Necrosis and Candidal Infection:  Hospital notes, labs and imaging reviewed Continue full liquid diet then advance to normal diet on Friday Continue Pantoprazole BID Will treat candidal infection with Fluconazole 100 mg x 7 days Follow up with GI as scheduled  Will reassess as needed [name], NP

## 2017-11-10 DIAGNOSIS — K228 Other specified diseases of esophagus: Secondary | ICD-10-CM | POA: Diagnosis not present

## 2017-11-10 DIAGNOSIS — K209 Esophagitis, unspecified: Secondary | ICD-10-CM | POA: Diagnosis not present

## 2017-11-10 DIAGNOSIS — C61 Malignant neoplasm of prostate: Secondary | ICD-10-CM | POA: Diagnosis not present

## 2017-11-10 DIAGNOSIS — K222 Esophageal obstruction: Secondary | ICD-10-CM | POA: Diagnosis not present

## 2017-11-11 ENCOUNTER — Ambulatory Visit: Payer: Medicare Other | Admitting: Internal Medicine

## 2017-11-11 ENCOUNTER — Ambulatory Visit: Payer: Medicare Other

## 2017-11-11 ENCOUNTER — Other Ambulatory Visit: Payer: Medicare Other

## 2017-11-11 DIAGNOSIS — L821 Other seborrheic keratosis: Secondary | ICD-10-CM | POA: Diagnosis not present

## 2017-11-11 DIAGNOSIS — L578 Other skin changes due to chronic exposure to nonionizing radiation: Secondary | ICD-10-CM | POA: Diagnosis not present

## 2017-11-11 DIAGNOSIS — D485 Neoplasm of uncertain behavior of skin: Secondary | ICD-10-CM | POA: Diagnosis not present

## 2017-11-11 DIAGNOSIS — L820 Inflamed seborrheic keratosis: Secondary | ICD-10-CM | POA: Diagnosis not present

## 2017-11-11 NOTE — Progress Notes (Signed)
Patient did not present with, develop nor was discharged with a diagnosis of esophageal perforation.  Esophageal perforation did not occur before during or after the patient's discharge.  Perforation was listed as a potential risk of the procedure only.

## 2017-11-12 ENCOUNTER — Inpatient Hospital Stay (HOSPITAL_BASED_OUTPATIENT_CLINIC_OR_DEPARTMENT_OTHER): Payer: Medicare Other | Admitting: Internal Medicine

## 2017-11-12 ENCOUNTER — Other Ambulatory Visit: Payer: Self-pay

## 2017-11-12 ENCOUNTER — Telehealth: Payer: Self-pay | Admitting: Pharmacist

## 2017-11-12 ENCOUNTER — Inpatient Hospital Stay: Payer: Medicare Other | Attending: Internal Medicine

## 2017-11-12 ENCOUNTER — Telehealth: Payer: Self-pay | Admitting: Pharmacy Technician

## 2017-11-12 ENCOUNTER — Encounter: Payer: Self-pay | Admitting: Internal Medicine

## 2017-11-12 VITALS — BP 111/62 | HR 69 | Temp 97.7°F | Resp 22 | Ht 74.0 in | Wt 177.7 lb

## 2017-11-12 DIAGNOSIS — R001 Bradycardia, unspecified: Secondary | ICD-10-CM

## 2017-11-12 DIAGNOSIS — N1330 Unspecified hydronephrosis: Secondary | ICD-10-CM | POA: Insufficient documentation

## 2017-11-12 DIAGNOSIS — K5909 Other constipation: Secondary | ICD-10-CM | POA: Insufficient documentation

## 2017-11-12 DIAGNOSIS — K222 Esophageal obstruction: Secondary | ICD-10-CM

## 2017-11-12 DIAGNOSIS — M810 Age-related osteoporosis without current pathological fracture: Secondary | ICD-10-CM | POA: Insufficient documentation

## 2017-11-12 DIAGNOSIS — E538 Deficiency of other specified B group vitamins: Secondary | ICD-10-CM | POA: Diagnosis not present

## 2017-11-12 DIAGNOSIS — Z86010 Personal history of colonic polyps: Secondary | ICD-10-CM

## 2017-11-12 DIAGNOSIS — L03115 Cellulitis of right lower limb: Secondary | ICD-10-CM | POA: Insufficient documentation

## 2017-11-12 DIAGNOSIS — D509 Iron deficiency anemia, unspecified: Secondary | ICD-10-CM | POA: Diagnosis not present

## 2017-11-12 DIAGNOSIS — C61 Malignant neoplasm of prostate: Secondary | ICD-10-CM

## 2017-11-12 DIAGNOSIS — R1310 Dysphagia, unspecified: Secondary | ICD-10-CM

## 2017-11-12 DIAGNOSIS — I700 Atherosclerosis of aorta: Secondary | ICD-10-CM | POA: Insufficient documentation

## 2017-11-12 DIAGNOSIS — Z192 Hormone resistant malignancy status: Secondary | ICD-10-CM | POA: Diagnosis not present

## 2017-11-12 DIAGNOSIS — Z7982 Long term (current) use of aspirin: Secondary | ICD-10-CM

## 2017-11-12 DIAGNOSIS — Z79899 Other long term (current) drug therapy: Secondary | ICD-10-CM

## 2017-11-12 DIAGNOSIS — Z85828 Personal history of other malignant neoplasm of skin: Secondary | ICD-10-CM | POA: Insufficient documentation

## 2017-11-12 DIAGNOSIS — C778 Secondary and unspecified malignant neoplasm of lymph nodes of multiple regions: Secondary | ICD-10-CM | POA: Insufficient documentation

## 2017-11-12 LAB — COMPREHENSIVE METABOLIC PANEL
ALT: 12 U/L (ref 0–44)
ANION GAP: 8 (ref 5–15)
AST: 22 U/L (ref 15–41)
Albumin: 4 g/dL (ref 3.5–5.0)
Alkaline Phosphatase: 48 U/L (ref 38–126)
BUN: 22 mg/dL (ref 8–23)
CO2: 25 mmol/L (ref 22–32)
Calcium: 9 mg/dL (ref 8.9–10.3)
Chloride: 101 mmol/L (ref 98–111)
Creatinine, Ser: 1.07 mg/dL (ref 0.61–1.24)
GFR calc non Af Amer: 57 mL/min — ABNORMAL LOW (ref >60)
Glucose, Bld: 152 mg/dL — ABNORMAL HIGH (ref 70–99)
Potassium: 4.3 mmol/L (ref 3.5–5.1)
SODIUM: 134 mmol/L — AB (ref 135–145)
Total Bilirubin: 0.5 mg/dL (ref 0.3–1.2)
Total Protein: 7.3 g/dL (ref 6.5–8.1)

## 2017-11-12 LAB — CBC WITH DIFFERENTIAL/PLATELET
Basophils Absolute: 0.2 10*3/uL — ABNORMAL HIGH (ref 0–0.1)
Basophils Relative: 3 %
EOS ABS: 0.3 10*3/uL (ref 0–0.7)
EOS PCT: 5 %
HCT: 36.5 % — ABNORMAL LOW (ref 40.0–52.0)
Hemoglobin: 12.5 g/dL — ABNORMAL LOW (ref 13.0–18.0)
LYMPHS ABS: 1.7 10*3/uL (ref 1.0–3.6)
Lymphocytes Relative: 30 %
MCH: 31 pg (ref 26.0–34.0)
MCHC: 34.2 g/dL (ref 32.0–36.0)
MCV: 90.7 fL (ref 80.0–100.0)
Monocytes Absolute: 0.5 10*3/uL (ref 0.2–1.0)
Monocytes Relative: 10 %
Neutro Abs: 2.9 10*3/uL (ref 1.4–6.5)
Neutrophils Relative %: 52 %
PLATELETS: 158 10*3/uL (ref 150–440)
RBC: 4.02 MIL/uL — AB (ref 4.40–5.90)
RDW: 13.5 % (ref 11.5–14.5)
WBC: 5.6 10*3/uL (ref 3.8–10.6)

## 2017-11-12 LAB — PSA: Prostatic Specific Antigen: 0.99 ng/mL (ref 0.00–4.00)

## 2017-11-12 MED ORDER — ABIRATERONE ACETATE 250 MG PO TABS: 250.0000 mg | ORAL_TABLET | Freq: Every day | ORAL | 2 refills | Status: DC

## 2017-11-12 NOTE — Progress Notes (Signed)
Recently EGD and dx with esophogeal stricture/obtruction.  Patient unable to swallow pills. Pt reports dysphagia "but tries to chew his foods well." Denies any food getting stuck in throat. He is able to swallow po fluids.

## 2017-11-12 NOTE — Assessment & Plan Note (Addendum)
#   CASTRATE RESISTANT PROSTATE CANCER- Metastatic disease with left bladder invasion/obstruction.  Patient had significant improvement in his pelvic adenopathy on the CT scan on Xtandi.  However see discussion below  #Currently [name] is off because of his difficulty swallowing with the pill esophagitis.   #Recommend starting Zytiga 1 pill a day with low-fat meal.  Prescription started.  # Severe osteoporosis: Currently on Prolia; plan today.  Stable  #Left ureteral stent placement/hydronephrosis: Status post exchange.  Followed by urology.  # Iron deficiency anemia: Improved stable.  Hold off the iron pill because of recent pill esophagitis.  If needed we will recommend IV iron infusion.  #Difficulty swallowing-pill esophagitis improved.  #Right lower extremity cellulitis/dermatitis chronic- status post antibiotics/Lasix.  Stable.  #Follow-up in last week of October/labs Lupron; CBC CMP PSA

## 2017-11-12 NOTE — Telephone Encounter (Signed)
Oral Oncology Pharmacist Encounter  Received new prescription for Zytiga (abiraterone) for the treatment of metastatic castrate resistant prostate cancer, planned duration until disease progression or unacceptable drug toxicity.  CMP/BP from 11/12/17 assessed, no relevant lab abnormalities and BP well-controlled. Prescription dose and frequency assessed. Patient is switching from [place] due to swallowing difficulty, hoping that smaller pill size of Zytiga is easier to swallow. Additionally, there is information available on 1 tablet of Zytiga with low fat meal is equivalent to 4 tablets of Zytiga on empty stomach. The plan for Benjamin Villarreal is to take 1 tablet to decrease pill burden.   Current medication list in Epic reviewed, no DDIs with Zytiga identified: - Of note, when doing a med rec for the patient I investigated the Prolixin injection listed in his medication list, because this could have been a potential DDI. After speaking to the patient's daughter and his nurse at his assisted living facility, it was clarified that he was not receiving a Prolixin injection. This was likely a sound alike look alike mix up for the Prolia injection he is receiving. Prolixin was removed from his med list and Prolia was added.   Prescription has been e-scribed to the [facility] for benefits analysis and approval.  Patient education Counseled patient and his daughter on administration, dosing, side effects, monitoring, drug-food interactions, safe handling, storage, and disposal. Patient will take 1 tablet (250 mg total) by mouth daily. Take with a low fat meal.  Side effects include but not limited to: fatigue, decreased white blood cells, hot flashes.    Reviewed with patient importance of keeping a medication schedule and plan for any missed doses.  Benjamin Villarreal and his daughter voiced understanding and appreciation. All questions answered. Medication handout provided and consent  obtained.  Oral Oncology Clinic will continue to follow for insurance authorization, copayment issues, and start date.  Provided patient with [facility] phone number. Patient knows to call the office with questions or concerns. Oral Chemotherapy Navigation Clinic will continue to follow.  Darl Pikes, PharmD, BCPS, [facility] Hematology/Oncology Clinical Pharmacist [facility] [phone]  11/12/2017 1:36 PM

## 2017-11-12 NOTE — Telephone Encounter (Signed)
Oral Oncology Patient Advocate Encounter  Received notification from [employer] that prior authorization for Fabio Asa is required.  PA submitted on CoverMyMeds Key [id number] Status is pending  Oral Oncology Clinic will continue to follow.  [place] [facility] [place] Phone [phone] Fax [phone] 11/12/2017 4:23 PM

## 2017-11-12 NOTE — Progress Notes (Signed)
[place] OFFICE PROGRESS NOTE  Patient Care Team: Venia Carbon, MD as PCP - General  Cancer Staging No matching staging information was found for the patient.   Oncology History    # 1996 PROSTATE CANCER [Gleason score 2+3] s/p Radical Prostatectomy [[facility]]; ?Biochem RECURRENT PROSTATE CANCER-Hormone sensitive;  Intermittent Lupron; on Lupron q 62M [Jan 2016 PSA- 7.9].  HOLD LUPRON in MAY 2017; AUg 2017- Re-start  # OCT 2018- Xtandi 3 pills a day; OCT 30th PET- Retroperitoneal/pelvic adenopathy/recurrence lateral bladder wall; NO Bone lesions; SEP 2019- STOP X-tandi sec difficulty swallowing  # sep 19th START Zytiga 1 pill/day   # SEP 2019- Dysphagia/pill esophagitis [s/p EGD Dr.Toledo]  # Osteoporosis [BMD- 2015]- on Reclast q 67M; FEB 2017- Start Prolia q 6 M -------------------------------------------------------------   DIAGNOSIS: Recurrent metastatic prostate cancer castrate resistant  STAGE:   4      ;GOALS: Palliative  CURRENT/MOST RECENT THERAPY : Zytiga [ordered sep19th]      Prostate cancer ([place])      INTERVAL HISTORY:  Benjamin Villarreal 84 y.o.  male pleasant patient above history of metastatic castrate resistant prostate cancer is here for follow-up.  Patient was recently admitted the hospital for worsening difficulty swallowing.  Patient had a EGD-negative for malignancy positive for stricture benign; question pill esophagitis.  Patient has stopped taking Xtandi because of difficulty swallowing.  His bilateral extremities swelling improved.  Review of Systems  Constitutional: Positive for malaise/fatigue. Negative for chills, diaphoresis, fever and weight loss.  HENT: Negative for nosebleeds and sore throat.   Eyes: Negative for double vision.  Respiratory: Negative for cough, hemoptysis, sputum production, shortness of breath and wheezing.   Cardiovascular: Negative for chest pain, palpitations and orthopnea.  Gastrointestinal: Negative for  abdominal pain, blood in stool, constipation, diarrhea, heartburn, melena, nausea and vomiting.  Genitourinary: Negative for dysuria, frequency and urgency.  Musculoskeletal: Negative for back pain and joint pain.  Skin: Negative.  Negative for itching and rash.  Neurological: Negative for dizziness, tingling, focal weakness, weakness and headaches.  Endo/Heme/Allergies: Does not bruise/bleed easily.  Psychiatric/Behavioral: Negative for depression. The patient is not nervous/anxious and does not have insomnia.       PAST MEDICAL HISTORY :  Past Medical History:  Diagnosis Date  . Aortic atherosclerosis ([place])    Noted on CT  . Arthritis   . Colon polyps    adenomatous  . History of fracture of tibia   . History of tachycardia    patient unaware of this  . Kyphosis of cervicothoracic region    SEVERE  . Osteoporosis 01/2014   T -3.8 (01/2014)  . Other constipation    chronic  . Prostate cancer [facility]) 1996   surgery then lupron  . Skin cancer, basal cell 2004   surgical resection.  Marland Kitchen Unspecified venous (peripheral) insufficiency   . Vitamin B12 deficiency     PAST SURGICAL HISTORY :   Past Surgical History:  Procedure Laterality Date  . BASAL CELL CARCINOMA EXCISION  2004   Left side of face  . CATARACT EXTRACTION Bilateral 2000, 2003   both eyes  . COLONOSCOPY     h/o adenomatous polyps  . CYSTOSCOPY W/ RETROGRADES Bilateral 11/19/2016   Procedure: CYSTOSCOPY WITH RETROGRADE PYELOGRAM;  Surgeon: Hollice Espy, MD;  Location: [facility] OR[facility];  Service: Urology;  Laterality: Bilateral;  General vs. Spinal  . CYSTOSCOPY W/ URETERAL STENT PLACEMENT Left 02/25/2017   Procedure: CYSTOSCOPY WITH STENT REPLACEMENT;  Surgeon: Hollice Espy, MD;  Location: [facility] OR[facility];  Service: Urology;  Laterality: Left;  . CYSTOSCOPY WITH URETEROSCOPY AND STENT PLACEMENT Left 11/19/2016   Procedure: CYSTOSCOPY WITH URETEROSCOPY AND STENT PLACEMENT;  Surgeon: Hollice Espy, MD;  Location: [facility] OR[facility];   Service: Urology;  Laterality: Left;  . ESOPHAGOGASTRODUODENOSCOPY N/A 10/31/2017   Procedure: ESOPHAGOGASTRODUODENOSCOPY (EGD) with biopsy and brushings;  Surgeon: Toledo, Benay Pike, MD;  Location: [facility];  Service: Gastroenterology;  Laterality: N/A;  . FRACTURE SURGERY    . JOINT REPLACEMENT  2005   Left knee  . JOINT REPLACEMENT  12/13   Right knee  . PROSTATE SURGERY  1996   cancer  . sigmoid resection    . TIBIA FRACTURE SURGERY  1999  . URETERAL BIOPSY Left 11/19/2016   Procedure: URETERAL BIOPSY;  Surgeon: Hollice Espy, MD;  Location: [facility] OR[facility];  Service: Urology;  Laterality: Left;  Marland Kitchen VOLVULUS REDUCTION  12/10    FAMILY HISTORY :   Family History  Problem Relation Age of Onset  . Leukemia Mother   . Diabetes Son   . Prostate cancer Neg Hx   . Bladder Cancer Neg Hx   . Kidney cancer Neg Hx     SOCIAL HISTORY:   Social History   Tobacco Use  . Smoking status: Never Smoker  . Smokeless tobacco: Never Used  Substance Use Topics  . Alcohol use: No    Alcohol/week: 0.0 - 1.0 standard drinks    Frequency: Never    Comment: in past  . Drug use: No    ALLERGIES:  is allergic to other.  MEDICATIONS:  Current Outpatient Medications  Medication Sig Dispense Refill  . aspirin EC 81 MG tablet Take 81 mg by mouth every other day.    . fluticasone (FLONASE) 50 MCG/ACT nasal spray Place 1 spray into both nostrils daily.    . furosemide (LASIX) 40 MG tablet Take 1 tablet by mouth daily.    Marland Kitchen latanoprost (XALATAN) 0.005 % ophthalmic solution Place 1 drop into both eyes at bedtime.    . pantoprazole (PROTONIX) 40 MG tablet Take 1 tablet (40 mg total) by mouth 2 (two) times daily. 60 tablet 0  . penicillin v potassium (VEETID) 500 MG tablet Take 500 mg by mouth 2 (two) times daily.    . polyethylene glycol powder (GLYCOLAX/MIRALAX) powder FILL TO LINE (17 GRAMS ), MIX AND DRINK TWICE A DAY AND 8.5 GRAMS AT NOON 510 g 3  . sennosides-docusate sodium (SENOKOT-S) 8.6-50  MG tablet Take 4 tablets by mouth every evening.     . vitamin B-12 (CYANOCOBALAMIN) 1000 MCG tablet Take 1,000 mcg by mouth daily.    Marland Kitchen acetaminophen (TYLENOL) 325 MG tablet Take 325-650 mg by mouth every 4 (four) hours as needed for fever (general discomfort).     Marland Kitchen alum & mag hydroxide-simeth (MAALOX/MYLANTA) 200-200-20 MG/5ML suspension Take 30 mLs by mouth every 4 (four) hours as needed for indigestion or heartburn.    . Biotin 2.5 MG CAPS Take 2.5 mg by mouth daily.     Marland Kitchen bismuth subsalicylate (KAOPECTATE) 262 MG/15ML suspension Take 10 mLs by mouth 3 (three) times daily as needed for diarrhea or loose stools.     . Calcium Carbonate-Vitamin D (CALCIUM-D) 600-400 MG-UNIT TABS Take 1 tablet by mouth daily.     . carbamide peroxide (DEBROX) 6.5 % OTIC solution 5 drops daily as needed.    . diphenhydrAMINE (BENADRYL) 25 mg capsule Take 25 mg by mouth every 4 (four) hours as needed for itching or  allergies.    . enzalutamide (XTANDI) 40 MG capsule Take 2 capsules (80 mg total) by mouth daily. (Patient not taking: Reported on 11/12/2017) 60 capsule 4  . fluPHENAZine decanoate (PROLIXIN) 25 MG/ML injection Inject into the muscle.    . triamcinolone cream (KENALOG) 0.1 % Apply 1 application topically daily as needed.      No current facility-administered medications for this visit.    Facility-Administered Medications Ordered in Other Visits  Medication Dose Route Frequency Provider Last Rate Last Dose  . leuprolide (LUPRON) injection 30 mg  30 mg Intramuscular Once Leia Alf, MD        PHYSICAL EXAMINATION: ECOG PERFORMANCE STATUS: 2 - Symptomatic, <50% confined to bed  BP 111/62 (Patient Position: Sitting)   Pulse 69   Temp 97.7 F (36.5 C) (Oral)   Resp (!) 22   Ht 6\' 2"  (1.88 m)   Wt 177 lb 11.2 oz (80.6 kg)   BMI 22.82 kg/m   Filed Weights   11/12/17 1045  Weight: 177 lb 11.2 oz (80.6 kg)    Physical Exam  Constitutional: He is oriented to person, place, and time and  well-developed, well-nourished, and in no distress.  Elderly Caucasian male patient.  He walks with a walker.  Accompanied by his daughter.  HENT:  Head: Normocephalic and atraumatic.  Mouth/Throat: Oropharynx is clear and moist. No oropharyngeal exudate.  Eyes: Pupils are equal, round, and reactive to light.  Neck: Normal range of motion. Neck supple.  Cardiovascular: Normal rate and regular rhythm.  Pulmonary/Chest: No respiratory distress. He has no wheezes.  Abdominal: Soft. Bowel sounds are normal. He exhibits no distension and no mass. There is no tenderness. There is no rebound and no guarding.  Musculoskeletal: Normal range of motion. He exhibits no tenderness.  Neurological: He is alert and oriented to person, place, and time.  Skin: Skin is warm.  Psychiatric: Affect normal.       LABORATORY DATA:  I have reviewed the data as listed    Component Value Date/Time   NA 134 (L) 11/12/2017 1023   NA 140 02/11/2012 0415   K 4.3 11/12/2017 1023   K 3.9 02/11/2012 0415   CL 101 11/12/2017 1023   CL 108 (H) 02/11/2012 0415   CO2 25 11/12/2017 1023   CO2 25 02/11/2012 0415   GLUCOSE 152 (H) 11/12/2017 1023   GLUCOSE 125 (H) 02/11/2012 0415   BUN 22 11/12/2017 1023   BUN 12 02/11/2012 0415   CREATININE 1.07 11/12/2017 1023   CREATININE 1.00 03/23/2014 1400   CALCIUM 9.0 11/12/2017 1023   CALCIUM 8.3 (L) 03/23/2014 1400   PROT 7.3 11/12/2017 1023   PROT 7.0 03/07/2014 1414   ALBUMIN 4.0 11/12/2017 1023   ALBUMIN 3.7 03/07/2014 1414   AST 22 11/12/2017 1023   AST 16 03/07/2014 1414   ALT 12 11/12/2017 1023   ALT 19 03/07/2014 1414   ALKPHOS 48 11/12/2017 1023   ALKPHOS 69 03/07/2014 1414   BILITOT 0.5 11/12/2017 1023   BILITOT 0.3 03/07/2014 1414   GFRNONAA 57 (L) 11/12/2017 1023   GFRNONAA >60 03/23/2014 1400   GFRNONAA 49 (L) 09/02/2013 1428   GFRAA >60 11/12/2017 1023   GFRAA >60 03/23/2014 1400   GFRAA 57 (L) 09/02/2013 1428    No results found for: SPEP,  UPEP  Lab Results  Component Value Date   WBC 5.6 11/12/2017   NEUTROABS 2.9 11/12/2017   HGB 12.5 (L) 11/12/2017   HCT 36.5 (L) 11/12/2017  MCV 90.7 11/12/2017   PLT 158 11/12/2017      Chemistry      Component Value Date/Time   NA 134 (L) 11/12/2017 1023   NA 140 02/11/2012 0415   K 4.3 11/12/2017 1023   K 3.9 02/11/2012 0415   CL 101 11/12/2017 1023   CL 108 (H) 02/11/2012 0415   CO2 25 11/12/2017 1023   CO2 25 02/11/2012 0415   BUN 22 11/12/2017 1023   BUN 12 02/11/2012 0415   CREATININE 1.07 11/12/2017 1023   CREATININE 1.00 03/23/2014 1400   GLU 114 06/30/2016      Component Value Date/Time   CALCIUM 9.0 11/12/2017 1023   CALCIUM 8.3 (L) 03/23/2014 1400   ALKPHOS 48 11/12/2017 1023   ALKPHOS 69 03/07/2014 1414   AST 22 11/12/2017 1023   AST 16 03/07/2014 1414   ALT 12 11/12/2017 1023   ALT 19 03/07/2014 1414   BILITOT 0.5 11/12/2017 1023   BILITOT 0.3 03/07/2014 1414       RADIOGRAPHIC STUDIES: I have personally reviewed the radiological images as listed and agreed with the findings in the report. No results found.   ASSESSMENT & PLAN:  Prostate cancer ([place]) # CASTRATE RESISTANT PROSTATE CANCER- Metastatic disease with left bladder invasion/obstruction.  Patient had significant improvement in his pelvic adenopathy on the CT scan on Xtandi.  However see discussion below  #Currently [name] is off because of his difficulty swallowing with the pill esophagitis.   #Recommend starting Zytiga 1 pill a day with low-fat meal.  Prescription started.  # Severe osteoporosis: Currently on Prolia; plan today.  Stable  #Left ureteral stent placement/hydronephrosis: Status post exchange.  Followed by urology.  # Iron deficiency anemia: Improved stable.  Hold off the iron pill because of recent pill esophagitis.  If needed we will recommend IV iron infusion.  #Difficulty swallowing-pill esophagitis improved.  #Right lower extremity cellulitis/dermatitis chronic-  status post antibiotics/Lasix.  Stable.  #Follow-up in last week of October/labs Lupron; CBC CMP PSA   Orders Placed This Encounter  Procedures  . Comprehensive metabolic panel    Standing Status:   Future    Standing Expiration Date:   11/13/2018  . CBC with Differential    Standing Status:   Future    Standing Expiration Date:   11/13/2018  . Ferritin    Standing Status:   Future    Standing Expiration Date:   11/13/2018  . Iron and TIBC    Standing Status:   Future    Standing Expiration Date:   11/13/2018  . PSA    Standing Status:   Future    Standing Expiration Date:   11/13/2018   All questions were answered. The patient knows to call the clinic with any problems, questions or concerns.      Cammie Sickle, MD 11/12/2017 1:31 PM

## 2017-11-13 MED FILL — ABIRATERONE ACETATE 250 MG: 250 | 30 days supply | Qty: 30 | Fill #0

## 2017-11-13 NOTE — Telephone Encounter (Signed)
Oral Oncology Patient Advocate Encounter  Prior Authorization for Zytiga (Abiraterone) has been approved.    PA# [id number] Effective dates: 10/13/17 through 02/23/2098  Patients co-pay is $11.00  Oral Oncology Clinic will continue to follow.   [place] Patient Early Phone [phone] Fax [phone] 11/13/2017 8:17 AM

## 2017-11-18 ENCOUNTER — Ambulatory Visit: Payer: Medicare Other | Admitting: Internal Medicine

## 2017-12-14 MED FILL — ABIRATERONE ACETATE 250 MG: 250 | 30 days supply | Qty: 30 | Fill #1

## 2017-12-17 DIAGNOSIS — H401131 Primary open-angle glaucoma, bilateral, mild stage: Secondary | ICD-10-CM | POA: Diagnosis not present

## 2017-12-24 DIAGNOSIS — Z85828 Personal history of other malignant neoplasm of skin: Secondary | ICD-10-CM | POA: Diagnosis not present

## 2017-12-24 DIAGNOSIS — L821 Other seborrheic keratosis: Secondary | ICD-10-CM | POA: Diagnosis not present

## 2017-12-24 DIAGNOSIS — L820 Inflamed seborrheic keratosis: Secondary | ICD-10-CM | POA: Diagnosis not present

## 2018-01-11 ENCOUNTER — Other Ambulatory Visit: Payer: Self-pay

## 2018-01-11 ENCOUNTER — Inpatient Hospital Stay (HOSPITAL_BASED_OUTPATIENT_CLINIC_OR_DEPARTMENT_OTHER): Payer: Medicare Other | Admitting: Internal Medicine

## 2018-01-11 ENCOUNTER — Inpatient Hospital Stay: Payer: Medicare Other | Attending: Internal Medicine

## 2018-01-11 ENCOUNTER — Inpatient Hospital Stay: Payer: Medicare Other

## 2018-01-11 VITALS — BP 126/71 | HR 57 | Temp 98.3°F | Resp 20 | Ht 74.0 in | Wt 181.1 lb

## 2018-01-11 DIAGNOSIS — K5900 Constipation, unspecified: Secondary | ICD-10-CM | POA: Insufficient documentation

## 2018-01-11 DIAGNOSIS — Z7982 Long term (current) use of aspirin: Secondary | ICD-10-CM | POA: Insufficient documentation

## 2018-01-11 DIAGNOSIS — C61 Malignant neoplasm of prostate: Secondary | ICD-10-CM | POA: Diagnosis not present

## 2018-01-11 DIAGNOSIS — N1330 Unspecified hydronephrosis: Secondary | ICD-10-CM | POA: Insufficient documentation

## 2018-01-11 DIAGNOSIS — M129 Arthropathy, unspecified: Secondary | ICD-10-CM

## 2018-01-11 DIAGNOSIS — D509 Iron deficiency anemia, unspecified: Secondary | ICD-10-CM

## 2018-01-11 DIAGNOSIS — Z79818 Long term (current) use of other agents affecting estrogen receptors and estrogen levels: Secondary | ICD-10-CM | POA: Insufficient documentation

## 2018-01-11 DIAGNOSIS — Z806 Family history of leukemia: Secondary | ICD-10-CM | POA: Insufficient documentation

## 2018-01-11 DIAGNOSIS — Z191 Hormone sensitive malignancy status: Secondary | ICD-10-CM | POA: Insufficient documentation

## 2018-01-11 DIAGNOSIS — R000 Tachycardia, unspecified: Secondary | ICD-10-CM

## 2018-01-11 DIAGNOSIS — L03115 Cellulitis of right lower limb: Secondary | ICD-10-CM

## 2018-01-11 DIAGNOSIS — R413 Other amnesia: Secondary | ICD-10-CM

## 2018-01-11 DIAGNOSIS — Z79899 Other long term (current) drug therapy: Secondary | ICD-10-CM | POA: Insufficient documentation

## 2018-01-11 DIAGNOSIS — M40293 Other kyphosis, cervicothoracic region: Secondary | ICD-10-CM

## 2018-01-11 DIAGNOSIS — E538 Deficiency of other specified B group vitamins: Secondary | ICD-10-CM | POA: Insufficient documentation

## 2018-01-11 DIAGNOSIS — I700 Atherosclerosis of aorta: Secondary | ICD-10-CM | POA: Insufficient documentation

## 2018-01-11 DIAGNOSIS — M810 Age-related osteoporosis without current pathological fracture: Secondary | ICD-10-CM | POA: Diagnosis not present

## 2018-01-11 DIAGNOSIS — Z86010 Personal history of colonic polyps: Secondary | ICD-10-CM | POA: Diagnosis not present

## 2018-01-11 DIAGNOSIS — Z85828 Personal history of other malignant neoplasm of skin: Secondary | ICD-10-CM | POA: Diagnosis not present

## 2018-01-11 DIAGNOSIS — R590 Localized enlarged lymph nodes: Secondary | ICD-10-CM | POA: Insufficient documentation

## 2018-01-11 LAB — COMPREHENSIVE METABOLIC PANEL
ALBUMIN: 4 g/dL (ref 3.5–5.0)
ALT: 12 U/L (ref 0–44)
ANION GAP: 11 (ref 5–15)
AST: 24 U/L (ref 15–41)
Alkaline Phosphatase: 51 U/L (ref 38–126)
BUN: 22 mg/dL (ref 8–23)
CALCIUM: 8.9 mg/dL (ref 8.9–10.3)
CO2: 24 mmol/L (ref 22–32)
Chloride: 102 mmol/L (ref 98–111)
Creatinine, Ser: 1.13 mg/dL (ref 0.61–1.24)
GFR calc non Af Amer: 53 mL/min — ABNORMAL LOW (ref >60)
GLUCOSE: 187 mg/dL — AB (ref 70–99)
POTASSIUM: 3.7 mmol/L (ref 3.5–5.1)
SODIUM: 137 mmol/L (ref 135–145)
Total Bilirubin: 0.4 mg/dL (ref 0.3–1.2)
Total Protein: 7.2 g/dL (ref 6.5–8.1)

## 2018-01-11 LAB — CBC WITH DIFFERENTIAL/PLATELET
ABS IMMATURE GRANULOCYTES: 0.01 10*3/uL (ref 0.00–0.07)
BASOS PCT: 2 %
Basophils Absolute: 0.1 10*3/uL (ref 0.0–0.1)
Eosinophils Absolute: 0.5 10*3/uL (ref 0.0–0.5)
Eosinophils Relative: 9 %
HCT: 34.5 % — ABNORMAL LOW (ref 39.0–52.0)
HEMOGLOBIN: 11.2 g/dL — AB (ref 13.0–17.0)
Immature Granulocytes: 0 %
LYMPHS PCT: 28 %
Lymphs Abs: 1.6 10*3/uL (ref 0.7–4.0)
MCH: 29.2 pg (ref 26.0–34.0)
MCHC: 32.5 g/dL (ref 30.0–36.0)
MCV: 90.1 fL (ref 80.0–100.0)
MONO ABS: 0.5 10*3/uL (ref 0.1–1.0)
Monocytes Relative: 9 %
NEUTROS ABS: 3.1 10*3/uL (ref 1.7–7.7)
NEUTROS PCT: 52 %
PLATELETS: 198 10*3/uL (ref 150–400)
RBC: 3.83 MIL/uL — AB (ref 4.22–5.81)
RDW: 13.1 % (ref 11.5–15.5)
WBC: 5.8 10*3/uL (ref 4.0–10.5)
nRBC: 0 % (ref 0.0–0.2)

## 2018-01-11 LAB — IRON AND TIBC
IRON: 60 ug/dL (ref 45–182)
Saturation Ratios: 18 % (ref 17.9–39.5)
TIBC: 330 ug/dL (ref 250–450)
UIBC: 270 ug/dL

## 2018-01-11 LAB — PSA: Prostatic Specific Antigen: 0.5 ng/mL (ref 0.00–4.00)

## 2018-01-11 LAB — FERRITIN: Ferritin: 95 ng/mL (ref 24–336)

## 2018-01-11 MED ORDER — LEUPROLIDE ACETATE (3 MONTH) 22.5 MG IM KIT
22.5000 mg | PACK | Freq: Once | INTRAMUSCULAR | Status: AC
  Administered 2018-01-11: 22.5 mg via INTRAMUSCULAR
  Filled 2018-01-11: qty 22.5

## 2018-01-11 NOTE — Assessment & Plan Note (Addendum)
#   CASTRATE RESISTANT PROSTATE CANCER- Metastatic disease with left bladder invasion/obstruction.   September 2019 PSA 0.99/stable.  # On Zytiga 1 pill a day with low-fat meal; tolerating well. Continue zytiga for now.   # Severe osteoporosis: Currently on Prolia;STABLE.   # Left ureteral stent placement/hydronephrosis: Status post exchange.  Stable; renal function.   # Iron deficiency anemia: STABLE. IV iron as needed [pill esophagitis]  #Right lower extremity cellulitis/dermatitis chronic- status post antibiotics/Lasix.  STABLE.   #DISPOSITION:  # Lupron today.  # Follow-up- in 6 weeks- MD labs-CBC CMP PSA-Dr.B

## 2018-01-11 NOTE — Progress Notes (Signed)
[facility] OFFICE PROGRESS NOTE  Patient Care Team: Venia Carbon, MD as PCP - General  Cancer Staging No matching staging information was found for the patient.   Oncology History    # 1996 PROSTATE CANCER [Gleason score 2+3] s/p Radical Prostatectomy [[facility]]; ?Biochem RECURRENT PROSTATE CANCER-Hormone sensitive;  Intermittent Lupron; on Lupron q 8M [Jan 2016 PSA- 7.9].  HOLD LUPRON in MAY 2017; AUg 2017- Re-start  # OCT 2018- Xtandi 3 pills a day; OCT 30th PET- Retroperitoneal/pelvic adenopathy/recurrence lateral bladder wall; NO Bone lesions; SEP 2019- STOP X-tandi sec difficulty swallowing  # sep 19th START Zytiga 1 pill/day  # SEP 2019- Dysphagia/pill esophagitis [s/p EGD Dr.Toledo]  # Sep 2019- Zytiga 250 mg/day   # Osteoporosis [BMD- 2015]- on Reclast q 60M; FEB 2017- Start Prolia q 6 M -------------------------------------------------------------   DIAGNOSIS: Recurrent metastatic prostate cancer castrate resistant  STAGE:   4      ;GOALS: Palliative  CURRENT/MOST RECENT THERAPY : Zytiga [ordered sep19th]      Prostate cancer ([place])    INTERVAL HISTORY:  Benjamin Villarreal 84 y.o.  male pleasant patient above history of metastatic castrate resistant prostate cancer is here for follow-up.  Patient denies any recent hospitalizations.  Patient is currently on Zytiga 1 pill a day.  Denies any difficulty swallowing.  Denies any worsening swelling in the legs.  As per the family patient having memory problems.  Review of Systems  Constitutional: Positive for malaise/fatigue. Negative for chills, diaphoresis, fever and weight loss.  HENT: Negative for nosebleeds and sore throat.   Eyes: Negative for double vision.  Respiratory: Negative for cough, hemoptysis, sputum production, shortness of breath and wheezing.   Cardiovascular: Negative for chest pain, palpitations and orthopnea.  Gastrointestinal: Negative for abdominal pain, blood in stool,  constipation, diarrhea, heartburn, melena, nausea and vomiting.  Genitourinary: Negative for dysuria, frequency and urgency.  Musculoskeletal: Negative for back pain and joint pain.  Skin: Negative.  Negative for itching and rash.  Neurological: Negative for dizziness, tingling, focal weakness, weakness and headaches.  Endo/Heme/Allergies: Does not bruise/bleed easily.  Psychiatric/Behavioral: Positive for memory loss. Negative for depression. The patient is not nervous/anxious and does not have insomnia.       PAST MEDICAL HISTORY :  Past Medical History:  Diagnosis Date  . Aortic atherosclerosis ([place])    Noted on CT  . Arthritis   . Colon polyps    adenomatous  . History of fracture of tibia   . History of tachycardia    patient unaware of this  . Kyphosis of cervicothoracic region    SEVERE  . Osteoporosis 01/2014   T -3.8 (01/2014)  . Other constipation    chronic  . Prostate cancer [facility]) 1996   surgery then lupron  . Skin cancer, basal cell 2004   surgical resection.  Marland Kitchen Unspecified venous (peripheral) insufficiency   . Vitamin B12 deficiency     PAST SURGICAL HISTORY :   Past Surgical History:  Procedure Laterality Date  . BASAL CELL CARCINOMA EXCISION  2004   Left side of face  . CATARACT EXTRACTION Bilateral 2000, 2003   both eyes  . COLONOSCOPY     h/o adenomatous polyps  . CYSTOSCOPY W/ RETROGRADES Bilateral 11/19/2016   Procedure: CYSTOSCOPY WITH RETROGRADE PYELOGRAM;  Surgeon: Hollice Espy, MD;  Location: [facility] ORS;  Service: Urology;  Laterality: Bilateral;  General vs. Spinal  . CYSTOSCOPY W/ URETERAL STENT PLACEMENT Left 02/25/2017   Procedure: CYSTOSCOPY WITH STENT REPLACEMENT;  Surgeon: Hollice Espy, MD;  Location: [facility] ORS;  Service: Urology;  Laterality: Left;  . CYSTOSCOPY WITH URETEROSCOPY AND STENT PLACEMENT Left 11/19/2016   Procedure: CYSTOSCOPY WITH URETEROSCOPY AND STENT PLACEMENT;  Surgeon: Hollice Espy, MD;  Location: [facility] ORS;  Service:  Urology;  Laterality: Left;  . ESOPHAGOGASTRODUODENOSCOPY N/A 10/31/2017   Procedure: ESOPHAGOGASTRODUODENOSCOPY (EGD) with biopsy and brushings;  Surgeon: Toledo, Benay Pike, MD;  Location: [facility] ENDOSCOPY;  Service: Gastroenterology;  Laterality: N/A;  . FRACTURE SURGERY    . JOINT REPLACEMENT  2005   Left knee  . JOINT REPLACEMENT  12/13   Right knee  . PROSTATE SURGERY  1996   cancer  . sigmoid resection    . TIBIA FRACTURE SURGERY  1999  . URETERAL BIOPSY Left 11/19/2016   Procedure: URETERAL BIOPSY;  Surgeon: Hollice Espy, MD;  Location: [facility] ORS;  Service: Urology;  Laterality: Left;  Marland Kitchen VOLVULUS REDUCTION  12/10    FAMILY HISTORY :   Family History  Problem Relation Age of Onset  . Leukemia Mother   . Diabetes Son   . Prostate cancer Neg Hx   . Bladder Cancer Neg Hx   . Kidney cancer Neg Hx     SOCIAL HISTORY:   Social History   Tobacco Use  . Smoking status: Never Smoker  . Smokeless tobacco: Never Used  Substance Use Topics  . Alcohol use: No    Alcohol/week: 0.0 - 1.0 standard drinks    Frequency: Never    Comment: in past  . Drug use: No    ALLERGIES:  is allergic to other.  MEDICATIONS:  Current Outpatient Medications  Medication Sig Dispense Refill  . abiraterone acetate (ZYTIGA) 250 MG tablet Take 1 tablet (250 mg total) by mouth daily. Take with a low fat meal. 30 tablet 2  . alum & mag hydroxide-simeth (MAALOX/MYLANTA) 200-200-20 MG/5ML suspension Take 30 mLs by mouth every 4 (four) hours as needed for indigestion or heartburn.    Marland Kitchen aspirin EC 81 MG tablet Take 81 mg by mouth every other day.    . Biotin 2.5 MG CAPS Take 2.5 mg by mouth daily.     Marland Kitchen bismuth subsalicylate (KAOPECTATE) 262 MG/15ML suspension Take 10 mLs by mouth 3 (three) times daily as needed for diarrhea or loose stools.     . carbamide peroxide (DEBROX) 6.5 % OTIC solution 5 drops 2 (two) times daily.    Marland Kitchen denosumab (PROLIA) 60 MG/ML SOSY injection Inject 60 mg into the skin every 6  (six) months.    . diphenhydrAMINE (BENADRYL) 25 mg capsule Take 25 mg by mouth every 4 (four) hours as needed for itching or allergies.    . furosemide (LASIX) 40 MG tablet Take 1 tablet by mouth daily.    Marland Kitchen latanoprost (XALATAN) 0.005 % ophthalmic solution Place 1 drop into both eyes at bedtime.    . mupirocin ointment (BACTROBAN) 2 % Place 1 application into the nose daily.    . pantoprazole (PROTONIX) 40 MG tablet Take 1 tablet (40 mg total) by mouth 2 (two) times daily. 60 tablet 0  . polyethylene glycol powder (GLYCOLAX/MIRALAX) powder FILL TO LINE (17 GRAMS ), MIX AND DRINK TWICE A DAY AND 8.5 GRAMS AT NOON 510 g 3  . sennosides-docusate sodium (SENOKOT-S) 8.6-50 MG tablet Take 4 tablets by mouth every evening.     . Skin Protectants, Misc. (EUCERIN) cream Apply 1 application topically 2 (two) times daily as needed for dry skin.    Marland Kitchen triamcinolone  cream (KENALOG) 0.1 % Apply 1 application topically daily as needed.     . vitamin B-12 (CYANOCOBALAMIN) 1000 MCG tablet Take 1,000 mcg by mouth daily.    Marland Kitchen acetaminophen (TYLENOL) 325 MG tablet Take 325-650 mg by mouth every 4 (four) hours as needed for fever (general discomfort).     . clindamycin (CLEOCIN) 150 MG capsule Take 4 capsules by mouth as needed. Prior to dental work    . cyclobenzaprine (FLEXERIL) 5 MG tablet Take 5 mg by mouth daily as needed for muscle spasms.    . traMADol (ULTRAM) 50 MG tablet Take by mouth every 6 (six) hours as needed for moderate pain.     No current facility-administered medications for this visit.    Facility-Administered Medications Ordered in Other Visits  Medication Dose Route Frequency Provider Last Rate Last Dose  . leuprolide (LUPRON) injection 30 mg  30 mg Intramuscular Once Leia Alf, MD        PHYSICAL EXAMINATION: ECOG PERFORMANCE STATUS: 2 - Symptomatic, <50% confined to bed  BP 126/71 (Patient Position: Sitting)   Pulse (!) 57   Temp 98.3 F (36.8 C) (Oral)   Resp 20   Ht 6\' 2"   (1.88 m)   Wt 181 lb 1.6 oz (82.1 kg)   BMI 23.25 kg/m   Filed Weights   01/11/18 1048  Weight: 181 lb 1.6 oz (82.1 kg)    Physical Exam  Constitutional: He is oriented to person, place, and time and well-developed, well-nourished, and in no distress.  Elderly Caucasian male patient.  He walks with a walker.  Accompanied by his daughter.  HENT:  Head: Normocephalic and atraumatic.  Mouth/Throat: Oropharynx is clear and moist. No oropharyngeal exudate.  Eyes: Pupils are equal, round, and reactive to light.  Neck: Normal range of motion. Neck supple.  Cardiovascular: Normal rate and regular rhythm.  Pulmonary/Chest: No respiratory distress. He has no wheezes.  Abdominal: Soft. Bowel sounds are normal. He exhibits no distension and no mass. There is no tenderness. There is no rebound and no guarding.  Musculoskeletal: Normal range of motion. He exhibits no tenderness.  Neurological: He is alert and oriented to person, place, and time.  Skin: Skin is warm.  Psychiatric: Affect normal.       LABORATORY DATA:  I have reviewed the data as listed    Component Value Date/Time   NA 137 01/11/2018 1007   NA 140 02/11/2012 0415   K 3.7 01/11/2018 1007   K 3.9 02/11/2012 0415   CL 102 01/11/2018 1007   CL 108 (H) 02/11/2012 0415   CO2 24 01/11/2018 1007   CO2 25 02/11/2012 0415   GLUCOSE 187 (H) 01/11/2018 1007   GLUCOSE 125 (H) 02/11/2012 0415   BUN 22 01/11/2018 1007   BUN 12 02/11/2012 0415   CREATININE 1.13 01/11/2018 1007   CREATININE 1.00 03/23/2014 1400   CALCIUM 8.9 01/11/2018 1007   CALCIUM 8.3 (L) 03/23/2014 1400   PROT 7.2 01/11/2018 1007   PROT 7.0 03/07/2014 1414   ALBUMIN 4.0 01/11/2018 1007   ALBUMIN 3.7 03/07/2014 1414   AST 24 01/11/2018 1007   AST 16 03/07/2014 1414   ALT 12 01/11/2018 1007   ALT 19 03/07/2014 1414   ALKPHOS 51 01/11/2018 1007   ALKPHOS 69 03/07/2014 1414   BILITOT 0.4 01/11/2018 1007   BILITOT 0.3 03/07/2014 1414   GFRNONAA 53 (L)  01/11/2018 1007   GFRNONAA >60 03/23/2014 1400   GFRNONAA 49 (L) 09/02/2013 1428  GFRAA >60 01/11/2018 1007   GFRAA >60 03/23/2014 1400   GFRAA 57 (L) 09/02/2013 1428    No results found for: SPEP, UPEP  Lab Results  Component Value Date   WBC 5.8 01/11/2018   NEUTROABS 3.1 01/11/2018   HGB 11.2 (L) 01/11/2018   HCT 34.5 (L) 01/11/2018   MCV 90.1 01/11/2018   PLT 198 01/11/2018      Chemistry      Component Value Date/Time   NA 137 01/11/2018 1007   NA 140 02/11/2012 0415   K 3.7 01/11/2018 1007   K 3.9 02/11/2012 0415   CL 102 01/11/2018 1007   CL 108 (H) 02/11/2012 0415   CO2 24 01/11/2018 1007   CO2 25 02/11/2012 0415   BUN 22 01/11/2018 1007   BUN 12 02/11/2012 0415   CREATININE 1.13 01/11/2018 1007   CREATININE 1.00 03/23/2014 1400   GLU 114 06/30/2016      Component Value Date/Time   CALCIUM 8.9 01/11/2018 1007   CALCIUM 8.3 (L) 03/23/2014 1400   ALKPHOS 51 01/11/2018 1007   ALKPHOS 69 03/07/2014 1414   AST 24 01/11/2018 1007   AST 16 03/07/2014 1414   ALT 12 01/11/2018 1007   ALT 19 03/07/2014 1414   BILITOT 0.4 01/11/2018 1007   BILITOT 0.3 03/07/2014 1414       RADIOGRAPHIC STUDIES: I have personally reviewed the radiological images as listed and agreed with the findings in the report. No results found.   ASSESSMENT & PLAN:  Prostate cancer ([place]) # CASTRATE RESISTANT PROSTATE CANCER- Metastatic disease with left bladder invasion/obstruction.    # On Zytiga 1 pill a day with low-fat meal; tolerating well. Continue zytiga for now.   # Severe osteoporosis: Currently on Prolia;STABLE.   # Left ureteral stent placement/hydronephrosis: Status post exchange.  Stable; renal function.   # Iron deficiency anemia: STABLE. IV iron as needed [pill esophagitis]  #Right lower extremity cellulitis/dermatitis chronic- status post antibiotics/Lasix.  STABLE.   #DISPOSITION:  # Lupron today.  # Follow-up- in 6 weeks- MD labs-CBC CMP PSA-Dr.B   No  orders of the defined types were placed in this encounter.  All questions were answered. The patient knows to call the clinic with any problems, questions or concerns.      Cammie Sickle, MD 01/11/2018 12:48 PM

## 2018-01-14 ENCOUNTER — Encounter: Payer: Self-pay | Admitting: Internal Medicine

## 2018-01-14 ENCOUNTER — Ambulatory Visit: Payer: Medicare Other | Admitting: Internal Medicine

## 2018-01-14 VITALS — BP 127/74 | HR 70 | Resp 16 | Wt 181.2 lb

## 2018-01-14 DIAGNOSIS — C61 Malignant neoplasm of prostate: Secondary | ICD-10-CM | POA: Diagnosis not present

## 2018-01-14 DIAGNOSIS — I872 Venous insufficiency (chronic) (peripheral): Secondary | ICD-10-CM | POA: Diagnosis not present

## 2018-01-14 DIAGNOSIS — K5909 Other constipation: Secondary | ICD-10-CM

## 2018-01-14 DIAGNOSIS — G463 Brain stem stroke syndrome: Secondary | ICD-10-CM | POA: Diagnosis not present

## 2018-01-14 DIAGNOSIS — K222 Esophageal obstruction: Secondary | ICD-10-CM | POA: Diagnosis not present

## 2018-01-14 MED ORDER — PANTOPRAZOLE SODIUM 40 MG PO TBEC: 40.0000 mg | DELAYED_RELEASE_TABLET | Freq: Every day | ORAL | 0 refills | Status: AC

## 2018-01-14 MED FILL — ABIRATERONE ACETATE 250 MG: 250 | 30 days supply | Qty: 30 | Fill #2

## 2018-01-14 NOTE — Progress Notes (Signed)
Subjective:    Patient ID: Benjamin Villarreal, male    DOB: 1921-02-22, 84 y.o.   MRN: [id number]  HPI Visit in [place] apartment for review of chronic health conditions Reviewed status with Luellen Pucker RN Reviewed daughter's concerns in recent email  Reviewed oncology notes Still on Rx PSA staying down Voids okay Generally doesn't have nocturia  Swelling in legs is considerably better Right is still somewhat worse He uses the pump twice a day still --from the vascular surgeon  He reports no recent pain No using pain meds  Chronic constipation Has been going pretty well on miralax and senna--usually goes once a day in AM Gets some gas at times  No longer having any dysphagia No heartburn  Current Outpatient Medications on File Prior to Visit  Medication Sig Dispense Refill  . abiraterone acetate (ZYTIGA) 250 MG tablet Take 1 tablet (250 mg total) by mouth daily. Take with a low fat meal. 30 tablet 2  . acetaminophen (TYLENOL) 325 MG tablet Take 325-650 mg by mouth every 4 (four) hours as needed for fever (general discomfort).     Marland Kitchen aspirin EC 81 MG tablet Take 81 mg by mouth every other day.    . Biotin 2.5 MG CAPS Take 2.5 mg by mouth daily.     . clindamycin (CLEOCIN) 150 MG capsule Take 4 capsules by mouth as needed. Prior to dental work    . cyanocobalamin (,VITAMIN B-12,) 1000 MCG/ML injection Inject 1,000 mcg into the muscle every 30 (thirty) days.    . cyclobenzaprine (FLEXERIL) 5 MG tablet Take 5 mg by mouth daily as needed for muscle spasms.    Marland Kitchen denosumab (PROLIA) 60 MG/ML SOSY injection Inject 60 mg into the skin every 6 (six) months.    . furosemide (LASIX) 40 MG tablet Take 1 tablet by mouth daily.    Marland Kitchen latanoprost (XALATAN) 0.005 % ophthalmic solution Place 1 drop into both eyes at bedtime.    . mupirocin ointment (BACTROBAN) 2 % Place 1 application into the nose daily.    . pantoprazole (PROTONIX) 40 MG tablet Take 1 tablet (40 mg total) by mouth 2 (two) times daily. 60  tablet 0  . polyethylene glycol powder (GLYCOLAX/MIRALAX) powder FILL TO LINE (17 GRAMS ), MIX AND DRINK TWICE A DAY AND 8.5 GRAMS AT NOON 510 g 3  . sennosides-docusate sodium (SENOKOT-S) 8.6-50 MG tablet Take 4 tablets by mouth every evening.     . Skin Protectants, Misc. (EUCERIN) cream Apply 1 application topically 2 (two) times daily as needed for dry skin.    Marland Kitchen traMADol (ULTRAM) 50 MG tablet Take by mouth every 6 (six) hours as needed for moderate pain.    Marland Kitchen triamcinolone cream (KENALOG) 0.1 % Apply 1 application topically daily as needed.      Current Facility-Administered Medications on File Prior to Visit  Medication Dose Route Frequency Provider Last Rate Last Dose  . leuprolide (LUPRON) injection 30 mg  30 mg Intramuscular Once Leia Alf, MD        Allergies  Allergen Reactions  . Other     RAW ONIONS- SINUS REACTION    Past Medical History:  Diagnosis Date  . Aortic atherosclerosis ([place])    Noted on CT  . Arthritis   . Colon polyps    adenomatous  . History of fracture of tibia   . History of tachycardia    patient unaware of this  . Kyphosis of cervicothoracic region    SEVERE  .  Osteoporosis 01/2014   T -3.8 (01/2014)  . Other constipation    chronic  . Prostate cancer [facility]) 1996   surgery then lupron  . Skin cancer, basal cell 2004   surgical resection.  Marland Kitchen Unspecified venous (peripheral) insufficiency   . Vitamin B12 deficiency     Past Surgical History:  Procedure Laterality Date  . BASAL CELL CARCINOMA EXCISION  2004   Left side of face  . CATARACT EXTRACTION Bilateral 2000, 2003   both eyes  . COLONOSCOPY     h/o adenomatous polyps  . CYSTOSCOPY W/ RETROGRADES Bilateral 11/19/2016   Procedure: CYSTOSCOPY WITH RETROGRADE PYELOGRAM;  Surgeon: Hollice Espy, MD;  Location: [facility] OR[facility];  Service: Urology;  Laterality: Bilateral;  General vs. Spinal  . CYSTOSCOPY W/ URETERAL STENT PLACEMENT Left 02/25/2017   Procedure: CYSTOSCOPY WITH STENT  REPLACEMENT;  Surgeon: Hollice Espy, MD;  Location: [facility] OR[facility];  Service: Urology;  Laterality: Left;  . CYSTOSCOPY WITH URETEROSCOPY AND STENT PLACEMENT Left 11/19/2016   Procedure: CYSTOSCOPY WITH URETEROSCOPY AND STENT PLACEMENT;  Surgeon: Hollice Espy, MD;  Location: [facility];  Service: Urology;  Laterality: Left;  . ESOPHAGOGASTRODUODENOSCOPY N/A 10/31/2017   Procedure: ESOPHAGOGASTRODUODENOSCOPY (EGD) with biopsy and brushings;  Surgeon: Toledo, Benay Pike, MD;  Location: [facility];  Service: Gastroenterology;  Laterality: N/A;  . FRACTURE SURGERY    . JOINT REPLACEMENT  2005   Left knee  . JOINT REPLACEMENT  12/13   Right knee  . PROSTATE SURGERY  1996   cancer  . sigmoid resection    . TIBIA FRACTURE SURGERY  1999  . URETERAL BIOPSY Left 11/19/2016   Procedure: URETERAL BIOPSY;  Surgeon: Hollice Espy, MD;  Location: [facility];  Service: Urology;  Laterality: Left;  Marland Kitchen VOLVULUS REDUCTION  12/10    Family History  Problem Relation Age of Onset  . Leukemia Mother   . Diabetes Son   . Prostate cancer Neg Hx   . Bladder Cancer Neg Hx   . Kidney cancer Neg Hx     Social History   Socioeconomic History  . Marital status: Widowed    Spouse name: [name]  . Number of children: 3  . Years of education: Not on file  . Highest education level: Not on file  Occupational History  . 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 Financial resource strain: Not on file  . Food insecurity:    Worry: Not on file    Inability: Not on file  . Transportation needs:    Medical: Not on file    Non-medical: Not on file  Tobacco Use  . Smoking status: Never Smoker  . Smokeless tobacco: Never Used  Substance and Sexual Activity  . Alcohol use: No    Alcohol/week: 0.0 - 1.0 standard drinks    Frequency: Never    Comment: in past  . Drug use: No  . Sexual activity: Not Currently  Lifestyle  . Physical activity:    Days per week: Not on file    Minutes  per session: Not on file  . Stress: Not on file  Relationships  . Social connections:    Talks on phone: Not on file    Gets together: Not on file    Attends religious service: Not on file    Active member of club or organization: Not on file    Attends meetings of clubs or organizations: Not on file    Relationship status: Not on file  .  Intimate partner violence:    Fear of current or ex partner: Not on file    Emotionally abused: Not on file    Physically abused: Not on file    Forced sexual activity: Not on file  Other Topics Concern  . Not on file  Social History Narrative   Has living will   Daughter [name] holds health care POA.   Has DNR and we have reviewed this.   Would not want tube feedings if cognitively unaware   Review of Systems Appetite is good Weight is stable Sleeps well ----can fall asleep easily in day, but not pathologic No skin issues other than dry skin---uses cream for  Notes some balance issues---walks with the rollator    Objective:   Physical Exam  Constitutional: He appears well-developed.  Neck: No thyromegaly present.  Cardiovascular: Normal rate and regular rhythm. Exam reveals no gallop.  VQ0/0 systolic murmur (MR?)  Respiratory: Effort normal and breath sounds normal. No respiratory distress. He has no wheezes. He has no rales.  GI: Soft. There is no tenderness.  Musculoskeletal:  Right calf is bigger than left but no sig edema in support hose  Lymphadenopathy:    He has no cervical adenopathy.  Psychiatric: He has a normal mood and affect. His behavior is normal.           Assessment & Plan:

## 2018-01-14 NOTE — Assessment & Plan Note (Signed)
Doing okay on current Rx Would continue

## 2018-01-14 NOTE — Assessment & Plan Note (Signed)
No recent vertigo Will continue asa as secondary prevention

## 2018-01-14 NOTE — Assessment & Plan Note (Signed)
Really has very little swelling Can use the pump prn only at this point

## 2018-01-14 NOTE — Assessment & Plan Note (Signed)
Has bony lesions but no current pain now Will just leave the tramadol for prn use

## 2018-01-14 NOTE — Assessment & Plan Note (Signed)
Continues on Rx with oncology

## 2018-01-14 NOTE — Assessment & Plan Note (Signed)
No symptoms now Will cut back PPI to once a day

## 2018-01-20 ENCOUNTER — Telehealth: Payer: Self-pay | Admitting: *Deleted

## 2018-01-20 NOTE — Telephone Encounter (Signed)
Per Dr. Rogue Bussing - hold the zytiga until next apt with Dr. Jacinto Reap

## 2018-01-20 NOTE — Telephone Encounter (Signed)
-----   Message from Shawnee Knapp, RN sent at 01/20/2018 11:38 AM EST ----- Regarding: [name] Patient's daughter called expressing concerns about Mr. Kutner's lethargy.  Memory is getting worse and he is so lethargic he is falling asleep sitting up in his chair.  This has only occurred since he restarted the medication.  His next appt is not until 12/30.

## 2018-01-20 NOTE — Telephone Encounter (Signed)
I attempted to contact patient's daughter [name], per most updated DPR, it is ok to leave message on her voicemail. I was unable to reach her because phone line kept ringing. I will try again later.

## 2018-01-27 NOTE — Telephone Encounter (Signed)
I left voicemail for patient's daughter, [name], to hold patient's Zytiga until next appt with Dr. Jacinto Reap on 12/30. I advised daughter to call back with any questions

## 2018-02-05 ENCOUNTER — Other Ambulatory Visit: Payer: Self-pay | Admitting: Internal Medicine

## 2018-02-05 DIAGNOSIS — C61 Malignant neoplasm of prostate: Secondary | ICD-10-CM

## 2018-02-11 MED FILL — ABIRATERONE ACETATE 250 MG: 250 | 30 days supply | Qty: 30 | Fill #0

## 2018-02-18 ENCOUNTER — Other Ambulatory Visit: Payer: Self-pay

## 2018-02-18 DIAGNOSIS — C61 Malignant neoplasm of prostate: Secondary | ICD-10-CM

## 2018-02-22 ENCOUNTER — Other Ambulatory Visit: Payer: Medicare Other

## 2018-02-22 ENCOUNTER — Inpatient Hospital Stay: Payer: Medicare Other | Attending: Internal Medicine

## 2018-02-22 ENCOUNTER — Ambulatory Visit
Admission: RE | Admit: 2018-02-22 | Discharge: 2018-02-22 | Disposition: A | Discharge status: Home / Self Care | Payer: Medicare Other | Source: Ambulatory Visit | Attending: Internal Medicine | Admitting: Internal Medicine

## 2018-02-22 ENCOUNTER — Other Ambulatory Visit: Payer: Self-pay

## 2018-02-22 ENCOUNTER — Ambulatory Visit
Admission: RE | Admit: 2018-02-22 | Discharge: 2018-02-22 | Disposition: A | Discharge status: Home / Self Care | Payer: Medicare Other | Attending: Internal Medicine | Admitting: Internal Medicine

## 2018-02-22 ENCOUNTER — Inpatient Hospital Stay (HOSPITAL_BASED_OUTPATIENT_CLINIC_OR_DEPARTMENT_OTHER): Payer: Medicare Other | Admitting: Internal Medicine

## 2018-02-22 ENCOUNTER — Encounter: Payer: Self-pay | Admitting: Internal Medicine

## 2018-02-22 VITALS — BP 164/67 | HR 70 | Temp 98.2°F | Resp 20 | Ht 74.0 in | Wt 145.3 lb

## 2018-02-22 DIAGNOSIS — M810 Age-related osteoporosis without current pathological fracture: Secondary | ICD-10-CM | POA: Diagnosis not present

## 2018-02-22 DIAGNOSIS — R1310 Dysphagia, unspecified: Secondary | ICD-10-CM | POA: Insufficient documentation

## 2018-02-22 DIAGNOSIS — C61 Malignant neoplasm of prostate: Secondary | ICD-10-CM | POA: Insufficient documentation

## 2018-02-22 DIAGNOSIS — R059 Cough, unspecified: Secondary | ICD-10-CM

## 2018-02-22 DIAGNOSIS — R634 Abnormal weight loss: Secondary | ICD-10-CM

## 2018-02-22 DIAGNOSIS — R05 Cough: Secondary | ICD-10-CM

## 2018-02-22 DIAGNOSIS — Z7982 Long term (current) use of aspirin: Secondary | ICD-10-CM | POA: Insufficient documentation

## 2018-02-22 DIAGNOSIS — Z79899 Other long term (current) drug therapy: Secondary | ICD-10-CM | POA: Insufficient documentation

## 2018-02-22 DIAGNOSIS — D509 Iron deficiency anemia, unspecified: Secondary | ICD-10-CM | POA: Diagnosis not present

## 2018-02-22 LAB — COMPREHENSIVE METABOLIC PANEL
ALT: 10 U/L (ref 0–44)
AST: 19 U/L (ref 15–41)
Albumin: 3.8 g/dL (ref 3.5–5.0)
Alkaline Phosphatase: 47 U/L (ref 38–126)
Anion gap: 10 (ref 5–15)
BUN: 21 mg/dL (ref 8–23)
CO2: 27 mmol/L (ref 22–32)
CREATININE: 1.09 mg/dL (ref 0.61–1.24)
Calcium: 8.4 mg/dL — ABNORMAL LOW (ref 8.9–10.3)
Chloride: 102 mmol/L (ref 98–111)
GFR calc Af Amer: >60 mL/min (ref >60)
GFR calc non Af Amer: 57 mL/min — ABNORMAL LOW (ref >60)
Glucose, Bld: 195 mg/dL — ABNORMAL HIGH (ref 70–99)
Potassium: 3.6 mmol/L (ref 3.5–5.1)
Sodium: 139 mmol/L (ref 135–145)
Total Bilirubin: 0.8 mg/dL (ref 0.3–1.2)
Total Protein: 6.8 g/dL (ref 6.5–8.1)

## 2018-02-22 LAB — CBC WITH DIFFERENTIAL/PLATELET
Abs Immature Granulocytes: 0.02 10*3/uL (ref 0.00–0.07)
Basophils Absolute: 0.1 10*3/uL (ref 0.0–0.1)
Basophils Relative: 1 %
Eosinophils Absolute: 0.5 10*3/uL (ref 0.0–0.5)
Eosinophils Relative: 6 %
HEMATOCRIT: 32.8 % — AB (ref 39.0–52.0)
Hemoglobin: 10.7 g/dL — ABNORMAL LOW (ref 13.0–17.0)
IMMATURE GRANULOCYTES: 0 %
Lymphocytes Relative: 25 %
Lymphs Abs: 1.9 10*3/uL (ref 0.7–4.0)
MCH: 29.2 pg (ref 26.0–34.0)
MCHC: 32.6 g/dL (ref 30.0–36.0)
MCV: 89.4 fL (ref 80.0–100.0)
Monocytes Absolute: 0.8 10*3/uL (ref 0.1–1.0)
Monocytes Relative: 10 %
Neutro Abs: 4.3 10*3/uL (ref 1.7–7.7)
Neutrophils Relative %: 58 %
Platelets: 151 10*3/uL (ref 150–400)
RBC: 3.67 MIL/uL — ABNORMAL LOW (ref 4.22–5.81)
RDW: 13.6 % (ref 11.5–15.5)
WBC: 7.6 10*3/uL (ref 4.0–10.5)
nRBC: 0 % (ref 0.0–0.2)

## 2018-02-22 LAB — PSA: Prostatic Specific Antigen: 0.35 ng/mL (ref 0.00–4.00)

## 2018-02-22 NOTE — Assessment & Plan Note (Addendum)
#   CASTRATE RESISTANT PROSTATE CANCER- Metastatic disease with left bladder invasion/obstruction.   NOV 2019 PSA 0.50; HOLD Zytiga/lupron- sec to fatigue/pt pref. [See below]  #Cough hoarseness of voice- ?  Reflux issues versus others.  Check chest x-ray today.  Recommend continue Protonix morning hour prior to breakfast.  #Fatigue-question related to Zytiga versus others.  Given the good response to current therapy reasonable to hold Zytiga/Lupron at this time.  # Severe osteoporosis: Currently on Prolia; stable  # Left ureteral stent placement/hydronephrosis: Status post exchange.  Stable renal function.  # Iron deficiency anemia: Stable IV iron as needed [pill esophagitis]  # weight loss-unclear etiology.  Recommend dietitian referral at the nursing home.  # palliative care discussion-we will review at next visit.  #DISPOSITION:  # CXR today.  # Follow-up- in 6 weeks MD labs-CBC CMP PSA; possible lupron-Dr.B  Addendum chest x-ray-negative for any acute process chronic bronchitic changes.  Family to be informed.

## 2018-02-22 NOTE — Progress Notes (Signed)
[facility] OFFICE PROGRESS NOTE  Patient Care Team: Venia Carbon, MD as PCP - General  Cancer Staging No matching staging information was found for the patient.   Oncology History    # 1996 PROSTATE CANCER [Gleason score 2+3] s/p Radical Prostatectomy [[facility]]; ?Biochem RECURRENT PROSTATE CANCER-Hormone sensitive;  Intermittent Lupron; on Lupron q 85M [Jan 2016 PSA- 7.9].  HOLD LUPRON in MAY 2017; AUg 2017- Re-start  # OCT 2018- Xtandi 3 pills a day; OCT 30th PET- Retroperitoneal/pelvic adenopathy/recurrence lateral bladder wall; NO Bone lesions; SEP 2019- STOP X-tandi sec difficulty swallowing  # sep 19th START Zytiga 1 pill/day  # SEP 2019- Dysphagia/pill esophagitis [s/p EGD Dr.Toledo]  # Sep 2019- Zytiga 250 mg/day   # Osteoporosis [BMD- 2015]- on Reclast q 30M; FEB 2017- Start Prolia q 6 M -------------------------------------------------------------   DIAGNOSIS: Recurrent metastatic prostate cancer castrate resistant  STAGE:   4      ;GOALS: Palliative  CURRENT/MOST RECENT THERAPY : Zytiga [ordered sep19th]      Prostate cancer ([place])    INTERVAL HISTORY:  Benjamin Villarreal 84 y.o.  male pleasant patient above history of metastatic castrate resistant prostate cancer is here for follow-up.  Patient has not had any recent hospitalizations.  He continues to be on Zytiga 1 pill a day.  However patient/family complains of extreme fatigue to the point that patient is sleeping/slouching in the chair during the daytime.  No falls.   Family is concerned about ongoing memory issues.  Otherwise no nausea no vomiting.  No worsening swelling in the legs.    Family concerned about weight loss.  However patient admits to eating well.  Denies difficulty swallowing.  Family concerned about cough with mild phlegm.  No fevers.  Review of Systems  Constitutional: Positive for malaise/fatigue. Negative for chills, diaphoresis, fever and weight loss.  HENT: Negative for  nosebleeds and sore throat.   Eyes: Negative for double vision.  Respiratory: Positive for cough. Negative for hemoptysis, sputum production, shortness of breath and wheezing.   Cardiovascular: Negative for chest pain, palpitations and orthopnea.  Gastrointestinal: Negative for abdominal pain, blood in stool, constipation, diarrhea, heartburn, melena, nausea and vomiting.  Genitourinary: Negative for dysuria, frequency and urgency.  Musculoskeletal: Negative for back pain and joint pain.  Skin: Negative.  Negative for itching and rash.  Neurological: Negative for dizziness, tingling, focal weakness, weakness and headaches.  Endo/Heme/Allergies: Does not bruise/bleed easily.  Psychiatric/Behavioral: Positive for memory loss. Negative for depression. The patient is not nervous/anxious and does not have insomnia.       PAST MEDICAL HISTORY :  Past Medical History:  Diagnosis Date  . Aortic atherosclerosis ([place])    Noted on CT  . Arthritis   . Colon polyps    adenomatous  . History of fracture of tibia   . History of tachycardia    patient unaware of this  . Kyphosis of cervicothoracic region    SEVERE  . Osteoporosis 01/2014   T -3.8 (01/2014)  . Other constipation    chronic  . Prostate cancer [facility]) 1996   surgery then lupron  . Skin cancer, basal cell 2004   surgical resection.  Marland Kitchen Unspecified venous (peripheral) insufficiency   . Vitamin B12 deficiency     PAST SURGICAL HISTORY :   Past Surgical History:  Procedure Laterality Date  . BASAL CELL CARCINOMA EXCISION  2004   Left side of face  . CATARACT EXTRACTION Bilateral 2000, 2003   both eyes  .  COLONOSCOPY     h/o adenomatous polyps  . CYSTOSCOPY W/ RETROGRADES Bilateral 11/19/2016   Procedure: CYSTOSCOPY WITH RETROGRADE PYELOGRAM;  Surgeon: Hollice Espy, MD;  Location: [facility] ORS;  Service: Urology;  Laterality: Bilateral;  General vs. Spinal  . CYSTOSCOPY W/ URETERAL STENT PLACEMENT Left 02/25/2017   Procedure:  CYSTOSCOPY WITH STENT REPLACEMENT;  Surgeon: Hollice Espy, MD;  Location: [facility] OR[facility];  Service: Urology;  Laterality: Left;  . CYSTOSCOPY WITH URETEROSCOPY AND STENT PLACEMENT Left 11/19/2016   Procedure: CYSTOSCOPY WITH URETEROSCOPY AND STENT PLACEMENT;  Surgeon: Hollice Espy, MD;  Location: [facility] OR[facility];  Service: Urology;  Laterality: Left;  . ESOPHAGOGASTRODUODENOSCOPY N/A 10/31/2017   Procedure: ESOPHAGOGASTRODUODENOSCOPY (EGD) with biopsy and brushings;  Surgeon: Toledo, Benay Pike, MD;  Location: [facility];  Service: Gastroenterology;  Laterality: N/A;  . FRACTURE SURGERY    . JOINT REPLACEMENT  2005   Left knee  . JOINT REPLACEMENT  12/13   Right knee  . PROSTATE SURGERY  1996   cancer  . sigmoid resection    . TIBIA FRACTURE SURGERY  1999  . URETERAL BIOPSY Left 11/19/2016   Procedure: URETERAL BIOPSY;  Surgeon: Hollice Espy, MD;  Location: [facility] OR[facility];  Service: Urology;  Laterality: Left;  Marland Kitchen VOLVULUS REDUCTION  12/10    FAMILY HISTORY :   Family History  Problem Relation Age of Onset  . Leukemia Mother   . Diabetes Son   . Prostate cancer Neg Hx   . Bladder Cancer Neg Hx   . Kidney cancer Neg Hx     SOCIAL HISTORY:   Social History   Tobacco Use  . Smoking status: Never Smoker  . Smokeless tobacco: Never Used  Substance Use Topics  . Alcohol use: No    Alcohol/week: 0.0 - 1.0 standard drinks    Frequency: Never    Comment: in past  . Drug use: No    ALLERGIES:  is allergic to other.  MEDICATIONS:  Current Outpatient Medications  Medication Sig Dispense Refill  . abiraterone acetate (ZYTIGA) 250 MG tablet TAKE 1 TABLET (250 MG TOTAL) BY MOUTH DAILY. TAKE WITH A LOW FAT MEAL. 30 tablet 2  . aspirin EC 81 MG tablet Take 81 mg by mouth every other day.    . cyanocobalamin (,VITAMIN B-12,) 1000 MCG/ML injection Inject 1,000 mcg into the muscle every 30 (thirty) days.    Marland Kitchen denosumab (PROLIA) 60 MG/ML SOSY injection Inject 60 mg into the skin every 6 (six)  months.    . furosemide (LASIX) 40 MG tablet Take 1 tablet by mouth daily.    Marland Kitchen latanoprost (XALATAN) 0.005 % ophthalmic solution Place 1 drop into both eyes at bedtime.    . pantoprazole (PROTONIX) 40 MG tablet Take 1 tablet (40 mg total) by mouth daily. 60 tablet 0  . polyethylene glycol powder (GLYCOLAX/MIRALAX) powder FILL TO LINE (17 GRAMS ), MIX AND DRINK TWICE A DAY AND 8.5 GRAMS AT NOON 510 g 3  . sennosides-docusate sodium (SENOKOT-S) 8.6-50 MG tablet Take 4 tablets by mouth every evening.     . Skin Protectants, Misc. (EUCERIN) cream Apply 1 application topically 2 (two) times daily as needed for dry skin.    Marland Kitchen acetaminophen (TYLENOL) 325 MG tablet Take 325-650 mg by mouth every 4 (four) hours as needed for fever (general discomfort).     . clindamycin (CLEOCIN) 150 MG capsule Take 4 capsules by mouth as needed. Prior to dental work    . mupirocin ointment (BACTROBAN) 2 % Place 1  application into the nose daily.    . traMADol (ULTRAM) 50 MG tablet Take by mouth every 6 (six) hours as needed for moderate pain.    Marland Kitchen triamcinolone cream (KENALOG) 0.1 % Apply 1 application topically daily as needed.      No current facility-administered medications for this visit.    Facility-Administered Medications Ordered in Other Visits  Medication Dose Route Frequency Provider Last Rate Last Dose  . leuprolide (LUPRON) injection 30 mg  30 mg Intramuscular Once Leia Alf, MD        PHYSICAL EXAMINATION: ECOG PERFORMANCE STATUS: 2 - Symptomatic, <50% confined to bed  BP (!) 164/67 (BP Location: Left Arm, Patient Position: Sitting)   Pulse 70   Temp 98.2 F (36.8 C) (Oral)   Resp 20   Ht 6\' 2"  (1.88 m)   Wt 145 lb 4.8 oz (65.9 kg)   BMI 18.66 kg/m   Filed Weights   02/22/18 1453  Weight: 145 lb 4.8 oz (65.9 kg)    Physical Exam  Constitutional: He is oriented to person, place, and time and well-developed, well-nourished, and in no distress.  Elderly Caucasian male patient.  He  walks with a walker.  Accompanied by his 2 daughters.  HENT:  Head: Normocephalic and atraumatic.  Mouth/Throat: Oropharynx is clear and moist. No oropharyngeal exudate.  Eyes: Pupils are equal, round, and reactive to light.  Neck: Normal range of motion. Neck supple.  Cardiovascular: Normal rate and regular rhythm.  Pulmonary/Chest: Breath sounds normal. No respiratory distress. He has no wheezes.  Abdominal: Soft. Bowel sounds are normal. He exhibits no distension and no mass. There is no abdominal tenderness. There is no rebound and no guarding.  Musculoskeletal: Normal range of motion.        General: No tenderness.  Neurological: He is alert and oriented to person, place, and time.  Skin: Skin is warm.  Psychiatric: Affect normal.       LABORATORY DATA:  I have reviewed the data as listed    Component Value Date/Time   NA 139 02/22/2018 1341   NA 140 02/11/2012 0415   K 3.6 02/22/2018 1341   K 3.9 02/11/2012 0415   CL 102 02/22/2018 1341   CL 108 (H) 02/11/2012 0415   CO2 27 02/22/2018 1341   CO2 25 02/11/2012 0415   GLUCOSE 195 (H) 02/22/2018 1341   GLUCOSE 125 (H) 02/11/2012 0415   BUN 21 02/22/2018 1341   BUN 12 02/11/2012 0415   CREATININE 1.09 02/22/2018 1341   CREATININE 1.00 03/23/2014 1400   CALCIUM 8.4 (L) 02/22/2018 1341   CALCIUM 8.3 (L) 03/23/2014 1400   PROT 6.8 02/22/2018 1341   PROT 7.0 03/07/2014 1414   ALBUMIN 3.8 02/22/2018 1341   ALBUMIN 3.7 03/07/2014 1414   AST 19 02/22/2018 1341   AST 16 03/07/2014 1414   ALT 10 02/22/2018 1341   ALT 19 03/07/2014 1414   ALKPHOS 47 02/22/2018 1341   ALKPHOS 69 03/07/2014 1414   BILITOT 0.8 02/22/2018 1341   BILITOT 0.3 03/07/2014 1414   GFRNONAA 57 (L) 02/22/2018 1341   GFRNONAA >60 03/23/2014 1400   GFRNONAA 49 (L) 09/02/2013 1428   GFRAA >60 02/22/2018 1341   GFRAA >60 03/23/2014 1400   GFRAA 57 (L) 09/02/2013 1428    No results found for: SPEP, UPEP  Lab Results  Component Value Date   WBC  7.6 02/22/2018   NEUTROABS 4.3 02/22/2018   HGB 10.7 (L) 02/22/2018   HCT 32.8 (L)  02/22/2018   MCV 89.4 02/22/2018   PLT 151 02/22/2018      Chemistry      Component Value Date/Time   NA 139 02/22/2018 1341   NA 140 02/11/2012 0415   K 3.6 02/22/2018 1341   K 3.9 02/11/2012 0415   CL 102 02/22/2018 1341   CL 108 (H) 02/11/2012 0415   CO2 27 02/22/2018 1341   CO2 25 02/11/2012 0415   BUN 21 02/22/2018 1341   BUN 12 02/11/2012 0415   CREATININE 1.09 02/22/2018 1341   CREATININE 1.00 03/23/2014 1400   GLU 114 06/30/2016      Component Value Date/Time   CALCIUM 8.4 (L) 02/22/2018 1341   CALCIUM 8.3 (L) 03/23/2014 1400   ALKPHOS 47 02/22/2018 1341   ALKPHOS 69 03/07/2014 1414   AST 19 02/22/2018 1341   AST 16 03/07/2014 1414   ALT 10 02/22/2018 1341   ALT 19 03/07/2014 1414   BILITOT 0.8 02/22/2018 1341   BILITOT 0.3 03/07/2014 1414       RADIOGRAPHIC STUDIES: I have personally reviewed the radiological images as listed and agreed with the findings in the report. Dg Chest 2 View  Result Date: 02/22/2018 CLINICAL DATA:  Productive cough, chest congestion and increased fatigue over the past 2 days. History of tachycardia, prostate malignancy, never smoked. EXAM: CHEST - 2 VIEW COMPARISON:  CT scan of the chest of October 31, 2017 and PA chest x-ray of October 30, 2017. FINDINGS: The lungs are mildly hyperinflated. The interstitial markings are coarse. There is no alveolar infiltrate or pleural effusion. The heart and pulmonary vascularity are normal. The mediastinum is normal in width. There is calcification in the wall of the aortic arch. There is mild multilevel degenerative disc disease of the thoracic spine. IMPRESSION: Mild chronic bronchitic changes, stable. No alveolar pneumonia, pulmonary edema, nor other acute cardiopulmonary abnormality. Thoracic aortic atherosclerosis. Electronically Signed   By: David  Martinique M.D.   On: 02/22/2018 16:28     ASSESSMENT & PLAN:   Prostate cancer ([place]) # CASTRATE RESISTANT PROSTATE CANCER- Metastatic disease with left bladder invasion/obstruction.   NOV 2019 PSA 0.50; HOLD Zytiga/lupron- sec to fatigue/pt pref. [See below]  #Cough hoarseness of voice- ?  Reflux issues versus others.  Check chest x-ray today.  Recommend continue Protonix morning hour prior to breakfast.  #Fatigue-question related to Zytiga versus others.  Given the good response to current therapy reasonable to hold Zytiga/Lupron at this time.  # Severe osteoporosis: Currently on Prolia; stable  # Left ureteral stent placement/hydronephrosis: Status post exchange.  Stable renal function.  # Iron deficiency anemia: Stable IV iron as needed [pill esophagitis]  # weight loss-unclear etiology.  Recommend dietitian referral at the nursing home.  # palliative care discussion-we will review at next visit.  #DISPOSITION:  # CXR today.  # Follow-up- in 6 weeks MD labs-CBC CMP PSA; possible lupron-Dr.B  Addendum chest x-ray-negative for any acute process chronic bronchitic changes.  Family to be informed.   Orders Placed This Encounter  Procedures  . DG Chest 2 View    Standing Status:   Future    Number of Occurrences:   1    Standing Expiration Date:   04/24/2019    Order Specific Question:   Reason for Exam (SYMPTOM  OR DIAGNOSIS REQUIRED)    Answer:   cough    Order Specific Question:   Preferred imaging location?    Answer:   [facility]   All questions were answered.  The patient knows to call the clinic with any problems, questions or concerns.      Cammie Sickle, MD 02/23/2018 9:15 AM

## 2018-02-23 ENCOUNTER — Telehealth: Payer: Self-pay | Admitting: *Deleted

## 2018-02-23 ENCOUNTER — Telehealth: Payer: Self-pay | Admitting: Internal Medicine

## 2018-02-23 NOTE — Telephone Encounter (Signed)
Received a cal from both daughters and the director from [facility] asking for CXR results and asking if patient needs to be on antibiotics. Please return call to [place] DATA:  Productive cough, chest congestion and increased fatigue over the past 2 days. History of tachycardia, prostate malignancy, never smoked.  EXAM: CHEST - 2 VIEW  COMPARISON:  CT scan of the chest of October 31, 2017 and PA chest x-ray of October 30, 2017.  FINDINGS: The lungs are mildly hyperinflated. The interstitial markings are coarse. There is no alveolar infiltrate or pleural effusion. The heart and pulmonary vascularity are normal. The mediastinum is normal in width. There is calcification in the wall of the aortic arch. There is mild multilevel degenerative disc disease of the thoracic spine.  IMPRESSION: Mild chronic bronchitic changes, stable. No alveolar pneumonia, pulmonary edema, nor other acute cardiopulmonary abnormality.  Thoracic aortic atherosclerosis.   Electronically Signed   By: David  Martinique M.D.   On: 02/22/2018 16:28

## 2018-02-23 NOTE — Telephone Encounter (Signed)
Heather-please inform daughter that chest x-ray negative for any pneumonia.  For now recommend Protonix as discussed.  #Also please inform that PSA-continues to get better at 0.35.  So okay to continue to hold Zytiga at this time.  #Please also discussed with the daughter regarding palliative care introduction with Josh at next visit.  If they are interested-please schedule an appointment at next visit with [facility].  Thanks, GB

## 2018-02-23 NOTE — Telephone Encounter (Signed)
Spoke with daughter Anne-results provided and plan of care discussed.

## 2018-02-23 NOTE — Telephone Encounter (Signed)
Hassan Rowan - phone call returned. Please see previous phone call note

## 2018-04-05 ENCOUNTER — Encounter (INDEPENDENT_AMBULATORY_CARE_PROVIDER_SITE_OTHER): Payer: Self-pay | Admitting: Vascular Surgery

## 2018-04-05 ENCOUNTER — Inpatient Hospital Stay (HOSPITAL_BASED_OUTPATIENT_CLINIC_OR_DEPARTMENT_OTHER): Payer: Medicare Other | Admitting: Hospice and Palliative Medicine

## 2018-04-05 ENCOUNTER — Inpatient Hospital Stay: Payer: Medicare Other

## 2018-04-05 ENCOUNTER — Encounter: Payer: Self-pay | Admitting: Internal Medicine

## 2018-04-05 ENCOUNTER — Other Ambulatory Visit: Payer: Self-pay | Admitting: Internal Medicine

## 2018-04-05 ENCOUNTER — Ambulatory Visit (INDEPENDENT_AMBULATORY_CARE_PROVIDER_SITE_OTHER): Payer: Medicare Other | Admitting: Vascular Surgery

## 2018-04-05 ENCOUNTER — Other Ambulatory Visit: Payer: Self-pay

## 2018-04-05 ENCOUNTER — Inpatient Hospital Stay (HOSPITAL_BASED_OUTPATIENT_CLINIC_OR_DEPARTMENT_OTHER): Payer: Medicare Other | Admitting: Internal Medicine

## 2018-04-05 ENCOUNTER — Telehealth: Payer: Self-pay | Admitting: Internal Medicine

## 2018-04-05 ENCOUNTER — Inpatient Hospital Stay: Payer: Medicare Other | Attending: Internal Medicine

## 2018-04-05 VITALS — BP 102/62 | HR 79 | Resp 14 | Ht 72.0 in | Wt 177.0 lb

## 2018-04-05 VITALS — BP 147/70 | HR 58 | Resp 16 | Wt 179.5 lb

## 2018-04-05 DIAGNOSIS — Z85828 Personal history of other malignant neoplasm of skin: Secondary | ICD-10-CM | POA: Diagnosis not present

## 2018-04-05 DIAGNOSIS — M129 Arthropathy, unspecified: Secondary | ICD-10-CM | POA: Diagnosis not present

## 2018-04-05 DIAGNOSIS — I700 Atherosclerosis of aorta: Secondary | ICD-10-CM

## 2018-04-05 DIAGNOSIS — C778 Secondary and unspecified malignant neoplasm of lymph nodes of multiple regions: Secondary | ICD-10-CM | POA: Diagnosis not present

## 2018-04-05 DIAGNOSIS — D509 Iron deficiency anemia, unspecified: Secondary | ICD-10-CM | POA: Insufficient documentation

## 2018-04-05 DIAGNOSIS — E538 Deficiency of other specified B group vitamins: Secondary | ICD-10-CM

## 2018-04-05 DIAGNOSIS — Z192 Hormone resistant malignancy status: Secondary | ICD-10-CM | POA: Diagnosis not present

## 2018-04-05 DIAGNOSIS — R5383 Other fatigue: Secondary | ICD-10-CM | POA: Insufficient documentation

## 2018-04-05 DIAGNOSIS — Z7982 Long term (current) use of aspirin: Secondary | ICD-10-CM | POA: Diagnosis not present

## 2018-04-05 DIAGNOSIS — Z515 Encounter for palliative care: Secondary | ICD-10-CM | POA: Diagnosis not present

## 2018-04-05 DIAGNOSIS — Z86010 Personal history of colonic polyps: Secondary | ICD-10-CM | POA: Diagnosis not present

## 2018-04-05 DIAGNOSIS — C61 Malignant neoplasm of prostate: Secondary | ICD-10-CM

## 2018-04-05 DIAGNOSIS — I872 Venous insufficiency (chronic) (peripheral): Secondary | ICD-10-CM | POA: Diagnosis not present

## 2018-04-05 DIAGNOSIS — M159 Polyosteoarthritis, unspecified: Secondary | ICD-10-CM

## 2018-04-05 DIAGNOSIS — Z7189 Other specified counseling: Secondary | ICD-10-CM

## 2018-04-05 DIAGNOSIS — M150 Primary generalized (osteo)arthritis: Secondary | ICD-10-CM | POA: Diagnosis not present

## 2018-04-05 DIAGNOSIS — R000 Tachycardia, unspecified: Secondary | ICD-10-CM

## 2018-04-05 DIAGNOSIS — Z79899 Other long term (current) drug therapy: Secondary | ICD-10-CM

## 2018-04-05 DIAGNOSIS — Z806 Family history of leukemia: Secondary | ICD-10-CM | POA: Diagnosis not present

## 2018-04-05 DIAGNOSIS — M810 Age-related osteoporosis without current pathological fracture: Secondary | ICD-10-CM | POA: Diagnosis not present

## 2018-04-05 DIAGNOSIS — K5900 Constipation, unspecified: Secondary | ICD-10-CM | POA: Insufficient documentation

## 2018-04-05 DIAGNOSIS — I890 Lymphedema, not elsewhere classified: Secondary | ICD-10-CM | POA: Diagnosis not present

## 2018-04-05 LAB — COMPREHENSIVE METABOLIC PANEL
ALT: 11 U/L (ref 0–44)
AST: 19 U/L (ref 15–41)
Albumin: 4.1 g/dL (ref 3.5–5.0)
Alkaline Phosphatase: 49 U/L (ref 38–126)
Anion gap: 8 (ref 5–15)
BUN: 25 mg/dL — ABNORMAL HIGH (ref 8–23)
CO2: 25 mmol/L (ref 22–32)
Calcium: 8.7 mg/dL — ABNORMAL LOW (ref 8.9–10.3)
Chloride: 104 mmol/L (ref 98–111)
Creatinine, Ser: 1.02 mg/dL (ref 0.61–1.24)
GFR calc non Af Amer: >60 mL/min (ref >60)
Glucose, Bld: 190 mg/dL — ABNORMAL HIGH (ref 70–99)
POTASSIUM: 3.9 mmol/L (ref 3.5–5.1)
SODIUM: 137 mmol/L (ref 135–145)
Total Bilirubin: 0.4 mg/dL (ref 0.3–1.2)
Total Protein: 7 g/dL (ref 6.5–8.1)

## 2018-04-05 LAB — CBC WITH DIFFERENTIAL/PLATELET
Abs Immature Granulocytes: 0.01 10*3/uL (ref 0.00–0.07)
Basophils Absolute: 0.1 10*3/uL (ref 0.0–0.1)
Basophils Relative: 1 %
Eosinophils Absolute: 0.3 10*3/uL (ref 0.0–0.5)
Eosinophils Relative: 5 %
HCT: 35.6 % — ABNORMAL LOW (ref 39.0–52.0)
HEMOGLOBIN: 11.7 g/dL — AB (ref 13.0–17.0)
Immature Granulocytes: 0 %
LYMPHS PCT: 24 %
Lymphs Abs: 1.7 10*3/uL (ref 0.7–4.0)
MCH: 29 pg (ref 26.0–34.0)
MCHC: 32.9 g/dL (ref 30.0–36.0)
MCV: 88.1 fL (ref 80.0–100.0)
Monocytes Absolute: 0.6 10*3/uL (ref 0.1–1.0)
Monocytes Relative: 8 %
Neutro Abs: 4.4 10*3/uL (ref 1.7–7.7)
Neutrophils Relative %: 62 %
Platelets: 149 10*3/uL — ABNORMAL LOW (ref 150–400)
RBC: 4.04 MIL/uL — AB (ref 4.22–5.81)
RDW: 13.8 % (ref 11.5–15.5)
WBC: 7.1 10*3/uL (ref 4.0–10.5)
nRBC: 0 % (ref 0.0–0.2)

## 2018-04-05 LAB — PSA: Prostatic Specific Antigen: 0.38 ng/mL (ref 0.00–4.00)

## 2018-04-05 NOTE — Progress Notes (Signed)
MRN : [id number]  Benjamin Villarreal is a 85 y.o. (07-Dec-1920) male who presents with chief complaint of  Chief Complaint  Patient presents with  . Follow-up  .  History of Present Illness:   The patient returns to the office for followup evaluation regarding leg swelling.  The swelling has persisted but with the lymph pump is much, much better controlled. The pain associated with swelling is essentially eliminated. There have not been any interval development of a ulcerations or wounds.  The patient denies problems with the pump, noting it is working well and the leggings are in good condition.  Since the previous visit the patient has been wearing graduated compression stockings and using the lymph pump on a routine basis and  has noted significant improvement in the lymphedema.   Patient stated the lymph pump has been a very positive factor in her care.    Current Meds  Medication Sig  . acetaminophen (TYLENOL) 325 MG tablet Take 325-650 mg by mouth every 4 (four) hours as needed for fever (general discomfort).   Marland Kitchen aspirin EC 81 MG tablet Take 81 mg by mouth every other day.  . clindamycin (CLEOCIN) 150 MG capsule Take 4 capsules by mouth as needed. Prior to dental work  . cyanocobalamin (,VITAMIN B-12,) 1000 MCG/ML injection Inject 1,000 mcg into the muscle every 30 (thirty) days.  Marland Kitchen denosumab (PROLIA) 60 MG/ML SOSY injection Inject 60 mg into the skin every 6 (six) months.  . furosemide (LASIX) 40 MG tablet Take 1 tablet by mouth daily.  Marland Kitchen latanoprost (XALATAN) 0.005 % ophthalmic solution Place 1 drop into both eyes at bedtime.  . mupirocin ointment (BACTROBAN) 2 % Place 1 application into the nose daily.  . pantoprazole (PROTONIX) 40 MG tablet Take 1 tablet (40 mg total) by mouth daily.  . polyethylene glycol powder (GLYCOLAX/MIRALAX) powder FILL TO LINE (17 GRAMS ), MIX AND DRINK TWICE A DAY AND 8.5 GRAMS AT NOON  . sennosides-docusate sodium (SENOKOT-S) 8.6-50 MG tablet Take 4  tablets by mouth every evening.   . Skin Protectants, Misc. (EUCERIN) cream Apply 1 application topically 2 (two) times daily as needed for dry skin.  Marland Kitchen traMADol (ULTRAM) 50 MG tablet Take by mouth every 6 (six) hours as needed for moderate pain.  Marland Kitchen triamcinolone cream (KENALOG) 0.1 % Apply 1 application topically daily as needed.     Past Medical History:  Diagnosis Date  . Aortic atherosclerosis ([place])    Noted on CT  . Arthritis   . Colon polyps    adenomatous  . History of fracture of tibia   . History of tachycardia    patient unaware of this  . Kyphosis of cervicothoracic region    SEVERE  . Osteoporosis 01/2014   T -3.8 (01/2014)  . Other constipation    chronic  . Prostate cancer [facility]) 1996   surgery then lupron  . Skin cancer, basal cell 2004   surgical resection.  Marland Kitchen Unspecified venous (peripheral) insufficiency   . Vitamin B12 deficiency     Past Surgical History:  Procedure Laterality Date  . BASAL CELL CARCINOMA EXCISION  2004   Left side of face  . CATARACT EXTRACTION Bilateral 2000, 2003   both eyes  . COLONOSCOPY     h/o adenomatous polyps  . CYSTOSCOPY W/ RETROGRADES Bilateral 11/19/2016   Procedure: CYSTOSCOPY WITH RETROGRADE PYELOGRAM;  Surgeon: Hollice Espy, MD;  Location: [facility] OR[facility];  Service: Urology;  Laterality: Bilateral;  General vs. Spinal  .  CYSTOSCOPY W/ URETERAL STENT PLACEMENT Left 02/25/2017   Procedure: CYSTOSCOPY WITH STENT REPLACEMENT;  Surgeon: Hollice Espy, MD;  Location: [facility] OR[facility];  Service: Urology;  Laterality: Left;  . CYSTOSCOPY WITH URETEROSCOPY AND STENT PLACEMENT Left 11/19/2016   Procedure: CYSTOSCOPY WITH URETEROSCOPY AND STENT PLACEMENT;  Surgeon: Hollice Espy, MD;  Location: [facility] OR[facility];  Service: Urology;  Laterality: Left;  . ESOPHAGOGASTRODUODENOSCOPY N/A 10/31/2017   Procedure: ESOPHAGOGASTRODUODENOSCOPY (EGD) with biopsy and brushings;  Surgeon: Toledo, Benay Pike, MD;  Location: [facility];  Service: Gastroenterology;   Laterality: N/A;  . FRACTURE SURGERY    . JOINT REPLACEMENT  2005   Left knee  . JOINT REPLACEMENT  12/13   Right knee  . PROSTATE SURGERY  1996   cancer  . sigmoid resection    . TIBIA FRACTURE SURGERY  1999  . URETERAL BIOPSY Left 11/19/2016   Procedure: URETERAL BIOPSY;  Surgeon: Hollice Espy, MD;  Location: [facility] OR[facility];  Service: Urology;  Laterality: Left;  Marland Kitchen VOLVULUS REDUCTION  12/10    Social History Social History   Tobacco Use  . Smoking status: Never Smoker  . Smokeless tobacco: Never Used  Substance Use Topics  . Alcohol use: No    Alcohol/week: 0.0 - 1.0 standard drinks    Frequency: Never    Comment: in past  . Drug use: No    Family History Family History  Problem Relation Age of Onset  . Leukemia Mother   . Diabetes Son   . Prostate cancer Neg Hx   . Bladder Cancer Neg Hx   . Kidney cancer Neg Hx     Allergies  Allergen Reactions  . Other     RAW ONIONS- SINUS REACTION     REVIEW OF SYSTEMS (Negative unless checked)  Constitutional: [] Weight loss  [] Fever  [] Chills Cardiac: [] Chest pain   [] Chest pressure   [] Palpitations   [] Shortness of breath when laying flat   [] Shortness of breath with exertion. Vascular:  [] Pain in legs with walking   [x] Pain in legs with dependency  [] History of DVT   [] Phlebitis   [x] Swelling in legs   [] Varicose veins   [] Non-healing ulcers Pulmonary:   [] Uses home oxygen   [] Productive cough   [] Hemoptysis   [] Wheeze  [] COPD   [] Asthma Neurologic:  [] Dizziness   [] Seizures   [] History of stroke   [] History of TIA  [] Aphasia   [] Vissual changes   [] Weakness or numbness in arm   [x] Weakness or numbness in leg Musculoskeletal:   [] Joint swelling   [x] Joint pain   [] Low back pain Hematologic:  [] Easy bruising  [] Easy bleeding   [] Hypercoagulable state   [] Anemic Gastrointestinal:  [] Diarrhea   [] Vomiting  [] Gastroesophageal reflux/heartburn   [] Difficulty swallowing. Genitourinary:  [] Chronic kidney disease   [] Difficult  urination  [] Frequent urination   [] Blood in urine Skin:  [x] Rashes   [] Ulcers  Psychological:  [] History of anxiety   []  History of major depression.  Physical Examination  Vitals:   04/05/18 1013  BP: 102/62  Pulse: 79  Resp: 14  Weight: 177 lb (80.3 kg)  Height: 6' (1.829 m)   Body mass index is 24.01 kg/m. Gen: WD/WN, NAD, frail in a wheelchair Head: [place]/AT, No temporalis wasting.  Ear/Nose/Throat: Hearing grossly intact, nares w/o erythema or drainage Eyes: PER, EOMI, sclera nonicteric.  Neck: Supple, no large masses.   Pulmonary:  Good air movement, no audible wheezing bilaterally, no use of accessory muscles.  Cardiac: RRR, no JVD Vascular: scattered varicosities present  bilaterally.  Mild venous stasis changes to the legs bilaterally.  3+ soft pitting edema Vessel Right Left  Radial Palpable Palpable  PT Not Palpable Not Palpable  DP Trace Palpable Trace Palpable  Gastrointestinal: Non-distended. No guarding/no peritoneal signs.  Musculoskeletal: M/S 5/5 throughout.  No deformity or atrophy.  Neurologic: CN 2-12 intact. Symmetrical.  Speech is fluent. Motor exam as listed above. Psychiatric: Judgment intact, Mood & affect appropriate for pt's clinical situation. Dermatologic: mild venous rashes no ulcers noted.  No changes consistent with cellulitis. Lymph : No lichenification or skin changes of chronic lymphedema.  CBC Lab Results  Component Value Date   WBC 7.1 04/05/2018   HGB 11.7 (L) 04/05/2018   HCT 35.6 (L) 04/05/2018   MCV 88.1 04/05/2018   PLT 149 (L) 04/05/2018    BMET    Component Value Date/Time   NA 137 04/05/2018 1245   NA 140 02/11/2012 0415   K 3.9 04/05/2018 1245   K 3.9 02/11/2012 0415   CL 104 04/05/2018 1245   CL 108 (H) 02/11/2012 0415   CO2 25 04/05/2018 1245   CO2 25 02/11/2012 0415   GLUCOSE 190 (H) 04/05/2018 1245   GLUCOSE 125 (H) 02/11/2012 0415   BUN 25 (H) 04/05/2018 1245   BUN 12 02/11/2012 0415   CREATININE 1.02  04/05/2018 1245   CREATININE 1.00 03/23/2014 1400   CALCIUM 8.7 (L) 04/05/2018 1245   CALCIUM 8.3 (L) 03/23/2014 1400   GFRNONAA >60 04/05/2018 1245   GFRNONAA >60 03/23/2014 1400   GFRNONAA 49 (L) 09/02/2013 1428   GFRAA >60 04/05/2018 1245   GFRAA >60 03/23/2014 1400   GFRAA 57 (L) 09/02/2013 1428   Estimated Creatinine Clearance: 47.5 mL/min (by C-G formula based on SCr of 1.02 mg/dL).  COAG Lab Results  Component Value Date   INR 0.9 02/02/2012    Radiology No results found.   Assessment/Plan 1. Lymphedema  No surgery or intervention at this point in time.    I have reviewed my discussion with the patient regarding lymphedema and why it  causes symptoms.  Patient will continue wearing graduated compression stockings class 1 (20-30 mmHg) on a daily basis a prescription was given. The patient is reminded to put the stockings on first thing in the morning and removing them in the evening. The patient is instructed specifically not to sleep in the stockings.   In addition, behavioral modification throughout the day will be continued.  This will include frequent elevation (such as in a recliner), use of over the counter pain medications as needed and exercise such as walking.  I have reviewed systemic causes for chronic edema such as liver, kidney and cardiac etiologies and there does not appear to be any significant changes in these organ systems over the past year.  The patient is under the impression that these organ systems are all stable and unchanged.    The patient will continue aggressive use of the  lymph pump.  This will continue to improve the edema control and prevent sequela such as ulcers and infections.   The patient will follow-up with me on an annual basis.    2. Chronic venous insufficiency  No surgery or intervention at this point in time.    I have reviewed my discussion with the patient regarding lymphedema and why it  causes symptoms.  Patient will  continue wearing graduated compression stockings class 1 (20-30 mmHg) on a daily basis a prescription was given. The patient is reminded to  put the stockings on first thing in the morning and removing them in the evening. The patient is instructed specifically not to sleep in the stockings.   In addition, behavioral modification throughout the day will be continued.  This will include frequent elevation (such as in a recliner), use of over the counter pain medications as needed and exercise such as walking.  I have reviewed systemic causes for chronic edema such as liver, kidney and cardiac etiologies and there does not appear to be any significant changes in these organ systems over the past year.  The patient is under the impression that these organ systems are all stable and unchanged.    The patient will continue aggressive use of the  lymph pump.  This will continue to improve the edema control and prevent sequela such as ulcers and infections.   The patient will follow-up with me on an annual basis.    3. Aortic atherosclerosis ([facility])  Recommend:  The patient has evidence of atherosclerosis of the lower extremities with claudication.  The patient does not voice lifestyle limiting changes at this point in time.  Noninvasive studies do not suggest clinically significant change.  No invasive studies, angiography or surgery at this time The patient should continue walking and begin a more formal exercise program.  The patient should continue antiplatelet therapy and aggressive treatment of the lipid abnormalities  No changes in the patient's medications at this time  The patient should continue wearing graduated compression socks 10-15 mmHg strength to control the mild edema.    4. Primary osteoarthritis involving multiple joints Continue NSAID medications as already ordered, these medications have been reviewed and there are no changes at this time.  Continued activity and therapy was  stressed.     Hortencia Pilar, MD  04/05/2018 8:06 PM

## 2018-04-05 NOTE — Assessment & Plan Note (Addendum)
#   CASTRATE RESISTANT PROSTATE CANCER- Metastatic disease with left bladder invasion/obstruction.   NOV 2019 PSA 0.50;   #Continue to hold Zytiga/lupron- sec to fatigue/pt pref. [See below] also hold Lupron for worsening fatigue.  Await PSA from today.  #Fatigue likely secondary to antiandrogen therapy-hold off Lupron/Zytiga for now.  # Severe osteoporosis: Currently on Prolia [last July 2019]; stable  # Left ureteral stent placement/hydronephrosis: Status post exchange.  Stable  # Iron deficiency anemia: Status post IV iron stable.  # palliative care discussion-today/in general goals of care.  #DISPOSITION:  # HOLD Lupron today. # Follow-up- in 6 weeks MD labs-CBC CMP PSA; possible lupron-Dr.B; add prolia.

## 2018-04-05 NOTE — Telephone Encounter (Signed)
Please add prolia to next visit-Thx GB

## 2018-04-05 NOTE — Progress Notes (Signed)
[place] OFFICE PROGRESS NOTE  Patient Care Team: Venia Carbon, MD as PCP - General  Cancer Staging No matching staging information was found for the patient.   Oncology History    # 1996 PROSTATE CANCER [Gleason score 2+3] s/p Radical Prostatectomy [[facility]]; ?Biochem RECURRENT PROSTATE CANCER-Hormone sensitive;  Intermittent Lupron; on Lupron q 51M [Jan 2016 PSA- 7.9].  HOLD LUPRON in MAY 2017; AUg 2017- Re-start  # OCT 2018- Xtandi 3 pills a day; OCT 30th PET- Retroperitoneal/pelvic adenopathy/recurrence lateral bladder wall; NO Bone lesions; SEP 2019- STOP X-tandi sec difficulty swallowing  # sep 19th START Zytiga 1 pill/day  # SEP 2019- Dysphagia/pill esophagitis [s/p EGD Dr.Toledo]  # Sep 2019- Zytiga 250 mg/day   # Osteoporosis [[facility]- 2015]- on Reclast q 17M; FEB 2017- Start Prolia q 6 M -------------------------------------------------------------   DIAGNOSIS: Recurrent metastatic prostate cancer castrate resistant  STAGE:   4      ;GOALS: Palliative  CURRENT/MOST RECENT THERAPY : Zytiga [ordered sep19th]      Prostate cancer ([place])    INTERVAL HISTORY:  Benjamin Villarreal 85 y.o.  male pleasant patient above history of metastatic castrate resistant prostate cancer is here for follow-up.   He is currently off Zytiga because of his ongoing fatigue.  Daughter is concerned about Lupron causing fatigue.  Patient has not had any hospitalizations.  Appetite is good.   Fatigue slightly improved since coming off Zytiga.  Still concerned about memory loss.   Review of Systems  Constitutional: Positive for malaise/fatigue. Negative for chills, diaphoresis, fever and weight loss.  HENT: Negative for nosebleeds and sore throat.   Eyes: Negative for double vision.  Respiratory: Negative for hemoptysis, sputum production, shortness of breath and wheezing.   Cardiovascular: Negative for chest pain, palpitations and orthopnea.  Gastrointestinal: Negative for  abdominal pain, blood in stool, constipation, diarrhea, heartburn, melena, nausea and vomiting.  Genitourinary: Negative for dysuria, frequency and urgency.  Musculoskeletal: Negative for back pain and joint pain.  Skin: Negative.  Negative for itching and rash.  Neurological: Negative for dizziness, tingling, focal weakness, weakness and headaches.  Endo/Heme/Allergies: Does not bruise/bleed easily.  Psychiatric/Behavioral: Positive for memory loss. Negative for depression. The patient is not nervous/anxious and does not have insomnia.       PAST MEDICAL HISTORY :  Past Medical History:  Diagnosis Date  . Aortic atherosclerosis ([place])    Noted on CT  . Arthritis   . Colon polyps    adenomatous  . History of fracture of tibia   . History of tachycardia    patient unaware of this  . Kyphosis of cervicothoracic region    SEVERE  . Osteoporosis 01/2014   T -3.8 (01/2014)  . Other constipation    chronic  . Prostate cancer [facility]) 1996   surgery then lupron  . Skin cancer, basal cell 2004   surgical resection.  Marland Kitchen Unspecified venous (peripheral) insufficiency   . Vitamin B12 deficiency     PAST SURGICAL HISTORY :   Past Surgical History:  Procedure Laterality Date  . BASAL CELL CARCINOMA EXCISION  2004   Left side of face  . CATARACT EXTRACTION Bilateral 2000, 2003   both eyes  . COLONOSCOPY     h/o adenomatous polyps  . CYSTOSCOPY W/ RETROGRADES Bilateral 11/19/2016   Procedure: CYSTOSCOPY WITH RETROGRADE PYELOGRAM;  Surgeon: Hollice Espy, MD;  Location: [facility];  Service: Urology;  Laterality: Bilateral;  General vs. Spinal  . CYSTOSCOPY W/ URETERAL STENT PLACEMENT Left  02/25/2017   Procedure: CYSTOSCOPY WITH STENT REPLACEMENT;  Surgeon: Hollice Espy, MD;  Location: [facility];  Service: Urology;  Laterality: Left;  . CYSTOSCOPY WITH URETEROSCOPY AND STENT PLACEMENT Left 11/19/2016   Procedure: CYSTOSCOPY WITH URETEROSCOPY AND STENT PLACEMENT;  Surgeon: Hollice Espy,  MD;  Location: [facility];  Service: Urology;  Laterality: Left;  . ESOPHAGOGASTRODUODENOSCOPY N/A 10/31/2017   Procedure: ESOPHAGOGASTRODUODENOSCOPY (EGD) with biopsy and brushings;  Surgeon: Toledo, Benay Pike, MD;  Location: [facility];  Service: Gastroenterology;  Laterality: N/A;  . FRACTURE SURGERY    . JOINT REPLACEMENT  2005   Left knee  . JOINT REPLACEMENT  12/13   Right knee  . PROSTATE SURGERY  1996   cancer  . sigmoid resection    . TIBIA FRACTURE SURGERY  1999  . URETERAL BIOPSY Left 11/19/2016   Procedure: URETERAL BIOPSY;  Surgeon: Hollice Espy, MD;  Location: [facility];  Service: Urology;  Laterality: Left;  Marland Kitchen VOLVULUS REDUCTION  12/10    FAMILY HISTORY :   Family History  Problem Relation Age of Onset  . Leukemia Mother   . Diabetes Son   . Prostate cancer Neg Hx   . Bladder Cancer Neg Hx   . Kidney cancer Neg Hx     SOCIAL HISTORY:   Social History   Tobacco Use  . Smoking status: Never Smoker  . Smokeless tobacco: Never Used  Substance Use Topics  . Alcohol use: No    Alcohol/week: 0.0 - 1.0 standard drinks    Frequency: Never    Comment: in past  . Drug use: No    ALLERGIES:  is allergic to other.  MEDICATIONS:  Current Outpatient Medications  Medication Sig Dispense Refill  . abiraterone acetate (ZYTIGA) 250 MG tablet TAKE 1 TABLET (250 MG TOTAL) BY MOUTH DAILY. TAKE WITH A LOW FAT MEAL. 30 tablet 2  . acetaminophen (TYLENOL) 325 MG tablet Take 325-650 mg by mouth every 4 (four) hours as needed for fever (general discomfort).     Marland Kitchen aspirin EC 81 MG tablet Take 81 mg by mouth every other day.    . clindamycin (CLEOCIN) 150 MG capsule Take 4 capsules by mouth as needed. Prior to dental work    . cyanocobalamin (,VITAMIN B-12,) 1000 MCG/ML injection Inject 1,000 mcg into the muscle every 30 (thirty) days.    Marland Kitchen denosumab (PROLIA) 60 MG/ML SOSY injection Inject 60 mg into the skin every 6 (six) months.    . furosemide (LASIX) 40 MG tablet Take 1  tablet by mouth daily.    Marland Kitchen latanoprost (XALATAN) 0.005 % ophthalmic solution Place 1 drop into both eyes at bedtime.    . mupirocin ointment (BACTROBAN) 2 % Place 1 application into the nose daily.    . pantoprazole (PROTONIX) 40 MG tablet Take 1 tablet (40 mg total) by mouth daily. 60 tablet 0  . polyethylene glycol powder (GLYCOLAX/MIRALAX) powder FILL TO LINE (17 GRAMS ), MIX AND DRINK TWICE A DAY AND 8.5 GRAMS AT NOON 510 g 3  . sennosides-docusate sodium (SENOKOT-S) 8.6-50 MG tablet Take 4 tablets by mouth every evening.     . Skin Protectants, Misc. (EUCERIN) cream Apply 1 application topically 2 (two) times daily as needed for dry skin.    Marland Kitchen traMADol (ULTRAM) 50 MG tablet Take by mouth every 6 (six) hours as needed for moderate pain.    Marland Kitchen triamcinolone cream (KENALOG) 0.1 % Apply 1 application topically daily as needed.      No current  facility-administered medications for this visit.    Facility-Administered Medications Ordered in Other Visits  Medication Dose Route Frequency Provider Last Rate Last Dose  . leuprolide (LUPRON) injection 30 mg  30 mg Intramuscular Once Leia Alf, MD        PHYSICAL EXAMINATION: ECOG PERFORMANCE STATUS: 2 - Symptomatic, <50% confined to bed  BP (!) 147/70 (BP Location: Left Arm, Patient Position: Sitting, Cuff Size: Normal)   Pulse (!) 58   Resp 16   Wt 179 lb 8 oz (81.4 kg)   BMI 24.34 kg/m   Filed Weights   04/05/18 1338  Weight: 179 lb 8 oz (81.4 kg)    Physical Exam  Constitutional: He is oriented to person, place, and time and well-developed, well-nourished, and in no distress.  Elderly Caucasian male patient.  He walks with a walker.  Accompanied by his 2 daughters.  HENT:  Head: Normocephalic and atraumatic.  Mouth/Throat: Oropharynx is clear and moist. No oropharyngeal exudate.  Eyes: Pupils are equal, round, and reactive to light.  Neck: Normal range of motion. Neck supple.  Cardiovascular: Normal rate and regular rhythm.   Pulmonary/Chest: Breath sounds normal. No respiratory distress. He has no wheezes.  Abdominal: Soft. Bowel sounds are normal. He exhibits no distension and no mass. There is no abdominal tenderness. There is no rebound and no guarding.  Musculoskeletal: Normal range of motion.        General: No tenderness.  Neurological: He is alert and oriented to person, place, and time.  Skin: Skin is warm.  Psychiatric: Affect normal.       LABORATORY DATA:  I have reviewed the data as listed    Component Value Date/Time   NA 137 04/05/2018 1245   NA 140 02/11/2012 0415   K 3.9 04/05/2018 1245   K 3.9 02/11/2012 0415   CL 104 04/05/2018 1245   CL 108 (H) 02/11/2012 0415   CO2 25 04/05/2018 1245   CO2 25 02/11/2012 0415   GLUCOSE 190 (H) 04/05/2018 1245   GLUCOSE 125 (H) 02/11/2012 0415   BUN 25 (H) 04/05/2018 1245   BUN 12 02/11/2012 0415   CREATININE 1.02 04/05/2018 1245   CREATININE 1.00 03/23/2014 1400   CALCIUM 8.7 (L) 04/05/2018 1245   CALCIUM 8.3 (L) 03/23/2014 1400   PROT 7.0 04/05/2018 1245   PROT 7.0 03/07/2014 1414   ALBUMIN 4.1 04/05/2018 1245   ALBUMIN 3.7 03/07/2014 1414   AST 19 04/05/2018 1245   AST 16 03/07/2014 1414   ALT 11 04/05/2018 1245   ALT 19 03/07/2014 1414   ALKPHOS 49 04/05/2018 1245   ALKPHOS 69 03/07/2014 1414   BILITOT 0.4 04/05/2018 1245   BILITOT 0.3 03/07/2014 1414   GFRNONAA >60 04/05/2018 1245   GFRNONAA >60 03/23/2014 1400   GFRNONAA 49 (L) 09/02/2013 1428   GFRAA >60 04/05/2018 1245   GFRAA >60 03/23/2014 1400   GFRAA 57 (L) 09/02/2013 1428    No results found for: SPEP, UPEP  Lab Results  Component Value Date   WBC 7.1 04/05/2018   NEUTROABS 4.4 04/05/2018   HGB 11.7 (L) 04/05/2018   HCT 35.6 (L) 04/05/2018   MCV 88.1 04/05/2018   PLT 149 (L) 04/05/2018      Chemistry      Component Value Date/Time   NA 137 04/05/2018 1245   NA 140 02/11/2012 0415   K 3.9 04/05/2018 1245   K 3.9 02/11/2012 0415   CL 104 04/05/2018 1245    CL 108 (  H) 02/11/2012 0415   CO2 25 04/05/2018 1245   CO2 25 02/11/2012 0415   BUN 25 (H) 04/05/2018 1245   BUN 12 02/11/2012 0415   CREATININE 1.02 04/05/2018 1245   CREATININE 1.00 03/23/2014 1400   GLU 114 06/30/2016      Component Value Date/Time   CALCIUM 8.7 (L) 04/05/2018 1245   CALCIUM 8.3 (L) 03/23/2014 1400   ALKPHOS 49 04/05/2018 1245   ALKPHOS 69 03/07/2014 1414   AST 19 04/05/2018 1245   AST 16 03/07/2014 1414   ALT 11 04/05/2018 1245   ALT 19 03/07/2014 1414   BILITOT 0.4 04/05/2018 1245   BILITOT 0.3 03/07/2014 1414       RADIOGRAPHIC STUDIES: I have personally reviewed the radiological images as listed and agreed with the findings in the report. No results found.   ASSESSMENT & PLAN:  Prostate cancer ([place]) # CASTRATE RESISTANT PROSTATE CANCER- Metastatic disease with left bladder invasion/obstruction.   NOV 2019 PSA 0.50;   #Continue to hold Zytiga/lupron- sec to fatigue/pt pref. [See below] also hold Lupron for worsening fatigue.  Await PSA from today.  #Fatigue likely secondary to antiandrogen therapy-hold off Lupron/Zytiga for now.  # Severe osteoporosis: Currently on Prolia [last July 2019]; stable  # Left ureteral stent placement/hydronephrosis: Status post exchange.  Stable  # Iron deficiency anemia: Status post IV iron stable.  # palliative care discussion-today/in general goals of care.  #DISPOSITION:  # HOLD Lupron today. # Follow-up- in 6 weeks MD labs-CBC CMP PSA; possible lupron-Dr.B; add prolia.     No orders of the defined types were placed in this encounter.  All questions were answered. The patient knows to call the clinic with any problems, questions or concerns.      Cammie Sickle, MD 04/05/2018 4:43 PM

## 2018-04-05 NOTE — Progress Notes (Signed)
[place]  Telephone:([phone] [phone]   Name: Benjamin Villarreal Date: 04/05/2018 MRN: [id number]  DOB: Apr 09, 1920  Patient Care Team: Venia Carbon, MD as PCP - General    REASON FOR CONSULTATION: Palliative Care consult requested for this 85 y.o. male with multiple medical problems including had a static castrate resistant prostate cancer most recently treated with Zytiga and Lupron but treatment was held due to progressive fatigue.  Patient was referred to palliative care to help address goals.   SOCIAL HISTORY:    Patient is a widower. He lives at [facility] He is a retired Research scientist (physical sciences). He has three adult children, all of whom live in [place] and are involved in his care.   ADVANCE DIRECTIVES:  Not on file  CODE STATUS: DNR  PAST MEDICAL HISTORY: Past Medical History:  Diagnosis Date  . Aortic atherosclerosis ([place])    Noted on CT  . Arthritis   . Colon polyps    adenomatous  . History of fracture of tibia   . History of tachycardia    patient unaware of this  . Kyphosis of cervicothoracic region    SEVERE  . Osteoporosis 01/2014   T -3.8 (01/2014)  . Other constipation    chronic  . Prostate cancer [facility]) 1996   surgery then lupron  . Skin cancer, basal cell 2004   surgical resection.  Marland Kitchen Unspecified venous (peripheral) insufficiency   . Vitamin B12 deficiency     PAST SURGICAL HISTORY:  Past Surgical History:  Procedure Laterality Date  . BASAL CELL CARCINOMA EXCISION  2004   Left side of face  . CATARACT EXTRACTION Bilateral 2000, 2003   both eyes  . COLONOSCOPY     h/o adenomatous polyps  . CYSTOSCOPY W/ RETROGRADES Bilateral 11/19/2016   Procedure: CYSTOSCOPY WITH RETROGRADE PYELOGRAM;  Surgeon: Hollice Espy, MD;  Location: [facility] ORS;  Service: Urology;  Laterality: Bilateral;  General vs. Spinal  . CYSTOSCOPY W/ URETERAL STENT PLACEMENT Left 02/25/2017   Procedure: CYSTOSCOPY WITH STENT  REPLACEMENT;  Surgeon: Hollice Espy, MD;  Location: [facility] ORS;  Service: Urology;  Laterality: Left;  . CYSTOSCOPY WITH URETEROSCOPY AND STENT PLACEMENT Left 11/19/2016   Procedure: CYSTOSCOPY WITH URETEROSCOPY AND STENT PLACEMENT;  Surgeon: Hollice Espy, MD;  Location: [facility] ORS;  Service: Urology;  Laterality: Left;  . ESOPHAGOGASTRODUODENOSCOPY N/A 10/31/2017   Procedure: ESOPHAGOGASTRODUODENOSCOPY (EGD) with biopsy and brushings;  Surgeon: Toledo, Benay Pike, MD;  Location: [facility] ENDOSCOPY;  Service: Gastroenterology;  Laterality: N/A;  . FRACTURE SURGERY    . JOINT REPLACEMENT  2005   Left knee  . JOINT REPLACEMENT  12/13   Right knee  . PROSTATE SURGERY  1996   cancer  . sigmoid resection    . TIBIA FRACTURE SURGERY  1999  . URETERAL BIOPSY Left 11/19/2016   Procedure: URETERAL BIOPSY;  Surgeon: Hollice Espy, MD;  Location: [facility] ORS;  Service: Urology;  Laterality: Left;  Marland Kitchen VOLVULUS REDUCTION  12/10    HEMATOLOGY/ONCOLOGY HISTORY:  Oncology History    # 1996 PROSTATE CANCER [Gleason score 2+3] s/p Radical Prostatectomy [[facility]]; ?Biochem RECURRENT PROSTATE CANCER-Hormone sensitive;  Intermittent Lupron; on Lupron q 34M [Jan 2016 PSA- 7.9].  HOLD LUPRON in MAY 2017; AUg 2017- Re-start  # OCT 2018- Xtandi 3 pills a day; OCT 30th PET- Retroperitoneal/pelvic adenopathy/recurrence lateral bladder wall; NO Bone lesions; SEP 2019- STOP X-tandi sec difficulty swallowing  # sep 19th START Zytiga 1 pill/day  # SEP  2019- Dysphagia/pill esophagitis [s/p EGD Dr.Toledo]  # Sep 2019- Zytiga 250 mg/day   # Osteoporosis [[facility]- 2015]- on Reclast q 3M; FEB 2017- Start Prolia q 6 M -------------------------------------------------------------   DIAGNOSIS: Recurrent metastatic prostate cancer castrate resistant  STAGE:   4      ;GOALS: Palliative  CURRENT/MOST RECENT THERAPY : Zytiga [ordered sep19th]      Prostate cancer ([place])    ALLERGIES:  is allergic to other.  MEDICATIONS:    Current Outpatient Medications  Medication Sig Dispense Refill  . abiraterone acetate (ZYTIGA) 250 MG tablet TAKE 1 TABLET (250 MG TOTAL) BY MOUTH DAILY. TAKE WITH A LOW FAT MEAL. 30 tablet 2  . acetaminophen (TYLENOL) 325 MG tablet Take 325-650 mg by mouth every 4 (four) hours as needed for fever (general discomfort).     Marland Kitchen aspirin EC 81 MG tablet Take 81 mg by mouth every other day.    . clindamycin (CLEOCIN) 150 MG capsule Take 4 capsules by mouth as needed. Prior to dental work    . cyanocobalamin (,VITAMIN B-12,) 1000 MCG/ML injection Inject 1,000 mcg into the muscle every 30 (thirty) days.    Marland Kitchen denosumab (PROLIA) 60 MG/ML SOSY injection Inject 60 mg into the skin every 6 (six) months.    . furosemide (LASIX) 40 MG tablet Take 1 tablet by mouth daily.    Marland Kitchen latanoprost (XALATAN) 0.005 % ophthalmic solution Place 1 drop into both eyes at bedtime.    . mupirocin ointment (BACTROBAN) 2 % Place 1 application into the nose daily.    . pantoprazole (PROTONIX) 40 MG tablet Take 1 tablet (40 mg total) by mouth daily. 60 tablet 0  . polyethylene glycol powder (GLYCOLAX/MIRALAX) powder FILL TO LINE (17 GRAMS ), MIX AND DRINK TWICE A DAY AND 8.5 GRAMS AT NOON 510 g 3  . sennosides-docusate sodium (SENOKOT-S) 8.6-50 MG tablet Take 4 tablets by mouth every evening.     . Skin Protectants, Misc. (EUCERIN) cream Apply 1 application topically 2 (two) times daily as needed for dry skin.    Marland Kitchen traMADol (ULTRAM) 50 MG tablet Take by mouth every 6 (six) hours as needed for moderate pain.    Marland Kitchen triamcinolone cream (KENALOG) 0.1 % Apply 1 application topically daily as needed.      No current facility-administered medications for this visit.    Facility-Administered Medications Ordered in Other Visits  Medication Dose Route Frequency Provider Last Rate Last Dose  . leuprolide (LUPRON) injection 30 mg  30 mg Intramuscular Once Leia Alf, MD        VITAL SIGNS: There were no vitals taken for this  visit. There were no vitals filed for this visit.  Estimated body mass index is 24.34 kg/m as calculated from the following:   Height as of an earlier encounter on 04/05/18: 6' (1.829 m).   Weight as of an earlier encounter on 04/05/18: 179 lb 8 oz (81.4 kg).  LABS: CBC:    Component Value Date/Time   WBC 7.1 04/05/2018 1245   HGB 11.7 (L) 04/05/2018 1245   HGB 11.3 (L) 03/07/2014 1414   HCT 35.6 (L) 04/05/2018 1245   HCT 34.2 (L) 03/07/2014 1414   PLT 149 (L) 04/05/2018 1245   PLT 143 (L) 03/07/2014 1414   MCV 88.1 04/05/2018 1245   MCV 84 03/07/2014 1414   NEUTROABS 4.4 04/05/2018 1245   NEUTROABS 2.8 03/07/2014 1414   LYMPHSABS 1.7 04/05/2018 1245   LYMPHSABS 1.8 03/07/2014 1414   MONOABS 0.6 04/05/2018  1245   MONOABS 0.5 03/07/2014 1414   EOSABS 0.3 04/05/2018 1245   EOSABS 0.3 03/07/2014 1414   BASOSABS 0.1 04/05/2018 1245   BASOSABS 0.2 (H) 03/07/2014 1414   Comprehensive Metabolic Panel:    Component Value Date/Time   NA 137 04/05/2018 1245   NA 140 02/11/2012 0415   K 3.9 04/05/2018 1245   K 3.9 02/11/2012 0415   CL 104 04/05/2018 1245   CL 108 (H) 02/11/2012 0415   CO2 25 04/05/2018 1245   CO2 25 02/11/2012 0415   BUN 25 (H) 04/05/2018 1245   BUN 12 02/11/2012 0415   CREATININE 1.02 04/05/2018 1245   CREATININE 1.00 03/23/2014 1400   GLUCOSE 190 (H) 04/05/2018 1245   GLUCOSE 125 (H) 02/11/2012 0415   CALCIUM 8.7 (L) 04/05/2018 1245   CALCIUM 8.3 (L) 03/23/2014 1400   AST 19 04/05/2018 1245   AST 16 03/07/2014 1414   ALT 11 04/05/2018 1245   ALT 19 03/07/2014 1414   ALKPHOS 49 04/05/2018 1245   ALKPHOS 69 03/07/2014 1414   BILITOT 0.4 04/05/2018 1245   BILITOT 0.3 03/07/2014 1414   PROT 7.0 04/05/2018 1245   PROT 7.0 03/07/2014 1414   ALBUMIN 4.1 04/05/2018 1245   ALBUMIN 3.7 03/07/2014 1414    RADIOGRAPHIC STUDIES: No results found.  PERFORMANCE STATUS (ECOG) : 2 - Symptomatic, <50% confined to bed  Review of Systems As noted above.  Otherwise, a complete review of systems is negative.  Physical Exam General: NAD, frail appearing, thin Cardiovascular: regular rate and rhythm Pulmonary: clear ant fields  Abdomen: soft, nontender, + bowel sounds Extremities: no edema, compression hose bilaterally Skin: no rashes Neurological: Weakness but otherwise nonfocal  IMPRESSION: I met with patient and his daughter today in the clinic.  Introduced palliative care services and attempted to establish therapeutic rapport.  Patient says that he feels he is doing better since stopping the Zytiga.  He feels his weakness has improved as has his appetite.  At baseline, patient is ambulatory with use of a walker.  He lives in assisted living facility at [facility].  Patient denies recent falls.  He is able to assist with activities of daily living.  Patient says his oral intake has greatly improved.  He is eating 50% of meals.  Weight is stable 179 pounds. Daughter is interested in trying mushroom supplements to see if they help with gastric protection.   Symptomatically, patient denies any acute or distressing symptoms.  We discussed advance care planning.  Patient says he has a healthcare power of attorney and living will documents but does not recall who he appointed to be his [place].  We do not have ACP documents on file but daughter will bring [place] a copy.  Patient says he does not want to be resuscitated nor have his life prolonged artificially on machines.  He says he has a DNR order on file at [facility].  PLAN: Supportive care DNR RTC in 1 month   Patient expressed understanding and was in agreement with this plan. He also understands that He can call clinic at any time with any questions, concerns, or complaints.    Time Total: 30 minutes  Visit consisted of counseling and education dealing with the complex and emotionally intense issues of symptom management and palliative care in the setting of serious and potentially  life-threatening illness.Greater than 50%  of this time was spent counseling and coordinating care related to the above assessment and plan.  Signed  by: Altha Harm, PhD, NP-C [phone] (Work Cell)

## 2018-04-06 NOTE — Telephone Encounter (Signed)
I add this to the patient's schedule

## 2018-04-28 ENCOUNTER — Ambulatory Visit: Payer: Medicare Other | Admitting: Internal Medicine

## 2018-04-28 VITALS — BP 126/62 | HR 72 | Temp 98.6°F | Resp 17 | Wt 179.6 lb

## 2018-04-28 DIAGNOSIS — K222 Esophageal obstruction: Secondary | ICD-10-CM

## 2018-04-28 DIAGNOSIS — G463 Brain stem stroke syndrome: Secondary | ICD-10-CM | POA: Diagnosis not present

## 2018-04-28 DIAGNOSIS — M818 Other osteoporosis without current pathological fracture: Secondary | ICD-10-CM

## 2018-04-28 DIAGNOSIS — N181 Chronic kidney disease, stage 1: Secondary | ICD-10-CM

## 2018-04-28 DIAGNOSIS — I872 Venous insufficiency (chronic) (peripheral): Secondary | ICD-10-CM | POA: Diagnosis not present

## 2018-04-28 DIAGNOSIS — D631 Anemia in chronic kidney disease: Secondary | ICD-10-CM

## 2018-04-28 DIAGNOSIS — C61 Malignant neoplasm of prostate: Secondary | ICD-10-CM

## 2018-05-01 ENCOUNTER — Emergency Department: Payer: Medicare Other

## 2018-05-01 ENCOUNTER — Emergency Department
Admission: EM | Admit: 2018-05-01 | Discharge: 2018-05-01 | Disposition: A | Discharge status: Home / Self Care | Payer: Medicare Other | Attending: Emergency Medicine | Admitting: Emergency Medicine

## 2018-05-01 DIAGNOSIS — Z85828 Personal history of other malignant neoplasm of skin: Secondary | ICD-10-CM | POA: Insufficient documentation

## 2018-05-01 DIAGNOSIS — R070 Pain in throat: Secondary | ICD-10-CM | POA: Diagnosis not present

## 2018-05-01 DIAGNOSIS — Z8546 Personal history of malignant neoplasm of prostate: Secondary | ICD-10-CM | POA: Insufficient documentation

## 2018-05-01 DIAGNOSIS — Z7982 Long term (current) use of aspirin: Secondary | ICD-10-CM | POA: Diagnosis not present

## 2018-05-01 DIAGNOSIS — Z79899 Other long term (current) drug therapy: Secondary | ICD-10-CM | POA: Insufficient documentation

## 2018-05-01 DIAGNOSIS — R079 Chest pain, unspecified: Secondary | ICD-10-CM | POA: Diagnosis not present

## 2018-05-01 DIAGNOSIS — Z96653 Presence of artificial knee joint, bilateral: Secondary | ICD-10-CM | POA: Diagnosis not present

## 2018-05-01 DIAGNOSIS — R5381 Other malaise: Secondary | ICD-10-CM | POA: Diagnosis not present

## 2018-05-01 LAB — BASIC METABOLIC PANEL
Anion gap: 11 (ref 5–15)
BUN: 22 mg/dL (ref 8–23)
CHLORIDE: 103 mmol/L (ref 98–111)
CO2: 24 mmol/L (ref 22–32)
Calcium: 9.4 mg/dL (ref 8.9–10.3)
Creatinine, Ser: 0.93 mg/dL (ref 0.61–1.24)
GFR calc Af Amer: >60 mL/min (ref >60)
GFR calc non Af Amer: >60 mL/min (ref >60)
Glucose, Bld: 132 mg/dL — ABNORMAL HIGH (ref 70–99)
Potassium: 3.8 mmol/L (ref 3.5–5.1)
SODIUM: 138 mmol/L (ref 135–145)

## 2018-05-01 LAB — CBC WITH DIFFERENTIAL/PLATELET
Abs Immature Granulocytes: 0.02 10*3/uL (ref 0.00–0.07)
Basophils Absolute: 0.1 10*3/uL (ref 0.0–0.1)
Basophils Relative: 2 %
Eosinophils Absolute: 0.4 10*3/uL (ref 0.0–0.5)
Eosinophils Relative: 6 %
HCT: 38.3 % — ABNORMAL LOW (ref 39.0–52.0)
Hemoglobin: 12.6 g/dL — ABNORMAL LOW (ref 13.0–17.0)
Immature Granulocytes: 0 %
Lymphocytes Relative: 27 %
Lymphs Abs: 1.8 10*3/uL (ref 0.7–4.0)
MCH: 29.1 pg (ref 26.0–34.0)
MCHC: 32.9 g/dL (ref 30.0–36.0)
MCV: 88.5 fL (ref 80.0–100.0)
Monocytes Absolute: 0.6 10*3/uL (ref 0.1–1.0)
Monocytes Relative: 9 %
Neutro Abs: 3.6 10*3/uL (ref 1.7–7.7)
Neutrophils Relative %: 56 %
Platelets: 152 10*3/uL (ref 150–400)
RBC: 4.33 MIL/uL (ref 4.22–5.81)
RDW: 13.8 % (ref 11.5–15.5)
WBC: 6.5 10*3/uL (ref 4.0–10.5)
nRBC: 0 % (ref 0.0–0.2)

## 2018-05-01 LAB — HEPATIC FUNCTION PANEL
ALT: 13 U/L (ref 0–44)
AST: 23 U/L (ref 15–41)
Albumin: 4.4 g/dL (ref 3.5–5.0)
Alkaline Phosphatase: 52 U/L (ref 38–126)
Bilirubin, Direct: 0.1 mg/dL (ref 0.0–0.2)
Indirect Bilirubin: 0.8 mg/dL (ref 0.3–0.9)
Total Bilirubin: 0.9 mg/dL (ref 0.3–1.2)
Total Protein: 8 g/dL (ref 6.5–8.1)

## 2018-05-01 LAB — LIPASE, BLOOD: LIPASE: 23 U/L (ref 11–51)

## 2018-05-01 MED ORDER — DIPHENHYDRAMINE HCL 50 MG/ML IJ SOLN
25.0000 mg | Freq: Once | INTRAMUSCULAR | Status: AC
  Administered 2018-05-01: 25 mg via INTRAVENOUS
  Filled 2018-05-01: qty 1

## 2018-05-01 NOTE — ED Notes (Signed)
Attempted to call report to [facility]. Was placed on hold for 10 minutes. Upon 2nd attempt, was transferred to a central answering service who informed this nurse that they did not have anyone to transport him on the weekend.

## 2018-05-01 NOTE — ED Triage Notes (Signed)
Pt presents via EMS from [facility] c/o excessive saliva production and feeling like something is stuck in throat. Airway intact at this time.

## 2018-05-01 NOTE — Discharge Instructions (Addendum)
Your x-ray and lab work-up are normal.  It is possible that you had a pill or piece of food stuck in your esophagus that has since passed, or you may have had a mild allergic reaction.  Continue to take your regular medications as prescribed and eat and drink normally.  Return to the ER immediately for new or worsening discomfort in your throat, difficulty swallowing, shortness of breath, excessive coughing, vomiting, or any other new or worsening symptoms that concern you.

## 2018-05-01 NOTE — ED Notes (Signed)
Per [employer], Network engineer. [facility] coming to transport pt back to facility.

## 2018-05-01 NOTE — ED Provider Notes (Signed)
[facility] Emergency Department Provider Note ____________________________________________   First MD Initiated Contact with Patient 05/01/18 0932     (approximate)  I have reviewed the triage vital signs and the nursing notes.   HISTORY  Chief Complaint Sore Throat    HPI Benjamin Villarreal is a 85 y.o. male with PMH as noted below who presents with throat discomfort, acute onset this morning while the patient was eating breakfast, and described as a feeling like something was stuck in his throat or upper chest.  He states he began salivating a great deal and had to spit up a bit of food.  He states that it has now improved.  He denies any rash or itching.  He has no shortness of breath, vomiting, or fever.  He states that prior to breakfast he took all of his morning pills some which are large.  Past Medical History:  Diagnosis Date  . Aortic atherosclerosis ([place])    Noted on CT  . Arthritis   . Colon polyps    adenomatous  . History of fracture of tibia   . History of tachycardia    patient unaware of this  . Kyphosis of cervicothoracic region    SEVERE  . Osteoporosis 01/2014   T -3.8 (01/2014)  . Other constipation    chronic  . Prostate cancer [facility]) 1996   surgery then lupron  . Skin cancer, basal cell 2004   surgical resection.  Marland Kitchen Unspecified venous (peripheral) insufficiency   . Vitamin B12 deficiency     Patient Active Problem List   Diagnosis Date Noted  . Esophageal stricture 10/31/2017  . Primary prostate cancer with metastasis from prostate to other site [facility]) 07/23/2017  . Stasis dermatitis 07/23/2017  . Lymphedema 06/16/2017  . Aortic atherosclerosis ([place]) 10/09/2016  . Peripheral neuropathy 01/24/2016  . 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 Allergic rhinitis due to pollen 10/21/2012  . Prostate cancer ([place]) 03/22/2009  . Chronic venous insufficiency 09/07/2008  . CONSTIPATION, CHRONIC 09/07/2008  . Osteoarthritis,  multiple sites 09/05/2008  . Osteoporosis 09/05/2008    Past Surgical History:  Procedure Laterality Date  . BASAL CELL CARCINOMA EXCISION  2004   Left side of face  . CATARACT EXTRACTION Bilateral 2000, 2003   both eyes  . COLONOSCOPY     h/o adenomatous polyps  . CYSTOSCOPY W/ RETROGRADES Bilateral 11/19/2016   Procedure: CYSTOSCOPY WITH RETROGRADE PYELOGRAM;  Surgeon: Hollice Espy, MD;  Location: [facility] ORS;  Service: Urology;  Laterality: Bilateral;  General vs. Spinal  . CYSTOSCOPY W/ URETERAL STENT PLACEMENT Left 02/25/2017   Procedure: CYSTOSCOPY WITH STENT REPLACEMENT;  Surgeon: Hollice Espy, MD;  Location: [facility] ORS;  Service: Urology;  Laterality: Left;  . CYSTOSCOPY WITH URETEROSCOPY AND STENT PLACEMENT Left 11/19/2016   Procedure: CYSTOSCOPY WITH URETEROSCOPY AND STENT PLACEMENT;  Surgeon: Hollice Espy, MD;  Location: [facility] ORS;  Service: Urology;  Laterality: Left;  . ESOPHAGOGASTRODUODENOSCOPY N/A 10/31/2017   Procedure: ESOPHAGOGASTRODUODENOSCOPY (EGD) with biopsy and brushings;  Surgeon: Toledo, Benay Pike, MD;  Location: [facility] ENDOSCOPY;  Service: Gastroenterology;  Laterality: N/A;  . FRACTURE SURGERY    . JOINT REPLACEMENT  2005   Left knee  . JOINT REPLACEMENT  12/13   Right knee  . PROSTATE SURGERY  1996   cancer  . sigmoid resection    . TIBIA FRACTURE SURGERY  1999  . URETERAL BIOPSY Left 11/19/2016   Procedure: URETERAL BIOPSY;  Surgeon: Hollice Espy, MD;  Location: [facility] ORS;  Service: Urology;  Laterality: Left;  Marland Kitchen VOLVULUS REDUCTION  12/10    Prior to Admission medications   Medication Sig Start Date End Date Taking? Authorizing Provider  abiraterone acetate (ZYTIGA) 250 MG tablet TAKE 1 TABLET (250 MG TOTAL) BY MOUTH DAILY. TAKE WITH A LOW FAT MEAL. 02/05/18  Yes Cammie Sickle, MD  aspirin EC 81 MG tablet Take 81 mg by mouth every other day.   Yes [provider]  cyanocobalamin (,VITAMIN B-12,) 1000 MCG/ML injection Inject 1,000 mcg  into the muscle every 30 (thirty) days.   Yes [provider]  denosumab (PROLIA) 60 MG/ML SOSY injection Inject 60 mg into the skin every 6 (six) months.   Yes [provider]  furosemide (LASIX) 40 MG tablet Take 1 tablet by mouth daily.   Yes [provider]  latanoprost (XALATAN) 0.005 % ophthalmic solution Place 1 drop into both eyes at bedtime.   Yes [provider]  mupirocin ointment (BACTROBAN) 2 % Place 1 application into the nose daily.   Yes [provider]  OVER THE COUNTER MEDICATION Take 3 capsules by mouth daily.   Yes [provider]  OVER THE COUNTER MEDICATION Take 2 capsules by mouth every morning.   Yes [provider]  pantoprazole (PROTONIX) 40 MG tablet Take 1 tablet (40 mg total) by mouth daily. 01/14/18  Yes Viviana Simpler I, MD  polyethylene glycol powder (GLYCOLAX/MIRALAX) powder FILL TO LINE (17 GRAMS ), MIX AND DRINK TWICE A DAY AND 8.5 GRAMS AT NOON 09/30/16  Yes Venia Carbon, MD  sennosides-docusate sodium (SENOKOT-S) 8.6-50 MG tablet Take 2 tablets by mouth every evening.    Yes [provider]  Skin Protectants, Misc. (EUCERIN) cream Apply 1 application topically 2 (two) times daily as needed for dry skin.   Yes [provider]  acetaminophen (TYLENOL) 325 MG tablet Take 325-650 mg by mouth every 4 (four) hours as needed for fever (general discomfort).     [provider]  clindamycin (CLEOCIN) 150 MG capsule Take 4 capsules by mouth as needed. Prior to dental work 12/03/17   [provider]    Allergies Other  Family History  Problem Relation Age of Onset  . Leukemia Mother   . Diabetes Son   . Prostate cancer Neg Hx   . Bladder Cancer Neg Hx   . Kidney cancer Neg Hx     Social History Social History   Tobacco Use  . Smoking status: Never Smoker  . Smokeless tobacco: Never Used  Substance Use Topics  . Alcohol use: No    Alcohol/week: 0.0 - 1.0  standard drinks    Frequency: Never    Comment: in past  . Drug use: No    Review of Systems  Constitutional: No fever. Eyes: No redness. ENT: Positive for throat discomfort. Cardiovascular: Denies chest pain. Respiratory: Denies shortness of breath. Gastrointestinal: No vomiting.  Genitourinary: Negative for flank pain.  Musculoskeletal: Negative for back pain. Skin: Negative for rash. Neurological: Negative for headache.   ____________________________________________   PHYSICAL EXAM:  VITAL SIGNS: ED Triage Vitals [05/01/18 0929]  Enc Vitals Group     BP 138/72     Pulse Rate 61     Resp 14     Temp 98 F (36.7 C)     Temp Source Oral     SpO2 98 %     Weight 170 lb (77.1 kg)     Height  Head Circumference      Peak Flow      Pain Score 0     Pain Loc      Pain Edu?      Excl. in [place]?     Constitutional: Alert and oriented.  Very well appearing for age and in no acute distress. Eyes: Conjunctivae are normal.  Head: Atraumatic. Nose: No congestion/rhinnorhea. Mouth/Throat: Mucous membranes are moist.  Oropharynx clear with no erythema or exudate.  No pooled secretions.  No stridor. Neck: Normal range of motion.  No swelling. Cardiovascular: Normal rate, regular rhythm. Grossly normal heart sounds.  Good peripheral circulation. Respiratory: Normal respiratory effort.  No retractions. Lungs CTAB. Gastrointestinal: No distention.  Musculoskeletal:  Extremities warm and well perfused.  Neurologic:  Normal speech and language. No gross focal neurologic deficits are appreciated.  Skin:  Skin is warm and dry. No rash noted. Psychiatric: Mood and affect are normal. Speech and behavior are normal.  ____________________________________________   LABS (all labs ordered are listed, but only abnormal results are displayed)  Labs Reviewed  BASIC METABOLIC PANEL - Abnormal; Notable for the following components:      Result Value   Glucose, Bld 132 (*)    All  other components within normal limits  CBC WITH DIFFERENTIAL/PLATELET - Abnormal; Notable for the following components:   Hemoglobin 12.6 (*)    HCT 38.3 (*)    All other components within normal limits  HEPATIC FUNCTION PANEL  LIPASE, BLOOD   ____________________________________________  EKG   ____________________________________________  RADIOLOGY  CXR: No acute abnormalities  ____________________________________________   PROCEDURES  Procedure(s) performed: No  Procedures  Critical Care performed: No ____________________________________________   INITIAL IMPRESSION / ASSESSMENT AND PLAN / ED COURSE  Pertinent labs & imaging results that were available during my care of the patient were reviewed by me and considered in my medical decision making (see chart for details).  85 year old male with PMH as noted above presents from his facility with throat discomfort and increased salivation after eating breakfast today.  The patient states that he may have spit up a small amount of food but had no vomiting or significant regurgitation, and is able to swallow now.  He has no shortness of breath or chest pain.  On exam the patient is very well-appearing and his vital signs are normal.  The oropharynx is clear with no pooled secretions.  He has no stridor, and the lungs are clear.  I suspect that the patient either had a brief and now resolved food or pill impaction, but differential also includes mild allergic reaction.  Given the excessive salivation and hiccups that the patient reports at the beginning of this episode, pancreatitis is also a consideration although much less likely.  We will obtain labs, chest x-ray, give a dose of Benadryl, and reassess.  The work-up is negative, I will plan for p.o. trial.  ----------------------------------------- 11:07 AM on 05/01/2018 -----------------------------------------  X-ray and lab work-up is negative.  The patient has had no  symptoms in the ED.  He is tolerating p.o. liquids at this time without any difficulty.  He is stable for discharge home.  I discussed the results of the work-up with him.  Return precautions given, and he expressed understanding. ____________________________________________   FINAL CLINICAL IMPRESSION(S) / ED DIAGNOSES  Final diagnoses:  Throat discomfort      NEW MEDICATIONS STARTED DURING THIS VISIT:  New Prescriptions   No medications on file     Note:  This  document was prepared using Systems analyst and may include unintentional dictation errors.    Arta Silence, MD 05/01/18 [phone]

## 2018-05-07 ENCOUNTER — Encounter: Payer: Self-pay | Admitting: Internal Medicine

## 2018-05-07 NOTE — Assessment & Plan Note (Signed)
Appreciate ALF care Continue Aspirin

## 2018-05-07 NOTE — Assessment & Plan Note (Signed)
Continue Zytiga unless deteriorates

## 2018-05-07 NOTE — Patient Instructions (Signed)
Anemia  Anemia is a condition in which you do not have enough red blood cells or hemoglobin. Hemoglobin is a substance in red blood cells that carries oxygen. When you do not have enough red blood cells or hemoglobin (are anemic), your body cannot get enough oxygen and your organs may not work properly. As a result, you may feel very tired or have other problems. What are the causes? Common causes of anemia include:  Excessive bleeding. Anemia can be caused by excessive bleeding inside or outside the body, including bleeding from the intestine or from periods in women.  Poor nutrition.  Long-lasting (chronic) kidney, thyroid, and liver disease.  Bone marrow disorders.  Cancer and treatments for cancer.  HIV (human immunodeficiency virus) and AIDS (acquired immunodeficiency syndrome).  Treatments for HIV and AIDS.  Spleen problems.  Blood disorders.  Infections, medicines, and autoimmune disorders that destroy red blood cells. What are the signs or symptoms? Symptoms of this condition include:  Minor weakness.  Dizziness.  Headache.  Feeling heartbeats that are irregular or faster than normal (palpitations).  Shortness of breath, especially with exercise.  Paleness.  Cold sensitivity.  Indigestion.  Nausea.  Difficulty sleeping.  Difficulty concentrating. Symptoms may occur suddenly or develop slowly. If your anemia is mild, you may not have symptoms. How is this diagnosed? This condition is diagnosed based on:  Blood tests.  Your medical history.  A physical exam.  Bone marrow biopsy. Your health care provider may also check your stool (feces) for blood and may do additional testing to look for the cause of your bleeding. You may also have other tests, including:  Imaging tests, such as a CT scan or MRI.  Endoscopy.  Colonoscopy. How is this treated? Treatment for this condition depends on the cause. If you continue to lose a lot of blood, you may  need to be treated at a hospital. Treatment may include:  Taking supplements of iron, vitamin S31, or folic acid.  Taking a hormone medicine (erythropoietin) that can help to stimulate red blood cell growth.  Having a blood transfusion. This may be needed if you lose a lot of blood.  Making changes to your diet.  Having surgery to remove your spleen. Follow these instructions at home:  Take over-the-counter and prescription medicines only as told by your health care provider.  Take supplements only as told by your health care provider.  Follow any diet instructions that you were given.  Keep all follow-up visits as told by your health care provider. This is important. Contact a health care provider if:  You develop new bleeding anywhere in the body. Get help right away if:  You are very weak.  You are short of breath.  You have pain in your abdomen or chest.  You are dizzy or feel faint.  You have trouble concentrating.  You have bloody or black, tarry stools.  You vomit repeatedly or you vomit up blood. Summary  Anemia is a condition in which you do not have enough red blood cells or enough of a substance in your red blood cells that carries oxygen (hemoglobin).  Symptoms may occur suddenly or develop slowly.  If your anemia is mild, you may not have symptoms.  This condition is diagnosed with blood tests as well as a medical history and physical exam. Other tests may be needed.  Treatment for this condition depends on the cause of the anemia. This information is not intended to replace advice given to you by  your health care provider. Make sure you discuss any questions you have with your health care provider. Document Released: 03/20/2004 Document Revised: 03/14/2016 Document Reviewed: 03/14/2016 Elsevier Interactive Patient Education  2019 [employer].

## 2018-05-07 NOTE — Progress Notes (Signed)
Subjective:    Patient ID: Benjamin Villarreal, male    DOB: April 05, 1920, 85 y.o.   MRN: [id number]  HPI  Resident in [place]. 215 routine follow-up Reviewed RN, no new concerns Resident reports he has intermittent left-sided spine that only occurs with laying down at night.  He reports chest pain resolves with position changes. Otherwise, he is doing well.  He sleeps well. Independent with ADLs.  Walks with rolling walker. Appetite good, weight stable.  He has some urinary leakage.  Bowels okay. He denies complaint pain, reflux or shortness of breath at this time  Prostate Cancer: s/p surgery, Lupron. Taking Zytiga as prescribed.  Hx of Stroke: Mostly cognitive.  He is taking Aspirin as prescribed.  Anemia of CKD: His last H&H was 11.7/35.6.  He is not taking an iron supplement at this time.  Osteoporosis: On Prolia.  Bone density 01/2014 reviewed.  Peripheral Edema: R>L.  Improved with Lasix and TED hose.  GERD: History of esophageal stricture.  He denies swallowing issues.  He denies breakthrough on Pantoprazole.  Upper GI from 10/2017 reviewed.  Review of Systems      Past Medical History:  Diagnosis Date  . Aortic atherosclerosis ([place])    Noted on CT  . Arthritis   . Colon polyps    adenomatous  . History of fracture of tibia   . History of tachycardia    patient unaware of this  . Kyphosis of cervicothoracic region    SEVERE  . Osteoporosis 01/2014   T -3.8 (01/2014)  . Other constipation    chronic  . Prostate cancer [facility]) 1996   surgery then lupron  . Skin cancer, basal cell 2004   surgical resection.  Marland Kitchen Unspecified venous (peripheral) insufficiency   . Vitamin B12 deficiency     Current Outpatient Medications  Medication Sig Dispense Refill  . abiraterone acetate (ZYTIGA) 250 MG tablet TAKE 1 TABLET (250 MG TOTAL) BY MOUTH DAILY. TAKE WITH A LOW FAT MEAL. 30 tablet 2  . aspirin EC 81 MG tablet Take 81 mg by mouth every other day.    . cyanocobalamin (,VITAMIN  B-12,) 1000 MCG/ML injection Inject 1,000 mcg into the muscle every 30 (thirty) days.    Marland Kitchen denosumab (PROLIA) 60 MG/ML SOSY injection Inject 60 mg into the skin every 6 (six) months.    . furosemide (LASIX) 40 MG tablet Take 1 tablet by mouth daily.    Marland Kitchen latanoprost (XALATAN) 0.005 % ophthalmic solution Place 1 drop into both eyes at bedtime.    . pantoprazole (PROTONIX) 40 MG tablet Take 1 tablet (40 mg total) by mouth daily. 60 tablet 0  . polyethylene glycol powder (GLYCOLAX/MIRALAX) powder FILL TO LINE (17 GRAMS ), MIX AND DRINK TWICE A DAY AND 8.5 GRAMS AT NOON 510 g 3  . sennosides-docusate sodium (SENOKOT-S) 8.6-50 MG tablet Take 2 tablets by mouth every evening.     . Skin Protectants, Misc. (EUCERIN) cream Apply 1 application topically 2 (two) times daily as needed for dry skin.    Marland Kitchen acetaminophen (TYLENOL) 325 MG tablet Take 325-650 mg by mouth every 4 (four) hours as needed for fever (general discomfort).     . clindamycin (CLEOCIN) 150 MG capsule Take 4 capsules by mouth as needed. Prior to dental work    . mupirocin ointment (BACTROBAN) 2 % Place 1 application into the nose daily.    Marland Kitchen OVER THE COUNTER MEDICATION Take 3 capsules by mouth daily.    Marland Kitchen OVER THE  COUNTER MEDICATION Take 2 capsules by mouth every morning.     No current facility-administered medications for this visit.    Facility-Administered Medications Ordered in Other Visits  Medication Dose Route Frequency Provider Last Rate Last Dose  . leuprolide (LUPRON) injection 30 mg  30 mg Intramuscular Once Leia Alf, MD        Allergies  Allergen Reactions  . Other     RAW ONIONS- SINUS REACTION    Family History  Problem Relation Age of Onset  . Leukemia Mother   . Diabetes Son   . Prostate cancer Neg Hx   . Bladder Cancer Neg Hx   . Kidney cancer Neg Hx     Social History   Socioeconomic History  . Marital status: Widowed    Spouse name: [name]  . Number of children: 3  . Years of education: Not  on file  . Highest education level: Not on file  Occupational History  . 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 Financial resource strain: Not on file  . Food insecurity:    Worry: Not on file    Inability: Not on file  . Transportation needs:    Medical: Not on file    Non-medical: Not on file  Tobacco Use  . Smoking status: Never Smoker  . Smokeless tobacco: Never Used  Substance and Sexual Activity  . Alcohol use: No    Alcohol/week: 0.0 - 1.0 standard drinks    Frequency: Never    Comment: in past  . Drug use: No  . Sexual activity: Not Currently  Lifestyle  . Physical activity:    Days per week: Not on file    Minutes per session: Not on file  . Stress: Not on file  Relationships  . Social connections:    Talks on phone: Not on file    Gets together: Not on file    Attends religious service: Not on file    Active member of club or organization: Not on file    Attends meetings of clubs or organizations: Not on file    Relationship status: Not on file  . Intimate partner violence:    Fear of current or ex partner: Not on file    Emotionally abused: Not on file    Physically abused: Not on file    Forced sexual activity: Not on file  Other Topics Concern  . Not on file  Social History Narrative   Has living will   Daughter [name] holds health care POA.   Has DNR and we have reviewed this.   Would not want tube feedings if cognitively unaware     Constitutional: Denies fever, malaise, fatigue, headache or abrupt weight changes.  HEENT: Denies eye pain, eye redness, ear pain, ringing in the ears, wax buildup, runny nose, nasal congestion, bloody nose, or sore throat. Respiratory: Denies difficulty breathing, shortness of breath, cough or sputum production.   Cardiovascular: Pt reports swelling in legs. Denies chest pain, chest tightness, palpitations or swelling in the hands.  Gastrointestinal: Denies abdominal pain, bloating,  constipation, diarrhea or blood in the stool.  GU: Pt reports urinary incontinence. Denies urgency, frequency, pain with urination, burning sensation, blood in urine, odor or discharge. Musculoskeletal: Pt reports left sided chest wall pain. Denies decrease in range of motion, difficulty with gait, or joint pain and swelling.  Skin: Denies redness, rashes, lesions or ulcercations.  Neurological: Pt reports problems with balance at times.  Denies dizziness, difficulty with memory, difficulty with speech or problems with balance and coordination.  Psych: Denies anxiety, depression, SI/HI.  No other specific complaints in a complete review of systems (except as listed in HPI above).  Objective:   Physical Exam   BP 126/62   Pulse 72   Temp 98.6 F (37 C)   Resp 17   Wt 179 lb 9.6 oz (81.5 kg)   BMI 24.36 kg/m  Wt Readings from Last 3 Encounters:  05/01/18 170 lb (77.1 kg)  05/07/18 179 lb 9.6 oz (81.5 kg)  04/05/18 179 lb 8 oz (81.4 kg)    General: Appears his stated age, well developed, well nourished in NAD. Cardiovascular: Normal rate and rhythm. S1,S2 noted.  No murmur, rubs or gallops noted. !+ R>L LLE edema. Pulmonary/Chest: Normal effort and positive vesicular breath sounds. No respiratory distress. No wheezes, rales or ronchi noted.  Abdomen: Soft and nontender. Normal bowel sounds.  Musculoskeletal: Gait slow and steady with use of rolling walker.  Neurological: Alert and oriented.  Psychiatric: Mood and affect normal.   BMET    Component Value Date/Time   NA 138 05/01/2018 0934   NA 140 02/11/2012 0415   K 3.8 05/01/2018 0934   K 3.9 02/11/2012 0415   CL 103 05/01/2018 0934   CL 108 (H) 02/11/2012 0415   CO2 24 05/01/2018 0934   CO2 25 02/11/2012 0415   GLUCOSE 132 (H) 05/01/2018 0934   GLUCOSE 125 (H) 02/11/2012 0415   BUN 22 05/01/2018 0934   BUN 12 02/11/2012 0415   CREATININE 0.93 05/01/2018 0934   CREATININE 1.00 03/23/2014 1400   CALCIUM 9.4 05/01/2018  0934   CALCIUM 8.3 (L) 03/23/2014 1400   GFRNONAA >60 05/01/2018 0934   GFRNONAA >60 03/23/2014 1400   GFRNONAA 49 (L) 09/02/2013 1428   GFRAA >60 05/01/2018 0934   GFRAA >60 03/23/2014 1400   GFRAA 57 (L) 09/02/2013 1428    Lipid Panel  No results found for: CHOL, TRIG, HDL, CHOLHDL, VLDL, LDLCALC  CBC    Component Value Date/Time   WBC 6.5 05/01/2018 0934   RBC 4.33 05/01/2018 0934   HGB 12.6 (L) 05/01/2018 0934   HGB 11.3 (L) 03/07/2014 1414   HCT 38.3 (L) 05/01/2018 0934   HCT 34.2 (L) 03/07/2014 1414   PLT 152 05/01/2018 0934   PLT 143 (L) 03/07/2014 1414   MCV 88.5 05/01/2018 0934   MCV 84 03/07/2014 1414   MCH 29.1 05/01/2018 0934   MCHC 32.9 05/01/2018 0934   RDW 13.8 05/01/2018 0934   RDW 15.2 (H) 03/07/2014 1414   LYMPHSABS 1.8 05/01/2018 0934   LYMPHSABS 1.8 03/07/2014 1414   MONOABS 0.6 05/01/2018 0934   MONOABS 0.5 03/07/2014 1414   EOSABS 0.4 05/01/2018 0934   EOSABS 0.3 03/07/2014 1414   BASOSABS 0.1 05/01/2018 0934   BASOSABS 0.2 (H) 03/07/2014 1414    Hgb A1C No results found for: HGBA1C         Assessment & Plan:

## 2018-05-07 NOTE — Assessment & Plan Note (Signed)
Continue Lasix and TED hose Encouraged elevation

## 2018-05-07 NOTE — Assessment & Plan Note (Signed)
Continue Prolia We will monitor calcium yearly

## 2018-05-07 NOTE — Assessment & Plan Note (Signed)
Continue Pantoprazole indefinitely

## 2018-05-10 ENCOUNTER — Telehealth: Payer: Self-pay | Admitting: *Deleted

## 2018-05-10 NOTE — Telephone Encounter (Signed)
Doni-please inform patient/daughter that I think it is reasonable to hold off appointment with me at this time.   I would pushing out visit to 4-6 weeks from now.   GB

## 2018-05-10 NOTE — Telephone Encounter (Signed)
Patient's daughter notified of Dr. Sharmaine Base recommendations and verbalized understanding.

## 2018-05-10 NOTE — Telephone Encounter (Signed)
Patient's daughter left a message on the triage line asking about the appointments scheduled for 3/23 with Billey Chang, NP and Dr. Rogue Bussing. She stated that her father is 83 years old, on "lock down" at [facility], and she wanted to know what to do about his appointments. Please advise.

## 2018-05-17 ENCOUNTER — Other Ambulatory Visit: Payer: Medicare Other

## 2018-05-17 ENCOUNTER — Ambulatory Visit: Payer: Medicare Other | Admitting: Internal Medicine

## 2018-05-17 ENCOUNTER — Ambulatory Visit: Payer: Medicare Other

## 2018-05-17 ENCOUNTER — Encounter: Payer: Medicare Other | Admitting: Hospice and Palliative Medicine

## 2018-06-09 ENCOUNTER — Telehealth: Payer: Self-pay | Admitting: *Deleted

## 2018-06-09 NOTE — Telephone Encounter (Signed)
Ok. Yes, Dr. B wanted to do a telehealth apt.

## 2018-06-09 NOTE — Telephone Encounter (Signed)
-----   Message from Cammie Sickle, MD sent at 06/09/2018  3:26 PM EDT ----- Regarding: RE: 06/14/18 appts H/C/B- it is okay to push the appointments to mid June/please inform daughter and schedule accordingly. Thx ----- Message ----- From: Maryann Conners, NT Sent: 06/09/2018   3:04 PM EDT To: Ronnell Guadalajara, RN, # Subject: 06/14/18 appts                                  Patient's (daughter) Ascencion Dike called in reference to pts 06/14/18 lab/MD/Palliative Care/+/-Lupron/+/-Prolia Inj. She said that [facility] advised her to push appts out until mid June. She asked if someone could please give her a call back @ [phone].   Thanks, [employer]

## 2018-06-09 NOTE — Telephone Encounter (Signed)
Daughter called requesting a call back from Dr Sharmaine Base nurse as she has some questions for his upcoming appointment Monday to be addressed and she will not be able to come in with hi. Please return her call [phone]

## 2018-06-14 ENCOUNTER — Ambulatory Visit: Payer: Medicare Other | Admitting: Internal Medicine

## 2018-06-14 ENCOUNTER — Encounter: Payer: Medicare Other | Admitting: Hospice and Palliative Medicine

## 2018-06-14 ENCOUNTER — Other Ambulatory Visit: Payer: Medicare Other

## 2018-06-14 ENCOUNTER — Inpatient Hospital Stay: Payer: Medicare Other

## 2018-07-08 ENCOUNTER — Encounter: Payer: Self-pay | Admitting: Internal Medicine

## 2018-07-08 ENCOUNTER — Ambulatory Visit: Payer: Medicare Other | Admitting: Internal Medicine

## 2018-07-08 VITALS — BP 112/64 | HR 65 | Temp 98.4°F | Resp 18 | Wt 175.6 lb

## 2018-07-08 DIAGNOSIS — K5909 Other constipation: Secondary | ICD-10-CM | POA: Diagnosis not present

## 2018-07-08 DIAGNOSIS — I10 Essential (primary) hypertension: Secondary | ICD-10-CM | POA: Diagnosis not present

## 2018-07-08 DIAGNOSIS — I872 Venous insufficiency (chronic) (peripheral): Secondary | ICD-10-CM | POA: Diagnosis not present

## 2018-07-08 DIAGNOSIS — M150 Primary generalized (osteo)arthritis: Secondary | ICD-10-CM

## 2018-07-08 DIAGNOSIS — G463 Brain stem stroke syndrome: Secondary | ICD-10-CM

## 2018-07-08 DIAGNOSIS — C61 Malignant neoplasm of prostate: Secondary | ICD-10-CM | POA: Diagnosis not present

## 2018-07-08 DIAGNOSIS — M159 Polyosteoarthritis, unspecified: Secondary | ICD-10-CM

## 2018-07-08 LAB — CBC AND DIFFERENTIAL
HCT: 38 — AB (ref 41–53)
Hemoglobin: 12.8 — AB (ref 13.5–17.5)

## 2018-07-08 NOTE — Assessment & Plan Note (Signed)
Has done well lately Just uses the tylenol prn

## 2018-07-08 NOTE — Assessment & Plan Note (Signed)
No edema with the support hose Stasis changes but no ulcers

## 2018-07-08 NOTE — Assessment & Plan Note (Signed)
Doing well on his current regimen 

## 2018-07-08 NOTE — Progress Notes (Signed)
Subjective:    Patient ID: Benjamin Villarreal, male    DOB: 20-Oct-1920, 85 y.o.   MRN: [id number]  HPI Visit in assisted living apartment for review of chronic health conditions Reviewed status with Luellen Pucker RN  Has had both lupron and zytiga on hold Concerned it was causing fatigue, etc Feels well now---hard to remember if he is better now PSA had been staying low----will be monitored Voids okay--flow fine and empties well when he pushes it out at the end. No   Bowels are fine Usually goes every morning Does have a fair amount of gas--but only when on commode  Hasn't had dizziness or vertigo lately Only walks with walker now--although not always when just in his room  No sig problems with arthritis Hasn't needed the tylenol regularly  Current Outpatient Medications on File Prior to Visit  Medication Sig Dispense Refill  . abiraterone acetate (ZYTIGA) 250 MG tablet TAKE 1 TABLET (250 MG TOTAL) BY MOUTH DAILY. TAKE WITH A LOW FAT MEAL. 30 tablet 2  . acetaminophen (TYLENOL) 325 MG tablet Take 325-650 mg by mouth every 4 (four) hours as needed for fever (general discomfort).     Marland Kitchen aspirin EC 81 MG tablet Take 81 mg by mouth every other day.    . clindamycin (CLEOCIN) 150 MG capsule Take 4 capsules by mouth as needed. Prior to dental work    . cyanocobalamin (,VITAMIN B-12,) 1000 MCG/ML injection Inject 1,000 mcg into the muscle every 30 (thirty) days.    Marland Kitchen denosumab (PROLIA) 60 MG/ML SOSY injection Inject 60 mg into the skin every 6 (six) months.    . furosemide (LASIX) 40 MG tablet Take 1 tablet by mouth daily.    Marland Kitchen latanoprost (XALATAN) 0.005 % ophthalmic solution Place 1 drop into both eyes at bedtime.    . mupirocin ointment (BACTROBAN) 2 % Place 1 application into the nose daily.    Marland Kitchen OVER THE COUNTER MEDICATION Take 3 capsules by mouth daily.    Marland Kitchen OVER THE COUNTER MEDICATION Take 2 capsules by mouth every morning.    . pantoprazole (PROTONIX) 40 MG tablet Take 1 tablet (40 mg total)  by mouth daily. 60 tablet 0  . polyethylene glycol powder (GLYCOLAX/MIRALAX) powder FILL TO LINE (17 GRAMS ), MIX AND DRINK TWICE A DAY AND 8.5 GRAMS AT NOON 510 g 3  . sennosides-docusate sodium (SENOKOT-S) 8.6-50 MG tablet Take 2 tablets by mouth every evening.     . Skin Protectants, Misc. (EUCERIN) cream Apply 1 application topically 2 (two) times daily as needed for dry skin.     Current Facility-Administered Medications on File Prior to Visit  Medication Dose Route Frequency Provider Last Rate Last Dose  . leuprolide (LUPRON) injection 30 mg  30 mg Intramuscular Once Leia Alf, MD        Allergies  Allergen Reactions  . Other     RAW ONIONS- SINUS REACTION    Past Medical History:  Diagnosis Date  . Aortic atherosclerosis ([place])    Noted on CT  . Arthritis   . Colon polyps    adenomatous  . History of fracture of tibia   . History of tachycardia    patient unaware of this  . Kyphosis of cervicothoracic region    SEVERE  . Osteoporosis 01/2014   T -3.8 (01/2014)  . Other constipation    chronic  . Prostate cancer [facility]) 1996   surgery then lupron  . Skin cancer, basal cell 2004   surgical  resection.  Marland Kitchen Unspecified venous (peripheral) insufficiency   . Vitamin B12 deficiency     Past Surgical History:  Procedure Laterality Date  . BASAL CELL CARCINOMA EXCISION  2004   Left side of face  . CATARACT EXTRACTION Bilateral 2000, 2003   both eyes  . COLONOSCOPY     h/o adenomatous polyps  . CYSTOSCOPY W/ RETROGRADES Bilateral 11/19/2016   Procedure: CYSTOSCOPY WITH RETROGRADE PYELOGRAM;  Surgeon: Hollice Espy, MD;  Location: [facility] ORS;  Service: Urology;  Laterality: Bilateral;  General vs. Spinal  . CYSTOSCOPY W/ URETERAL STENT PLACEMENT Left 02/25/2017   Procedure: CYSTOSCOPY WITH STENT REPLACEMENT;  Surgeon: Hollice Espy, MD;  Location: [facility] ORS;  Service: Urology;  Laterality: Left;  . CYSTOSCOPY WITH URETEROSCOPY AND STENT PLACEMENT Left 11/19/2016    Procedure: CYSTOSCOPY WITH URETEROSCOPY AND STENT PLACEMENT;  Surgeon: Hollice Espy, MD;  Location: [facility] OR[facility];  Service: Urology;  Laterality: Left;  . ESOPHAGOGASTRODUODENOSCOPY N/A 10/31/2017   Procedure: ESOPHAGOGASTRODUODENOSCOPY (EGD) with biopsy and brushings;  Surgeon: Toledo, Benay Pike, MD;  Location: [facility] ENDOS[facility];  Service: Gastroenterology;  Laterality: N/A;  . FRACTURE SURGERY    . JOINT REPLACEMENT  2005   Left knee  . JOINT REPLACEMENT  12/13   Right knee  . PROSTATE SURGERY  1996   cancer  . sigmoid resection    . TIBIA FRACTURE SURGERY  1999  . URETERAL BIOPSY Left 11/19/2016   Procedure: URETERAL BIOPSY;  Surgeon: Hollice Espy, MD;  Location: [facility] ORS;  Service: Urology;  Laterality: Left;  Marland Kitchen VOLVULUS REDUCTION  12/10    Family History  Problem Relation Age of Onset  . Leukemia Mother   . Diabetes Son   . Prostate cancer Neg Hx   . Bladder Cancer Neg Hx   . Kidney cancer Neg Hx     Social History   Socioeconomic History  . Marital status: Widowed    Spouse name: [name]  . Number of children: 3  . Years of education: Not on file  . Highest education level: Not on file  Occupational History  . 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 Financial resource strain: Not on file  . Food insecurity:    Worry: Not on file    Inability: Not on file  . Transportation needs:    Medical: Not on file    Non-medical: Not on file  Tobacco Use  . Smoking status: Never Smoker  . Smokeless tobacco: Never Used  Substance and Sexual Activity  . Alcohol use: No    Alcohol/week: 0.0 - 1.0 standard drinks    Frequency: Never    Comment: in past  . Drug use: No  . Sexual activity: Not Currently  Lifestyle  . Physical activity:    Days per week: Not on file    Minutes per session: Not on file  . Stress: Not on file  Relationships  . Social connections:    Talks on phone: Not on file    Gets together: Not on file    Attends religious  service: Not on file    Active member of club or organization: Not on file    Attends meetings of clubs or organizations: Not on file    Relationship status: Not on file  . Intimate partner violence:    Fear of current or ex partner: Not on file    Emotionally abused: Not on file    Physically abused: Not on file    Forced  sexual activity: Not on file  Other Topics Concern  . Not on file  Social History Narrative   Has living will   Daughter [name] holds health care POA.   Has DNR and we have reviewed this.   Would not want tube feedings if cognitively unaware   Review of Systems  Appetite is fine Weight going down slightly--overall fairly consistent Sleeps fine No skin problems--other than mild redness on calves (stasis changes) and some dry skin No heartburn but occasionally feels some brief substernal discomfort when initially lying down---usually better if he just adjusts his position     Objective:   Physical Exam  Constitutional: He appears well-developed.  Neck: No thyromegaly present.  Cardiovascular: Normal rate and regular rhythm. Exam reveals no gallop.  Gr 3/6 blowing systolic murmur at apex  Respiratory: Effort normal and breath sounds normal. No respiratory distress. He has no wheezes. He has no rales.  GI: Soft. There is no abdominal tenderness.  Psychiatric: He has a normal mood and affect. His behavior is normal.           Assessment & Plan:

## 2018-07-08 NOTE — Assessment & Plan Note (Signed)
Hard for him to remember, but he feels well now off the Rx and was having fatigue before He is not opposed to restarting Rx if his PSA spikes up though

## 2018-07-08 NOTE — Assessment & Plan Note (Signed)
Seems to be quiet No vertigo lately

## 2018-07-28 ENCOUNTER — Telehealth: Payer: Self-pay | Admitting: *Deleted

## 2018-07-28 NOTE — Telephone Encounter (Signed)
Luellen Pucker called about the patient. He has daughters and one of them wants him to come over and get labs and see where he is from prognosis. The other one does not want him to come out and potentially get covid virus.  She wondered if she can draw psa level there and fax it to [place] and then the daughters can decide about whether pt needs visit on 6/16. Luellen Pucker says that patient had cbc/d, met. Panel on 5/14 and she can fax them.  Dr. Rogue Bussing said ok for the PSA and a repeat met b and then based on that the decision with daughters as to whether the patient needs or wants to come in. Luellen Pucker will do blood work a few days before appt and send results to [phone]

## 2018-07-29 DIAGNOSIS — D696 Thrombocytopenia, unspecified: Secondary | ICD-10-CM | POA: Diagnosis not present

## 2018-07-29 DIAGNOSIS — I10 Essential (primary) hypertension: Secondary | ICD-10-CM | POA: Diagnosis not present

## 2018-07-29 DIAGNOSIS — C61 Malignant neoplasm of prostate: Secondary | ICD-10-CM | POA: Diagnosis not present

## 2018-08-09 ENCOUNTER — Telehealth: Payer: Self-pay | Admitting: Internal Medicine

## 2018-08-09 DIAGNOSIS — C61 Malignant neoplasm of prostate: Secondary | ICD-10-CM

## 2018-08-09 NOTE — Addendum Note (Signed)
Addended by: Sabino Gasser on: 08/09/2018 01:48 PM   Modules accepted: Orders

## 2018-08-09 NOTE — Telephone Encounter (Signed)
Reviewed the labs from the nursing home.  Discussed with the patient's daughter in detail.  Cancel appointments this week.Please reschedule appointments-as below  #Follow-up in 3 months-MD/labs CBC CMP PSA-/palliative care, Josh-

## 2018-08-10 ENCOUNTER — Inpatient Hospital Stay: Payer: Medicare Other | Admitting: Internal Medicine

## 2018-08-10 ENCOUNTER — Inpatient Hospital Stay: Payer: Medicare Other | Admitting: Hospice and Palliative Medicine

## 2018-08-10 ENCOUNTER — Other Ambulatory Visit: Payer: Medicare Other

## 2018-08-10 ENCOUNTER — Inpatient Hospital Stay: Payer: Medicare Other

## 2018-10-22 ENCOUNTER — Encounter: Payer: Self-pay | Admitting: Internal Medicine

## 2018-10-22 ENCOUNTER — Telehealth: Payer: Self-pay | Admitting: Internal Medicine

## 2018-10-22 ENCOUNTER — Ambulatory Visit (INDEPENDENT_AMBULATORY_CARE_PROVIDER_SITE_OTHER): Payer: Medicare Other | Admitting: Internal Medicine

## 2018-10-22 DIAGNOSIS — U071 COVID-19: Secondary | ICD-10-CM

## 2018-10-22 NOTE — Telephone Encounter (Signed)
Patient's daughter, [name], called. Patient tested positive for Covid.  Dr.Letvak had asked her what the family would like to do if patient shows symptoms.  [name] spoke to patient and her brother and sister and they all agreed for patient to stay at [facility].

## 2018-10-22 NOTE — Progress Notes (Signed)
Subjective:    Patient ID: Benjamin Villarreal, male    DOB: Jun 11, 1920, 85 y.o.   MRN: [id number]  HPI Tested positive for COVID--visit to assess and discuss care options Moved from [facility] assisted living to the [place] on their campus  Identification done Discussed billing and he gave consent He is on [employer] and I am in my office [place] facilitating visit (he is [facility])  He states he is feeling okay Having very mild trouble breathing  Some dry hacky cough No fever  Current Outpatient Medications on File Prior to Visit  Medication Sig Dispense Refill  . acetaminophen (TYLENOL) 325 MG tablet Take 325-650 mg by mouth every 4 (four) hours as needed for fever (general discomfort).     Marland Kitchen aspirin EC 81 MG tablet Take 81 mg by mouth every other day.    . clindamycin (CLEOCIN) 150 MG capsule Take 4 capsules by mouth as needed. Prior to dental work    . cyanocobalamin (,VITAMIN B-12,) 1000 MCG/ML injection Inject 1,000 mcg into the muscle every 30 (thirty) days.    . furosemide (LASIX) 40 MG tablet Take 1 tablet by mouth daily.    . mupirocin ointment (BACTROBAN) 2 % Place 1 application into the nose daily.    . pantoprazole (PROTONIX) 40 MG tablet Take 1 tablet (40 mg total) by mouth daily. 60 tablet 0  . polyethylene glycol powder (GLYCOLAX/MIRALAX) powder FILL TO LINE (17 GRAMS ), MIX AND DRINK TWICE A DAY AND 8.5 GRAMS AT NOON 510 g 3  . sennosides-docusate sodium (SENOKOT-S) 8.6-50 MG tablet Take 2 tablets by mouth every evening.     . Skin Protectants, Misc. (EUCERIN) cream Apply 1 application topically 2 (two) times daily as needed for dry skin.    Marland Kitchen abiraterone acetate (ZYTIGA) 250 MG tablet TAKE 1 TABLET (250 MG TOTAL) BY MOUTH DAILY. TAKE WITH A LOW FAT MEAL. 30 tablet 2  . denosumab (PROLIA) 60 MG/ML SOSY injection Inject 60 mg into the skin every 6 (six) months.    . latanoprost (XALATAN) 0.005 % ophthalmic solution Place 1 drop into both eyes at bedtime.      Current Facility-Administered Medications on File Prior to Visit  Medication Dose Route Frequency Provider Last Rate Last Dose  . leuprolide (LUPRON) injection 30 mg  30 mg Intramuscular Once Leia Alf, MD        Allergies  Allergen Reactions  . Other     RAW ONIONS- SINUS REACTION    Past Medical History:  Diagnosis Date  . Aortic atherosclerosis ([place])    Noted on CT  . Arthritis   . Colon polyps    adenomatous  . History of fracture of tibia   . History of tachycardia    patient unaware of this  . Kyphosis of cervicothoracic region    SEVERE  . Osteoporosis 01/2014   T -3.8 (01/2014)  . Other constipation    chronic  . Prostate cancer [facility]) 1996   surgery then lupron  . Skin cancer, basal cell 2004   surgical resection.  Marland Kitchen Unspecified venous (peripheral) insufficiency   . Vitamin B12 deficiency     Past Surgical History:  Procedure Laterality Date  . BASAL CELL CARCINOMA EXCISION  2004   Left side of face  . CATARACT EXTRACTION Bilateral 2000, 2003   both eyes  . COLONOSCOPY     h/o adenomatous polyps  . CYSTOSCOPY W/ RETROGRADES Bilateral 11/19/2016   Procedure: CYSTOSCOPY WITH  RETROGRADE PYELOGRAM;  Surgeon: Hollice Espy, MD;  Location: [facility] ORS;  Service: Urology;  Laterality: Bilateral;  General vs. Spinal  . CYSTOSCOPY W/ URETERAL STENT PLACEMENT Left 02/25/2017   Procedure: CYSTOSCOPY WITH STENT REPLACEMENT;  Surgeon: Hollice Espy, MD;  Location: [facility] ORS;  Service: Urology;  Laterality: Left;  . CYSTOSCOPY WITH URETEROSCOPY AND STENT PLACEMENT Left 11/19/2016   Procedure: CYSTOSCOPY WITH URETEROSCOPY AND STENT PLACEMENT;  Surgeon: Hollice Espy, MD;  Location: [facility] ORS;  Service: Urology;  Laterality: Left;  . ESOPHAGOGASTRODUODENOSCOPY N/A 10/31/2017   Procedure: ESOPHAGOGASTRODUODENOSCOPY (EGD) with biopsy and brushings;  Surgeon: Toledo, Benay Pike, MD;  Location: [facility] ENDOSCOPY;  Service: Gastroenterology;  Laterality: N/A;  . FRACTURE  SURGERY    . JOINT REPLACEMENT  2005   Left knee  . JOINT REPLACEMENT  12/13   Right knee  . PROSTATE SURGERY  1996   cancer  . sigmoid resection    . TIBIA FRACTURE SURGERY  1999  . URETERAL BIOPSY Left 11/19/2016   Procedure: URETERAL BIOPSY;  Surgeon: Hollice Espy, MD;  Location: [facility] ORS;  Service: Urology;  Laterality: Left;  Marland Kitchen VOLVULUS REDUCTION  12/10    Family History  Problem Relation Age of Onset  . Leukemia Mother   . Diabetes Son   . Prostate cancer Neg Hx   . Bladder Cancer Neg Hx   . Kidney cancer Neg Hx     Social History   Socioeconomic History  . Marital status: Widowed    Spouse name: Benjamin Villarreal  . Number of children: 3  . Years of education: Not on file  . Highest education level: Not on file  Occupational History  . 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 Financial resource strain: Not on file  . Food insecurity    Worry: Not on file    Inability: Not on file  . Transportation needs    Medical: Not on file    Non-medical: Not on file  Tobacco Use  . Smoking status: Never Smoker  . Smokeless tobacco: Never Used  Substance and Sexual Activity  . Alcohol use: No    Alcohol/week: 0.0 - 1.0 standard drinks    Frequency: Never    Comment: in past  . Drug use: No  . Sexual activity: Not Currently  Lifestyle  . Physical activity    Days per week: Not on file    Minutes per session: Not on file  . Stress: Not on file  Relationships  . Social Herbalist on phone: Not on file    Gets together: Not on file    Attends religious service: Not on file    Active member of club or organization: Not on file    Attends meetings of clubs or organizations: Not on file    Relationship status: Not on file  . Intimate partner violence    Fear of current or ex partner: Not on file    Emotionally abused: Not on file    Physically abused: Not on file    Forced sexual activity: Not on file  Other Topics Concern  . Not on  file  Social History Narrative   Has living will   Daughter [name] holds health care POA.   Has DNR and we have reviewed this.   Would not want tube feedings if cognitively unaware    Review of Systems Eating okay Had been staying up late--trying to assure he gets adequate sleep Did  get shower with maximal help---- really tired him out    Objective:   Physical Exam  Constitutional: He appears well-developed. No distress.  Respiratory: Effort normal. No respiratory distress.  Psychiatric: He has a normal mood and affect. His behavior is normal.           Assessment & Plan:

## 2018-10-22 NOTE — Assessment & Plan Note (Signed)
Mild symptoms now Discussed the possibility of worsening after a week or so Reviewed plans with him--he doesn't think he would want transfer to the hospital (and I would recommend highly against it) Extended phone call with daughter/POA Anne--she will review this with her sibs--but also feels that they wouldn't want him to go to hospital  Currently okay without oxygen Steroids only shown beneficial with the sickest patients--I doubt there is any reason to try prednisone (reviewed with out pharmacist) They have my cell phone in the [place] for access to me 24/7

## 2018-10-23 NOTE — Telephone Encounter (Signed)
Okay--that is what I expected

## 2018-10-26 ENCOUNTER — Telehealth: Payer: Self-pay | Admitting: Internal Medicine

## 2018-10-26 MED ORDER — MORPHINE SULFATE (CONCENTRATE) 20 MG/ML PO SOLN: 5.0000 mg | ORAL | 0 refills | Status: AC | PRN

## 2018-10-26 NOTE — Telephone Encounter (Signed)
Benjamine Mola (DPR signed) left v/m requesting cb about pt condition and covid. I left v/m asking Benjamine Mola to call [facility] back.

## 2018-10-26 NOTE — Telephone Encounter (Signed)
Multiple communications about him from [facility] staff---Audrey RN and Carmell Austria (aide) this morning  Worsening hypoxia---especially when moving to bathroom Oxygen helps but sats still in 80's at best Comfortable if not walking Working on getting extension tubing for oxygen and wheelchair Looking into hospice  Rx for roxanol for comfort Will likely need nurse in his isolation unit as his status worsens  PC with daughter Truman Hayward also Want to work on comfort for end of life care  May need to add lorazepam or other benzo if anxiety

## 2018-10-27 NOTE — Telephone Encounter (Signed)
I spoke with Benjamin Villarreal; and Benjamin Villarreal said that she actually spoke to Dr Silvio Pate yesterday and nothing further needed at this time.

## 2018-10-28 ENCOUNTER — Telehealth: Payer: Self-pay | Admitting: *Deleted

## 2018-10-28 DIAGNOSIS — D649 Anemia, unspecified: Secondary | ICD-10-CM | POA: Diagnosis not present

## 2018-10-28 DIAGNOSIS — Z8679 Personal history of other diseases of the circulatory system: Secondary | ICD-10-CM | POA: Diagnosis not present

## 2018-10-28 DIAGNOSIS — J1289 Other viral pneumonia: Secondary | ICD-10-CM | POA: Diagnosis not present

## 2018-10-28 DIAGNOSIS — H9190 Unspecified hearing loss, unspecified ear: Secondary | ICD-10-CM | POA: Diagnosis not present

## 2018-10-28 DIAGNOSIS — M1990 Unspecified osteoarthritis, unspecified site: Secondary | ICD-10-CM | POA: Diagnosis not present

## 2018-10-28 DIAGNOSIS — J9601 Acute respiratory failure with hypoxia: Secondary | ICD-10-CM | POA: Diagnosis not present

## 2018-10-28 DIAGNOSIS — Z8719 Personal history of other diseases of the digestive system: Secondary | ICD-10-CM | POA: Diagnosis not present

## 2018-10-28 DIAGNOSIS — Z8546 Personal history of malignant neoplasm of prostate: Secondary | ICD-10-CM | POA: Diagnosis not present

## 2018-10-28 DIAGNOSIS — K219 Gastro-esophageal reflux disease without esophagitis: Secondary | ICD-10-CM | POA: Diagnosis not present

## 2018-10-28 DIAGNOSIS — Z9981 Dependence on supplemental oxygen: Secondary | ICD-10-CM | POA: Diagnosis not present

## 2018-10-28 DIAGNOSIS — U071 COVID-19: Secondary | ICD-10-CM | POA: Diagnosis not present

## 2018-10-28 DIAGNOSIS — I872 Venous insufficiency (chronic) (peripheral): Secondary | ICD-10-CM | POA: Diagnosis not present

## 2018-10-28 DIAGNOSIS — Z862 Personal history of diseases of the blood and blood-forming organs and certain disorders involving the immune mechanism: Secondary | ICD-10-CM | POA: Diagnosis not present

## 2018-10-28 DIAGNOSIS — I700 Atherosclerosis of aorta: Secondary | ICD-10-CM | POA: Diagnosis not present

## 2018-10-28 DIAGNOSIS — M810 Age-related osteoporosis without current pathological fracture: Secondary | ICD-10-CM | POA: Diagnosis not present

## 2018-10-28 DIAGNOSIS — I739 Peripheral vascular disease, unspecified: Secondary | ICD-10-CM | POA: Diagnosis not present

## 2018-10-28 NOTE — Telephone Encounter (Signed)
I have already sent an order to [facility] several days ago for roxanol Order is 0.25-0.5 cc PO hourly prn for SOB or pain

## 2018-10-28 NOTE — Telephone Encounter (Signed)
Katie with Hospice called triage regarding pt. She said pt does have covid and on hospice. Pt is becoming more SOB and pt's daughter is very concerned about pt. Joellen Jersey is requesting that Dr. Silvio Pate start pt on Morphine due to his decline and is increase SOB. Katie said Rx can be sent to [place] but she request call back with dosing/instructions so Hospice will have info also.  Katie CB# [phone]

## 2018-10-28 NOTE — Telephone Encounter (Signed)
I confirmed with Milta Deiters that it was sent to [facility]. Per Dr Silvio Pate, I called and spoke to [facility] and advised her to call Luellen Pucker at [facility].

## 2018-10-29 DIAGNOSIS — U071 COVID-19: Secondary | ICD-10-CM | POA: Diagnosis not present

## 2018-10-29 DIAGNOSIS — K219 Gastro-esophageal reflux disease without esophagitis: Secondary | ICD-10-CM | POA: Diagnosis not present

## 2018-10-29 DIAGNOSIS — J9601 Acute respiratory failure with hypoxia: Secondary | ICD-10-CM | POA: Diagnosis not present

## 2018-10-29 DIAGNOSIS — J1289 Other viral pneumonia: Secondary | ICD-10-CM | POA: Diagnosis not present

## 2018-10-29 DIAGNOSIS — I872 Venous insufficiency (chronic) (peripheral): Secondary | ICD-10-CM | POA: Diagnosis not present

## 2018-10-29 DIAGNOSIS — I700 Atherosclerosis of aorta: Secondary | ICD-10-CM | POA: Diagnosis not present

## 2018-10-30 ENCOUNTER — Inpatient Hospital Stay (HOSPITAL_COMMUNITY)
Admission: AD | Admit: 2018-10-30 | Discharge: 2018-11-25 | DRG: 951 | Disposition: E | Source: Skilled Nursing Facility | Attending: Family Medicine | Admitting: Family Medicine

## 2018-10-30 ENCOUNTER — Emergency Department

## 2018-10-30 ENCOUNTER — Other Ambulatory Visit: Payer: Self-pay

## 2018-10-30 ENCOUNTER — Emergency Department
Admission: EM | Admit: 2018-10-30 | Discharge: 2018-10-30 | Disposition: A | Discharge status: Other Acute Inpatient Hospital | Attending: Emergency Medicine | Admitting: Emergency Medicine

## 2018-10-30 DIAGNOSIS — Z833 Family history of diabetes mellitus: Secondary | ICD-10-CM

## 2018-10-30 DIAGNOSIS — G9341 Metabolic encephalopathy: Secondary | ICD-10-CM | POA: Diagnosis not present

## 2018-10-30 DIAGNOSIS — J849 Interstitial pulmonary disease, unspecified: Secondary | ICD-10-CM | POA: Diagnosis present

## 2018-10-30 DIAGNOSIS — C61 Malignant neoplasm of prostate: Secondary | ICD-10-CM | POA: Diagnosis present

## 2018-10-30 DIAGNOSIS — R0603 Acute respiratory distress: Secondary | ICD-10-CM | POA: Diagnosis not present

## 2018-10-30 DIAGNOSIS — Z85828 Personal history of other malignant neoplasm of skin: Secondary | ICD-10-CM

## 2018-10-30 DIAGNOSIS — K219 Gastro-esophageal reflux disease without esophagitis: Secondary | ICD-10-CM | POA: Diagnosis not present

## 2018-10-30 DIAGNOSIS — R0689 Other abnormalities of breathing: Secondary | ICD-10-CM | POA: Diagnosis not present

## 2018-10-30 DIAGNOSIS — I700 Atherosclerosis of aorta: Secondary | ICD-10-CM | POA: Diagnosis present

## 2018-10-30 DIAGNOSIS — I959 Hypotension, unspecified: Secondary | ICD-10-CM | POA: Diagnosis present

## 2018-10-30 DIAGNOSIS — R918 Other nonspecific abnormal finding of lung field: Secondary | ICD-10-CM | POA: Diagnosis not present

## 2018-10-30 DIAGNOSIS — Z86010 Personal history of colonic polyps: Secondary | ICD-10-CM

## 2018-10-30 DIAGNOSIS — M810 Age-related osteoporosis without current pathological fracture: Secondary | ICD-10-CM | POA: Diagnosis present

## 2018-10-30 DIAGNOSIS — J9601 Acute respiratory failure with hypoxia: Secondary | ICD-10-CM | POA: Diagnosis present

## 2018-10-30 DIAGNOSIS — Z806 Family history of leukemia: Secondary | ICD-10-CM | POA: Diagnosis not present

## 2018-10-30 DIAGNOSIS — Z79899 Other long term (current) drug therapy: Secondary | ICD-10-CM | POA: Insufficient documentation

## 2018-10-30 DIAGNOSIS — N179 Acute kidney failure, unspecified: Secondary | ICD-10-CM | POA: Diagnosis present

## 2018-10-30 DIAGNOSIS — J1289 Other viral pneumonia: Secondary | ICD-10-CM | POA: Diagnosis present

## 2018-10-30 DIAGNOSIS — R4182 Altered mental status, unspecified: Secondary | ICD-10-CM | POA: Diagnosis not present

## 2018-10-30 DIAGNOSIS — Z66 Do not resuscitate: Secondary | ICD-10-CM | POA: Diagnosis present

## 2018-10-30 DIAGNOSIS — I2699 Other pulmonary embolism without acute cor pulmonale: Secondary | ICD-10-CM | POA: Diagnosis present

## 2018-10-30 DIAGNOSIS — Z7982 Long term (current) use of aspirin: Secondary | ICD-10-CM

## 2018-10-30 DIAGNOSIS — Z515 Encounter for palliative care: Principal | ICD-10-CM

## 2018-10-30 DIAGNOSIS — K5909 Other constipation: Secondary | ICD-10-CM | POA: Diagnosis present

## 2018-10-30 DIAGNOSIS — R0902 Hypoxemia: Secondary | ICD-10-CM | POA: Diagnosis not present

## 2018-10-30 DIAGNOSIS — U071 COVID-19: Secondary | ICD-10-CM | POA: Insufficient documentation

## 2018-10-30 DIAGNOSIS — I872 Venous insufficiency (chronic) (peripheral): Secondary | ICD-10-CM | POA: Diagnosis not present

## 2018-10-30 LAB — TRIGLYCERIDES: Triglycerides: 223 mg/dL — ABNORMAL HIGH (ref <150)

## 2018-10-30 LAB — CBC WITH DIFFERENTIAL/PLATELET
Abs Immature Granulocytes: 0.37 10*3/uL — ABNORMAL HIGH (ref 0.00–0.07)
Basophils Absolute: 0.1 10*3/uL (ref 0.0–0.1)
Basophils Relative: 0 %
Eosinophils Absolute: 0 10*3/uL (ref 0.0–0.5)
Eosinophils Relative: 0 %
HCT: 39.9 % (ref 39.0–52.0)
Hemoglobin: 12.3 g/dL — ABNORMAL LOW (ref 13.0–17.0)
Immature Granulocytes: 1 %
Lymphocytes Relative: 5 %
Lymphs Abs: 1.3 10*3/uL (ref 0.7–4.0)
MCH: 27.8 pg (ref 26.0–34.0)
MCHC: 30.8 g/dL (ref 30.0–36.0)
MCV: 90.3 fL (ref 80.0–100.0)
Monocytes Absolute: 1 10*3/uL (ref 0.1–1.0)
Monocytes Relative: 4 %
Neutro Abs: 23.1 10*3/uL — ABNORMAL HIGH (ref 1.7–7.7)
Neutrophils Relative %: 90 %
Platelets: 134 10*3/uL — ABNORMAL LOW (ref 150–400)
RBC: 4.42 MIL/uL (ref 4.22–5.81)
RDW: 16.1 % — ABNORMAL HIGH (ref 11.5–15.5)
WBC: 25.9 10*3/uL — ABNORMAL HIGH (ref 4.0–10.5)
nRBC: 0.8 % — ABNORMAL HIGH (ref 0.0–0.2)

## 2018-10-30 LAB — COMPREHENSIVE METABOLIC PANEL
ALT: 37 U/L (ref 0–44)
AST: 48 U/L — ABNORMAL HIGH (ref 15–41)
Albumin: 3 g/dL — ABNORMAL LOW (ref 3.5–5.0)
Alkaline Phosphatase: 91 U/L (ref 38–126)
Anion gap: 19 — ABNORMAL HIGH (ref 5–15)
BUN: 59 mg/dL — ABNORMAL HIGH (ref 8–23)
CO2: 19 mmol/L — ABNORMAL LOW (ref 22–32)
Calcium: 8.8 mg/dL — ABNORMAL LOW (ref 8.9–10.3)
Chloride: 111 mmol/L (ref 98–111)
Creatinine, Ser: 2.53 mg/dL — ABNORMAL HIGH (ref 0.61–1.24)
GFR calc Af Amer: 24 mL/min — ABNORMAL LOW (ref >60)
GFR calc non Af Amer: 21 mL/min — ABNORMAL LOW (ref >60)
Glucose, Bld: 331 mg/dL — ABNORMAL HIGH (ref 70–99)
Potassium: 3.6 mmol/L (ref 3.5–5.1)
Sodium: 149 mmol/L — ABNORMAL HIGH (ref 135–145)
Total Bilirubin: 0.8 mg/dL (ref 0.3–1.2)
Total Protein: 7.6 g/dL (ref 6.5–8.1)

## 2018-10-30 LAB — SARS CORONAVIRUS 2 BY RT PCR (HOSPITAL ORDER, PERFORMED IN ~~LOC~~ HOSPITAL LAB): SARS Coronavirus 2: POSITIVE — AB

## 2018-10-30 LAB — FIBRINOGEN: Fibrinogen: 228 mg/dL (ref 210–475)

## 2018-10-30 LAB — C-REACTIVE PROTEIN: CRP: 24.3 mg/dL — ABNORMAL HIGH (ref <1.0)

## 2018-10-30 LAB — LACTATE DEHYDROGENASE: LDH: 561 U/L — ABNORMAL HIGH (ref 98–192)

## 2018-10-30 LAB — FERRITIN: Ferritin: 428 ng/mL — ABNORMAL HIGH (ref 24–336)

## 2018-10-30 LAB — LACTIC ACID, PLASMA: Lactic Acid, Venous: 3.8 mmol/L (ref 0.5–1.9)

## 2018-10-30 LAB — FIBRIN DERIVATIVES D-DIMER (ARMC ONLY): Fibrin derivatives D-dimer (ARMC): >10000 ng/mL (FEU) — ABNORMAL HIGH (ref 0.00–499.00)

## 2018-10-30 LAB — PROCALCITONIN: Procalcitonin: 0.97 ng/mL

## 2018-10-30 MED ORDER — HALOPERIDOL LACTATE 2 MG/ML PO CONC
0.5000 mg | ORAL | Status: DC | PRN
  Filled 2018-10-30: qty 0.3

## 2018-10-30 MED ORDER — ACETAMINOPHEN 325 MG PO TABS: 650.0000 mg | ORAL_TABLET | Freq: Four times a day (QID) | ORAL | Status: DC | PRN

## 2018-10-30 MED ORDER — HALOPERIDOL 0.5 MG PO TABS
0.5000 mg | ORAL_TABLET | ORAL | Status: DC | PRN
  Filled 2018-10-30: qty 1

## 2018-10-30 MED ORDER — MORPHINE 100MG IN NS 100ML (1MG/ML) PREMIX INFUSION
5.0000 mg/h | INTRAVENOUS | Status: DC
  Administered 2018-10-30: 5 mg/h via INTRAVENOUS
  Filled 2018-10-30: qty 100

## 2018-10-30 MED ORDER — MORPHINE 100MG IN NS 100ML (1MG/ML) PREMIX INFUSION
1.0000 mg/h | INTRAVENOUS | Status: DC
  Administered 2018-10-30: 10 mg/h via INTRAVENOUS
  Administered 2018-10-30: 5 mg/h via INTRAVENOUS
  Filled 2018-10-30 (×3): qty 100

## 2018-10-30 MED ORDER — BIOTENE DRY MOUTH MT LIQD: 15.0000 mL | OROMUCOSAL | Status: DC | PRN

## 2018-10-30 MED ORDER — LORAZEPAM 2 MG/ML PO CONC
1.0000 mg | ORAL | Status: DC | PRN
  Filled 2018-10-30: qty 0.5

## 2018-10-30 MED ORDER — ONDANSETRON 4 MG PO TBDP: 4.0000 mg | ORAL_TABLET | Freq: Four times a day (QID) | ORAL | Status: DC | PRN

## 2018-10-30 MED ORDER — LORAZEPAM 0.5 MG PO TABS: 1.0000 mg | ORAL_TABLET | ORAL | Status: DC | PRN

## 2018-10-30 MED ORDER — GLYCOPYRROLATE 1 MG PO TABS
1.0000 mg | ORAL_TABLET | ORAL | Status: DC | PRN
  Filled 2018-10-30: qty 1

## 2018-10-30 MED ORDER — POLYVINYL ALCOHOL 1.4 % OP SOLN
1.0000 [drp] | Freq: Four times a day (QID) | OPHTHALMIC | Status: DC | PRN
  Filled 2018-10-30: qty 15

## 2018-10-30 MED ORDER — LORAZEPAM 2 MG/ML IJ SOLN: 1.0000 mg | INTRAMUSCULAR | Status: DC | PRN

## 2018-10-30 MED ORDER — SODIUM CHLORIDE 0.9 % IV BOLUS
1000.0000 mL | Freq: Once | INTRAVENOUS | Status: AC
  Administered 2018-10-30: 03:00:00 1000 mL via INTRAVENOUS

## 2018-10-30 MED ORDER — GLYCOPYRROLATE 0.2 MG/ML IJ SOLN: 0.2000 mg | INTRAMUSCULAR | Status: DC | PRN

## 2018-10-30 MED ORDER — HALOPERIDOL LACTATE 5 MG/ML IJ SOLN: 0.5000 mg | INTRAMUSCULAR | Status: DC | PRN

## 2018-10-30 MED ORDER — ACETAMINOPHEN 650 MG RE SUPP: 650.0000 mg | Freq: Four times a day (QID) | RECTAL | Status: DC | PRN

## 2018-10-30 MED ORDER — MORPHINE BOLUS VIA INFUSION
1.0000 mg | INTRAVENOUS | Status: DC | PRN
  Administered 2018-10-30: 2 mg via INTRAVENOUS
  Filled 2018-10-30: qty 2

## 2018-10-30 MED ORDER — ONDANSETRON HCL 4 MG/2ML IJ SOLN: 4.0000 mg | Freq: Four times a day (QID) | INTRAMUSCULAR | Status: DC | PRN

## 2018-11-02 ENCOUNTER — Telehealth: Payer: Self-pay

## 2018-11-02 NOTE — Telephone Encounter (Signed)
Yes--he was transferred to [place] where he died on 05/27/2022

## 2018-11-02 NOTE — Telephone Encounter (Signed)
[place] Night - Client Nonclinical Telephone Record AccessNurse Client [facility] Night - Client Client Site [place] - Night Physician Viviana Simpler - MD Contact Type Call Call [place] Page Now Who Is [place] Name [facility] Name [place] Number [phone] Patient Name Benjamin Villarreal Patient DOB 1921/01/29 Reason for Call Request to speak to Physician Initial Comment Caller states Caryl Pina from [facility] at [phone] , needs pain meds orders Additional Comment Paging DoctorName Phone DateTime Result/Outcome Message Type Notes Eliezer Lofts - MD [id number] 10/29/2018 11:14:22 PM Paged On Call Back to Call Center Doctor Paged Please call Amy w/ [facility] @ [phone] Eliezer Lofts - MD [id number] 10/29/2018 11:35:35 PM Called On Call Provider - Reached Doctor Paged Eliezer Lofts - MD 10/29/2018 11:35:44 PM Spoke with On Call - General Message Result Phoned on call. Information provided and transferred to caller. Call Closed By: [employer] Transaction Date/Time: 10/29/2018 11:07:03 PM (ET)

## 2018-11-02 NOTE — Telephone Encounter (Signed)
Per pts chart review pt was at [facility] ED on 15-Nov-2018 and per chart pt is deceased.

## 2018-11-04 LAB — CULTURE, BLOOD (ROUTINE X 2)
Culture: NO GROWTH
Culture: NO GROWTH
Special Requests: ADEQUATE

## 2018-11-10 ENCOUNTER — Encounter: Payer: Medicare Other | Admitting: Hospice and Palliative Medicine

## 2018-11-10 ENCOUNTER — Ambulatory Visit: Payer: Medicare Other | Admitting: Internal Medicine

## 2018-11-10 ENCOUNTER — Ambulatory Visit: Payer: Medicare Other

## 2018-11-25 NOTE — Progress Notes (Signed)
PROGRESS NOTE  Brief Narrative: Benjamin Villarreal is a 85 y.o. male who presented in respiratory distress due to progressive covid-19 pneumonia. Having recently transferred into hospice care at ALF, discussions with family and PCP as well as EDP at [facility] and admitting MD this morning have consistently confirmed DNR status and desire for comfort measures only.  Subjective: Nonverbal. No events recently reported.   Objective: BP (!) 136/124 (BP Location: Right Arm)   Pulse 78   Temp 99.1 F (37.3 C) (Axillary)   Resp (!) 25   SpO2 92%   Gen: Elderly critically ill male not in acute distress Pulm: Coarse, nonlabored with NRB in place  CV: Regular, no edema GI: Soft Neuro: Comfortable, not responsive.  Assessment & Plan: Patient was sent as directed admission from facility per his PCP, though EMS rerouted to [facility] due to vital sign/respiratory instability. There he was stabilized, with labs confirming severe infection and renal failure with severely elevated d-dimer suggestive of VTE as well. SARS-CoV-2 positive confirmed and patient was transferred to [facility] for end of life care. Morphine infusion is ongoing at 5mg /hr with good effect. Other comfort care orders have been placed if needed. Family visited patient at [facility] ED prior to transfer and will be allowed one-time visitation here, scheduled for 1:00pm. Anticipate hospital death on the course of hours.   Patrecia Pour, MD Pager [phone] 08-Nov-2018, 11:59 AM

## 2018-11-25 NOTE — Progress Notes (Signed)
Patient pronounced at 2024 by Alfredia Ferguson RN and Fredrich Romans RN.

## 2018-11-25 NOTE — Plan of Care (Addendum)
Pt here for end of life care  0700 bedside report received, checked morphine drip rate, updated the board  0800 assessment completed with vs and charted  0900 spoke with family about coming up to see father.  1200 called the daughter back and gave them the ok to come to the hospital  1400 daughter here to see patient for a 66min update, sent pt watch home with her  1430 pt brought back to his room from family room, pt appears to be comfortable but is still non responsive  1616 changed morphine rate to 10mg  /hr and decreases 02 to [facility], per doctors order  1745 end of shift I&O completed, text pharmacy for a new morphine bag

## 2018-11-25 NOTE — H&P (Signed)
History and Physical    Benjamin Villarreal [id number] DOB: 1921-02-20 DOA: 11-25-18  PCP: Venia Carbon, MD  Patient coming from: [facility] / facility  I have personally briefly reviewed patient's old medical records in [place]  Chief Complaint: Admission for end of life care  HPI: Benjamin Villarreal is a 85 y.o. male hospice patient who is actively dying from COVID-19.  I spoke with Dr. Silvio Pate earlier this evening and I accepted him as direct admit to [facility] to initiate comfort measures and palliative care (ie morphine gtt).  Apparently they couldn't start this at his facility.  I was later informed by Dr. Owens Shark (EDP at [facility]) that [name] felt patient was too unstable to make it to [facility] and instead they brought him to [facility] ED.  I let the EDP know about the above plan and if patient can be stabilized for transport on an NRB we would be more than happy to take him here.  Patient started on morphine gtt for comfort at [facility] ED.  Family who was here at [facility] waiting for patient, was informed, and left for [facility] to try and visit patient there and was able to visit patient in the ED at [facility]   ED Course: They were able to wean patient off of BIPAP and on to NRB with some improvement in O2 level, enough that they were able to transport patient here to [place].  While at [facility] they did obtain blood work before they knew about the goals of care: patient has AKI, patient has WBC 25k with procalcitonin 0.97.  Lactate 3.8.  D.dimer is >10.  CXR shows bilateral PNA on top of chronic interstitial lung disease.  RR remains in the 30s, and BP remains borderline low 103/62 at last check.  I spoke with Son-in-law over the phone, again verified family's wishes: goal of care is comfort, no major interventions nor work up to be done at this point.   Review of Systems: As per HPI, otherwise all review of systems negative.  Past Medical History:  Diagnosis Date   Aortic atherosclerosis ([place])    Noted on  CT   Arthritis    Colon polyps    adenomatous   History of fracture of tibia    History of tachycardia    patient unaware of this   Kyphosis of cervicothoracic region    SEVERE   Osteoporosis 01/2014   T -3.8 (01/2014)   Other constipation    chronic   Prostate cancer ([place]) 1996   surgery then lupron   Skin cancer, basal cell 2004   surgical resection.   Unspecified venous (peripheral) insufficiency    Vitamin B12 deficiency     Past Surgical History:  Procedure Laterality Date   BASAL CELL CARCINOMA EXCISION  2004   Left side of face   CATARACT EXTRACTION Bilateral 2000, 2003   both eyes   COLONOSCOPY     h/o adenomatous polyps   CYSTOSCOPY W/ RETROGRADES Bilateral 11/19/2016   Procedure: CYSTOSCOPY WITH RETROGRADE PYELOGRAM;  Surgeon: Hollice Espy, MD;  Location: [facility] ORS;  Service: Urology;  Laterality: Bilateral;  General vs. Spinal   CYSTOSCOPY W/ URETERAL STENT PLACEMENT Left 02/25/2017   Procedure: CYSTOSCOPY WITH STENT REPLACEMENT;  Surgeon: Hollice Espy, MD;  Location: [facility] ORS;  Service: Urology;  Laterality: Left;   CYSTOSCOPY WITH URETEROSCOPY AND STENT PLACEMENT Left 11/19/2016   Procedure: CYSTOSCOPY WITH URETEROSCOPY AND STENT PLACEMENT;  Surgeon: Hollice Espy, MD;  Location: [facility] ORS;  Service: Urology;  Laterality: Left;   ESOPHAGOGASTRODUODENOSCOPY N/A 10/31/2017   Procedure: ESOPHAGOGASTRODUODENOSCOPY (EGD) with biopsy and brushings;  Surgeon: Toledo, Benay Pike, MD;  Location: [facility] ENDOSCOPY;  Service: Gastroenterology;  Laterality: N/A;   FRACTURE SURGERY     JOINT REPLACEMENT  2005   Left knee   JOINT REPLACEMENT  12/13   Right knee   PROSTATE SURGERY  1996   cancer   sigmoid resection     TIBIA FRACTURE SURGERY  1999   URETERAL BIOPSY Left 11/19/2016   Procedure: URETERAL BIOPSY;  Surgeon: Hollice Espy, MD;  Location: [facility] ORS;  Service: Urology;  Laterality: Left;   VOLVULUS REDUCTION  12/10     reports that he  has never smoked. He has never used smokeless tobacco. He reports that he does not drink alcohol or use drugs.  Allergies  Allergen Reactions   Other     RAW ONIONS- SINUS REACTION    Family History  Problem Relation Age of Onset   Leukemia Mother    Diabetes Son    Prostate cancer Neg Hx    Bladder Cancer Neg Hx    Kidney cancer Neg Hx      Prior to Admission medications   Medication Sig Start Date End Date Taking? Authorizing Provider  abiraterone acetate (ZYTIGA) 250 MG tablet TAKE 1 TABLET (250 MG TOTAL) BY MOUTH DAILY. TAKE WITH A LOW FAT MEAL. 02/05/18   Cammie Sickle, MD  acetaminophen (TYLENOL) 325 MG tablet Take 325-650 mg by mouth every 4 (four) hours as needed for fever (general discomfort).     [provider]  aspirin EC 81 MG tablet Take 81 mg by mouth every other day.    [provider]  clindamycin (CLEOCIN) 150 MG capsule Take 4 capsules by mouth as needed. Prior to dental work 12/03/17   [provider]  cyanocobalamin (,VITAMIN B-12,) 1000 MCG/ML injection Inject 1,000 mcg into the muscle every 30 (thirty) days.    [provider]  denosumab (PROLIA) 60 MG/ML SOSY injection Inject 60 mg into the skin every 6 (six) months.    [provider]  furosemide (LASIX) 40 MG tablet Take 1 tablet by mouth daily.    [provider]  latanoprost (XALATAN) 0.005 % ophthalmic solution Place 1 drop into both eyes at bedtime.    [provider]  morphine (ROXANOL) 20 MG/ML concentrated solution Take 0.25-0.5 mLs (5-10 mg total) by mouth every 2 (two) hours as needed for shortness of breath. 10/26/18   Venia Carbon, MD  mupirocin ointment (BACTROBAN) 2 % Place 1 application into the nose daily.    [provider]  pantoprazole (PROTONIX) 40 MG tablet Take 1 tablet (40 mg total) by mouth daily. 01/14/18   Viviana Simpler I, MD  polyethylene glycol powder (GLYCOLAX/MIRALAX) powder FILL TO LINE (17  GRAMS ), MIX AND DRINK TWICE A DAY AND 8.5 GRAMS AT NOON 09/30/16   Viviana Simpler I, MD  sennosides-docusate sodium (SENOKOT-S) 8.6-50 MG tablet Take 2 tablets by mouth every evening.     [provider]  Skin Protectants, Misc. (EUCERIN) cream Apply 1 application topically 2 (two) times daily as needed for dry skin.    [provider]    Physical Exam: There were no vitals filed for this visit.  Constitutional: Awake, encephalopathic, not really speaking at this point. Eyes: PERRL, lids and conjunctivae normal ENMT: Mucous membranes are moist. Posterior pharynx clear of any exudate or lesions.Normal dentition.  Neck: normal, supple, no  masses, no thyromegaly Respiratory: Significantly increased WOB and short of breath. Cardiovascular: Regular rate and rhythm, no murmurs / rubs / gallops. No extremity edema. 2+ pedal pulses. No carotid bruits.  Abdomen: no tenderness, no masses palpated. No hepatosplenomegaly. Bowel sounds positive.  Musculoskeletal: no clubbing / cyanosis. No joint deformity upper and lower extremities. Good ROM, no contractures. Normal muscle tone.  Skin: no rashes, lesions, ulcers. No induration Neurologic: MAE, grossly non-focal Psychiatric: Awake, encephalopathic, not really speaking at this point.   Labs on Admission: I have personally reviewed following labs and imaging studies  CBC: Recent Labs  Lab 15-Nov-2018 0154  WBC 25.9*  NEUTROABS 23.1*  HGB 12.3*  HCT 39.9  MCV 90.3  PLT [id number]*   Basic Metabolic Panel: Recent Labs  Lab 2018-11-15 0154  NA 149*  K 3.6  CL 111  CO2 19*  GLUCOSE 331*  BUN 59*  CREATININE 2.53*  CALCIUM 8.8*   GFR: Estimated Creatinine Clearance: 18.6 mL/min (A) (by C-G formula based on SCr of 2.53 mg/dL (H)). Liver Function Tests: Recent Labs  Lab November 15, 2018 0154  AST 48*  ALT 37  ALKPHOS 91  BILITOT 0.8  PROT 7.6  ALBUMIN 3.0*   No results for input(s): LIPASE, AMYLASE in the last 168 hours. No  results for input(s): AMMONIA in the last 168 hours. Coagulation Profile: No results for input(s): INR, PROTIME in the last 168 hours. Cardiac Enzymes: No results for input(s): CKTOTAL, CKMB, CKMBINDEX, TROPONINI in the last 168 hours. BNP (last 3 results) No results for input(s): PROBNP in the last 8760 hours. HbA1C: No results for input(s): HGBA1C in the last 72 hours. CBG: No results for input(s): GLUCAP in the last 168 hours. Lipid Profile: Recent Labs    November 15, 2018 0155  TRIG 223*   Thyroid Function Tests: No results for input(s): TSH, T4TOTAL, FREET4, T3FREE, THYROIDAB in the last 72 hours. Anemia Panel: Recent Labs    11-15-2018 0154  FERRITIN 428*   Urine analysis:    Component Value Date/Time   COLORURINE YELLOW (A) 11/11/2016 1206   APPEARANCEUR Clear 10/02/2017 1317   LABSPEC 1.011 11/11/2016 1206   PHURINE 6.0 11/11/2016 1206   GLUCOSEU Negative 10/02/2017 1317   HGBUR NEGATIVE 11/11/2016 1206   BILIRUBINUR Negative 10/02/2017 1317   KETONESUR NEGATIVE 11/11/2016 1206   PROTEINUR Negative 10/02/2017 1317   PROTEINUR NEGATIVE 11/11/2016 1206   NITRITE Negative 10/02/2017 1317   NITRITE NEGATIVE 11/11/2016 1206   LEUKOCYTESUR Negative 10/02/2017 1317    Radiological Exams on Admission: Dg Chest Port 1 View  Result Date: 2018/11/15 CLINICAL DATA:  Respiratory distress EXAM: PORTABLE CHEST 1 VIEW COMPARISON:  05/01/2018 FINDINGS: Heart is upper limits normal in size. Mild hyperinflation. Patchy interstitial and airspace opacities throughout the lungs. This is concerning for infiltrate superimposed on chronic interstitial lung disease. No visible effusions or acute bony abnormality. IMPRESSION: Worsening patchy bilateral interstitial and alveolar opacities concerning or possible pneumonia superimposed on chronic interstitial lung disease. Electronically Signed   By: Rolm Baptise M.D.   On: 11/15/18 02:19    EKG: Independently  reviewed.  Assessment/Plan Principal Problem:   Admission for end of life care Active Problems:   COVID-19 virus infection   Acute respiratory failure with hypoxia (HCC)   AKI (acute kidney injury) ([place])    1. Admission for end of life care - 1. This is a 85 yo M with COVID-19 and hypoxic respiratory failure, patient also has AKI, acute metabolic encephalopathy, suspected superimposed bacterial pneumonia, and I  suspect a large PE as well given the hypotension and off the chart high D.Dimer. 2. Spoke with family on phone, they again confirmed to me what they had told Dr. Silvio Pate previously: goals of care are patient comfort, they do not want any major interventions nor major medical work up at this point. 3. Agree that this is most reasonable for this very elderly gentleman who is doing very poorly with COVID-19. 4. End of life care pathway 5. Will continue morphine gtt started at [facility], titrate and bolus PRN to patient comfort. 6. Ativan PRN 7. Robinul, halidol, zofran PRN 8. Avoid needle sticks, lab draws, and tests 9. O2 via NRB for comfort 10. Patient does have one more family member who wants to come in and visit sometime later today if patient makes it that far, but family says they understand if he does not.  DVT prophylaxis: None, comfort measures only Code Status: DNR/DNI goals of care are patient comfort Family Communication: Spoke with Son-in-law Mr. Maudry Mayhew Disposition Plan: Admission for end of life care Consults called: None Admission status: Admit to inpatient  Severity of Illness: The appropriate patient status for this patient is INPATIENT. Inpatient status is judged to be reasonable and necessary in order to provide the required intensity of service to ensure the patient's safety. The patient's presenting symptoms, physical exam findings, and initial radiographic and laboratory data in the context of their chronic comorbidities is felt to place them at high risk for  further clinical deterioration. Furthermore, it is not anticipated that the patient will be medically stable for discharge from the hospital within 2 midnights of admission. The following factors support the patient status of inpatient.   IP status for end of life care with morphine gtt.   * I certify that at the point of admission it is my clinical judgment that the patient will require inpatient hospital care spanning beyond 2 midnights from the point of admission due to high intensity of service, high risk for further deterioration and high frequency of surveillance required.*    Ivana Nicastro M. DO [facility]  How to contact the [facility] Attending or Consulting provider [place] or covering provider during after hours [place], for this patient?  1. Check the care team in [facility] and look for a) attending/consulting TR[facility] provider listed and b) the [facility] team listed 2. Log into [email or web address]  Amion Physician Scheduling and messaging for groups and whole hospitals  On call and physician scheduling software for group practices, residents, hospitalists and other medical providers for call, clinic, rotation and shift schedules. OnCall Enterprise is a hospital-wide system for scheduling doctors and paging doctors on call. EasyPlot is for scientific plotting and data analysis.  [email or web address]  and use [place]'s universal password to access. If you do not have the password, please contact the hospital operator.  3. Locate the [facility] provider you are looking for under [facility] and page to a number that you can be directly reached. 4. If you still have difficulty reaching the provider, please page the [facility] (Director on Call) for the Hospitalists listed on amion for assistance.  2018/11/17, 5:02 AM

## 2018-11-25 NOTE — ED Notes (Signed)
As per prior nurse covering patient on the way to [place] from nursing home, covid positive, bought to [place] due to patient being unstable. Resp at bedside patient placed on bipap not tolerating well, sating 95% and working to breath, using abd muscles. 3 IV;s placed, labs, cultures x 2 and lactate sent. Portable cray completed. Awaiting plan of care.

## 2018-11-25 NOTE — Death Summary Note (Signed)
DEATH SUMMARY   Patient Details  Name: Benjamin Villarreal MRN: [id number] DOB: [birth year]  Admission/Discharge Information   Admit Date:  2018/11/13  Date of Death: Date of Death: Nov 13, 2018  Time of Death: Time of Death: 06/03/2022  Length of Stay: 1  Referring Physician: Venia Carbon, MD   Reason(s) for Hospitalization  Actively dying from covid-19 pneumonia  Diagnoses  Preliminary cause of death: Covid-19 pneumonia Secondary Diagnoses (including complications and co-morbidities):  Principal Problem:   Admission for end of life care Active Problems:   COVID-19 virus infection   Acute respiratory failure with hypoxia ([place])   AKI (acute kidney injury) ([place])   Acute metabolic encephalopathy   Brief Hospital Course (including significant findings, care, treatment, and services provided and events leading to death)  Zacharay Dragone is a 85 y.o. year old male who presented in respiratory distress due to progressive covid-19 pneumonia. Having recently transferred into hospice care at ALF, discussions with family and PCP as well as EDP at [facility] and admitting MD have consistently confirmed DNR status and desire for comfort measures only. Aggressive interventions were offered and when declined, morphine infusion was started, family allowed to visit, and the patient kept comfortable until dying in the evening on 2022-11-13.   Pertinent Labs and Studies  Significant Diagnostic Studies Dg Chest Port 1 View  Result Date: 11-13-2018 CLINICAL DATA:  Respiratory distress EXAM: PORTABLE CHEST 1 VIEW COMPARISON:  05/01/2018 FINDINGS: Heart is upper limits normal in size. Mild hyperinflation. Patchy interstitial and airspace opacities throughout the lungs. This is concerning for infiltrate superimposed on chronic interstitial lung disease. No visible effusions or acute bony abnormality. IMPRESSION: Worsening patchy bilateral interstitial and alveolar opacities concerning or possible pneumonia superimposed on chronic  interstitial lung disease. Electronically Signed   By: Rolm Baptise M.D.   On: 2018/11/13 02:19    Microbiology Recent Results (from the past 240 hour(s))  SARS Coronavirus 2 [facility] order, Performed in [facility] hospital lab) Nasopharyngeal Nasopharyngeal Swab     Status: Abnormal   Collection Time: 2018-11-13  1:37 AM   Specimen: Nasopharyngeal Swab  Result Value Ref Range Status   SARS Coronavirus 2 POSITIVE (A) NEGATIVE Final    Comment: RESULT CALLED TO, READ BACK BY AND VERIFIED WITH: JEANETTE PEREZ @248  2018/11/13 (NOTE) If result is NEGATIVE SARS-CoV-2 target nucleic acids are NOT DETECTED. The SARS-CoV-2 RNA is generally detectable in upper and lower  respiratory specimens during the acute phase of infection. The lowest  concentration of SARS-CoV-2 viral copies this assay can detect is 250  copies / mL. A negative result does not preclude SARS-CoV-2 infection  and should not be used as the sole basis for treatment or other  patient management decisions.  A negative result may occur with  improper specimen collection / handling, submission of specimen other  than nasopharyngeal swab, presence of viral mutation(s) within the  areas targeted by this assay, and inadequate number of viral copies  (<250 copies / mL). A negative result must be combined with clinical  observations, patient history, and epidemiological information. If result is POSITIVE SARS-CoV-2 target nucleic acids are DETECTED. The SARS -CoV-2 RNA is generally detectable in upper and lower  respiratory specimens during the acute phase of infection.  Positive  results are indicative of active infection with SARS-CoV-2.  Clinical  correlation with patient history and other diagnostic information is  necessary to determine patient infection status.  Positive results do  not rule out bacterial infection or co-infection with other viruses.  If result is PRESUMPTIVE POSTIVE SARS-CoV-2 nucleic acids MAY BE PRESENT.    A presumptive positive result was obtained on the submitted specimen  and confirmed on repeat testing.  While 2019 novel coronavirus  (SARS-CoV-2) nucleic acids may be present in the submitted sample  additional confirmatory testing may be necessary for epidemiological  and / or clinical management purposes  to differentiate between  SARS-CoV-2 and other Sarbecovirus currently known to infect humans.  If clinically indicated additional testing with an alternate test  methodology [phone]) is advise d. The SARS-CoV-2 RNA is generally  detectable in upper and lower respiratory specimens during the acute  phase of infection. The expected result is Negative. Fact Sheet for Patients:  StrictlyIdeas.no Fact Sheet for Healthcare Providers: BankingDealers.co.za This test is not yet approved or cleared by the [place] FDA and has been authorized for detection and/or diagnosis of SARS-CoV-2 by FDA under an Emergency Use Authorization (EUA).  This EUA will remain in effect (meaning this test can be used) for the duration of the COVID-19 declaration under Section 564(b)(1) of the Act, 21 U.S.C. section 360bbb-3(b)(1), unless the authorization is terminated or revoked sooner. Performed at [facility], [address] [place], [place] [address]   Blood Culture (routine x 2)     Status: None (Preliminary result)   Collection Time: 2018/11/28  1:54 AM   Specimen: BLOOD  Result Value Ref Range Status   Specimen Description BLOOD RIGHT ARM  Final   Special Requests   Final    BOTTLES DRAWN AEROBIC AND ANAEROBIC Blood Culture results may not be optimal due to an inadequate volume of blood received in culture bottles   Culture   Final    NO GROWTH < 12 HOURS Performed at [facility], [address], [place], [place] [address]    Report Status PENDING  Incomplete  Blood Culture (routine x 2)     Status: None (Preliminary result)    Collection Time: November 28, 2018  1:55 AM   Specimen: BLOOD  Result Value Ref Range Status   Specimen Description BLOOD LEFT HAND  Final   Special Requests   Final    BOTTLES DRAWN AEROBIC AND ANAEROBIC Blood Culture adequate volume   Culture   Final    NO GROWTH < 12 HOURS Performed at [facility], [address] [place], [place] [address]    Report Status PENDING  Incomplete    Lab Basic Metabolic Panel: Recent Labs  Lab 28-Nov-2018 0154  NA 149*  K 3.6  CL 111  CO2 19*  GLUCOSE 331*  BUN 59*  CREATININE 2.53*  CALCIUM 8.8*   Liver Function Tests: Recent Labs  Lab 11/28/2018 0154  AST 48*  ALT 37  ALKPHOS 91  BILITOT 0.8  PROT 7.6  ALBUMIN 3.0*   CBC: Recent Labs  Lab 11/28/2018 0154  WBC 25.9*  NEUTROABS 23.1*  HGB 12.3*  HCT 39.9  MCV 90.3  PLT 134*   Sepsis Labs: Recent Labs  Lab 11/28/2018 0154  PROCALCITON 0.97  WBC 25.9*  LATICACIDVEN 3.8*    Procedures/Operations  None   Patrecia Pour 10/31/2018, 7:13 AM

## 2018-11-25 NOTE — ED Provider Notes (Signed)
[facility] Emergency Department Provider Note    First MD Initiated Contact with Patient 11/16/2018 [phone]     (approximate)  I have reviewed the triage vital signs and the nursing notes.  Level 5 caveat: History review of systems limited secondary to altered mental status/respiratory distress HISTORY  Chief Complaint Shortness of Breath, Respiratory Distress, and COVID +    HPI Benjamin Villarreal is a 85 y.o. male with below listed previous medical conditions presents to the emergency department via EMS secondary to respiratory distress.  Per EMS the patient is COVID positive from [facility] facility and was accepted at [facility] however secondary to patient's vital signs EMS felt as though they were not going to be able to make it there.  EMS had no further information regarding the patient.        Past Medical History:  Diagnosis Date  . Aortic atherosclerosis ([place])    Noted on CT  . Arthritis   . Colon polyps    adenomatous  . History of fracture of tibia   . History of tachycardia    patient unaware of this  . Kyphosis of cervicothoracic region    SEVERE  . Osteoporosis 01/2014   T -3.8 (01/2014)  . Other constipation    chronic  . Prostate cancer [facility]) 1996   surgery then lupron  . Skin cancer, basal cell 2004   surgical resection.  Marland Kitchen Unspecified venous (peripheral) insufficiency   . Vitamin B12 deficiency     Patient Active Problem List   Diagnosis Date Noted  . COVID-19 virus infection 10/22/2018  . Esophageal stricture 10/31/2017  . Primary prostate cancer with metastasis from prostate to other site [facility]) 07/23/2017  . Stasis dermatitis 07/23/2017  . Aortic atherosclerosis ([place]) 10/09/2016  . Peripheral neuropathy 01/24/2016  . 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 Allergic rhinitis due to pollen 10/21/2012  . Prostate cancer ([place]) 03/22/2009  . Chronic venous insufficiency 09/07/2008  . CONSTIPATION, CHRONIC 09/07/2008  .  Osteoarthritis, multiple sites 09/05/2008  . Osteoporosis 09/05/2008    Past Surgical History:  Procedure Laterality Date  . BASAL CELL CARCINOMA EXCISION  2004   Left side of face  . CATARACT EXTRACTION Bilateral 2000, 2003   both eyes  . COLONOSCOPY     h/o adenomatous polyps  . CYSTOSCOPY W/ RETROGRADES Bilateral 11/19/2016   Procedure: CYSTOSCOPY WITH RETROGRADE PYELOGRAM;  Surgeon: Hollice Espy, MD;  Location: [facility] ORS;  Service: Urology;  Laterality: Bilateral;  General vs. Spinal  . CYSTOSCOPY W/ URETERAL STENT PLACEMENT Left 02/25/2017   Procedure: CYSTOSCOPY WITH STENT REPLACEMENT;  Surgeon: Hollice Espy, MD;  Location: [facility] ORS;  Service: Urology;  Laterality: Left;  . CYSTOSCOPY WITH URETEROSCOPY AND STENT PLACEMENT Left 11/19/2016   Procedure: CYSTOSCOPY WITH URETEROSCOPY AND STENT PLACEMENT;  Surgeon: Hollice Espy, MD;  Location: [facility] ORS;  Service: Urology;  Laterality: Left;  . ESOPHAGOGASTRODUODENOSCOPY N/A 10/31/2017   Procedure: ESOPHAGOGASTRODUODENOSCOPY (EGD) with biopsy and brushings;  Surgeon: Toledo, Benay Pike, MD;  Location: [facility] ENDOSCOPY;  Service: Gastroenterology;  Laterality: N/A;  . FRACTURE SURGERY    . JOINT REPLACEMENT  2005   Left knee  . JOINT REPLACEMENT  12/13   Right knee  . PROSTATE SURGERY  1996   cancer  . sigmoid resection    . TIBIA FRACTURE SURGERY  1999  . URETERAL BIOPSY Left 11/19/2016   Procedure: URETERAL BIOPSY;  Surgeon: Hollice Espy, MD;  Location: [facility] ORS;  Service: Urology;  Laterality:  Left;  . VOLVULUS REDUCTION  12/10    Prior to Admission medications   Medication Sig Start Date End Date Taking? Authorizing Provider  abiraterone acetate (ZYTIGA) 250 MG tablet TAKE 1 TABLET (250 MG TOTAL) BY MOUTH DAILY. TAKE WITH A LOW FAT MEAL. 02/05/18   Cammie Sickle, MD  acetaminophen (TYLENOL) 325 MG tablet Take 325-650 mg by mouth every 4 (four) hours as needed for fever (general discomfort).     [provider]   aspirin EC 81 MG tablet Take 81 mg by mouth every other day.    [provider]  clindamycin (CLEOCIN) 150 MG capsule Take 4 capsules by mouth as needed. Prior to dental work 12/03/17   [provider]  cyanocobalamin (,VITAMIN B-12,) 1000 MCG/ML injection Inject 1,000 mcg into the muscle every 30 (thirty) days.    [provider]  denosumab (PROLIA) 60 MG/ML SOSY injection Inject 60 mg into the skin every 6 (six) months.    [provider]  furosemide (LASIX) 40 MG tablet Take 1 tablet by mouth daily.    [provider]  latanoprost (XALATAN) 0.005 % ophthalmic solution Place 1 drop into both eyes at bedtime.    [provider]  morphine (ROXANOL) 20 MG/ML concentrated solution Take 0.25-0.5 mLs (5-10 mg total) by mouth every 2 (two) hours as needed for shortness of breath. 10/26/18   Venia Carbon, MD  mupirocin ointment (BACTROBAN) 2 % Place 1 application into the nose daily.    [provider]  pantoprazole (PROTONIX) 40 MG tablet Take 1 tablet (40 mg total) by mouth daily. 01/14/18   Viviana Simpler I, MD  polyethylene glycol powder (GLYCOLAX/MIRALAX) powder FILL TO LINE (17 GRAMS ), MIX AND DRINK TWICE A DAY AND 8.5 GRAMS AT NOON 09/30/16   Viviana Simpler I, MD  sennosides-docusate sodium (SENOKOT-S) 8.6-50 MG tablet Take 2 tablets by mouth every evening.     [provider]  Skin Protectants, Misc. (EUCERIN) cream Apply 1 application topically 2 (two) times daily as needed for dry skin.    [provider]    Allergies Other  Family History  Problem Relation Age of Onset  . Leukemia Mother   . Diabetes Son   . Prostate cancer Neg Hx   . Bladder Cancer Neg Hx   . Kidney cancer Neg Hx     Social History Social History   Tobacco Use  . Smoking status: Never Smoker  . Smokeless tobacco: Never Used  Substance Use Topics  . Alcohol use: No    Alcohol/week: 0.0 - 1.0 standard drinks    Frequency:  Never    Comment: in past  . Drug use: No    Review of Systems Constitutional: No fever/chills Eyes: No visual changes. ENT: No sore throat. Cardiovascular: Denies chest pain. Respiratory: Positive for respiratory distress Gastrointestinal: No abdominal pain.  No nausea, no vomiting.  No diarrhea.  No constipation. Genitourinary: Negative for dysuria. Musculoskeletal: Negative for neck pain.  Negative for back pain. Integumentary: Negative for rash. Neurological: Negative for headaches, focal weakness or numbness.  ____________________________________________   PHYSICAL EXAM:  VITAL SIGNS: ED Triage Vitals  Enc Vitals Group     BP 2018/11/27 0123 (!) 168/153     Pulse Rate 27-Nov-2018 0123 (!) 111     Resp 27-Nov-2018 0123 (!) 31     Temp 11/27/2018 0123 98.8 F (37.1 C)     Temp Source 11/27/18 0123 Axillary     SpO2 Nov 27, 2018  0123 99 %     Weight Nov 10, 2018 0126 77.1 kg (170 lb)     Height 11-10-2018 0126 1.829 m (6')     Head Circumference --      Peak Flow --      Pain Score 11/10/2018 0218 0     Pain Loc --      Pain Edu? --      Excl. in [place]? --     Constitutional: Alert   Apparent respiratory distress Eyes: Conjunctivae are normal.  Mouth/Throat: Mucous membranes are moist. Neck: No stridor.  No meningeal signs.   Cardiovascular: Normal rate, regular rhythm. Good peripheral circulation. Grossly normal heart sounds. Respiratory: Normal respiratory effort.  No retractions. Gastrointestinal: Soft and nontender. No distention.  Musculoskeletal: No lower extremity tenderness nor edema. No gross deformities of extremities. Neurologic:  Normal speech and language. No gross focal neurologic deficits are appreciated.  Skin:  Skin is warm, dry and intact. Psychiatric: Mood and affect are normal. Speech and behavior are normal.  ____________________________________________   LABS (all labs ordered are listed, but only abnormal results are displayed)  Labs Reviewed  SARS  CORONAVIRUS 2 (HOSPITAL ORDER, [place] [facility]) - Abnormal; Notable for the following components:      Result Value   SARS Coronavirus 2 POSITIVE (*)    All other components within normal limits  LACTIC ACID, PLASMA - Abnormal; Notable for the following components:   Lactic Acid, Venous 3.8 (*)    All other components within normal limits  CBC WITH DIFFERENTIAL/PLATELET - Abnormal; Notable for the following components:   WBC 25.9 (*)    Hemoglobin 12.3 (*)    RDW 16.1 (*)    Platelets 134 (*)    nRBC 0.8 (*)    Neutro Abs 23.1 (*)    Abs Immature Granulocytes 0.37 (*)    All other components within normal limits  COMPREHENSIVE METABOLIC PANEL - Abnormal; Notable for the following components:   Sodium 149 (*)    CO2 19 (*)    Glucose, Bld 331 (*)    BUN 59 (*)    Creatinine, Ser 2.53 (*)    Calcium 8.8 (*)    Albumin 3.0 (*)    AST 48 (*)    GFR calc non Af Amer 21 (*)    GFR calc Af Amer 24 (*)    Anion gap 19 (*)    All other components within normal limits  LACTATE DEHYDROGENASE - Abnormal; Notable for the following components:   LDH 561 (*)    All other components within normal limits  FERRITIN - Abnormal; Notable for the following components:   Ferritin 428 (*)    All other components within normal limits  TRIGLYCERIDES - Abnormal; Notable for the following components:   Triglycerides 223 (*)    All other components within normal limits  CULTURE, BLOOD (ROUTINE X 2)  CULTURE, BLOOD (ROUTINE X 2)  PROCALCITONIN  FIBRINOGEN  LACTIC ACID, PLASMA  FIBRIN DERIVATIVES D-DIMER ([facility] ONLY)  C-REACTIVE PROTEIN    ____________________________________________  RADIOLOGY I, [place] [facility] Kayelee Herbig, personally viewed and evaluated these images (plain radiographs) as part of my medical decision making, as well as reviewing the written report by the radiologist.  ED MD interpretation: Chest x-ray revealed worsening patchy bilateral interstitial and alveolar  opacities concerning for possible pneumonia per radiologist on chest x-ray interpretation.  Official radiology report(s): Dg Chest Port 1 View  Result Date: 11/10/18 CLINICAL DATA:  Respiratory distress EXAM:  PORTABLE CHEST 1 VIEW COMPARISON:  05/01/2018 FINDINGS: Heart is upper limits normal in size. Mild hyperinflation. Patchy interstitial and airspace opacities throughout the lungs. This is concerning for infiltrate superimposed on chronic interstitial lung disease. No visible effusions or acute bony abnormality. IMPRESSION: Worsening patchy bilateral interstitial and alveolar opacities concerning or possible pneumonia superimposed on chronic interstitial lung disease. Electronically Signed   By: Rolm Baptise M.D.   On: 22-Nov-2018 02:19    ____________________________________________   PROCEDURES     .Critical Care Performed by: Gregor Hams, MD Authorized by: Gregor Hams, MD   Critical care provider statement:    Critical care time (minutes):  45   Critical care time was exclusive of:  Separately billable procedures and treating other patients (COVID-19 infection)   Critical care was necessary to treat or prevent imminent or life-threatening deterioration of the following conditions:  Respiratory failure   Critical care was time spent personally by me on the following activities:  Development of treatment plan with patient or surrogate, discussions with consultants, evaluation of patient's response to treatment, examination of patient, obtaining history from patient or surrogate, ordering and performing treatments and interventions, ordering and review of laboratory studies, ordering and review of radiographic studies, pulse oximetry, re-evaluation of patient's condition and review of old charts     ____________________________________________   INITIAL IMPRESSION / MDM / [place] / ED COURSE  As part of my medical decision making, I reviewed the following  data within the electronic MEDICAL RECORD NUMBER   85 year old male presenting to the emergency department secondary to Rester distress with known COVID-19 infection I was in route to [facility] when EMS change course secondary to worsening clinical status.  Patient noted to be markedly hypoxic on arrival with O2 saturation of 47% however pulse ox was not on the patient's fingernail it was actually wrapped around the patient's proximal finger.  Nonrebreather was applied patient's current oxygen saturation between 97 200% patient hypotensive systolic blood pressure 87.  Patient also tachypneic with a respiratory rate in the 30s.  Patient discussed with Dr. Karn Cassis who was aware of the patient and informed me that the plan was for comfort care.  Dr. Alcario Drought advised that he was going to initiate a morphine infusion.  Patient's family also presented to the emergency department and concurred what Dr. Alcario Drought said in regards to comfort measures only.  Morphine infusion was initiated patient currently on a nonrebreather.  [facility] arrived and transported the patient to [facility].  ____________________________________________  FINAL CLINICAL IMPRESSION(S) / ED DIAGNOSES  Final diagnoses:  [id number] virus infection     MEDICATIONS GIVEN DURING THIS VISIT:  Medications  morphine 100mg  in NS 149mL (1mg /mL) infusion - premix (5 mg/hr Intravenous New Bag/Given 2018-11-22 0318)  sodium chloride 0.9 % bolus 1,000 mL (1,000 mLs Intravenous New Bag/Given 2018/11/22 [id number])     ED Discharge Orders    None      *Please note:  Benjamin Villarreal was evaluated in Emergency Department on 11-22-18 for the symptoms described in the history of present illness. He was evaluated in the context of the global COVID-19 pandemic, which necessitated consideration that the patient might be at risk for infection with the SARS-CoV-2 virus that causes COVID-19. Institutional protocols and algorithms that pertain to the evaluation  of patients at risk for COVID-19 are in a state of rapid change based on information released by regulatory bodies including the CDC and federal and state organizations. These policies  and algorithms were followed during the patient's care in the ED.  Some ED evaluations and interventions may be delayed as a result of limited staffing during the pandemic.*  Note:  This document was prepared using Dragon voice recognition software and may include unintentional dictation errors.   Gregor Hams, MD Nov 08, 2018 [phone]

## 2018-11-25 NOTE — Plan of Care (Signed)
Pt is on complete comfort care

## 2018-11-25 NOTE — ED Notes (Signed)
ED TO INPATIENT HANDOFF REPORT  ED Nurse Name and Phone #: Antonieta Pert, rn  S Name/Age/Gender Benjamin Villarreal 85 y.o. male Room/Bed: ED06A/ED06A  Code Status   Code Status: Prior  Home/SNF/Other Nursing Home Patient oriented to: 0 Is this baseline? Yes   Triage Complete: Triage complete  Chief Complaint covid  Triage Note Patient arrived unconscious and breathing heavily. Per EMS patient was to go to [facility] but was brought here due to being unstable for transport.   Allergies Allergies  Allergen Reactions  . Other     RAW ONIONS- SINUS REACTION    Level of Care/Admitting Diagnosis ED Disposition    ED Disposition Condition Comment   Transfer to Another Facility  The patient appears reasonably stabilized for transfer considering the current resources, flow, and capabilities available in the ED at this time, and I doubt any other [facility] requiring further screening and/or treatment in the ED prior to transfer is p resent.       B Medical/Surgery History Past Medical History:  Diagnosis Date  . Aortic atherosclerosis ([place])    Noted on CT  . Arthritis   . Colon polyps    adenomatous  . History of fracture of tibia   . History of tachycardia    patient unaware of this  . Kyphosis of cervicothoracic region    SEVERE  . Osteoporosis 01/2014   T -3.8 (01/2014)  . Other constipation    chronic  . Prostate cancer [facility]) 1996   surgery then lupron  . Skin cancer, basal cell 2004   surgical resection.  Marland Kitchen Unspecified venous (peripheral) insufficiency   . Vitamin B12 deficiency    Past Surgical History:  Procedure Laterality Date  . BASAL CELL CARCINOMA EXCISION  2004   Left side of face  . CATARACT EXTRACTION Bilateral 2000, 2003   both eyes  . COLONOSCOPY     h/o adenomatous polyps  . CYSTOSCOPY W/ RETROGRADES Bilateral 11/19/2016   Procedure: CYSTOSCOPY WITH RETROGRADE PYELOGRAM;  Surgeon: Hollice Espy, MD;  Location: [facility] ORS;  Service:  Urology;  Laterality: Bilateral;  General vs. Spinal  . CYSTOSCOPY W/ URETERAL STENT PLACEMENT Left 02/25/2017   Procedure: CYSTOSCOPY WITH STENT REPLACEMENT;  Surgeon: Hollice Espy, MD;  Location: [facility] ORS;  Service: Urology;  Laterality: Left;  . CYSTOSCOPY WITH URETEROSCOPY AND STENT PLACEMENT Left 11/19/2016   Procedure: CYSTOSCOPY WITH URETEROSCOPY AND STENT PLACEMENT;  Surgeon: Hollice Espy, MD;  Location: [facility] ORS;  Service: Urology;  Laterality: Left;  . ESOPHAGOGASTRODUODENOSCOPY N/A 10/31/2017   Procedure: ESOPHAGOGASTRODUODENOSCOPY (EGD) with biopsy and brushings;  Surgeon: Toledo, Benay Pike, MD;  Location: [facility] ENDOSCOPY;  Service: Gastroenterology;  Laterality: N/A;  . FRACTURE SURGERY    . JOINT REPLACEMENT  2005   Left knee  . JOINT REPLACEMENT  12/13   Right knee  . PROSTATE SURGERY  1996   cancer  . sigmoid resection    . TIBIA FRACTURE SURGERY  1999  . URETERAL BIOPSY Left 11/19/2016   Procedure: URETERAL BIOPSY;  Surgeon: Hollice Espy, MD;  Location: [facility] ORS;  Service: Urology;  Laterality: Left;  Marland Kitchen VOLVULUS REDUCTION  12/10     A IV Location/Drains/Wounds Patient Lines/Drains/Airways Status   Active Line/Drains/Airways    Name:   Placement date:   Placement time:   Site:   Days:   Peripheral IV 2018/11/10 Left Wrist   2018-11-10    0134    Wrist   less than 1   Peripheral IV 11/10/18  Right Forearm   October 31, 2018    0135    Forearm   less than 1   Peripheral IV 10-31-18 Right Antecubital   October 31, 2018    0135    Antecubital   less than 1          Intake/Output Last 24 hours No intake or output data in the 24 hours ending 10-31-2018 0319  Labs/Imaging Results for orders placed or performed during the hospital encounter of Oct 31, 2018 (from the past 48 hour(s))  SARS Coronavirus 2 [facility] order, Performed in [facility] hospital lab) Nasopharyngeal Nasopharyngeal Swab     Status: Abnormal   Collection Time: 10-31-18  1:37 AM   Specimen: Nasopharyngeal Swab  Result  Value Ref Range   SARS Coronavirus 2 POSITIVE (A) NEGATIVE    Comment: RESULT CALLED TO, READ BACK BY AND VERIFIED WITH: JEANETTE PEREZ @248  10/31/2018 (NOTE) If result is NEGATIVE SARS-CoV-2 target nucleic acids are NOT DETECTED. The SARS-CoV-2 RNA is generally detectable in upper and lower  respiratory specimens during the acute phase of infection. The lowest  concentration of SARS-CoV-2 viral copies this assay can detect is 250  copies / mL. A negative result does not preclude SARS-CoV-2 infection  and should not be used as the sole basis for treatment or other  patient management decisions.  A negative result may occur with  improper specimen collection / handling, submission of specimen other  than nasopharyngeal swab, presence of viral mutation(s) within the  areas targeted by this assay, and inadequate number of viral copies  (<250 copies / mL). A negative result must be combined with clinical  observations, patient history, and epidemiological information. If result is POSITIVE SARS-CoV-2 target nucleic acids are DETECTED. The SARS -CoV-2 RNA is generally detectable in upper and lower  respiratory specimens during the acute phase of infection.  Positive  results are indicative of active infection with SARS-CoV-2.  Clinical  correlation with patient history and other diagnostic information is  necessary to determine patient infection status.  Positive results do  not rule out bacterial infection or co-infection with other viruses. If result is PRESUMPTIVE POSTIVE SARS-CoV-2 nucleic acids MAY BE PRESENT.   A presumptive positive result was obtained on the submitted specimen  and confirmed on repeat testing.  While 2019 novel coronavirus  (SARS-CoV-2) nucleic acids may be present in the submitted sample  additional confirmatory testing may be necessary for epidemiological  and / or clinical management purposes  to differentiate between  SARS-CoV-2 and other Sarbecovirus currently  known to infect humans.  If clinically indicated additional testing with an alternate test  methodology [phone]) is advise d. The SARS-CoV-2 RNA is generally  detectable in upper and lower respiratory specimens during the acute  phase of infection. The expected result is Negative. Fact Sheet for Patients:  StrictlyIdeas.no Fact Sheet for Healthcare Providers: BankingDealers.co.za This test is not yet approved or cleared by the [place] FDA and has been authorized for detection and/or diagnosis of SARS-CoV-2 by FDA under an Emergency Use Authorization (EUA).  This EUA will remain in effect (meaning this test can be used) for the duration of the COVID-19 declaration under Section 564(b)(1) of the Act, 21 U.S.C. section 360bbb-3(b)(1), unless the authorization is terminated or revoked sooner. Performed at [facility], [address] [place], [place] [address]   Lactic acid, plasma     Status: Abnormal   Collection Time: 2018-10-31  1:54 AM  Result Value Ref Range   Lactic Acid, Venous 3.8 (HH)  0.5 - 1.9 mmol/L    Comment: CRITICAL RESULT CALLED TO, READ BACK BY AND VERIFIED WITH JEANNETTE PEREZ AT 0301 ON November 28, 2018 RWW Performed at [facility], [address] [place], [place] [address]   CBC WITH DIFFERENTIAL     Status: Abnormal   Collection Time: 11/28/18  1:54 AM  Result Value Ref Range   WBC 25.9 (H) 4.0 - 10.5 K/uL   RBC 4.42 4.22 - 5.81 MIL/uL   Hemoglobin 12.3 (L) 13.0 - 17.0 g/dL   HCT 39.9 39.0 - 52.0 %   MCV 90.3 80.0 - 100.0 fL   MCH 27.8 26.0 - 34.0 pg   MCHC 30.8 30.0 - 36.0 g/dL   RDW 16.1 (H) 11.5 - 15.5 %   Platelets 134 (L) 150 - 400 K/uL   nRBC 0.8 (H) 0.0 - 0.2 %   Neutrophils Relative % 90 %   Neutro Abs 23.1 (H) 1.7 - 7.7 K/uL   Lymphocytes Relative 5 %   Lymphs Abs 1.3 0.7 - 4.0 K/uL   Monocytes Relative 4 %   Monocytes Absolute 1.0 0.1 - 1.0 K/uL   Eosinophils Relative 0 %    Eosinophils Absolute 0.0 0.0 - 0.5 K/uL   Basophils Relative 0 %   Basophils Absolute 0.1 0.0 - 0.1 K/uL   Immature Granulocytes 1 %   Abs Immature Granulocytes 0.37 (H) 0.00 - 0.07 K/uL   Polychromasia PRESENT     Comment: Performed at [facility], [address] [place], [place] [address]  Comprehensive metabolic panel     Status: Abnormal   Collection Time: 28-Nov-2018  1:54 AM  Result Value Ref Range   Sodium 149 (H) 135 - 145 mmol/L   Potassium 3.6 3.5 - 5.1 mmol/L   Chloride 111 98 - 111 mmol/L   CO2 19 (L) 22 - 32 mmol/L   Glucose, Bld 331 (H) 70 - 99 mg/dL   BUN 59 (H) 8 - 23 mg/dL   Creatinine, Ser 2.53 (H) 0.61 - 1.24 mg/dL   Calcium 8.8 (L) 8.9 - 10.3 mg/dL   Total Protein 7.6 6.5 - 8.1 g/dL   Albumin 3.0 (L) 3.5 - 5.0 g/dL   AST 48 (H) 15 - 41 U/L   ALT 37 0 - 44 U/L   Alkaline Phosphatase 91 38 - 126 U/L   Total Bilirubin 0.8 0.3 - 1.2 mg/dL   GFR calc non Af Amer 21 (L) >60 mL/min   GFR calc Af Amer 24 (L) >60 mL/min   Anion gap 19 (H) 5 - 15    Comment: Performed at [facility], [address] [place], [place] [address]  Lactate dehydrogenase     Status: Abnormal   Collection Time: November 28, 2018  1:54 AM  Result Value Ref Range   LDH 561 (H) 98 - 192 U/L    Comment: Performed at [facility], [address] [place], [place] [address]  Ferritin     Status: Abnormal   Collection Time: 2018-11-28  1:54 AM  Result Value Ref Range   Ferritin 428 (H) 24 - 336 ng/mL    Comment: Performed at [facility], [address] [place], [place] [address]  Fibrinogen     Status: None   Collection Time: 11/28/2018  1:54 AM  Result Value Ref Range   Fibrinogen 228 210 - 475 mg/dL    Comment: Performed at [facility], [address], [place],  [address]   Dg Chest [place] 1  View  Result Date: November 10, 2018 CLINICAL DATA:  Respiratory distress EXAM: PORTABLE CHEST 1 VIEW COMPARISON:  05/01/2018 FINDINGS: Heart is upper limits normal  in size. Mild hyperinflation. Patchy interstitial and airspace opacities throughout the lungs. This is concerning for infiltrate superimposed on chronic interstitial lung disease. No visible effusions or acute bony abnormality. IMPRESSION: Worsening patchy bilateral interstitial and alveolar opacities concerning or possible pneumonia superimposed on chronic interstitial lung disease. Electronically Signed   By: Rolm Baptise M.D.   On: Nov 10, 2018 02:19    Pending Labs Unresulted Labs (From admission, onward)    Start     Ordered   10-Nov-2018 0057  Lactic acid, plasma  Now then every 2 hours,   STAT     2018-11-10 0057   11/10/18 0057  Blood Culture (routine x 2)  BLOOD CULTURE X 2,   STAT     10-Nov-2018 0057   11/10/2018 0057  Fibrin derivatives D-Dimer  ONCE - STAT,   STAT    Comments: Used for prognosis and bed placement. Do not order CT or V/Q.   2018-11-10 0057   2018/11/10 0057  Procalcitonin  ONCE - STAT,   STAT     11-10-2018 0057   11/10/18 0057  Triglycerides  Once,   STAT     2018-11-10 0057   11-10-2018 0057  C-reactive protein  Once,   STAT     11-10-18 0057          Vitals/Pain Today's Vitals   11/10/18 0230 2018-11-10 0245 2018/11/10 0300 2018/11/10 0315  BP: (!) 148/114  (!) 87/48   Pulse: 92 93 91 91  Resp: 18 (!) 25 (!) 39 19  Temp:      TempSrc:      SpO2: 96% 97% 100% 100%  Weight:      Height:      PainSc:        Isolation Precautions Airborne and Contact precautions  Medications Medications  morphine 100mg  in NS 112mL (1mg /mL) infusion - premix (5 mg/hr Intravenous New Bag/Given 10-Nov-2018 0318)  sodium chloride 0.9 % bolus 1,000 mL (1,000 mLs Intravenous New Bag/Given 2018/11/10 0317)    Mobility non-ambulatory High fall risk   Focused Assessments Pt is presently a hospice pt, covid positive   R Recommendations: See Admitting Provider Note  Report given to:   Additional Notes:

## 2018-11-25 NOTE — ED Triage Notes (Signed)
Patient arrived unconscious and breathing heavily. Per EMS patient was to go to [facility] but was brought here due to being unstable for transport.

## 2018-11-25 NOTE — Progress Notes (Addendum)
Patient brought to room 172 for family visit with patient daughter from approximately 1320-1350. MD paged to notify of visitation in progress.  Daughter facetimed with other family members while in room. Emotional support given to family member present. Patient watch placed in biohazard bag, wiped off and given to daughter to take home.

## 2018-11-25 NOTE — Progress Notes (Signed)
85 ml of morphine wasted down stericycle with Adonis Brook.

## 2018-11-25 NOTE — Plan of Care (Signed)
[place] doc note:  85 yo M hospice patient who is actively dying from COVID-19.  I spoke with Dr. Silvio Pate earlier this evening and I accepted him as direct admit to [facility] to initiate comfort measures and palliative care (ie morphine gtt).  Apparently they couldn't start this at his facility.  I was later informed by Dr. Owens Shark (EDP at [facility]) that [facility] felt patient was too unstable to make it to [facility] and instead they brought him to [facility] ED.  I let the EDP know about the above plan and if patient can be stabilized for transport on an NRB we would be more than happy to take him here.  Patient started on morphine gtt for comfort at [facility] ED.  Family who was here at [facility] waiting for patient, was informed, and left for [facility] to try and visit patient there.

## 2018-11-25 DEATH — deceased

## 2019-04-07 ENCOUNTER — Ambulatory Visit (INDEPENDENT_AMBULATORY_CARE_PROVIDER_SITE_OTHER): Payer: Medicare Other | Admitting: Vascular Surgery

## 2020-07-10 IMAGING — CT CT ABD-PELV W/ CM
2 of 5 series · 15 of 46 positions shown, 17 images · IV contrast (iopamidol)
Comparison: PET imaging 12/23/2016.  CT scan 12/03/2016.

CLINICAL DATA: Prostate cancer.  Elevated PSA

EXAM:
CT ABDOMEN AND PELVIS WITH CONTRAST
TECHNIQUE: Multidetector CT imaging of the abdomen and pelvis was performed
using the standard protocol following bolus administration of
intravenous contrast.
CONTRAST:  100mL [id number] IOPAMIDOL ([id number]) INJECTION 61%

[Series 2: abd pelvis · axial · 0.70mm/px · z∈[-1690,-1285]mm · 12 of 91 slices shown, 14 images (1 of 2)]
[im 5/91  soft-tissue]
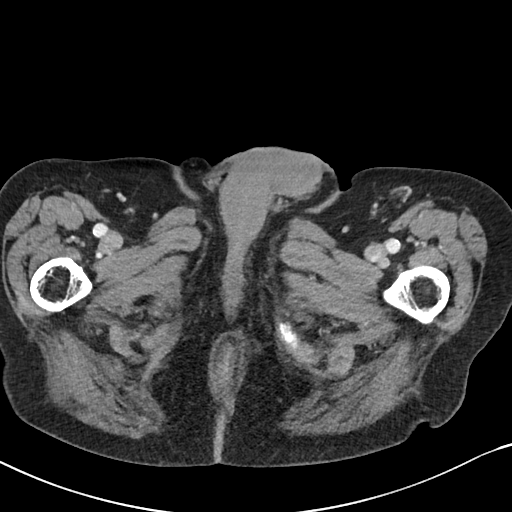
[im 5/91  bone]
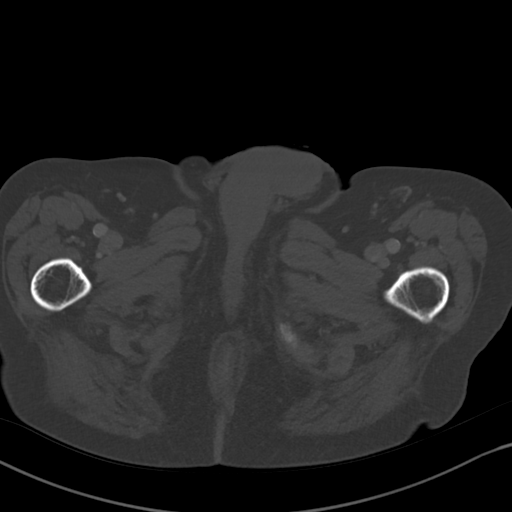
[im 15/91  soft-tissue]
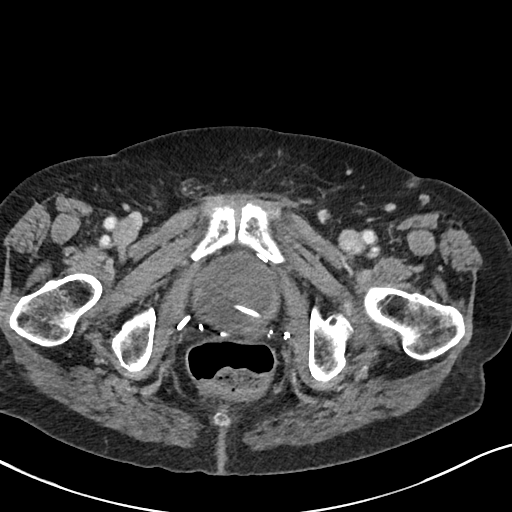
[im 19/91  soft-tissue]
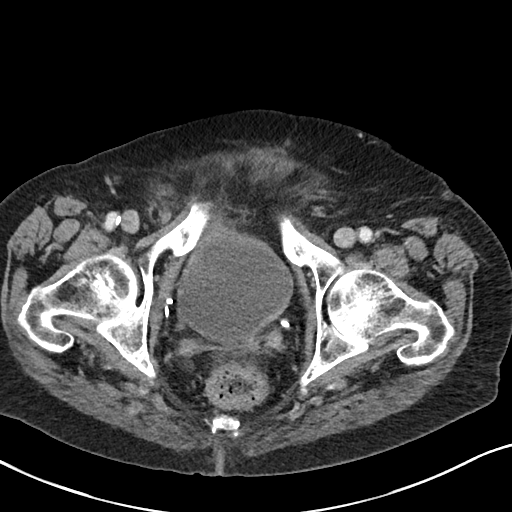
[im 29/91  soft-tissue]
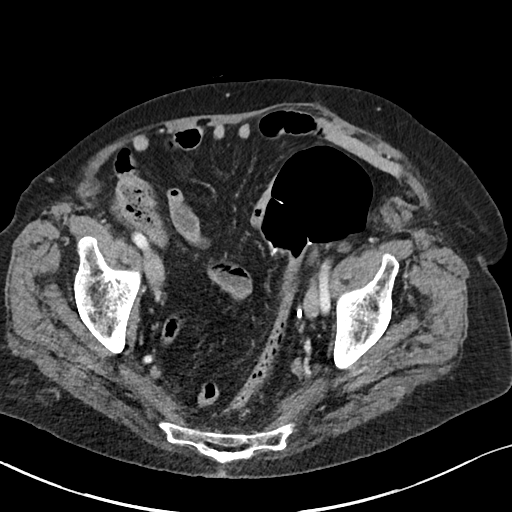
[im 34/91  soft-tissue]
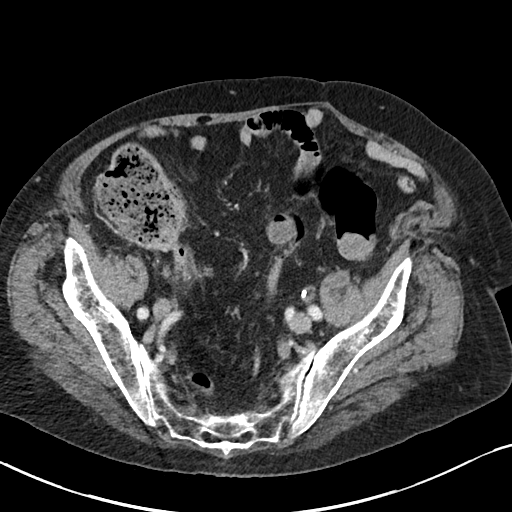
[im 43/91  soft-tissue]
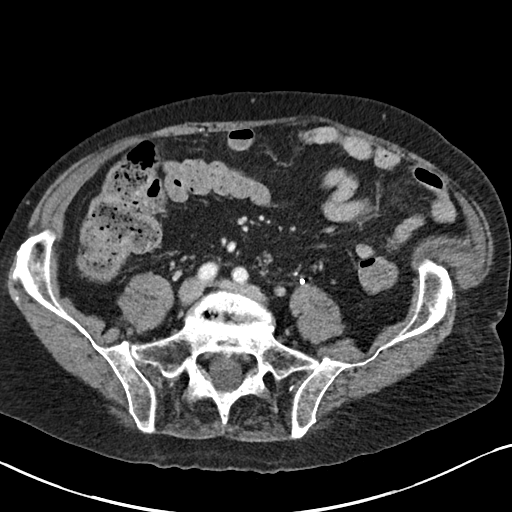
[im 48/91  soft-tissue]
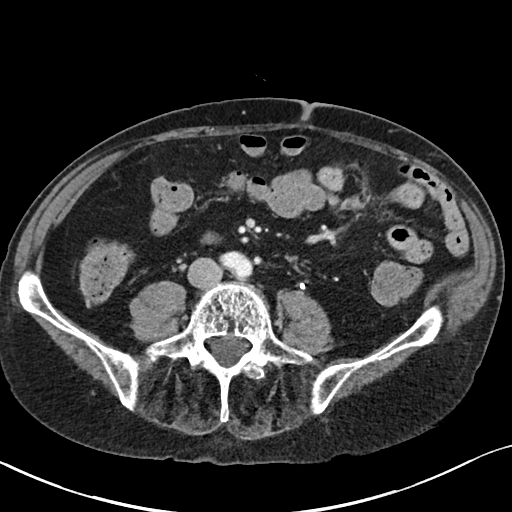
[im 57/91  soft-tissue]
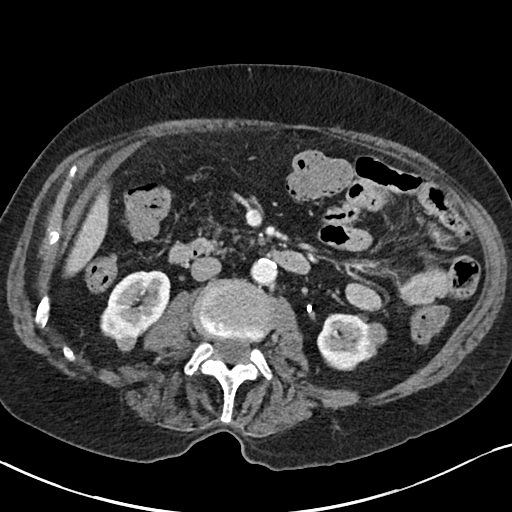
[im 62/91  soft-tissue]
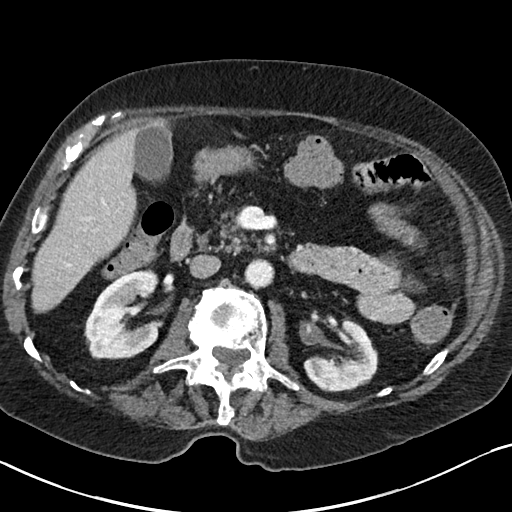
[im 62/91  bone]
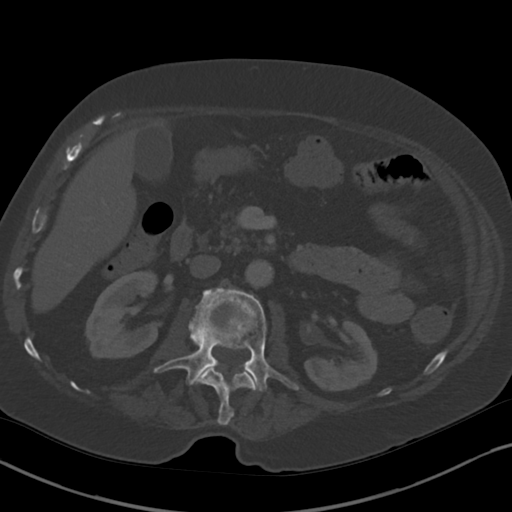
[im 72/91  soft-tissue]
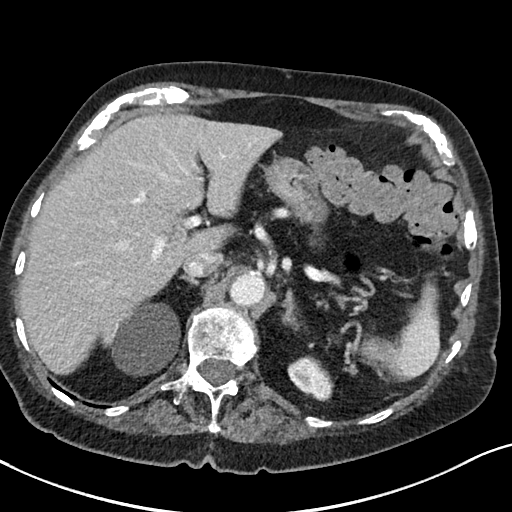
[im 76/91  soft-tissue]
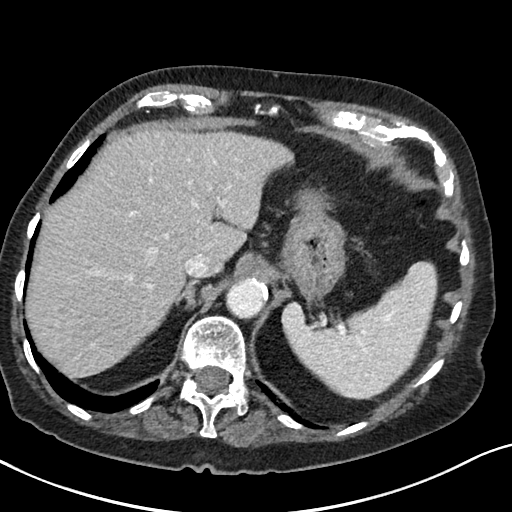
[im 86/91  soft-tissue]
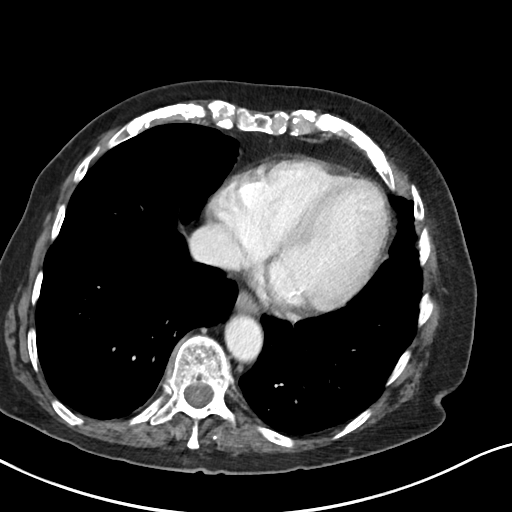

[Series 4: abd pelvis · coronal · 0.70mm/px · 3 of 146 slices shown (2 of 2)]
[im 49/146  soft-tissue]
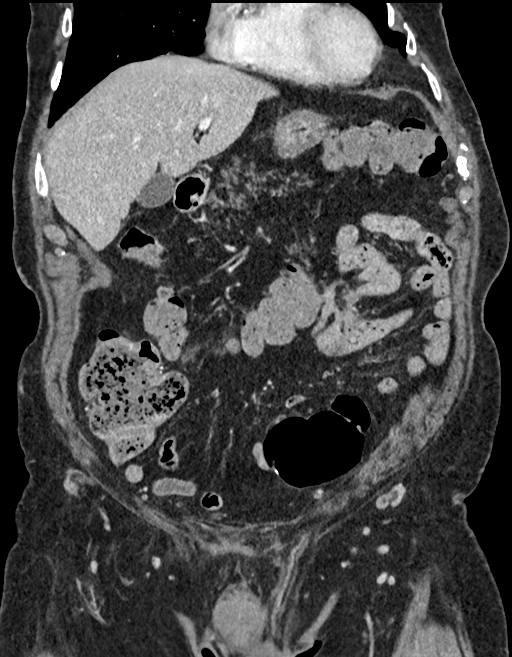
[im 65/146  soft-tissue]
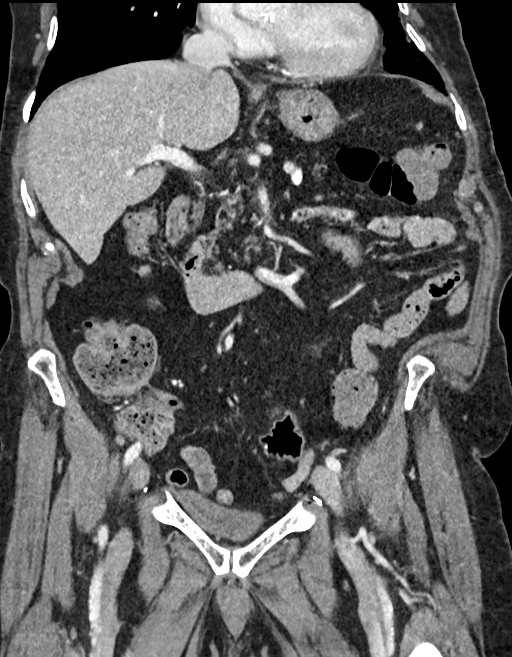
[im 81/146  soft-tissue]
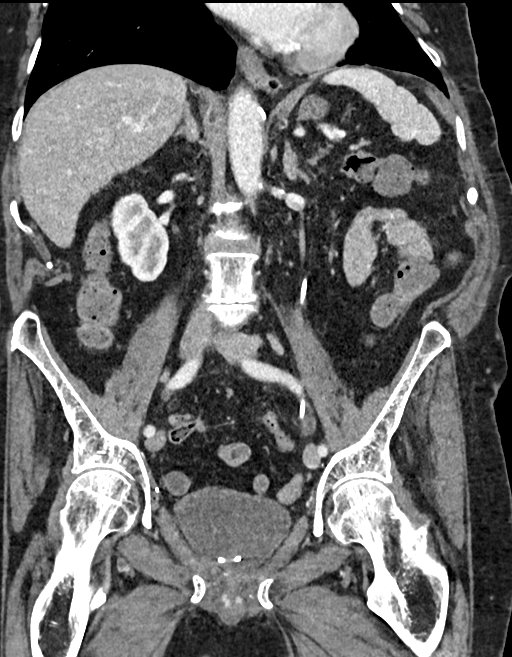

[15 of 46 positions shown; findings below may reference images not displayed]

FINDINGS: Lower chest: Emphysema

Hepatobiliary: No focal abnormality within the liver parenchyma.
There is no evidence for gallstones, gallbladder wall thickening, or
pericholecystic fluid. No intrahepatic or extrahepatic biliary
dilation.

Pancreas: No focal mass lesion. No dilatation of the main duct. No
intraparenchymal cyst. No peripancreatic edema.

Spleen: No splenomegaly. No focal mass lesion.

Adrenals/Urinary Tract: Similar appearance 9 x 20 mm left adrenal
nodule. 5.4 cm exophytic cyst upper pole right kidney. 14 mm
exophytic lesion lower pole right kidney ([DATE]) is similar in size
to prior studies but enhancement within the lesion is suggested on
today's exam. Diffuse cortical atrophy noted left kidney with stable
appearance of 15 mm exophytic lower pole lesion having attenuation
too high to be a simple cyst. No evidence for right hydroureter.
Left double-J internal ureteral stent is identified with proximal
loop formed in the renal pelvis and distal loop formed in the
bladder bladder unremarkable.

Stomach/Bowel: Tiny hiatal hernia. Stomach otherwise unremarkable.
Duodenum is normally positioned as is the ligament of Treitz. No
small bowel wall thickening. No small bowel dilatation. The terminal
ileum is normal. Staple line noted sigmoid colon.

Vascular/Lymphatic: There is abdominal aortic atherosclerosis
without aneurysm. There is no gastrohepatic or hepatoduodenal
ligament lymphadenopathy. No intraperitoneal or retroperitoneal
lymphadenopathy. There is abdominal aortic atherosclerosis without
aneurysm. The lymph nodes of concern on previous PET-CT have
decreased in size in the interval. The left para-aortic index lymph
node measured on the prior study at 11 mm short axis is now 4 mm
([DATE]).

Index lymph node described previously at the left iliac bifurcation
has decreased from 10 mm on that study to 4 mm short axis today
([DATE]).

Reproductive: Prostate gland surgically absent.

Other: No intraperitoneal free fluid.

Musculoskeletal: No fracture is seen. Stable L3 superior endplate
compression fracture. No worrisome lytic or sclerotic osseous
abnormality.
IMPRESSION: 1. Left para-aortic and iliac lymphadenopathy seen on prior studies
has resolved in the interval. No findings on today's exam to suggest
new or progressive metastases.
2.  Aortic Atherosclerois ([id number]-170.0)
